# Patient Record
Sex: Female | Born: 1945 | Race: White | Hispanic: No | Marital: Married | Smoking: Former smoker
Health system: Southern US, Community
[De-identification: ages and names within clinical notes are randomized; demographics above are authoritative.]

## PROBLEM LIST (undated history)

## (undated) DIAGNOSIS — I5032 Chronic diastolic (congestive) heart failure: Secondary | ICD-10-CM

## (undated) DIAGNOSIS — K439 Ventral hernia without obstruction or gangrene: Secondary | ICD-10-CM

## (undated) DIAGNOSIS — G459 Transient cerebral ischemic attack, unspecified: Secondary | ICD-10-CM

## (undated) DIAGNOSIS — M545 Low back pain, unspecified: Secondary | ICD-10-CM

## (undated) DIAGNOSIS — E042 Nontoxic multinodular goiter: Secondary | ICD-10-CM

## (undated) DIAGNOSIS — D696 Thrombocytopenia, unspecified: Secondary | ICD-10-CM

## (undated) DIAGNOSIS — Z8719 Personal history of other diseases of the digestive system: Secondary | ICD-10-CM

## (undated) DIAGNOSIS — IMO0002 Reserved for concepts with insufficient information to code with codable children: Secondary | ICD-10-CM

## (undated) DIAGNOSIS — K219 Gastro-esophageal reflux disease without esophagitis: Secondary | ICD-10-CM

## (undated) DIAGNOSIS — I10 Essential (primary) hypertension: Secondary | ICD-10-CM

## (undated) DIAGNOSIS — E039 Hypothyroidism, unspecified: Secondary | ICD-10-CM

## (undated) DIAGNOSIS — E669 Obesity, unspecified: Secondary | ICD-10-CM

## (undated) DIAGNOSIS — J449 Chronic obstructive pulmonary disease, unspecified: Secondary | ICD-10-CM

## (undated) DIAGNOSIS — E119 Type 2 diabetes mellitus without complications: Secondary | ICD-10-CM

## (undated) DIAGNOSIS — C50911 Malignant neoplasm of unspecified site of right female breast: Secondary | ICD-10-CM

## (undated) DIAGNOSIS — S82851A Displaced trimalleolar fracture of right lower leg, initial encounter for closed fracture: Secondary | ICD-10-CM

## (undated) DIAGNOSIS — K8020 Calculus of gallbladder without cholecystitis without obstruction: Secondary | ICD-10-CM

## (undated) DIAGNOSIS — F329 Major depressive disorder, single episode, unspecified: Secondary | ICD-10-CM

## (undated) DIAGNOSIS — R251 Tremor, unspecified: Secondary | ICD-10-CM

## (undated) DIAGNOSIS — D3500 Benign neoplasm of unspecified adrenal gland: Secondary | ICD-10-CM

## (undated) DIAGNOSIS — K589 Irritable bowel syndrome without diarrhea: Secondary | ICD-10-CM

## (undated) DIAGNOSIS — G43909 Migraine, unspecified, not intractable, without status migrainosus: Secondary | ICD-10-CM

## (undated) DIAGNOSIS — E662 Morbid (severe) obesity with alveolar hypoventilation: Secondary | ICD-10-CM

## (undated) DIAGNOSIS — M797 Fibromyalgia: Secondary | ICD-10-CM

## (undated) DIAGNOSIS — M109 Gout, unspecified: Secondary | ICD-10-CM

## (undated) DIAGNOSIS — E66812 Obesity, class 2: Secondary | ICD-10-CM

## (undated) DIAGNOSIS — D649 Anemia, unspecified: Secondary | ICD-10-CM

## (undated) DIAGNOSIS — Z8701 Personal history of pneumonia (recurrent): Secondary | ICD-10-CM

## (undated) DIAGNOSIS — M1990 Unspecified osteoarthritis, unspecified site: Secondary | ICD-10-CM

## (undated) DIAGNOSIS — E785 Hyperlipidemia, unspecified: Secondary | ICD-10-CM

## (undated) DIAGNOSIS — F32A Depression, unspecified: Secondary | ICD-10-CM

## (undated) DIAGNOSIS — F419 Anxiety disorder, unspecified: Secondary | ICD-10-CM

## (undated) DIAGNOSIS — R609 Edema, unspecified: Secondary | ICD-10-CM

## (undated) DIAGNOSIS — G8929 Other chronic pain: Secondary | ICD-10-CM

## (undated) HISTORY — DX: Obesity, unspecified: E66.9

## (undated) HISTORY — DX: Major depressive disorder, single episode, unspecified: F32.9

## (undated) HISTORY — DX: Hyperlipidemia, unspecified: E78.5

## (undated) HISTORY — DX: Personal history of pneumonia (recurrent): Z87.01

## (undated) HISTORY — DX: Anxiety disorder, unspecified: F41.9

## (undated) HISTORY — PX: LUMBAR LAMINECTOMY: SHX95

## (undated) HISTORY — PX: VAGINAL HYSTERECTOMY: SUR661

## (undated) HISTORY — DX: Chronic diastolic (congestive) heart failure: I50.32

## (undated) HISTORY — DX: Morbid (severe) obesity with alveolar hypoventilation: E66.2

## (undated) HISTORY — PX: CARDIAC CATHETERIZATION: SHX172

## (undated) HISTORY — PX: FRACTURE SURGERY: SHX138

## (undated) HISTORY — DX: Hypothyroidism, unspecified: E03.9

## (undated) HISTORY — DX: Displaced trimalleolar fracture of right lower leg, initial encounter for closed fracture: S82.851A

## (undated) HISTORY — PX: BACK SURGERY: SHX140

## (undated) HISTORY — DX: Nontoxic multinodular goiter: E04.2

## (undated) HISTORY — DX: Depression, unspecified: F32.A

## (undated) HISTORY — DX: Obesity, class 2: E66.812

## (undated) HISTORY — DX: Thrombocytopenia, unspecified: D69.6

---

## 1989-03-23 HISTORY — PX: COMBINED HYSTERECTOMY VAGINAL / OOPHORECTOMY / A&P REPAIR / SACROSPINOUS LIGAMENT SUSPENSION: SUR295

## 2006-04-23 DIAGNOSIS — C50911 Malignant neoplasm of unspecified site of right female breast: Secondary | ICD-10-CM

## 2006-04-23 HISTORY — PX: BREAST LUMPECTOMY: SHX2

## 2006-04-23 HISTORY — DX: Malignant neoplasm of unspecified site of right female breast: C50.911

## 2006-04-23 HISTORY — PX: BREAST BIOPSY: SHX20

## 2011-08-22 HISTORY — PX: ORIF ANKLE FRACTURE BIMALLEOLAR: SUR920

## 2013-04-23 DIAGNOSIS — I5032 Chronic diastolic (congestive) heart failure: Secondary | ICD-10-CM

## 2013-04-23 DIAGNOSIS — D649 Anemia, unspecified: Secondary | ICD-10-CM

## 2013-04-23 HISTORY — DX: Chronic diastolic (congestive) heart failure: I50.32

## 2013-04-23 HISTORY — DX: Anemia, unspecified: D64.9

## 2013-08-21 HISTORY — PX: CATARACT EXTRACTION W/ INTRAOCULAR LENS IMPLANT: SHX1309

## 2013-09-28 ENCOUNTER — Encounter (HOSPITAL_COMMUNITY): Payer: Self-pay | Admitting: Emergency Medicine

## 2013-09-28 ENCOUNTER — Observation Stay (HOSPITAL_COMMUNITY)
Admission: EM | Admit: 2013-09-28 | Discharge: 2013-09-29 | Disposition: A | Discharge status: Home / Self Care | Payer: Medicare (Managed Care) | Attending: Internal Medicine | Admitting: Internal Medicine

## 2013-09-28 ENCOUNTER — Emergency Department (HOSPITAL_COMMUNITY): Payer: Medicare (Managed Care)

## 2013-09-28 ENCOUNTER — Inpatient Hospital Stay (HOSPITAL_COMMUNITY): Payer: Medicare (Managed Care)

## 2013-09-28 DIAGNOSIS — I10 Essential (primary) hypertension: Secondary | ICD-10-CM | POA: Insufficient documentation

## 2013-09-28 DIAGNOSIS — E119 Type 2 diabetes mellitus without complications: Secondary | ICD-10-CM

## 2013-09-28 DIAGNOSIS — Y92009 Unspecified place in unspecified non-institutional (private) residence as the place of occurrence of the external cause: Secondary | ICD-10-CM | POA: Insufficient documentation

## 2013-09-28 DIAGNOSIS — Z7982 Long term (current) use of aspirin: Secondary | ICD-10-CM | POA: Insufficient documentation

## 2013-09-28 DIAGNOSIS — M1990 Unspecified osteoarthritis, unspecified site: Secondary | ICD-10-CM | POA: Diagnosis not present

## 2013-09-28 DIAGNOSIS — W19XXXA Unspecified fall, initial encounter: Secondary | ICD-10-CM | POA: Diagnosis not present

## 2013-09-28 DIAGNOSIS — G319 Degenerative disease of nervous system, unspecified: Secondary | ICD-10-CM | POA: Insufficient documentation

## 2013-09-28 DIAGNOSIS — Z87891 Personal history of nicotine dependence: Secondary | ICD-10-CM | POA: Insufficient documentation

## 2013-09-28 DIAGNOSIS — Z7902 Long term (current) use of antithrombotics/antiplatelets: Secondary | ICD-10-CM | POA: Insufficient documentation

## 2013-09-28 DIAGNOSIS — F3289 Other specified depressive episodes: Secondary | ICD-10-CM | POA: Diagnosis not present

## 2013-09-28 DIAGNOSIS — R259 Unspecified abnormal involuntary movements: Secondary | ICD-10-CM | POA: Insufficient documentation

## 2013-09-28 DIAGNOSIS — R29898 Other symptoms and signs involving the musculoskeletal system: Principal | ICD-10-CM | POA: Insufficient documentation

## 2013-09-28 DIAGNOSIS — E785 Hyperlipidemia, unspecified: Secondary | ICD-10-CM | POA: Diagnosis not present

## 2013-09-28 DIAGNOSIS — F411 Generalized anxiety disorder: Secondary | ICD-10-CM | POA: Diagnosis not present

## 2013-09-28 DIAGNOSIS — Z9981 Dependence on supplemental oxygen: Secondary | ICD-10-CM | POA: Insufficient documentation

## 2013-09-28 DIAGNOSIS — Z853 Personal history of malignant neoplasm of breast: Secondary | ICD-10-CM | POA: Diagnosis not present

## 2013-09-28 DIAGNOSIS — K589 Irritable bowel syndrome without diarrhea: Secondary | ICD-10-CM | POA: Diagnosis not present

## 2013-09-28 DIAGNOSIS — E039 Hypothyroidism, unspecified: Secondary | ICD-10-CM | POA: Diagnosis not present

## 2013-09-28 DIAGNOSIS — IMO0002 Reserved for concepts with insufficient information to code with codable children: Secondary | ICD-10-CM | POA: Insufficient documentation

## 2013-09-28 DIAGNOSIS — G8929 Other chronic pain: Secondary | ICD-10-CM | POA: Diagnosis not present

## 2013-09-28 DIAGNOSIS — Z9181 History of falling: Secondary | ICD-10-CM | POA: Insufficient documentation

## 2013-09-28 DIAGNOSIS — R5383 Other fatigue: Secondary | ICD-10-CM | POA: Diagnosis present

## 2013-09-28 DIAGNOSIS — J449 Chronic obstructive pulmonary disease, unspecified: Secondary | ICD-10-CM | POA: Diagnosis not present

## 2013-09-28 DIAGNOSIS — IMO0001 Reserved for inherently not codable concepts without codable children: Secondary | ICD-10-CM | POA: Insufficient documentation

## 2013-09-28 DIAGNOSIS — J4489 Other specified chronic obstructive pulmonary disease: Secondary | ICD-10-CM | POA: Insufficient documentation

## 2013-09-28 DIAGNOSIS — K8020 Calculus of gallbladder without cholecystitis without obstruction: Secondary | ICD-10-CM | POA: Insufficient documentation

## 2013-09-28 DIAGNOSIS — R531 Weakness: Secondary | ICD-10-CM

## 2013-09-28 DIAGNOSIS — Z794 Long term (current) use of insulin: Secondary | ICD-10-CM | POA: Insufficient documentation

## 2013-09-28 DIAGNOSIS — M5090 Cervical disc disorder, unspecified, unspecified cervical region: Secondary | ICD-10-CM

## 2013-09-28 DIAGNOSIS — F329 Major depressive disorder, single episode, unspecified: Secondary | ICD-10-CM | POA: Diagnosis not present

## 2013-09-28 DIAGNOSIS — M797 Fibromyalgia: Secondary | ICD-10-CM

## 2013-09-28 DIAGNOSIS — R5381 Other malaise: Secondary | ICD-10-CM | POA: Insufficient documentation

## 2013-09-28 HISTORY — DX: Chronic obstructive pulmonary disease, unspecified: J44.9

## 2013-09-28 HISTORY — DX: Anemia, unspecified: D64.9

## 2013-09-28 HISTORY — DX: Other chronic pain: G89.29

## 2013-09-28 HISTORY — DX: Benign neoplasm of unspecified adrenal gland: D35.00

## 2013-09-28 HISTORY — DX: Gastro-esophageal reflux disease without esophagitis: K21.9

## 2013-09-28 HISTORY — DX: Unspecified osteoarthritis, unspecified site: M19.90

## 2013-09-28 HISTORY — DX: Low back pain: M54.5

## 2013-09-28 HISTORY — DX: Type 2 diabetes mellitus without complications: E11.9

## 2013-09-28 HISTORY — DX: Fibromyalgia: M79.7

## 2013-09-28 HISTORY — DX: Ventral hernia without obstruction or gangrene: K43.9

## 2013-09-28 HISTORY — DX: Calculus of gallbladder without cholecystitis without obstruction: K80.20

## 2013-09-28 HISTORY — DX: Low back pain, unspecified: M54.50

## 2013-09-28 HISTORY — DX: Tremor, unspecified: R25.1

## 2013-09-28 HISTORY — DX: Transient cerebral ischemic attack, unspecified: G45.9

## 2013-09-28 HISTORY — DX: Edema, unspecified: R60.9

## 2013-09-28 HISTORY — DX: Gout, unspecified: M10.9

## 2013-09-28 HISTORY — DX: Personal history of other diseases of the digestive system: Z87.19

## 2013-09-28 HISTORY — DX: Irritable bowel syndrome, unspecified: K58.9

## 2013-09-28 HISTORY — DX: Migraine, unspecified, not intractable, without status migrainosus: G43.909

## 2013-09-28 HISTORY — DX: Essential (primary) hypertension: I10

## 2013-09-28 HISTORY — DX: Reserved for concepts with insufficient information to code with codable children: IMO0002

## 2013-09-28 LAB — CBC WITH DIFFERENTIAL/PLATELET
Basophils Absolute: 0 10*3/uL (ref 0.0–0.1)
Basophils Relative: 0 % (ref 0–1)
Eosinophils Absolute: 0 10*3/uL (ref 0.0–0.7)
Eosinophils Relative: 0 % (ref 0–5)
HEMATOCRIT: 42.8 % (ref 36.0–46.0)
HEMOGLOBIN: 13.6 g/dL (ref 12.0–15.0)
LYMPHS PCT: 8 % — AB (ref 12–46)
Lymphs Abs: 1.1 10*3/uL (ref 0.7–4.0)
MCH: 26.8 pg (ref 26.0–34.0)
MCHC: 31.8 g/dL (ref 30.0–36.0)
MCV: 84.3 fL (ref 78.0–100.0)
MONO ABS: 0.7 10*3/uL (ref 0.1–1.0)
MONOS PCT: 5 % (ref 3–12)
Neutro Abs: 11.6 10*3/uL — ABNORMAL HIGH (ref 1.7–7.7)
Neutrophils Relative %: 87 % — ABNORMAL HIGH (ref 43–77)
Platelets: 159 10*3/uL (ref 150–400)
RBC: 5.08 MIL/uL (ref 3.87–5.11)
RDW: 14.4 % (ref 11.5–15.5)
WBC: 13.4 10*3/uL — ABNORMAL HIGH (ref 4.0–10.5)

## 2013-09-28 LAB — GLUCOSE, CAPILLARY
GLUCOSE-CAPILLARY: 106 mg/dL — AB (ref 70–99)
GLUCOSE-CAPILLARY: 121 mg/dL — AB (ref 70–99)

## 2013-09-28 LAB — URINALYSIS, ROUTINE W REFLEX MICROSCOPIC
Bilirubin Urine: NEGATIVE
GLUCOSE, UA: NEGATIVE mg/dL
Hgb urine dipstick: NEGATIVE
Ketones, ur: NEGATIVE mg/dL
Nitrite: NEGATIVE
PH: 5.5 (ref 5.0–8.0)
Protein, ur: NEGATIVE mg/dL
SPECIFIC GRAVITY, URINE: 1.015 (ref 1.005–1.030)
Urobilinogen, UA: 0.2 mg/dL (ref 0.0–1.0)

## 2013-09-28 LAB — COMPREHENSIVE METABOLIC PANEL
ALT: 75 U/L — ABNORMAL HIGH (ref 0–35)
AST: 46 U/L — AB (ref 0–37)
Albumin: 3.1 g/dL — ABNORMAL LOW (ref 3.5–5.2)
Alkaline Phosphatase: 205 U/L — ABNORMAL HIGH (ref 39–117)
BILIRUBIN TOTAL: 0.3 mg/dL (ref 0.3–1.2)
BUN: 44 mg/dL — AB (ref 6–23)
CHLORIDE: 97 meq/L (ref 96–112)
CO2: 26 meq/L (ref 19–32)
CREATININE: 1.17 mg/dL — AB (ref 0.50–1.10)
Calcium: 9.3 mg/dL (ref 8.4–10.5)
GFR, EST AFRICAN AMERICAN: 54 mL/min — AB (ref >90)
GFR, EST NON AFRICAN AMERICAN: 46 mL/min — AB (ref >90)
Glucose, Bld: 198 mg/dL — ABNORMAL HIGH (ref 70–99)
Potassium: 5.2 mEq/L (ref 3.7–5.3)
Sodium: 138 mEq/L (ref 137–147)
Total Protein: 6.9 g/dL (ref 6.0–8.3)

## 2013-09-28 LAB — URINE MICROSCOPIC-ADD ON

## 2013-09-28 LAB — HEMOGLOBIN A1C
Hgb A1c MFr Bld: 6.6 % — ABNORMAL HIGH (ref <5.7)
MEAN PLASMA GLUCOSE: 143 mg/dL — AB (ref <117)

## 2013-09-28 LAB — PRO B NATRIURETIC PEPTIDE: PRO B NATRI PEPTIDE: 356.8 pg/mL — AB (ref 0–125)

## 2013-09-28 LAB — TROPONIN I

## 2013-09-28 MED ORDER — HEPARIN SODIUM (PORCINE) 5000 UNIT/ML IJ SOLN
5000.0000 [IU] | Freq: Three times a day (TID) | INTRAMUSCULAR | Status: DC
  Administered 2013-09-28 – 2013-09-29 (×3): 5000 [IU] via SUBCUTANEOUS
  Filled 2013-09-28 (×5): qty 1

## 2013-09-28 MED ORDER — TRAMADOL HCL 50 MG PO TABS
100.0000 mg | ORAL_TABLET | Freq: Four times a day (QID) | ORAL | Status: DC
  Administered 2013-09-29 (×2): 100 mg via ORAL
  Filled 2013-09-28 (×3): qty 2

## 2013-09-28 MED ORDER — LISINOPRIL 10 MG PO TABS
10.0000 mg | ORAL_TABLET | Freq: Two times a day (BID) | ORAL | Status: DC
  Administered 2013-09-28 – 2013-09-29 (×2): 10 mg via ORAL
  Filled 2013-09-28 (×3): qty 1

## 2013-09-28 MED ORDER — TIOTROPIUM BROMIDE MONOHYDRATE 18 MCG IN CAPS
18.0000 ug | ORAL_CAPSULE | Freq: Every day | RESPIRATORY_TRACT | Status: DC
  Administered 2013-09-29: 18 ug via RESPIRATORY_TRACT
  Filled 2013-09-28: qty 5

## 2013-09-28 MED ORDER — ALPRAZOLAM 0.5 MG PO TABS: 0.5000 mg | ORAL_TABLET | Freq: Three times a day (TID) | ORAL | Status: DC | PRN

## 2013-09-28 MED ORDER — ATORVASTATIN CALCIUM 40 MG PO TABS
40.0000 mg | ORAL_TABLET | Freq: Every day | ORAL | Status: DC
  Administered 2013-09-28: 40 mg via ORAL
  Filled 2013-09-28 (×2): qty 1

## 2013-09-28 MED ORDER — INSULIN GLARGINE 100 UNIT/ML ~~LOC~~ SOLN
15.0000 [IU] | Freq: Every day | SUBCUTANEOUS | Status: DC
  Administered 2013-09-28: 15 [IU] via SUBCUTANEOUS
  Filled 2013-09-28 (×2): qty 0.15

## 2013-09-28 MED ORDER — MORPHINE SULFATE ER 30 MG PO TBCR
30.0000 mg | EXTENDED_RELEASE_TABLET | Freq: Two times a day (BID) | ORAL | Status: DC
  Administered 2013-09-28 – 2013-09-29 (×2): 30 mg via ORAL
  Filled 2013-09-28 (×2): qty 1

## 2013-09-28 MED ORDER — METOCLOPRAMIDE HCL 10 MG PO TABS
10.0000 mg | ORAL_TABLET | Freq: Three times a day (TID) | ORAL | Status: DC
  Administered 2013-09-28 – 2013-09-29 (×2): 10 mg via ORAL
  Filled 2013-09-28 (×4): qty 1

## 2013-09-28 MED ORDER — CLOPIDOGREL BISULFATE 75 MG PO TABS
75.0000 mg | ORAL_TABLET | Freq: Every day | ORAL | Status: DC
  Administered 2013-09-29: 75 mg via ORAL
  Filled 2013-09-28 (×2): qty 1

## 2013-09-28 MED ORDER — ASPIRIN EC 81 MG PO TBEC
81.0000 mg | DELAYED_RELEASE_TABLET | Freq: Every day | ORAL | Status: DC
  Administered 2013-09-28 – 2013-09-29 (×2): 81 mg via ORAL
  Filled 2013-09-28 (×2): qty 1

## 2013-09-28 MED ORDER — INSULIN ASPART 100 UNIT/ML ~~LOC~~ SOLN
0.0000 [IU] | Freq: Three times a day (TID) | SUBCUTANEOUS | Status: DC
Start: 2013-09-29 — End: 2013-09-29
  Administered 2013-09-29 (×2): 2 [IU] via SUBCUTANEOUS

## 2013-09-28 MED ORDER — LEVOTHYROXINE SODIUM 100 MCG PO TABS
100.0000 ug | ORAL_TABLET | Freq: Every day | ORAL | Status: DC
  Administered 2013-09-29: 100 ug via ORAL
  Filled 2013-09-28 (×2): qty 1

## 2013-09-28 MED ORDER — LETROZOLE 2.5 MG PO TABS
2.5000 mg | ORAL_TABLET | Freq: Every day | ORAL | Status: DC
  Administered 2013-09-28 – 2013-09-29 (×2): 2.5 mg via ORAL
  Filled 2013-09-28 (×2): qty 1

## 2013-09-28 MED ORDER — PREGABALIN 75 MG PO CAPS
75.0000 mg | ORAL_CAPSULE | Freq: Four times a day (QID) | ORAL | Status: DC
  Administered 2013-09-28 – 2013-09-29 (×3): 75 mg via ORAL
  Filled 2013-09-28 (×3): qty 1

## 2013-09-28 MED ORDER — FUROSEMIDE 80 MG PO TABS
160.0000 mg | ORAL_TABLET | Freq: Every morning | ORAL | Status: DC
  Administered 2013-09-29: 160 mg via ORAL
  Filled 2013-09-28: qty 2

## 2013-09-28 MED ORDER — ROPINIROLE HCL 1 MG PO TABS
2.0000 mg | ORAL_TABLET | Freq: Every day | ORAL | Status: DC
  Administered 2013-09-28: 2 mg via ORAL
  Filled 2013-09-28 (×2): qty 2

## 2013-09-28 MED ORDER — SERTRALINE HCL 50 MG PO TABS
50.0000 mg | ORAL_TABLET | Freq: Every day | ORAL | Status: DC
  Administered 2013-09-28 – 2013-09-29 (×2): 50 mg via ORAL
  Filled 2013-09-28 (×2): qty 1

## 2013-09-28 MED ORDER — SODIUM CHLORIDE 0.9 % IV SOLN
INTRAVENOUS | Status: DC
  Administered 2013-09-28: 14:00:00 via INTRAVENOUS

## 2013-09-28 NOTE — Progress Notes (Signed)
Pt admitted to the unit at 5:26pm from the ED. Pt is A&O x 4 . Pt oriented to room, staff, and call bell. Skin is intact. Full assessment charted in [facility]. Call bell within reach. Visitor guidelines reviewed w/ pt and/or family.

## 2013-09-28 NOTE — ED Provider Notes (Signed)
CSN: [id number]     Arrival date & time 09/28/13  1155 History   First MD Initiated Contact with Patient 09/28/13 1234     Chief Complaint  Patient presents with  . Weakness     (Consider location/radiation/quality/duration/timing/severity/associated sxs/prior Treatment) Patient is a 68 y.o. female presenting with weakness. The history is provided by the patient and the spouse.  Weakness   patient with acute onset of whole-body weakness that began with this morning when she awoke. Patient went to stand up and had to be helped to the ground. She denies any headache or blurred vision. No recent illnesses. She notes that she has upper extremity tremors. Denies feeling dizzy.Marland Kitchen No cough or shortness of breath. Denies any chest pain. No abdominal pain. No confusion or slurred speech noted. Patient's symptoms are persistent and nothing makes it better or worse. No treatment used prior to arrival.  Past Medical History  Diagnosis Date  . COPD (chronic obstructive pulmonary disease)   . Fibromyalgia   . Diabetes mellitus without complication   . Degenerative disc disease   . IBS (irritable bowel syndrome)   . Edema   . Osteoarthritis (arthritis due to wear and tear of joints)   . Gallstones   . Hernia of abdominal wall    Past Surgical History  Procedure Laterality Date  . Abdominal hysterectomy    . Laminectomy    . Broken ankle     History reviewed. No pertinent family history. History  Substance Use Topics  . Smoking status: Never Smoker   . Smokeless tobacco: Not on file  . Alcohol Use: No   OB History   Grav Para Term Preterm Abortions TAB SAB Ect Mult Living                 Review of Systems  Neurological: Positive for weakness.  All other systems reviewed and are negative.     Allergies  Clindamycin/lincomycin; Fentanyl; Indocin; Keflex; Penicillins; and Vancomycin  Home Medications   Prior to Admission medications   Medication Sig Start Date End Date Taking?  Authorizing Provider  ALPRAZolam Duanne Moron) 0.5 MG tablet Take 0.5 mg by mouth 3 (three) times daily as needed for anxiety or sleep.   Yes Historical Provider, MD  aspirin EC 81 MG tablet Take 81 mg by mouth daily.   Yes Historical Provider, MD  atorvastatin (LIPITOR) 40 MG tablet Take 40 mg by mouth daily.   Yes Historical Provider, MD  calcium gluconate 500 MG tablet Take 2 tablets by mouth daily.   Yes Historical Provider, MD  Cholecalciferol (VITAMIN D-3 PO) Take 1 tablet by mouth daily.   Yes Historical Provider, MD  clopidogrel (PLAVIX) 75 MG tablet Take 75 mg by mouth daily.   Yes Historical Provider, MD  ferrous sulfate 325 (65 FE) MG tablet Take 325 mg by mouth daily with breakfast.   Yes Historical Provider, MD  furosemide (LASIX) 80 MG tablet Take 160 mg by mouth every morning.   Yes Historical Provider, MD  ibuprofen (ADVIL,MOTRIN) 200 MG tablet Take 400 mg by mouth every 4 (four) hours as needed (for pain).   Yes Historical Provider, MD  insulin aspart (NOVOLOG) 100 UNIT/ML injection Inject 10 Units into the skin 3 (three) times daily before meals.   Yes Historical Provider, MD  insulin glargine (LANTUS) 100 UNIT/ML injection Inject 43 Units into the skin 2 (two) times daily.   Yes Historical Provider, MD  letrozole (FEMARA) 2.5 MG tablet Take 2.5 mg by mouth  daily.   Yes Historical Provider, MD  levothyroxine (SYNTHROID, LEVOTHROID) 100 MCG tablet Take 100 mcg by mouth daily before breakfast.   Yes Historical Provider, MD  lisinopril (PRINIVIL,ZESTRIL) 10 MG tablet Take 10 mg by mouth 2 (two) times daily.   Yes Historical Provider, MD  Magnesium Oxide 500 MG (LAX) TABS Take 500 mg by mouth 2 (two) times daily.   Yes Historical Provider, MD  metoCLOPramide (REGLAN) 10 MG tablet Take 10 mg by mouth 3 (three) times daily.   Yes Historical Provider, MD  morphine (MS CONTIN) 30 MG 12 hr tablet Take 30 mg by mouth every 12 (twelve) hours.   Yes Historical Provider, MD  Multiple Vitamin  (MULTIVITAMIN WITH MINERALS) TABS tablet Take 1 tablet by mouth daily.   Yes Historical Provider, MD  potassium chloride SA (K-DUR,KLOR-CON) 20 MEQ tablet Take 20 mEq by mouth daily.   Yes Historical Provider, MD  pregabalin (LYRICA) 75 MG capsule Take 75 mg by mouth every 6 (six) hours.   Yes Historical Provider, MD  rOPINIRole (REQUIP) 1 MG tablet Take 2 mg by mouth at bedtime.   Yes Historical Provider, MD  sertraline (ZOLOFT) 50 MG tablet Take 50 mg by mouth daily.   Yes Historical Provider, MD  tiotropium (SPIRIVA) 18 MCG inhalation capsule Place 18 mcg into inhaler and inhale daily.   Yes Historical Provider, MD  traMADol (ULTRAM) 50 MG tablet Take 100 mg by mouth every 6 (six) hours.   Yes Historical Provider, MD   BP 127/46  Pulse 95  Temp(Src) 98.2 F (36.8 C) (Oral)  Resp 13  SpO2 96% Physical Exam  Nursing note and vitals reviewed. Constitutional: She is oriented to person, place, and time. She appears well-developed and well-nourished.  Non-toxic appearance. No distress.  HENT:  Head: Normocephalic and atraumatic.  Eyes: Conjunctivae, EOM and lids are normal. Pupils are equal, round, and reactive to light.  Neck: Normal range of motion. Neck supple. No tracheal deviation present. No mass present.  Cardiovascular: Normal rate, regular rhythm and normal heart sounds.  Exam reveals no gallop.   No murmur heard. Pulmonary/Chest: Effort normal and breath sounds normal. No stridor. No respiratory distress. She has no decreased breath sounds. She has no wheezes. She has no rhonchi. She has no rales.  Abdominal: Soft. Normal appearance and bowel sounds are normal. She exhibits no distension. There is no tenderness. There is no rebound and no CVA tenderness.  Musculoskeletal: Normal range of motion. She exhibits no edema and no tenderness.  Neurological: She is alert and oriented to person, place, and time. She has normal strength. She displays tremor. No cranial nerve deficit or sensory  deficit. Coordination normal. GCS eye subscore is 4. GCS verbal subscore is 5. GCS motor subscore is 6.  Skin: Skin is warm and dry. No abrasion and no rash noted.  Psychiatric: She has a normal mood and affect. Her speech is normal and behavior is normal.    ED Course  Procedures (including critical care time) Labs Review Labs Reviewed  URINE CULTURE  URINALYSIS, ROUTINE W REFLEX MICROSCOPIC  CBC WITH DIFFERENTIAL  COMPREHENSIVE METABOLIC PANEL  TROPONIN I  PRO B NATRIURETIC PEPTIDE    Imaging Review No results found.   EKG Interpretation   Date/Time:  Monday September 28 2013 12:09:47 EDT Ventricular Rate:  91 PR Interval:  146 QRS Duration: 86 QT Interval:  356 QTC Calculation: 438 R Axis:   -6 Text Interpretation:  Sinus or ectopic atrial rhythm Left ventricular  hypertrophy Anterior Q waves, possibly due to LVH Confirmed by Zenia Resides  MD,  Clarence Cogswell ([id number]) on 09/28/2013 12:34:12 PM      MDM   Final diagnoses:  None    Patient continues to note weakness. Will be admitted by medicine for further evaluation.   Leota Jacobsen, MD 09/28/13 [phone]

## 2013-09-28 NOTE — Progress Notes (Signed)
Pt. Placed on telemetry box 6 & CCM called.  Alphonzo Lemmings, RN

## 2013-09-28 NOTE — H&P (Signed)
Internal Medicine Attending Admission Note Date: 09/28/2013  Patient name: Brooke Mccullough Medical record number: [id number] Date of birth: [birth year] Age: 68 y.o. Gender: female  I saw and evaluated the patient. I reviewed the resident's note and I agree with the resident's findings and plan as documented in the resident's note.  Chief Complaint(s): fall * 1 episode  History - key components related to admission: Patient is a 68 y/o female with PMH of fibromyalgia, COPD on home O2, DM, IBS, gout, HTN, HLD who presents with fall * 1 episode. Pt states that she fell to the ground this morning while walking to the bathroom with her walker. She denies HA. No blurry vision, no LOC, no slurred speech, no tingling/numbness. Pt does complain of weakness of bilateral hands and also LE weakness R>L.  Pt has chronic back pain that is unchanged. No fevers/chills, no n/v, no abd pain. Pt does state that she has had weakness for the past couple of months which has now worsened. Remaining ROS negative   Physical Exam - key components related to admission:  Filed Vitals:   09/28/13 1652 09/28/13 1654 09/28/13 1746 09/28/13 2210  BP: 155/51 152/59 134/71 139/72  Pulse: 91 97 90 91  Temp:   98.3 F (36.8 C) 98 F (36.7 C)  TempSrc:   Oral Oral  Resp:   18 20  Height:   5\' 4"  (1.626 m)   Weight:   221 lb 3.2 oz (100.336 kg)   SpO2:   93% 94%  Cardio- RRR, normal heart sounds Lungs- CTA b/l Abd- firm, ventral hernia +, non tender, non distended, BS + Ext- 1 + b/l LE edema Gen - AAO*3, NAD Neuro- abnormal intermittent tremors of all 4 extremities intermittently, Power 3/5 RLE, 4/5 all other extremities   Lab results:   Basic Metabolic Panel:  Recent Labs  09/28/13 1304  NA 138  K 5.2  CL 97  CO2 26  GLUCOSE 198*  BUN 44*  CREATININE 1.17*  CALCIUM 9.3   Liver Function Tests:  Recent Labs  09/28/13 1304  AST 46*  ALT 75*  ALKPHOS 205*  BILITOT 0.3  PROT 6.9  ALBUMIN 3.1*    No results found for this basename: LIPASE, AMYLASE,  in the last 72 hours No results found for this basename: AMMONIA,  in the last 72 hours CBC:  Recent Labs  09/28/13 1304  WBC 13.4*  NEUTROABS 11.6*  HGB 13.6  HCT 42.8  MCV 84.3  PLT 159   Cardiac Enzymes:  Recent Labs  09/28/13 1304  TROPONINI <0.30   BNP: No components found with this basename: POCBNP,  D-Dimer: No results found for this basename: DDIMER,  in the last 72 hours CBG:  Recent Labs  09/28/13 1830  GLUCAP 106*   Hemoglobin A1C: No results found for this basename: HGBA1C,  in the last 72 hours Fasting Lipid Panel: No results found for this basename: CHOL, HDL, LDLCALC, TRIG, CHOLHDL, LDLDIRECT,  in the last 72 hours Thyroid Function Tests: No results found for this basename: TSH, T4TOTAL, FREET4, T3FREE, THYROIDAB,  in the last 72 hours Anemia Panel: No results found for this basename: VITAMINB12, FOLATE, FERRITIN, TIBC, IRON, RETICCTPCT,  in the last 72 hours Coagulation: No results found for this basename: INR,  in the last 72 hours Alcohol Level: No results found for this basename: ETH,  in the last 72 hours  Imaging results:  Dg Chest 2 View  09/28/2013   CLINICAL DATA:  Fall with  weakness.  EXAM: CHEST  2 VIEW  COMPARISON:  None.  FINDINGS: Patient is rotated. Trachea is grossly midline. Heart is enlarged. Marked right hemidiaphragm elevation obscures the lower right chest. Left lung is grossly clear. Osseous structures appear grossly intact.  IMPRESSION: 1. No definite acute findings. 2. Markedly elevated right hemidiaphragm.   Electronically Signed   By: [name] M.D.   On: 09/28/2013 13:54   Ct Head Wo Contrast  09/28/2013   CLINICAL DATA:  Fall.  Weakness.  Tremors.  EXAM: CT HEAD WITHOUT CONTRAST  TECHNIQUE: Contiguous axial images were obtained from the base of the skull through the vertex without intravenous contrast.  COMPARISON:  None.  FINDINGS: There is no evidence of  intracranial hemorrhage, brain edema, or other signs of acute infarction. There is no evidence of intracranial mass lesion or mass effect. No abnormal extraaxial fluid collections are identified.  Mild cerebral atrophy noted. No evidence of hydrocephalus. No evidence of skull fracture or other bone lesions.  IMPRESSION: No acute intracranial abnormality.  Mild cerebral atrophy.   Electronically Signed   By: [name] M.D.   On: 09/28/2013 14:20   Ct Cervical Spine Wo Contrast  09/28/2013   CLINICAL DATA:  Fall.  Upper extremity weakness.  EXAM: CT CERVICAL SPINE WITHOUT CONTRAST  TECHNIQUE: Multidetector CT imaging of the cervical spine was performed without intravenous contrast. Multiplanar CT image reconstructions were also generated.  COMPARISON:  None.  FINDINGS: There is curvature convex to the right which could be fixed or positional. Alignment is normal in the sagittal projection. There is no evidence of fracture. There is mild spondylosis at C5-6 without gross neural compression.  There is some atherosclerotic calcification at the carotid bifurcations. The right lobe of the thyroid is enlarged compared to the left. Right-sided thyroid mass is possible. Ultrasound would be suggested as an outpatient.  IMPRESSION: No acute or traumatic finding with respect to the spine. Mild spondylosis C5-6.  Enlargement of the right lobe of the thyroid. Thyroid ultrasound recommended non emergently as an outpatient.   Electronically Signed   By: Nelson Chimes M.D.   On: 09/28/2013 20:00    Assessment & Plan by Problem:  Active Problems:   Weakness   Diabetes   Fall   Fibromyalgia   Cervical disc disease   IBS (irritable bowel syndrome)  Fall with RLE weakness - most likely secondary to deconditioning - admit to tele - Will get PT/OT eval - f/u MRI brain- r/o CVA - CT neck noted- no acute changes - continue asa, plavix, statin  DM - monitor CBG on SSI, lantus

## 2013-09-28 NOTE — Progress Notes (Signed)
Patient  Asked RN if she could receive her midnight medication now at [facility] stated that it would be ok. RN changing med now.

## 2013-09-28 NOTE — Progress Notes (Signed)
Paged Dr. Dareen Piano to let know that pt. Was on the floor and also ask about MRI order with the diet order placed at same time.  Will await for return call and continue to keep pt. NPO.  Alphonzo Lemmings, RN

## 2013-09-28 NOTE — ED Notes (Signed)
Patient woke up feeling weakness this morning. Patient reports feeling normal at 2am when she woke up, so last time reported normal.  Reports shakiness.  Patient and spouse live at [facility] an independent living center.

## 2013-09-28 NOTE — ED Notes (Signed)
On orhto vitals patient stated she could not stand only got lying andsitting

## 2013-09-28 NOTE — H&P (Signed)
Date: 09/28/2013               Patient Name:  Brooke Mccullough MRN: [id number]  DOB: 05-26-1945 Age / Sex: 68 y.o., female   PCP: No primary provider on file.         Medical Service: Internal Medicine Teaching Service         Attending Physician: Dr. Aldine Contes, MD    First Contact: Dr. Mechele Claude Pager: [phone]  Second Contact: Dr. Alice Rieger Pager: [phone]       After Hours (After 5p/  First Contact Pager: [phone]  weekends / holidays): Second Contact Pager: [phone]   Chief Complaint: generalized weakness  History of Present Illness:  This is a 68yo WF with PMH fibromyalgia, COPD on home oxygen 2.5L at night and at home prn, DM type 2, IBS, OA, DDD, depression/anxiety, gout, HTN, HLD, R breast cancer who presents with weakness since this morning at 2am when she had a GLF. Pt notes that she woke up to use the bathroom early this morning when she fell to the ground (while using her walker), denies hitting her head or LOC. States that she was very drowsy when she woke up and was tired when she fell, does not endorse any preceding s/s such as CP, SOB, palpitations. She denies tripping over anything and is unsure why she fell exactly. No slurred speech, vision changes, facial droop, numbness/tingling anywhere.  Patient notes that she has had weakness of her bilateral hands that has been worsening over the last few months, no neck pain or arm pain; no prior neck surgery. Also c/o intermittent "shaking/jerking" to all extremities that has been ongoing over the last 6 months. Pt has chronic back pain that is unchanged recently with multiple prior back surgeries. Pt also has some R>L lower extremity weakness that has been chronic and stable until this morning at 2am when she feels as though it was worse, though unclear if this was related at all to her fall. Denies abd pain, N/V/D, dysuria, fever, chills, night sweats. She has been eating and drinking normally.   Of note, pt moved here from  [place] approx 3 weeks ago and does not yet have a PCP. She walks with a walker at home at baseline.   Meds: Medications Prior to Admission  Medication Sig Dispense Refill  . ALPRAZolam (XANAX) 0.5 MG tablet Take 0.5 mg by mouth 3 (three) times daily as needed for anxiety or sleep.      Marland Kitchen aspirin EC 81 MG tablet Take 81 mg by mouth daily.      Marland Kitchen atorvastatin (LIPITOR) 40 MG tablet Take 40 mg by mouth daily.      . calcium gluconate 500 MG tablet Take 2 tablets by mouth daily.      . Cholecalciferol (VITAMIN D-3 PO) Take 1 tablet by mouth daily.      . clopidogrel (PLAVIX) 75 MG tablet Take 75 mg by mouth daily.      . ferrous sulfate 325 (65 FE) MG tablet Take 325 mg by mouth daily with breakfast.      . furosemide (LASIX) 80 MG tablet Take 160 mg by mouth every morning.      Marland Kitchen ibuprofen (ADVIL,MOTRIN) 200 MG tablet Take 400 mg by mouth every 4 (four) hours as needed (for pain).      . insulin aspart (NOVOLOG) 100 UNIT/ML injection Inject 10 Units into the skin 3 (three) times daily before meals.      . insulin  glargine (LANTUS) 100 UNIT/ML injection Inject 43 Units into the skin 2 (two) times daily.      Marland Kitchen letrozole (FEMARA) 2.5 MG tablet Take 2.5 mg by mouth daily.      Marland Kitchen levothyroxine (SYNTHROID, LEVOTHROID) 100 MCG tablet Take 100 mcg by mouth daily before breakfast.      . lisinopril (PRINIVIL,ZESTRIL) 10 MG tablet Take 10 mg by mouth 2 (two) times daily.      . Magnesium Oxide 500 MG (LAX) TABS Take 500 mg by mouth 2 (two) times daily.      . metoCLOPramide (REGLAN) 10 MG tablet Take 10 mg by mouth 3 (three) times daily.      Marland Kitchen morphine (MS CONTIN) 30 MG 12 hr tablet Take 30 mg by mouth every 12 (twelve) hours.      . Multiple Vitamin (MULTIVITAMIN WITH MINERALS) TABS tablet Take 1 tablet by mouth daily.      . potassium chloride SA (K-DUR,KLOR-CON) 20 MEQ tablet Take 20 mEq by mouth daily.      . pregabalin (LYRICA) 75 MG capsule Take 75 mg by mouth every 6 (six) hours.      Marland Kitchen  rOPINIRole (REQUIP) 1 MG tablet Take 2 mg by mouth at bedtime.      . sertraline (ZOLOFT) 50 MG tablet Take 50 mg by mouth daily.      Marland Kitchen tiotropium (SPIRIVA) 18 MCG inhalation capsule Place 18 mcg into inhaler and inhale daily.      . traMADol (ULTRAM) 50 MG tablet Take 100 mg by mouth every 6 (six) hours.        Allergies: Allergies as of 09/28/2013 - Review Complete 09/28/2013  Allergen Reaction Noted  . Clindamycin/lincomycin Rash 09/28/2013  . Fentanyl Rash 09/28/2013  . Indocin [indomethacin] Rash 09/28/2013  . Keflex [cephalexin] Rash 09/28/2013  . Penicillins Rash 09/28/2013  . Vancomycin Rash 09/28/2013   Past Medical History  Diagnosis Date  . COPD (chronic obstructive pulmonary disease)   . Fibromyalgia   . IBS (irritable bowel syndrome)   . Edema   . Gallstones   . Hernia of abdominal wall   . Hypertension   . High cholesterol   . On home oxygen therapy     "3.5L @ night and during day prn" (09/28/2013)  . Pneumonia     "once"  . Adrenal benign tumor   . Type II diabetes mellitus   . Anemia   . GERD (gastroesophageal reflux disease)   . History of duodenal ulcer   . Daily headache   . Migraine     "@ least monthly" (09/28/2013)  . TIA (transient ischemic attack) 2012; 2014  . Degenerative disc disease   . Osteoarthritis (arthritis due to wear and tear of joints)   . Arthritis     "back; knees; elbows; left ankle" (09/28/2013)  . Chronic lower back pain     "L5-S1" (09/28/2013)  . Gout   . Anxiety   . Depression   . Breast cancer     "right"   Past Surgical History  Procedure Laterality Date  . Lumbar laminectomy  1991; 1996  . Ankle fracture surgery Left 2013  . Vaginal hysterectomy  1990  . Back surgery    . Breast biopsy Right 2008  . Breast lumpectomy Right 2008  . Cataract extraction w/ intraocular lens implant Right 08/2013  . Cardiac catheterization  2000's X 2   History reviewed. No pertinent family history. History   Social History  .  Marital  Status: Married    Spouse Name: N/A    Number of Children: N/A  . Years of Education: N/A   Occupational History  . Not on file.   Social History Main Topics  . Smoking status: Former Smoker -- 1.00 packs/day for 36 years    Types: Cigarettes    Quit date: 09/09/2013  . Smokeless tobacco: Never Used  . Alcohol Use: No  . Drug Use: No  . Sexual Activity: Not Currently   Other Topics Concern  . Not on file   Social History Narrative  . No narrative on file    Review of Systems: A complete 10 point ROS was performed and other than what is noted in the HPI, all systems negative.  Physical Exam: Blood pressure 134/71, pulse 90, temperature 98.3 F (36.8 C), temperature source Oral, resp. rate 18, height 5\' 4"  (1.626 m), weight 221 lb 3.2 oz (100.336 kg), SpO2 93.00%. General: alert, cooperative, and in no apparent distress; shakes extremities intermittently  HEENT: pupils equal round and reactive to light, vision grossly intact, oropharynx clear and non-erythematous  Neck: supple Lungs: clear to ascultation bilaterally, normal work of respiration, no wheezes, rales, ronchi Heart: regular rate and rhythm, no murmurs, gallops, or rubs Abdomen: firm, large ventral hernia; non-tender, non-distended, normal bowel sounds Extremities: warm extremities b/l, 1+ pitting edema to BLE with chronic venous stasis changes to skin Neurologic: alert & oriented X3, abnormal shaking/myoclonic type movements to all extremities intermittently; cranial nerves II-XII intact, strength 3/5 RLE and 4/5 to all other extremities; grip strength weak bilaterally; sensation decreased to light touch on RLE, otherwise intact; downward going babinski bilaterally  Lab results: Basic Metabolic Panel:  Recent Labs  09/28/13 1304  NA 138  K 5.2  CL 97  CO2 26  GLUCOSE 198*  BUN 44*  CREATININE 1.17*  CALCIUM 9.3   Liver Function Tests:  Recent Labs  09/28/13 1304  AST 46*  ALT 75*  ALKPHOS  205*  BILITOT 0.3  PROT 6.9  ALBUMIN 3.1*   CBC:  Recent Labs  09/28/13 1304  WBC 13.4*  NEUTROABS 11.6*  HGB 13.6  HCT 42.8  MCV 84.3  PLT 159   Cardiac Enzymes:  Recent Labs  09/28/13 1304  TROPONINI <0.30   BNP:  Recent Labs  09/28/13 1304  PROBNP 356.8*   Urinalysis:  Recent Labs  09/28/13 1220  COLORURINE YELLOW  LABSPEC 1.015  PHURINE 5.5  GLUCOSEU NEGATIVE  HGBUR NEGATIVE  BILIRUBINUR NEGATIVE  KETONESUR NEGATIVE  PROTEINUR NEGATIVE  UROBILINOGEN 0.2  NITRITE NEGATIVE  LEUKOCYTESUR TRACE*   Imaging results:  Dg Chest 2 View  09/28/2013   CLINICAL DATA:  Fall with weakness.  EXAM: CHEST  2 VIEW  COMPARISON:  None.  FINDINGS: Patient is rotated. Trachea is grossly midline. Heart is enlarged. Marked right hemidiaphragm elevation obscures the lower right chest. Left lung is grossly clear. Osseous structures appear grossly intact.  IMPRESSION: 1. No definite acute findings. 2. Markedly elevated right hemidiaphragm.   Electronically Signed   By: [name] M.D.   On: 09/28/2013 13:54   Ct Head Wo Contrast  09/28/2013   CLINICAL DATA:  Fall.  Weakness.  Tremors.  EXAM: CT HEAD WITHOUT CONTRAST  TECHNIQUE: Contiguous axial images were obtained from the base of the skull through the vertex without intravenous contrast.  COMPARISON:  None.  FINDINGS: There is no evidence of intracranial hemorrhage, brain edema, or other signs of acute infarction. There is no evidence of intracranial  mass lesion or mass effect. No abnormal extraaxial fluid collections are identified.  Mild cerebral atrophy noted. No evidence of hydrocephalus. No evidence of skull fracture or other bone lesions.  IMPRESSION: No acute intracranial abnormality.  Mild cerebral atrophy.   Electronically Signed   By: [name] M.D.   On: 09/28/2013 14:20   Other results: EKG: NSR, LVH, Q waves in anterior leads  Assessment & Plan by Problem:  # RLE weakness in the setting of chronic BLE weakness  and acute GLF: Patient with some generalized weakness as well as some R>L LE weakness that is worse recently. No preceding s/s associated with the fall to indicate etiology. Pt with mild leukocytosis of 13.4, though she is afebrile with no clear nidus for infection, so infectious etiology less likely. Of note, head CT is negative. UA negative. CXR shows elevated R hemidiaphragm, but no acute process. Patient received approx 500cc NS in the ED. DDX CVA (pt with h/o TIA), physical deconditioning, orthostatic hypotension, drowsiness, vasovagal, arrhythmia. Less likely ACS given Trop negative x 1, EKG shows no acute ischemic change (though no prior EKG to compare with), no chest pain or SOB. I suspect patient is very deconditioned, which is contributing to many of her weakness issues.  -admit to telemetry  -orthostatic vitals -bed rest -PT/OT given likely deconditioning  -MRI brain to r/o CVA -NPO until bedside nursing swallow eval  -continue home ASA, plavix, statin -CMP, TSH, CBC in am  # Chronic bilateral hand weakness, possible cervical radiculopathy: Patient with chronic bilateral hand weakness. No neck pain or arm pain to indicate radiculopathy. Sensation to bilateral UE intact. Although s/s atypical for radiculopathy, given the chronicity of her hand weakness we will evaluate this with further imaging of her neck. -neck CT  # DM type 2: Unknown A1c. Patient is on lantus 43U BID and aspart 10U TID WC at home. Also on lyrica 75mg  TID for likely peripheral neuropathy and reglan for likely gastroparesis (though patient has not been here in the past so her PMH is sparse). -continue lyrica and reglan -SSI, sensitive -lantus 15U qHS  # HTN: Stable, normotensive. Patient is on lisinopril 10mg  BID, which we will continue.  # Fibromyalgia/Chronic pain: Patient's pain overall appears to be stable. Will continue home medication regimen.  -continue home MS contin 30mg  BID, ultram 100mg  q6h   # Mental  health: Patient with hx of anxiety/depression. On xanax, zoloft at home. Will continue his home medications.  #COPD: Patient's breathing is stable and at baseline. Apparently she uses oxygen at night (3.5LPM) and as needed at home. No concern for acute exacerbation at this time. -continue home spiriva   # H/o breast cancer on Right: Continue home letrozole.  # Hypothyroidism: Unknown last TSH. Continue home synthroid 131mcg daily. -check TSH  # BLE edema: Patient with chronic BLE edema with chronic venous stasis change. On lasix 160mg  daily. -continue home lasix  # VTE: heparin  # Diet: NPO until passes bedside swalling  Code status: full code  Dispo: Disposition is deferred at this time, awaiting improvement of current medical problems. Anticipated discharge in approximately 1-2 day(s).   The patient does not have a current PCP (No primary provider on file.) and does not need an [facility] hospital follow-up appointment after discharge.  The patient does not have transportation limitations that hinder transportation to clinic appointments.  Signed: Rebecca Eaton, MD 09/28/2013, 6:14 PM

## 2013-09-29 DIAGNOSIS — F341 Dysthymic disorder: Secondary | ICD-10-CM

## 2013-09-29 DIAGNOSIS — I10 Essential (primary) hypertension: Secondary | ICD-10-CM

## 2013-09-29 DIAGNOSIS — IMO0001 Reserved for inherently not codable concepts without codable children: Secondary | ICD-10-CM

## 2013-09-29 DIAGNOSIS — R5383 Other fatigue: Secondary | ICD-10-CM

## 2013-09-29 DIAGNOSIS — Z853 Personal history of malignant neoplasm of breast: Secondary | ICD-10-CM

## 2013-09-29 DIAGNOSIS — E119 Type 2 diabetes mellitus without complications: Secondary | ICD-10-CM

## 2013-09-29 DIAGNOSIS — E039 Hypothyroidism, unspecified: Secondary | ICD-10-CM

## 2013-09-29 DIAGNOSIS — J449 Chronic obstructive pulmonary disease, unspecified: Secondary | ICD-10-CM

## 2013-09-29 DIAGNOSIS — G8929 Other chronic pain: Secondary | ICD-10-CM

## 2013-09-29 DIAGNOSIS — R5381 Other malaise: Secondary | ICD-10-CM

## 2013-09-29 DIAGNOSIS — R609 Edema, unspecified: Secondary | ICD-10-CM

## 2013-09-29 DIAGNOSIS — M5090 Cervical disc disorder, unspecified, unspecified cervical region: Secondary | ICD-10-CM

## 2013-09-29 LAB — COMPREHENSIVE METABOLIC PANEL
ALT: 83 U/L — AB (ref 0–35)
AST: 58 U/L — ABNORMAL HIGH (ref 0–37)
Albumin: 3.2 g/dL — ABNORMAL LOW (ref 3.5–5.2)
Alkaline Phosphatase: 220 U/L — ABNORMAL HIGH (ref 39–117)
BUN: 42 mg/dL — ABNORMAL HIGH (ref 6–23)
CALCIUM: 9.9 mg/dL (ref 8.4–10.5)
CO2: 29 mEq/L (ref 19–32)
Chloride: 100 mEq/L (ref 96–112)
Creatinine, Ser: 1.02 mg/dL (ref 0.50–1.10)
GFR calc Af Amer: 64 mL/min — ABNORMAL LOW (ref >90)
GFR calc non Af Amer: 55 mL/min — ABNORMAL LOW (ref >90)
GLUCOSE: 156 mg/dL — AB (ref 70–99)
Potassium: 4.4 mEq/L (ref 3.7–5.3)
SODIUM: 141 meq/L (ref 137–147)
TOTAL PROTEIN: 6.8 g/dL (ref 6.0–8.3)
Total Bilirubin: 0.2 mg/dL — ABNORMAL LOW (ref 0.3–1.2)

## 2013-09-29 LAB — CBC
HCT: 40.3 % (ref 36.0–46.0)
Hemoglobin: 12.7 g/dL (ref 12.0–15.0)
MCH: 26.3 pg (ref 26.0–34.0)
MCHC: 31.5 g/dL (ref 30.0–36.0)
MCV: 83.4 fL (ref 78.0–100.0)
Platelets: 131 K/uL — ABNORMAL LOW (ref 150–400)
RBC: 4.83 MIL/uL (ref 3.87–5.11)
RDW: 14.5 % (ref 11.5–15.5)
WBC: 9.7 K/uL (ref 4.0–10.5)

## 2013-09-29 LAB — HEMOGLOBIN A1C
Hgb A1c MFr Bld: 6.8 % — ABNORMAL HIGH (ref <5.7)
Mean Plasma Glucose: 148 mg/dL — ABNORMAL HIGH (ref <117)

## 2013-09-29 LAB — TSH: TSH: 1.1 u[IU]/mL (ref 0.350–4.500)

## 2013-09-29 LAB — GLUCOSE, CAPILLARY
GLUCOSE-CAPILLARY: 162 mg/dL — AB (ref 70–99)
Glucose-Capillary: 195 mg/dL — ABNORMAL HIGH (ref 70–99)

## 2013-09-29 MED ORDER — TRAMADOL HCL 50 MG PO TABS
100.0000 mg | ORAL_TABLET | Freq: Once | ORAL | Status: AC
  Administered 2013-09-29: 100 mg via ORAL

## 2013-09-29 NOTE — Progress Notes (Signed)
Verbal order to give b/p medication early this morning because b/p was 175/75 after recheck. Day shift nurse will be notified

## 2013-09-29 NOTE — Evaluation (Signed)
Occupational Therapy Evaluation Patient Details Name: Brooke Mccullough MRN: [id number] DOB: [birth year] Today's Date: 09/29/2013    History of Present Illness Pt admitted with weakness and fall.  PMH:  fibromyalgia, back pain, COPD with 02 dependence, DM, gout,IBS.   Clinical Impression   Pt was performing ADL at a modified independent level PTA and now is requires supervision for standing activities and ADL transfers.  Husband is capable of supervising at home until pt has fully returned to her baseline.  No further OT.    Follow Up Recommendations  No OT follow up    Equipment Recommendations  None recommended by OT    Recommendations for Other Services       Precautions / Restrictions Precautions Precautions: Fall Precaution Comments: hx weakness in hands Restrictions Weight Bearing Restrictions: No      Mobility Bed Mobility Overal bed mobility: Modified Independent                Transfers Overall transfer level: Needs assistance Equipment used: Rolling walker (2 wheeled) Transfers: Sit to/from Stand Sit to Stand: Modified independent (Device/Increase time)              Balance                                            ADL Overall ADL's : Pt requires supervision for toilet and shower transfers, otherwise at baseline.                                       General ADL Comments: Pt is able to access her feet for bathing and dressing.  She is knowledgeable in energy conservation and purse lip breathing.     Vision                     Perception     Praxis      Pertinent Vitals/Pain No pain, VSS     Hand Dominance Right   Extremity/Trunk Assessment Upper Extremity Assessment Upper Extremity Assessment: Overall WFL for tasks assessed   Lower Extremity Assessment Lower Extremity Assessment: Defer to PT evaluation       Communication Communication Communication: No difficulties   Cognition  Arousal/Alertness: Awake/alert Behavior During Therapy: WFL for tasks assessed/performed Overall Cognitive Status: Within Functional Limits for tasks assessed                     General Comments       Exercises       Shoulder Instructions      Home Living Family/patient expects to be discharged to:: Private residence Living Arrangements: Spouse/significant other Available Help at Discharge: Family;Available 24 hours/day Type of Home: Apartment       Home Layout: One level     Bathroom Shower/Tub: Occupational psychologist: Handicapped height     Home Equipment: Walker - 2 wheels;Grab bars - toilet;Grab bars - tub/shower;Shower seat - built in;Wheelchair - manual   Additional Comments: lives at [employer] with husband (Independent Level)      Prior Functioning/Environment Level of Independence: Needs assistance  Gait / Transfers Assistance Needed: Ambulates with RW ADL's / Homemaking Assistance Needed: goes to dining room for meals, husband does laundry   Comments: pt has walk in shower  with built in seat and handicap bars around toilet and shower with elevated toilet    OT Diagnosis:     OT Problem List:     OT Treatment/Interventions:      OT Goals(Current goals can be found in the care plan section)    OT Frequency:     Barriers to D/C:            Co-evaluation              End of Session Equipment Utilized During Treatment: Rolling walker  Activity Tolerance: Patient tolerated treatment well Patient left: in bed;with call bell/phone within reach;with family/visitor present   Time: 4401-0272 OT Time Calculation (min): 21 min Charges:  OT General Charges $OT Visit: 1 Procedure OT Evaluation $Initial OT Evaluation Tier I: 1 Procedure G-Codes:    Brooke Mccullough Brooke Mccullough 10-Oct-2013, 1:36 PM [phone]

## 2013-09-29 NOTE — Progress Notes (Signed)
Patient discharge teaching given, including activity, diet, follow-up appoints, and medications. Patient verbalized understanding of all discharge instructions. IV access was d/c'd. Vitals are stable. Skin is intact except as charted in most recent assessments. Pt to be escorted out by NT, to be driven home by family. 

## 2013-09-29 NOTE — Discharge Instructions (Signed)
Weakness  Weakness is a lack of strength. It may be felt all over the body (generalized) or in one specific part of the body (focal). Some causes of weakness can be serious. You may need further medical evaluation, especially if you are elderly or you have a history of immunosuppression (such as chemotherapy or HIV), kidney disease, heart disease, or diabetes.  CAUSES   Weakness can be caused by many different things, including:  · Infection.  · Physical exhaustion.  · Internal bleeding or other blood loss that results in a lack of red blood cells (anemia).  · Dehydration. This cause is more common in elderly people.  · Side effects or electrolyte abnormalities from medicines, such as pain medicines or sedatives.  · Emotional distress, anxiety, or depression.  · Circulation problems, especially severe peripheral arterial disease.  · Heart disease, such as rapid atrial fibrillation, bradycardia, or heart failure.  · Nervous system disorders, such as Guillain-Barré syndrome, multiple sclerosis, or stroke.  DIAGNOSIS   To find the cause of your weakness, your caregiver will take your history and perform a physical exam. Lab tests or X-rays may also be ordered, if needed.  TREATMENT   Treatment of weakness depends on the cause of your symptoms and can vary greatly.  HOME CARE INSTRUCTIONS   · Rest as needed.  · Eat a well-balanced diet.  · Try to get some exercise every day.  · Only take over-the-counter or prescription medicines as directed by your caregiver.  SEEK MEDICAL CARE IF:   · Your weakness seems to be getting worse or spreads to other parts of your body.  · You develop new aches or pains.  SEEK IMMEDIATE MEDICAL CARE IF:   · You cannot perform your normal daily activities, such as getting dressed and feeding yourself.  · You cannot walk up and down stairs, or you feel exhausted when you do so.  · You have shortness of breath or chest pain.  · You have difficulty moving parts of your body.  · You have weakness  in only one area of the body or on only one side of the body.  · You have a fever.  · You have trouble speaking or swallowing.  · You cannot control your bladder or bowel movements.  · You have black or bloody vomit or stools.  MAKE SURE YOU:  · Understand these instructions.  · Will watch your condition.  · Will get help right away if you are not doing well or get worse.  Document Released: 04/09/2005 Document Revised: 10/09/2011 Document Reviewed: 06/08/2011  [employer]® Patient Information ©2014 [employer].

## 2013-09-29 NOTE — Progress Notes (Addendum)
Pt seen and examined. Pt complains of R knee pain but improved weakness. No fevers, no lightheadedness  Physical exam: Cardio- RRR, normal heart sounds Lungs- CTA b/l Abd - soft, non tender, non distended, BS+ Ext -  1 + LE edema b/l Gen- AAO*3, NAD  Assessment and Plan: I have reviewed Dr. Renaldo Fiddler note and agree with findings   Fall with RLE weakness; - f/u PT/OT eval - Will cancel MRI head- unlikely CVA - c/w asa, plavix, statin - CT neck, head with no acute abnormalities  Accelerated HTN - c/w lasix, lisinopril - may need to increase lisinopril to 20 mg  DM - CBG well controlled - A1C noted. D/c home on home regimen  Increased LFTs - pt with mildly elevated ALP and transaminases.  - Uncertain etiology. Will need repeat LFTs as an outpatient - If persistently elevated would check RUQ sono and hepatitis panel   Pt stable for d/c home today pending PT eval

## 2013-09-29 NOTE — Discharge Summary (Signed)
Name: Brooke Mccullough MRN: [id number] DOB: Feb 13, 1946 68 y.o. PCP: Unk Pinto, MD  Date of Admission: 09/28/2013 11:55 AM Date of Discharge: 09/29/2013 Attending Physician: Aldine Contes, MD  Discharge Diagnosis: Active Problems:   Weakness- likely 2/2 deconditioning    Diabetes type 2, controlled    Fall- GLF, no resulting injuries    Fibromyalgia   Cervical disc disease,chronic    IBS (irritable bowel syndrome)  Discharge Medications:   Medication List         ALPRAZolam 0.5 MG tablet  Commonly known as:  XANAX  Take 0.5 mg by mouth 3 (three) times daily as needed for anxiety or sleep.     aspirin EC 81 MG tablet  Take 81 mg by mouth daily.     atorvastatin 40 MG tablet  Commonly known as:  LIPITOR  Take 40 mg by mouth daily.     calcium gluconate 500 MG tablet  Take 2 tablets by mouth daily.     clopidogrel 75 MG tablet  Commonly known as:  PLAVIX  Take 75 mg by mouth daily.     ferrous sulfate 325 (65 FE) MG tablet  Take 325 mg by mouth daily with breakfast.     furosemide 80 MG tablet  Commonly known as:  LASIX  Take 160 mg by mouth every morning.     ibuprofen 200 MG tablet  Commonly known as:  ADVIL,MOTRIN  Take 400 mg by mouth every 4 (four) hours as needed (for pain).     insulin aspart 100 UNIT/ML injection  Commonly known as:  novoLOG  Inject 10 Units into the skin 3 (three) times daily before meals.     insulin glargine 100 UNIT/ML injection  Commonly known as:  LANTUS  Inject 43 Units into the skin 2 (two) times daily.     letrozole 2.5 MG tablet  Commonly known as:  FEMARA  Take 2.5 mg by mouth daily.     levothyroxine 100 MCG tablet  Commonly known as:  SYNTHROID, LEVOTHROID  Take 100 mcg by mouth daily before breakfast.     lisinopril 10 MG tablet  Commonly known as:  PRINIVIL,ZESTRIL  Take 10 mg by mouth 2 (two) times daily.     Magnesium Oxide 500 MG (LAX) Tabs  Take 500 mg by mouth 2 (two) times daily.     metoCLOPramide 10 MG tablet  Commonly known as:  REGLAN  Take 10 mg by mouth 3 (three) times daily.     morphine 30 MG 12 hr tablet  Commonly known as:  MS CONTIN  Take 30 mg by mouth every 12 (twelve) hours.     multivitamin with minerals Tabs tablet  Take 1 tablet by mouth daily.     potassium chloride SA 20 MEQ tablet  Commonly known as:  K-DUR,KLOR-CON  Take 20 mEq by mouth daily.     pregabalin 75 MG capsule  Commonly known as:  LYRICA  Take 75 mg by mouth every 6 (six) hours.     rOPINIRole 1 MG tablet  Commonly known as:  REQUIP  Take 2 mg by mouth at bedtime.     sertraline 50 MG tablet  Commonly known as:  ZOLOFT  Take 50 mg by mouth daily.     tiotropium 18 MCG inhalation capsule  Commonly known as:  SPIRIVA  Place 18 mcg into inhaler and inhale daily.     traMADol 50 MG tablet  Commonly known as:  ULTRAM  Take 100 mg by  mouth every 6 (six) hours.     VITAMIN D-3 PO  Take 1 tablet by mouth daily.       Disposition and follow-up:   Ms.Brooke Mccullough was discharged from [facility] in Stable condition.  At the hospital follow up visit please address:  1.  R lobe thyroid enlargement- seen on neck CT scan; pt will need outpatient [place] to follow up 2.  Did [facility] contact her?  2.  Labs / imaging needed at time of follow-up: LFTs (slightly elevated this admission)  3.  Pending labs/ test needing follow-up: none  Follow-up Appointments: Follow-up Information   Follow up with Alesia Richards, MD On 09/30/2013. (as previously scheduled )    Specialty:  Internal Medicine   Contact information:   [address] [place] [address] [phone]       Discharge Instructions:  Consultations:  none  Procedures Performed:  Dg Chest 2 View  09/28/2013   CLINICAL DATA:  Fall with weakness.  EXAM: CHEST  2 VIEW  COMPARISON:  None.  FINDINGS: Patient is rotated. Trachea is grossly midline. Heart is enlarged. Marked  right hemidiaphragm elevation obscures the lower right chest. Left lung is grossly clear. Osseous structures appear grossly intact.  IMPRESSION: 1. No definite acute findings. 2. Markedly elevated right hemidiaphragm.   Electronically Signed   By: [name] M.D.   On: 09/28/2013 13:54   Ct Head Wo Contrast  09/28/2013   CLINICAL DATA:  Fall.  Weakness.  Tremors.  EXAM: CT HEAD WITHOUT CONTRAST  TECHNIQUE: Contiguous axial images were obtained from the base of the skull through the vertex without intravenous contrast.  COMPARISON:  None.  FINDINGS: There is no evidence of intracranial hemorrhage, brain edema, or other signs of acute infarction. There is no evidence of intracranial mass lesion or mass effect. No abnormal extraaxial fluid collections are identified.  Mild cerebral atrophy noted. No evidence of hydrocephalus. No evidence of skull fracture or other bone lesions.  IMPRESSION: No acute intracranial abnormality.  Mild cerebral atrophy.   Electronically Signed   By: [name] M.D.   On: 09/28/2013 14:20   Ct Cervical Spine Wo Contrast  09/28/2013   CLINICAL DATA:  Fall.  Upper extremity weakness.  EXAM: CT CERVICAL SPINE WITHOUT CONTRAST  TECHNIQUE: Multidetector CT imaging of the cervical spine was performed without intravenous contrast. Multiplanar CT image reconstructions were also generated.  COMPARISON:  None.  FINDINGS: There is curvature convex to the right which could be fixed or positional. Alignment is normal in the sagittal projection. There is no evidence of fracture. There is mild spondylosis at C5-6 without gross neural compression.  There is some atherosclerotic calcification at the carotid bifurcations. The right lobe of the thyroid is enlarged compared to the left. Right-sided thyroid mass is possible. Ultrasound would be suggested as an outpatient.  IMPRESSION: No acute or traumatic finding with respect to the spine. Mild spondylosis C5-6.  Enlargement of the right lobe of the  thyroid. Thyroid ultrasound recommended non emergently as an outpatient.   Electronically Signed   By: Nelson Chimes M.D.   On: 09/28/2013 20:00   Admission HPI:  This is a 68yo WF with PMH fibromyalgia, COPD on home oxygen 2.5L at night and at home prn, DM type 2, IBS, OA, DDD, depression/anxiety, gout, HTN, HLD, R breast cancer who presents with weakness since this morning at 2am when she had a GLF. Pt notes that she woke up to  use the bathroom early this morning when she fell to the ground (while using her walker), denies hitting her head or LOC. States that she was very drowsy when she woke up and was tired when she fell, does not endorse any preceding s/s such as CP, SOB, palpitations. She denies tripping over anything and is unsure why she fell exactly. No slurred speech, vision changes, facial droop, numbness/tingling anywhere.  Patient notes that she has had weakness of her bilateral hands that has been worsening over the last few months, no neck pain or arm pain; no prior neck surgery. Also c/o intermittent "shaking/jerking" to all extremities that has been ongoing over the last 6 months. Pt has chronic back pain that is unchanged recently with multiple prior back surgeries. Pt also has some R>L lower extremity weakness that has been chronic and stable until this morning at 2am when she feels as though it was worse, though unclear if this was related at all to her fall. Denies abd pain, N/V/D, dysuria, fever, chills, night sweats. She has been eating and drinking normally.  Of note, pt moved here from [place] approx 3 weeks ago and does not yet have a PCP. She walks with a walker at home at baseline.   Hospital Course by problem list:   # RLE weakness in the setting of chronic BLE weakness and acute GLF: Patient with some generalized weakness as well as some R>L LE weakness that is worse recently. No preceding s/s associated with the fall to indicate etiology. Pt with mild leukocytosis of 13.4 on  admission resolved overnight. VS remained stable, afebrile. Head CT negative on admission. UA negative.Trop negative x 1, EKG shows no acute ischemic change (though no prior EKG to compare with). TSH 1.1. CXR showed elevated R hemidiaphragm, but no acute process. Orthostatics negative. On exam, patient with generalized weakness with perhaps some increased weakness of RLE compared to L, though patient notes this is baseline. MRI head was scheduled, but deemed unnecessary on day of discharge given her symptoms had completely resolved and she had no other s/s of stroke, so this was canceled. On day of discharge patient felt much improved and was feeling at her baseline. I suspect patient is very deconditioned, which is contributing to many of her weakness issues. I suspect her fall yesterday was 2/2 being drowsy since it occurred right when she woke up at 2AM and she had taken a xanax prior to going to bed. Of note, patient apparently has chronic intermittent jerking which was present on admission, but had resolved on AM of discharge. PT saw patient and recommended HHPT, which was set up for patient prior to discharge. Patient already has f/u appointment to establish with a new PCP (Dr. Anitra Lauth) tomorrow, which she will attend.  # Chronic bilateral hand weakness, possible cervical radiculopathy: Patient with chronic bilateral hand weakness x 6 months. No neck pain or arm pain to indicate radiculopathy. Sensation to bilateral UE intact. Although s/s atypical for radiculopathy, given the chronicity of her hand weakness we evaluated this with further imaging of her neck w/ neck CT, which showed mild spondylosis C5-6, no neural impingement.  # DM type 2: A1c 6.6% this admission. Patient is on lantus 43U BID and aspart 10U TID WC at home. Also on lyrica 75mg  TID for likely peripheral neuropathy and reglan for likely gastroparesis (though patient has not been here in the past so her PMH is sparse). We continued home  lyrica and reglan. Patient was managed on  lantus 15U qHS and SSI. Pt with establish with new PCP tomorrow as previously scheduled. She was discharged on her home regimen.   # HTN: Stable, normotensive on admission, though slightly hypertensive (170s/80s) prior to discharge. Patient is on lisinopril 10mg  BID as lasix 160mg  daily, which we continued. Will need to establish care with PCP as previously mentioned.   # Fibromyalgia/Chronic pain: Patient's pain overall appears to be stable. Continue home medication regimen (MS contin 30mg  BID, ultram 100mg  q6h).  # Mental health: Patient with hx of anxiety/depression. On xanax, zoloft at home. Continues her home medications.   #COPD: Patient's breathing is stable and at baseline. Apparently she uses oxygen at night (3.5LPM) and as needed at home. No concern for acute exacerbation at this time. Continue home spiriva.  # H/o breast cancer on Right: Continued home letrozole.   # Hypothyroidism: TSH this admission 1.1. Continued home synthroid 148mcg daily. Neck CT showed incidental enlargement of R lobe of her thyroid, which will need outpatient f/u with [place].   # BLE edema: Patient with chronic BLE edema with chronic venous stasis change. On lasix 160mg  daily. Continued home lasix.  Discharge Vitals:   BP 179/77  Pulse 92  Temp(Src) 98.1 F (36.7 C) (Oral)  Resp 18  Ht 5\' 4"  (1.626 m)  Wt 221 lb 3.2 oz (100.336 kg)  BMI 37.95 kg/m2  SpO2 96%  Discharge Labs:  Results for orders placed during the hospital encounter of 09/28/13 (from the past 24 hour(s))  GLUCOSE, CAPILLARY     Status: Abnormal   Collection Time    09/28/13  6:30 PM      Result Value Ref Range   Glucose-Capillary 106 (*) 70 - 99 mg/dL  GLUCOSE, CAPILLARY     Status: Abnormal   Collection Time    09/28/13 10:06 PM      Result Value Ref Range   Glucose-Capillary 121 (*) 70 - 99 mg/dL   Comment 1 Notify RN     Comment 2 Documented in Chart    CBC     Status: Abnormal    Collection Time    09/29/13  5:27 AM      Result Value Ref Range   WBC 9.7  4.0 - 10.5 K/uL   RBC 4.83  3.87 - 5.11 MIL/uL   Hemoglobin 12.7  12.0 - 15.0 g/dL   HCT 40.3  36.0 - 46.0 %   MCV 83.4  78.0 - 100.0 fL   MCH 26.3  26.0 - 34.0 pg   MCHC 31.5  30.0 - 36.0 g/dL   RDW 14.5  11.5 - 15.5 %   Platelets 131 (*) 150 - 400 K/uL  COMPREHENSIVE METABOLIC PANEL     Status: Abnormal   Collection Time    09/29/13  5:27 AM      Result Value Ref Range   Sodium 141  137 - 147 mEq/L   Potassium 4.4  3.7 - 5.3 mEq/L   Chloride 100  96 - 112 mEq/L   CO2 29  19 - 32 mEq/L   Glucose, Bld 156 (*) 70 - 99 mg/dL   BUN 42 (*) 6 - 23 mg/dL   Creatinine, Ser 1.02  0.50 - 1.10 mg/dL   Calcium 9.9  8.4 - 10.5 mg/dL   Total Protein 6.8  6.0 - 8.3 g/dL   Albumin 3.2 (*) 3.5 - 5.2 g/dL   AST 58 (*) 0 - 37 U/L   ALT 83 (*) 0 - 35 U/L  Alkaline Phosphatase 220 (*) 39 - 117 U/L   Total Bilirubin 0.2 (*) 0.3 - 1.2 mg/dL   GFR calc non Af Amer 55 (*) >90 mL/min   GFR calc Af Amer 64 (*) >90 mL/min  TSH     Status: None   Collection Time    09/29/13  5:27 AM      Result Value Ref Range   TSH 1.100  0.350 - 4.500 uIU/mL  GLUCOSE, CAPILLARY     Status: Abnormal   Collection Time    09/29/13  8:20 AM      Result Value Ref Range   Glucose-Capillary 162 (*) 70 - 99 mg/dL  GLUCOSE, CAPILLARY     Status: Abnormal   Collection Time    09/29/13 12:13 PM      Result Value Ref Range   Glucose-Capillary 195 (*) 70 - 99 mg/dL    Signed: Rebecca Eaton, MD 09/29/2013, 1:17 PM   Time Spent on Discharge: 35 minutes Services Ordered on Discharge: [place] Ordered on Discharge: none

## 2013-09-29 NOTE — Progress Notes (Signed)
Patient's b/p is high this am- MD on call notified. Awaiting any further orders

## 2013-09-29 NOTE — Evaluation (Addendum)
Physical Therapy Evaluation Patient Details Name: Brooke Mccullough MRN: [id number] DOB: 12-08-1945 Today's Date: 09/29/2013   History of Present Illness  Pt admitted with weakness and fall.  PMH:  fibromyalgia, back pain, COPD with 02 dependence, DM, gout,IBS.  Clinical Impression  Pt adm from home due to the above. Presents with deconditioning and weakness. Pt c/o intermittent pain in Rt knee. Pt to benefit from skilled acute PT to address deficits indicated below. Will recommend HHPT upon return home with husband.     Follow Up Recommendations Home health PT;Supervision/Assistance - 24 hour    Equipment Recommendations  None recommended by PT    Recommendations for Other Services       Precautions / Restrictions Precautions Precautions: Fall Precaution Comments: hx weakness in hands Restrictions Weight Bearing Restrictions: No      Mobility  Bed Mobility Overal bed mobility: Modified Independent                Transfers Overall transfer level: Modified independent Equipment used: Rolling walker (2 wheeled) Transfers: Sit to/from Stand Sit to Stand: Modified independent (Device/Increase time)         General transfer comment: use of armrests for sit to stand; no LOB noted  Ambulation/Gait Ambulation/Gait assistance: Supervision Ambulation Distance (Feet): 80 Feet Assistive device: Rolling walker (2 wheeled) Gait Pattern/deviations: Step-through pattern;Wide base of support;Trunk flexed;Decreased stride length Gait velocity: decreased Gait velocity interpretation: Below normal speed for age/gender General Gait Details: cues for upright posture with gt; pt limited due to deconditioning and fatigue; no LOB noted; pt reports shes at her baseline   Stairs            Wheelchair Mobility    Modified Rankin (Stroke Patients Only)       Balance Overall balance assessment: History of Falls;No apparent balance deficits (not formally assessed)                                           Pertinent Vitals/Pain C/o intermittent pain in Rt knee    Home Living Family/patient expects to be discharged to:: Private residence Living Arrangements: Spouse/significant other Available Help at Discharge: Family;Available 24 hours/day Type of Home: Apartment Home Access: Level entry     Home Layout: One level Home Equipment: Walker - 2 wheels;Grab bars - toilet;Grab bars - tub/shower;Shower seat - built in;Wheelchair - manual Additional Comments: lives at [employer] with husband (Independent Level)    Prior Function Level of Independence: Needs assistance   Gait / Transfers Assistance Needed: Ambulates with RW  ADL's / Homemaking Assistance Needed: goes to dining room for meals, husband does laundry  Comments: pt has walk in shower with built in seat and handicap bars around toilet and shower with elevated toilet     Hand Dominance   Dominant Hand: Right    Extremity/Trunk Assessment   Upper Extremity Assessment: Overall WFL for tasks assessed           Lower Extremity Assessment: Generalized weakness (bil LE edema noted)      Cervical / Trunk Assessment: Kyphotic  Communication   Communication: No difficulties  Cognition Arousal/Alertness: Awake/alert Behavior During Therapy: WFL for tasks assessed/performed Overall Cognitive Status: Within Functional Limits for tasks assessed                      General Comments      Exercises  General Exercises - Lower Extremity Ankle Circles/Pumps: AROM;Strengthening;10 reps;Both;Seated      Assessment/Plan    PT Assessment Patient needs continued PT services  PT Diagnosis Difficulty walking;Generalized weakness   PT Problem List Decreased strength;Decreased activity tolerance;Decreased mobility  PT Treatment Interventions DME instruction;Gait training;Functional mobility training;Therapeutic activities;Therapeutic exercise;Balance  training;Neuromuscular re-education;Patient/family education   PT Goals (Current goals can be found in the Care Plan section) Acute Rehab PT Goals Patient Stated Goal: to go home PT Goal Formulation: With patient Time For Goal Achievement: 10/03/13 Potential to Achieve Goals: Good    Frequency Min 3X/week   Barriers to discharge        Co-evaluation               End of Session Equipment Utilized During Treatment: Gait belt;Oxygen (O2 as needed at home ) Activity Tolerance: Patient tolerated treatment well Patient left: in chair;with call bell/phone within reach;with chair alarm set;with family/visitor present Nurse Communication: Mobility status    Functional Assessment Tool Used: clinical judgement  Functional Limitation: Mobility: Walking and moving around Mobility: Walking and Moving Around Current Status ([id number]): At least 1 percent but less than 20 percent impaired, limited or restricted Mobility: Walking and Moving Around Goal Status [id number]): At least 1 percent but less than 20 percent impaired, limited or restricted Mobility: Walking and Moving Around Discharge Status [phone]): At least 1 percent but less than 20 percent impaired, limited or restricted    Time: 8938-1017 PT Time Calculation (min): 16 min   Charges:   PT Evaluation $Initial PT Evaluation Tier I: 1 Procedure PT Treatments $Gait Training: 8-22 mins   PT G Codes:   Functional Assessment Tool Used: clinical judgement  Functional Limitation: Mobility: Walking and moving around    [place], [place]  [phone] 09/29/2013, 3:45 PM

## 2013-09-29 NOTE — Progress Notes (Signed)
Pt c/o SOB, went to pt's room to assess the pt. Pt is on o2 [place] at  2LPM, checked o2 sat is  94%, HOB elevated to 45 degrees and pt is asked for deep breathing. Pt verbalized her breathing is getting better after positioning. Will continue to monitor.

## 2013-09-29 NOTE — Progress Notes (Signed)
Subjective: Patient feeling as though her R leg weakness has improved. The intermittent jerking has resolved completely. Patient feels overall much better. No complaints this morning. Passed swallow study yesterday so has been tolerating carb mod diet well.   Objective: Vital signs in last 24 hours: Filed Vitals:   09/29/13 0630 09/29/13 0800 09/29/13 0925 09/29/13 1008  BP: 175/75 183/79  179/77  Pulse: 92     Temp:      TempSrc:      Resp:      Height:      Weight:      SpO2:   96%    Weight change:   Intake/Output Summary (Last 24 hours) at 09/29/13 1018 Last data filed at 09/29/13 0900  Gross per 24 hour  Intake    240 ml  Output      0 ml  Net    240 ml   Physical Exam General: alert, cooperative, and in no apparent distress; appears more calm today--no jerking HEENT: NCAT, vision grossly intact, no visible facial droop Neck: supple Lungs: CTAB Heart: RRR Abdomen: firm, large ventral hernia; non-tender, non-distended, normal bowel sounds  Extremities: warm extremities b/l, trace pitting edema to BLE with chronic venous stasis changes to skin Neurologic: alert & oriented X3, no more abnormal movements, witnessed patient going from bed to bedside commode without issues; grip strength is normal  Lab Results: Basic Metabolic Panel:  Recent Labs Lab 09/28/13 1304 09/29/13 0527  NA 138 141  K 5.2 4.4  CL 97 100  CO2 26 29  GLUCOSE 198* 156*  BUN 44* 42*  CREATININE 1.17* 1.02  CALCIUM 9.3 9.9   Liver Function Tests:  Recent Labs Lab 09/28/13 1304 09/29/13 0527  AST 46* 58*  ALT 75* 83*  ALKPHOS 205* 220*  BILITOT 0.3 0.2*  PROT 6.9 6.8  ALBUMIN 3.1* 3.2*   CBC:  Recent Labs Lab 09/28/13 1304 09/29/13 0527  WBC 13.4* 9.7  NEUTROABS 11.6*  --   HGB 13.6 12.7  HCT 42.8 40.3  MCV 84.3 83.4  PLT 159 131*   Cardiac Enzymes:  Recent Labs Lab 09/28/13 1304  TROPONINI <0.30   BNP:  Recent Labs Lab 09/28/13 1304  PROBNP 356.8*    CBG:  Recent Labs Lab 09/28/13 1830 09/28/13 2206 09/29/13 0820  GLUCAP 106* 121* 162*   Hemoglobin A1C:  Recent Labs Lab 09/28/13 1304  HGBA1C 6.6*   Thyroid Function Tests:  Recent Labs Lab 09/29/13 0527  TSH 1.100   Urinalysis:  Recent Labs Lab 09/28/13 1220  COLORURINE YELLOW  LABSPEC 1.015  PHURINE 5.5  GLUCOSEU NEGATIVE  HGBUR NEGATIVE  BILIRUBINUR NEGATIVE  KETONESUR NEGATIVE  PROTEINUR NEGATIVE  UROBILINOGEN 0.2  NITRITE NEGATIVE  LEUKOCYTESUR TRACE*   Micro Results: No results found for this or any previous visit (from the past 240 hour(s)). Studies/Results: Dg Chest 2 View  09/28/2013   CLINICAL DATA:  Fall with weakness.  EXAM: CHEST  2 VIEW  COMPARISON:  None.  FINDINGS: Patient is rotated. Trachea is grossly midline. Heart is enlarged. Marked right hemidiaphragm elevation obscures the lower right chest. Left lung is grossly clear. Osseous structures appear grossly intact.  IMPRESSION: 1. No definite acute findings. 2. Markedly elevated right hemidiaphragm.   Electronically Signed   By: [name] M.D.   On: 09/28/2013 13:54   Ct Head Wo Contrast  09/28/2013   CLINICAL DATA:  Fall.  Weakness.  Tremors.  EXAM: CT HEAD WITHOUT CONTRAST  TECHNIQUE: Contiguous  axial images were obtained from the base of the skull through the vertex without intravenous contrast.  COMPARISON:  None.  FINDINGS: There is no evidence of intracranial hemorrhage, brain edema, or other signs of acute infarction. There is no evidence of intracranial mass lesion or mass effect. No abnormal extraaxial fluid collections are identified.  Mild cerebral atrophy noted. No evidence of hydrocephalus. No evidence of skull fracture or other bone lesions.  IMPRESSION: No acute intracranial abnormality.  Mild cerebral atrophy.   Electronically Signed   By: [name] M.D.   On: 09/28/2013 14:20   Ct Cervical Spine Wo Contrast  09/28/2013   CLINICAL DATA:  Fall.  Upper extremity weakness.   EXAM: CT CERVICAL SPINE WITHOUT CONTRAST  TECHNIQUE: Multidetector CT imaging of the cervical spine was performed without intravenous contrast. Multiplanar CT image reconstructions were also generated.  COMPARISON:  None.  FINDINGS: There is curvature convex to the right which could be fixed or positional. Alignment is normal in the sagittal projection. There is no evidence of fracture. There is mild spondylosis at C5-6 without gross neural compression.  There is some atherosclerotic calcification at the carotid bifurcations. The right lobe of the thyroid is enlarged compared to the left. Right-sided thyroid mass is possible. Ultrasound would be suggested as an outpatient.  IMPRESSION: No acute or traumatic finding with respect to the spine. Mild spondylosis C5-6.  Enlargement of the right lobe of the thyroid. Thyroid ultrasound recommended non emergently as an outpatient.   Electronically Signed   By: Nelson Chimes M.D.   On: 09/28/2013 20:00   Medications: I have reviewed the patient's current medications. Scheduled Meds: . aspirin EC  81 mg Oral Daily  . atorvastatin  40 mg Oral q1800  . clopidogrel  75 mg Oral Daily  . furosemide  160 mg Oral q morning - 10a  . heparin  5,000 Units Subcutaneous 3 times per day  . insulin aspart  0-9 Units Subcutaneous TID WC  . insulin glargine  15 Units Subcutaneous QHS  . letrozole  2.5 mg Oral Daily  . levothyroxine  100 mcg Oral QAC breakfast  . lisinopril  10 mg Oral BID  . metoCLOPramide  10 mg Oral TID  . morphine  30 mg Oral Q12H  . pregabalin  75 mg Oral Q6H  . rOPINIRole  2 mg Oral QHS  . sertraline  50 mg Oral Daily  . tiotropium  18 mcg Inhalation Daily  . traMADol  100 mg Oral Q6H   Continuous Infusions:  PRN Meds:.ALPRAZolam  Assessment/Plan: # RLE weakness in the setting of chronic BLE weakness and acute GLF: Patient notes improvement of her RLE weakness. Complete resolution of her extremity jerking. I suspect her fall yesterday was 2/2  being drowsy since it occurred right when she woke up at 2AM and she had taken a xanax prior to going to bed. All symptoms appear to be at baseline and are most likely related to deconditioning. She is not orthostatic on exam, TSH wnl. MRI head had been ordered to evaluate for CVA, but given she has no other stroke like symptoms and her symptoms have completely resolved, there is no need for MRI so this will be canceled. Patient already has f/u appointment to establish with a new PCP (Dr. Anitra Lauth) tomorrow, which she will attend. Will plan to d/c pt home once PT has evaluated her. -PT/OT consult today, then discharge pending their recommendations -cancel MRI head -carb mod diet -continue home ASA, plavix, statin   #  Chronic bilateral hand weakness, possible cervical radiculopathy: Grip strength is normal today. Neck CT scan unrevealing (showed mild spondylosis C5-6, no neural impingement). No further inpatient work up needed.  # DM type 2: A1c 6.6% this admission.  Patient is on lantus 43U BID and aspart 10U TID WC at home. Also on lyrica 75mg  TID for likely peripheral neuropathy and reglan for likely gastroparesis (though patient has not been here in the past so her PMH is sparse).  -continue lyrica and reglan  -SSI, sensitive  -lantus 15U qHS  -PCP f/u tomorrow to establish care -d/c home on home regimen  # HTN: Slightly hypertensive this AM. Patient is on lisinopril 10mg  BID as well as lasix 160mg  daily, which we will continue. Will need to establish care with PCP as previously mentioned.   # Fibromyalgia/Chronic pain: Patient's pain overall appears to be stable. Will continue home medication regimen.  -continue home MS contin 30mg  BID, ultram 100mg  q6h   # Mental health: Patient with hx of anxiety/depression. On xanax, zoloft at home. Will continue her home medications.   #COPD: Patient's breathing is stable and at baseline. Apparently she uses oxygen at night (3.5LPM) and as needed at  home. No concern for acute exacerbation at this time.  -continue home spiriva  -supplemental O2 prn  # H/o breast cancer on Right: Continue home letrozole.   # Hypothyroidism: TSH this admission 1.1. Continue home synthroid 174mcg daily. Neck CT showed incidental enlargement of R lobe of her thyroid, which will need outpatient f/u with [place].   # BLE edema: Patient with chronic BLE edema with chronic venous stasis change. On lasix 160mg  daily.  -continue home lasix   # VTE: heparin  # Diet: carb mod diet Code status: full code  Dispo: Discharge today after patient sees PT.  The patient does have a current PCP Unk Pinto, MD) and does not need an [facility] hospital follow-up appointment after discharge.  The patient does not have transportation limitations that hinder transportation to clinic appointments.  .Services Needed at time of discharge: Y = Yes, Blank = No PT:   OT:   RN:   Equipment:   Other:     LOS: 1 day   Rebecca Eaton, MD 09/29/2013, 10:18 AM

## 2013-09-29 NOTE — Care Management Note (Addendum)
    Page 1 of 1   09/29/2013     2:10:32 PM CARE MANAGEMENT NOTE 09/29/2013  Patient:  Brooke, Mccullough   Account Number:  [id number]  Date Initiated:  09/29/2013  Documentation initiated by:  Tomi Bamberger  Subjective/Objective Assessment:   dx weakness  admit- lives with spouse in indep living at [facility].     Action/Plan:   pt eval-rec hhpt   Anticipated DC Date:  09/29/2013   Anticipated DC Plan:  [place]  CM consult      [facility] Choice  HOME HEALTH   Choice offered to / List presented to:  [name]        Welcome arranged  [place] [facility]      [place].   Status of service:  Completed, signed off Medicare Important Message given?  NA - LOS <3 / Initial given by admissions (If response is "NO", the following Medicare IM given date fields will be blank) Date Medicare IM given:   Date Additional Medicare IM given:    Discharge Disposition:  [place]  Per UR Regulation:  Reviewed for med. necessity/level of care/duration of stay  If discussed at [place] of Stay Meetings, dates discussed:    Comments:  09/29/13 12:33 Tomi Bamberger RN, BSN [phone] patient lives with spouse in [facility], per physical therapy rec hhpt, patient chose [facility] , referral made to [facility], Butch Penny notified.  Soc will begin 24-48 hrs post dc.  Patient states she has medicare and hap insurance.  Patient's spouse will be her transport home at dc.

## 2013-09-30 ENCOUNTER — Encounter: Payer: Self-pay | Admitting: Family Medicine

## 2013-09-30 ENCOUNTER — Ambulatory Visit (INDEPENDENT_AMBULATORY_CARE_PROVIDER_SITE_OTHER): Payer: Medicare (Managed Care) | Admitting: Family Medicine

## 2013-09-30 VITALS — BP 135/80 | HR 80 | Temp 98.0°F | Resp 16 | Ht 64.5 in | Wt 208.0 lb

## 2013-09-30 DIAGNOSIS — W19XXXA Unspecified fall, initial encounter: Secondary | ICD-10-CM

## 2013-09-30 DIAGNOSIS — E049 Nontoxic goiter, unspecified: Secondary | ICD-10-CM

## 2013-09-30 DIAGNOSIS — G894 Chronic pain syndrome: Secondary | ICD-10-CM

## 2013-09-30 DIAGNOSIS — R7401 Elevation of levels of liver transaminase levels: Secondary | ICD-10-CM

## 2013-09-30 DIAGNOSIS — IMO0002 Reserved for concepts with insufficient information to code with codable children: Secondary | ICD-10-CM

## 2013-09-30 DIAGNOSIS — E042 Nontoxic multinodular goiter: Secondary | ICD-10-CM | POA: Insufficient documentation

## 2013-09-30 DIAGNOSIS — M1710 Unilateral primary osteoarthritis, unspecified knee: Secondary | ICD-10-CM

## 2013-09-30 DIAGNOSIS — R740 Nonspecific elevation of levels of transaminase and lactic acid dehydrogenase [LDH]: Secondary | ICD-10-CM

## 2013-09-30 DIAGNOSIS — M1711 Unilateral primary osteoarthritis, right knee: Secondary | ICD-10-CM

## 2013-09-30 DIAGNOSIS — E119 Type 2 diabetes mellitus without complications: Secondary | ICD-10-CM

## 2013-09-30 DIAGNOSIS — E010 Iodine-deficiency related diffuse (endemic) goiter: Secondary | ICD-10-CM

## 2013-09-30 DIAGNOSIS — Y92009 Unspecified place in unspecified non-institutional (private) residence as the place of occurrence of the external cause: Secondary | ICD-10-CM

## 2013-09-30 LAB — URINE CULTURE: Colony Count: 25000

## 2013-09-30 MED ORDER — METHYLPREDNISOLONE ACETATE 40 MG/ML IJ SUSP
40.0000 mg | Freq: Once | INTRAMUSCULAR | Status: AC
  Administered 2013-09-30: 40 mg via INTRA_ARTICULAR

## 2013-09-30 NOTE — Assessment & Plan Note (Signed)
Good control as per recent HbA1c. Continue current insulin regimen.

## 2013-09-30 NOTE — Assessment & Plan Note (Signed)
Will arrange thyroid u/s at next f/u visit in 2 wks.

## 2013-09-30 NOTE — Assessment & Plan Note (Signed)
Primarily fibromyalgia per pt, but also some osteoarthritis. Controlled substance contract reviewed with patient today.  Patient signed this and it will be placed in the chart.  UDS today. No narcotic meds rx'd today. She'll be due for rx's at next f/u visit in 2 wks. I told her I may choose to have a pain management MD manage her pain in the future and she expressed understanding.

## 2013-09-30 NOTE — Assessment & Plan Note (Signed)
With elevated alk phos level. Biliary obstruction suspected, but pt's lack of GI symptoms makes this unlikely/puzzling. She does have hx of gallstones and has not had gallbladder removed b/c of being asymptomatic. Plan since she has no symptoms/is not ill: watchful waiting, recheck hepatic panel at f/u in 2 wks.  If still elevated, will proceed with HIDA scan. Obtain old records. 

## 2013-09-30 NOTE — Progress Notes (Signed)
Office Note 09/30/2013  CC:  Chief Complaint  Patient presents with  . Establish Care    moved from [place] 3 weeks ago    HPI:  Brooke Mccullough is a 68 y.o. White female who is here with her husband to establish care. Patient's most recent primary MD: Dr. Tobe Sos in [place], [place]. Old records in [place] were reviewed prior to or during today's visit--recent hospitalization 6/8-09/29/13.  Recent problem: right knee pain, some swelling, worse with wt bearing and with flexion/extension.  No warmth of knee, no redness.  Has been hurting for a few months now, worse the last 1 wk or so, occ buckling/locking.   Says similar pain began around 04/2012 and she eventually got an ortho eval and MRI showed torn medial meniscus and "bone on bone" arthritis.  Conservative approach was chosen and she got a steroid injection and says it helped wonderfully.  The knee did not become a problem again until a few months ago.   Then, 2 d/a she fell due to the knee pain, was taken to the hospital ([facility]), got lots of testing b/c they were worried she may have had a stroke, but she did not.  She is perplexed b/c she kept telling them she fell b/c of her knee pain but this was not evaluated/attended to. Has some similar, mild pain in left knee with mild swelling in it as well, feels like this has come on only in the last few weeks due to compensatory changes in standing and ambulation as a result of her right knee pains.  Also picked up in hospital incidentally was right lobe thyroid enlargement when she got a CT of her neck to eval for cervical radiculopathy.  Pt says she has long hx of hypothyroidism but she does not recall ever getting a thyroid u/s or ever being told she had thyroid enlargement before.  Her TSH in hosp was 1.1 and she was continued on her 100 mcg qd synthroid. She did have mildly elevated AST and ALT, also with elevated Alk phos.  Bili normal.  Again, this is a problem that the  patient denies having in the past to her knowledge.  She doesn't drink alcohol or take tylenol.  She does take several doses of ibuprofen daily.  Has hx of gallstones but denies any post prandial abd pain, bloating, nausea, or other GI complaints.  She has chronic pain syndrome---fibromyalgia--apparently has been on narcotics longterm+ lyrica, says she got the narcotics filled 09/21/13 so does not need any rx's today (MS contin and tramadol as listed below--says tramadol may have been given yesterday when she was still in hosp but she didn't take it today.  Last MS contin was today.  Last xanax was last night.  Lyrica was today.)  Past Medical History  Diagnosis Date  . COPD (chronic obstructive pulmonary disease)   . Fibromyalgia     Dx'd 1993.  This has impaired her significantly.  . IBS (irritable bowel syndrome)   . Edema   . Gallstones     asymptomatic per pt (as of 09/30/2013)  . Hernia of abdominal wall   . Hypertension   . High cholesterol   . On home oxygen therapy     "3.5L @ night and during day prn" (09/28/2013)  . History of pneumonia     "once"  . Adrenal benign tumor   . Type II diabetes mellitus     hx of good control per pt report + HbA1c <7%  June 2015.  Marland Kitchen Anemia   . GERD (gastroesophageal reflux disease)   . History of duodenal ulcer   . Daily headache   . Migraine     "@ least monthly" (09/28/2013)  . TIA (transient ischemic attack) 2012; 2014  . Degenerative disc disease     Cervical and lumbar  . Osteoarthritis (arthritis due to wear and tear of joints)     "back; knees; elbows; left ankle" (09/28/2013)  . Chronic lower back pain     "L5-S1" (09/28/2013)  . Gout   . Anxiety   . Depression   . Breast cancer 2008    "right".  Lumpectomy + radiation+aromatase inhibitor (femara).  Regular f/u and mammos normal.  . Tremor     "upper extremities; last 6-7 months" (09/28/2013)    Past Surgical History  Procedure Laterality Date  . Lumbar laminectomy  1991; 1996  .  Ankle fracture surgery Left 2013  . Total abdominal hysterectomy w/ bilateral salpingoophorectomy  1990  . Breast biopsy Right 2008  . Breast lumpectomy Right 2008  . Cataract extraction w/ intraocular lens implant Right 08/2013  . Cardiac catheterization  2000's X 2  . Bladder tack      Family History  Problem Relation Age of Onset  . Arthritis Mother   . Breast cancer Mother   . Hyperlipidemia Mother   . Stroke Mother   . Hypertension Mother   . Mental illness Mother   . Arthritis Father   . Cancer Father   . Hyperlipidemia Father   . Heart disease Father   . Heart disease Maternal Grandmother   . Hyperlipidemia Maternal Grandmother   . Heart disease Paternal Grandmother   . Hypertension Paternal Grandmother   . Diabetes Paternal Grandmother   . Arthritis Paternal Grandfather     History   Social History  . Marital Status: Married    Spouse Name: N/A    Number of Children: N/A  . Years of Education: N/A   Occupational History  . Not on file.   Social History Main Topics  . Smoking status: Former Smoker -- 1.00 packs/day for 36 years    Types: Cigarettes    Quit date: 09/09/2013  . Smokeless tobacco: Never Used  . Alcohol Use: No  . Drug Use: No  . Sexual Activity: Not Currently   Other Topics Concern  . Not on file   Social History Narrative   Married, 2 children (one son and one daughter).   Relocated from [place] 08/2013 to be closer to children.   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   Educ: HS and 2 yrs business college.   Tobacco: 36 pack-yr hx, quit 2015.   Alcohol: none.  No drugs.   Ambulates with walker.         MEDS: not taking simvastatin listed below Outpatient Encounter Prescriptions as of 09/30/2013  Medication Sig  . ALPRAZolam (XANAX) 0.5 MG tablet Take 0.5 mg by mouth 3 (three) times daily as needed for anxiety or sleep.  Marland Kitchen aspirin EC 81 MG tablet Take 81 mg by mouth daily.  Marland Kitchen atorvastatin (LIPITOR) 40 MG  tablet Take 40 mg by mouth daily.  . calcium gluconate 500 MG tablet Take 2 tablets by mouth daily.  . Cholecalciferol (VITAMIN D-3 PO) Take 1 tablet by mouth daily.  . clopidogrel (PLAVIX) 75 MG tablet Take 75 mg by mouth daily.  . ferrous sulfate 325 (65 FE) MG tablet Take 325 mg by mouth daily  with breakfast.  . furosemide (LASIX) 40 MG tablet Take 80 mg by mouth daily.  Marland Kitchen ibuprofen (ADVIL,MOTRIN) 200 MG tablet Take 400 mg by mouth every 4 (four) hours as needed (for pain).  . insulin aspart (NOVOLOG) 100 UNIT/ML injection Inject 10 Units into the skin 3 (three) times daily before meals.  . insulin glargine (LANTUS) 100 UNIT/ML injection Inject 43 Units into the skin 2 (two) times daily.  Marland Kitchen letrozole (FEMARA) 2.5 MG tablet Take 2.5 mg by mouth daily.  Marland Kitchen levothyroxine (SYNTHROID, LEVOTHROID) 100 MCG tablet Take 100 mcg by mouth daily before breakfast.  . lisinopril (PRINIVIL,ZESTRIL) 10 MG tablet Take 10 mg by mouth 2 (two) times daily.  . meclizine (ANTIVERT) 25 MG tablet Take 25 mg by mouth 3 (three) times daily as needed for dizziness.  . metoCLOPramide (REGLAN) 10 MG tablet Take 10 mg by mouth 3 (three) times daily.  Marland Kitchen morphine (MS CONTIN) 30 MG 12 hr tablet Take 30 mg by mouth every 12 (twelve) hours.  . Multiple Vitamin (MULTIVITAMIN WITH MINERALS) TABS tablet Take 1 tablet by mouth daily.  Marland Kitchen omeprazole (PRILOSEC) 40 MG capsule Take 40 mg by mouth daily.  . potassium chloride SA (K-DUR,KLOR-CON) 20 MEQ tablet Take 20 mEq by mouth daily.  . pregabalin (LYRICA) 75 MG capsule Take 150 mg by mouth every 6 (six) hours.   Marland Kitchen rOPINIRole (REQUIP) 1 MG tablet Take 2 mg by mouth at bedtime.  . sertraline (ZOLOFT) 50 MG tablet Take 50 mg by mouth daily.  Marland Kitchen tiotropium (SPIRIVA) 18 MCG inhalation capsule Place 18 mcg into inhaler and inhale daily.  . traMADol (ULTRAM) 50 MG tablet Take 100 mg by mouth every 6 (six) hours.  . [DISCONTINUED] furosemide (LASIX) 80 MG tablet Take 160 mg by mouth every  morning.  . [DISCONTINUED] simvastatin (ZOCOR) 40 MG tablet Take 40 mg by mouth daily.  . Magnesium Oxide 500 MG (LAX) TABS Take 500 mg by mouth 2 (two) times daily.  . [EXPIRED] methylPREDNISolone acetate (DEPO-MEDROL) injection 40 mg     Allergies  Allergen Reactions  . Clindamycin/Lincomycin Rash  . Fentanyl Rash    Duragesic Patch  . Indocin [Indomethacin] Rash  . Keflex [Cephalexin] Rash  . Penicillins Rash  . Vancomycin Rash    ROS Review of Systems  Constitutional: Negative for fever and fatigue.  HENT: Negative for congestion and sore throat.   Eyes: Negative for visual disturbance.  Respiratory: Negative for cough.   Cardiovascular: Negative for chest pain.  Gastrointestinal: Negative for nausea and abdominal pain.  Genitourinary: Negative for dysuria.  Musculoskeletal: Negative for back pain. Myalgias: chronic.  Skin: Negative for rash.  Neurological: Negative for weakness and headaches.  Hematological: Negative for adenopathy.  Psychiatric/Behavioral: Negative for dysphoric mood. The patient is not nervous/anxious.     PE; Blood pressure 135/80, pulse 80, temperature 98 F (36.7 C), temperature source Temporal, resp. rate 16, height 5' 4.5" (1.638 m), weight 208 lb (94.348 kg), SpO2 95.00%. Gen: Alert, well appearing, sitting up in wheelchair.  Patient is oriented to person, place, time, and situation. QMG:QQPY: no injection, icteris, swelling, or exudate.  EOMI, PERRLA. Mouth: lips without lesion/swelling.  Oral mucosa pink and moist. Oropharynx without erythema, exudate, or swelling.  CV: RRR, no m/r/g.   LUNGS: CTA bilat, nonlabored resps, good aeration in all lung fields. EXT: 1+ pitting edema in pretibial regions bilat, also with more of a doughy-type lymphedema with skin hyperkeratosis diffusely in LE's.   No erythema or ulcerations. Knees:  mild increase in adiposity of knees, bony contour mildly indistinct.  Left knee with actually a bit more of this  appearance than right.  Right knee with TTP along medial>lateral joint line, with mild peripatellar tenderness as well.  Flexion and extension are intact but painful.  No crepitus is palpable or audible. No erythema or warmth of knees.  Pertinent labs:  None today  Recent labs in hosp: Lab Results  Component Value Date   WBC 9.7 09/29/2013   HGB 12.7 09/29/2013   HCT 40.3 09/29/2013   MCV 83.4 09/29/2013   PLT 131* 09/29/2013     Chemistry      Component Value Date/Time   NA 141 09/29/2013 0527   K 4.4 09/29/2013 0527   CL 100 09/29/2013 0527   CO2 29 09/29/2013 0527   BUN 42* 09/29/2013 0527   CREATININE 1.02 09/29/2013 0527      Component Value Date/Time   CALCIUM 9.9 09/29/2013 0527   ALKPHOS 220* 09/29/2013 0527   AST 58* 09/29/2013 0527   ALT 83* 09/29/2013 0527   BILITOT 0.2* 09/29/2013 0527     Lab Results  Component Value Date   TSH 1.100 09/29/2013   Lab Results  Component Value Date   HGBA1C 6.8* 09/29/2013   IMAGING:  CT cervical spine 09/28/13: mild spondylosis C5-6 w/out gross neural compression.  Enlargement of right lobe of thyroid.  CT head 09/28/13: no acute intracranial abnormality.  Mild cerebral atrophy.  CXR 09/28/13: cardiomegaly with markedly elevated right hemidiaphragm.  ASSESSMENT AND PLAN:   New pt: obtain old records.  Fall at home Recent hosp admission for this, eval reassuring: likely generalized weakness due to chronic deconditioning, also benzo use hs, also right knee pain. She is set up for [facility] PT per hospital d/c summary. I did steroid knee injection today.  Osteoarthritis of right knee  Procedure: Therapeutic knee injection.  The patient's clinical condition is marked by substantial pain and/or significant functional disability.  Other conservative therapy has not provided relief, is contraindicated, or not appropriate.  There is a reasonable likelihood that injection will significantly improve the patient's pain and/or functional disability. Cleaned skin with  alcohol swab, used anterior approach with pt sitting up in her wheelchair, Injected 1 ml of 38m/ml depo medrol + 2 ml of 2% lidocaine into joint space without resistance.  No immediate complications.  Patient tolerated procedure well.  Post-injection care discussed, including 20 min of icing 1-2 times in the next 4-8 hours, frequent non weight-bearing ROM exercises over the next few days, and general pain medication management.    Elevated transaminase level With elevated alk phos level. Biliary obstruction suspected, but pt's lack of GI symptoms makes this unlikely/puzzling. She does have hx of gallstones and has not had gallbladder removed b/c of being asymptomatic. Plan since she has no symptoms/is not ill: watchful waiting, recheck hepatic panel at f/u in 2 wks.  If still elevated, will proceed with HIDA scan. Obtain old records.  Chronic pain syndrome Primarily fibromyalgia per pt, but also some osteoarthritis. Controlled substance contract reviewed with patient today.  Patient signed this and it will be placed in the chart.  UDS today. No narcotic meds rx'd today. She'll be due for rx's at next f/u visit in 2 wks. I told her I may choose to have a pain management MD manage her pain in the future and she expressed understanding.  Diabetes Good control as per recent HbA1c. Continue current insulin regimen.  Thyromegaly Will arrange  thyroid u/s at next f/u visit in 2 wks.   An After Visit Summary was printed and given to the patient.  Return in about 2 weeks (around 10/14/2013). Pt to get [place] info on past oncology MD/office so we can obtain old records and make preparations to get her set up with local oncologist to follow her for hx of breast cancer.

## 2013-09-30 NOTE — Assessment & Plan Note (Signed)
Recent hosp admission for this, eval reassuring: likely generalized weakness due to chronic deconditioning, also benzo use hs, also right knee pain. She is set up for [facility] PT per hospital d/c summary. I did steroid knee injection today.

## 2013-09-30 NOTE — Progress Notes (Signed)
Pre visit review using our clinic review tool, if applicable. No additional management support is needed unless otherwise documented below in the visit note. 

## 2013-09-30 NOTE — Patient Instructions (Signed)
Bring name of office or MD name for cancer doctor.

## 2013-09-30 NOTE — Discharge Summary (Signed)
INTERNAL MEDICINE ATTENDING DISCHARGE COSIGN   I evaluated the patient on the day of discharge and discussed the discharge plan with my resident team. I agree with the discharge documentation and disposition.   Coltan Spinello 09/30/2013, 10:52 PM

## 2013-09-30 NOTE — Assessment & Plan Note (Signed)
  Procedure: Therapeutic knee injection.  The patient's clinical condition is marked by substantial pain and/or significant functional disability.  Other conservative therapy has not provided relief, is contraindicated, or not appropriate.  There is a reasonable likelihood that injection will significantly improve the patient's pain and/or functional disability. Cleaned skin with alcohol swab, used anterior approach with pt sitting up in her wheelchair, Injected 1 ml of 40mg /ml depo medrol + 2 ml of 2% lidocaine into joint space without resistance.  No immediate complications.  Patient tolerated procedure well.  Post-injection care discussed, including 20 min of icing 1-2 times in the next 4-8 hours, frequent non weight-bearing ROM exercises over the next few days, and general pain medication management.

## 2013-10-06 NOTE — Progress Notes (Signed)
Late entry due to missed g-code. 10-26-13 Nestor Lewandowsky, OTR/L Pager: [phone]  10/26/2013 0800  OT G-codes **NOT FOR INPATIENT CLASS**  Functional Assessment Tool Used (clincal judgement)  Functional Limitation Self care  Self Care Current Status [phone]) CI  Self Care Goal Status ([id number]) CI  Self Care Discharge Status [phone]) CI  2013-10-26 Nestor Lewandowsky, OTR/L Pager: [phone]

## 2013-10-07 ENCOUNTER — Emergency Department (HOSPITAL_COMMUNITY): Payer: Medicare (Managed Care)

## 2013-10-07 ENCOUNTER — Encounter (HOSPITAL_COMMUNITY): Payer: Self-pay | Admitting: Emergency Medicine

## 2013-10-07 ENCOUNTER — Inpatient Hospital Stay (HOSPITAL_COMMUNITY)
Admission: EM | Admit: 2013-10-07 | Discharge: 2013-10-10 | DRG: 291 | Disposition: A | Discharge status: Home Health | Payer: Medicare (Managed Care) | Attending: Internal Medicine | Admitting: Internal Medicine

## 2013-10-07 DIAGNOSIS — E089 Diabetes mellitus due to underlying condition without complications: Secondary | ICD-10-CM

## 2013-10-07 DIAGNOSIS — J4489 Other specified chronic obstructive pulmonary disease: Secondary | ICD-10-CM | POA: Diagnosis present

## 2013-10-07 DIAGNOSIS — Z7982 Long term (current) use of aspirin: Secondary | ICD-10-CM | POA: Diagnosis not present

## 2013-10-07 DIAGNOSIS — G8929 Other chronic pain: Secondary | ICD-10-CM | POA: Diagnosis present

## 2013-10-07 DIAGNOSIS — G894 Chronic pain syndrome: Secondary | ICD-10-CM

## 2013-10-07 DIAGNOSIS — Z8673 Personal history of transient ischemic attack (TIA), and cerebral infarction without residual deficits: Secondary | ICD-10-CM

## 2013-10-07 DIAGNOSIS — Z7902 Long term (current) use of antithrombotics/antiplatelets: Secondary | ICD-10-CM | POA: Diagnosis not present

## 2013-10-07 DIAGNOSIS — F329 Major depressive disorder, single episode, unspecified: Secondary | ICD-10-CM | POA: Diagnosis present

## 2013-10-07 DIAGNOSIS — IMO0001 Reserved for inherently not codable concepts without codable children: Secondary | ICD-10-CM | POA: Diagnosis present

## 2013-10-07 DIAGNOSIS — J449 Chronic obstructive pulmonary disease, unspecified: Secondary | ICD-10-CM | POA: Diagnosis present

## 2013-10-07 DIAGNOSIS — M549 Dorsalgia, unspecified: Secondary | ICD-10-CM | POA: Diagnosis present

## 2013-10-07 DIAGNOSIS — E872 Acidosis, unspecified: Secondary | ICD-10-CM | POA: Diagnosis present

## 2013-10-07 DIAGNOSIS — Z794 Long term (current) use of insulin: Secondary | ICD-10-CM | POA: Diagnosis not present

## 2013-10-07 DIAGNOSIS — Z961 Presence of intraocular lens: Secondary | ICD-10-CM | POA: Diagnosis not present

## 2013-10-07 DIAGNOSIS — R410 Disorientation, unspecified: Secondary | ICD-10-CM | POA: Diagnosis present

## 2013-10-07 DIAGNOSIS — N179 Acute kidney failure, unspecified: Secondary | ICD-10-CM | POA: Diagnosis present

## 2013-10-07 DIAGNOSIS — I5031 Acute diastolic (congestive) heart failure: Principal | ICD-10-CM | POA: Diagnosis present

## 2013-10-07 DIAGNOSIS — Z9981 Dependence on supplemental oxygen: Secondary | ICD-10-CM

## 2013-10-07 DIAGNOSIS — M109 Gout, unspecified: Secondary | ICD-10-CM | POA: Diagnosis present

## 2013-10-07 DIAGNOSIS — F29 Unspecified psychosis not due to a substance or known physiological condition: Secondary | ICD-10-CM

## 2013-10-07 DIAGNOSIS — K219 Gastro-esophageal reflux disease without esophagitis: Secondary | ICD-10-CM | POA: Diagnosis present

## 2013-10-07 DIAGNOSIS — Z853 Personal history of malignant neoplasm of breast: Secondary | ICD-10-CM | POA: Diagnosis not present

## 2013-10-07 DIAGNOSIS — E119 Type 2 diabetes mellitus without complications: Secondary | ICD-10-CM | POA: Diagnosis present

## 2013-10-07 DIAGNOSIS — F411 Generalized anxiety disorder: Secondary | ICD-10-CM | POA: Diagnosis present

## 2013-10-07 DIAGNOSIS — K589 Irritable bowel syndrome without diarrhea: Secondary | ICD-10-CM | POA: Diagnosis present

## 2013-10-07 DIAGNOSIS — E010 Iodine-deficiency related diffuse (endemic) goiter: Secondary | ICD-10-CM

## 2013-10-07 DIAGNOSIS — M797 Fibromyalgia: Secondary | ICD-10-CM

## 2013-10-07 DIAGNOSIS — R740 Nonspecific elevation of levels of transaminase and lactic acid dehydrogenase [LDH]: Secondary | ICD-10-CM

## 2013-10-07 DIAGNOSIS — N189 Chronic kidney disease, unspecified: Secondary | ICD-10-CM | POA: Diagnosis present

## 2013-10-07 DIAGNOSIS — I129 Hypertensive chronic kidney disease with stage 1 through stage 4 chronic kidney disease, or unspecified chronic kidney disease: Secondary | ICD-10-CM | POA: Diagnosis present

## 2013-10-07 DIAGNOSIS — I509 Heart failure, unspecified: Secondary | ICD-10-CM | POA: Diagnosis present

## 2013-10-07 DIAGNOSIS — Z79899 Other long term (current) drug therapy: Secondary | ICD-10-CM

## 2013-10-07 DIAGNOSIS — G9341 Metabolic encephalopathy: Secondary | ICD-10-CM | POA: Diagnosis present

## 2013-10-07 DIAGNOSIS — F3289 Other specified depressive episodes: Secondary | ICD-10-CM | POA: Diagnosis present

## 2013-10-07 DIAGNOSIS — E8729 Other acidosis: Secondary | ICD-10-CM

## 2013-10-07 DIAGNOSIS — R7401 Elevation of levels of liver transaminase levels: Secondary | ICD-10-CM

## 2013-10-07 DIAGNOSIS — G4733 Obstructive sleep apnea (adult) (pediatric): Secondary | ICD-10-CM | POA: Diagnosis present

## 2013-10-07 DIAGNOSIS — M1990 Unspecified osteoarthritis, unspecified site: Secondary | ICD-10-CM | POA: Diagnosis present

## 2013-10-07 DIAGNOSIS — Z87891 Personal history of nicotine dependence: Secondary | ICD-10-CM | POA: Diagnosis not present

## 2013-10-07 DIAGNOSIS — R404 Transient alteration of awareness: Secondary | ICD-10-CM | POA: Diagnosis present

## 2013-10-07 DIAGNOSIS — Z9849 Cataract extraction status, unspecified eye: Secondary | ICD-10-CM | POA: Diagnosis not present

## 2013-10-07 DIAGNOSIS — E785 Hyperlipidemia, unspecified: Secondary | ICD-10-CM | POA: Diagnosis present

## 2013-10-07 LAB — I-STAT VENOUS BLOOD GAS, ED
ACID-BASE DEFICIT: 2 mmol/L (ref 0.0–2.0)
Bicarbonate: 26.9 mEq/L — ABNORMAL HIGH (ref 20.0–24.0)
O2 Saturation: 69 %
TCO2: 29 mmol/L (ref 0–100)
pCO2, Ven: 65.2 mmHg — ABNORMAL HIGH (ref 45.0–50.0)
pH, Ven: 7.223 — ABNORMAL LOW (ref 7.250–7.300)
pO2, Ven: 44 mmHg (ref 30.0–45.0)

## 2013-10-07 LAB — RAPID URINE DRUG SCREEN, HOSP PERFORMED
Amphetamines: NOT DETECTED
Barbiturates: NOT DETECTED
Benzodiazepines: POSITIVE — AB
COCAINE: NOT DETECTED
OPIATES: POSITIVE — AB
Tetrahydrocannabinol: NOT DETECTED

## 2013-10-07 LAB — URINALYSIS, ROUTINE W REFLEX MICROSCOPIC
Bilirubin Urine: NEGATIVE
Glucose, UA: NEGATIVE mg/dL
Hgb urine dipstick: NEGATIVE
KETONES UR: NEGATIVE mg/dL
Leukocytes, UA: NEGATIVE
NITRITE: NEGATIVE
Protein, ur: NEGATIVE mg/dL
Specific Gravity, Urine: 1.017 (ref 1.005–1.030)
Urobilinogen, UA: 0.2 mg/dL (ref 0.0–1.0)
pH: 5 (ref 5.0–8.0)

## 2013-10-07 LAB — CBC WITH DIFFERENTIAL/PLATELET
Basophils Absolute: 0 10*3/uL (ref 0.0–0.1)
Basophils Relative: 0 % (ref 0–1)
Eosinophils Absolute: 0 10*3/uL (ref 0.0–0.7)
Eosinophils Relative: 0 % (ref 0–5)
HCT: 37.1 % (ref 36.0–46.0)
Hemoglobin: 11.5 g/dL — ABNORMAL LOW (ref 12.0–15.0)
LYMPHS ABS: 0.9 10*3/uL (ref 0.7–4.0)
LYMPHS PCT: 7 % — AB (ref 12–46)
MCH: 26.4 pg (ref 26.0–34.0)
MCHC: 31 g/dL (ref 30.0–36.0)
MCV: 85.3 fL (ref 78.0–100.0)
Monocytes Absolute: 0.6 10*3/uL (ref 0.1–1.0)
Monocytes Relative: 5 % (ref 3–12)
NEUTROS ABS: 11.4 10*3/uL — AB (ref 1.7–7.7)
NEUTROS PCT: 88 % — AB (ref 43–77)
PLATELETS: 128 10*3/uL — AB (ref 150–400)
RBC: 4.35 MIL/uL (ref 3.87–5.11)
RDW: 14.7 % (ref 11.5–15.5)
WBC: 13 10*3/uL — ABNORMAL HIGH (ref 4.0–10.5)

## 2013-10-07 LAB — BLOOD GAS, ARTERIAL
Acid-base deficit: 2 mmol/L (ref 0.0–2.0)
Bicarbonate: 23.7 mEq/L (ref 20.0–24.0)
DRAWN BY: 34762
Delivery systems: POSITIVE
Expiratory PAP: 6
FIO2: 0.4 %
INSPIRATORY PAP: 18
MODE: POSITIVE
O2 Saturation: 96.8 %
PCO2 ART: 51.1 mmHg — AB (ref 35.0–45.0)
PH ART: 7.288 — AB (ref 7.350–7.450)
PO2 ART: 182 mmHg — AB (ref 80.0–100.0)
Patient temperature: 98.6
TCO2: 25.3 mmol/L (ref 0–100)

## 2013-10-07 LAB — HEPATIC FUNCTION PANEL
ALBUMIN: 2.8 g/dL — AB (ref 3.5–5.2)
ALT: 16 U/L (ref 0–35)
AST: 14 U/L (ref 0–37)
Alkaline Phosphatase: 120 U/L — ABNORMAL HIGH (ref 39–117)
Bilirubin, Direct: <0.2 mg/dL (ref 0.0–0.3)
TOTAL PROTEIN: 6.4 g/dL (ref 6.0–8.3)
Total Bilirubin: 0.2 mg/dL — ABNORMAL LOW (ref 0.3–1.2)

## 2013-10-07 LAB — I-STAT ARTERIAL BLOOD GAS, ED
Acid-base deficit: 4 mmol/L — ABNORMAL HIGH (ref 0.0–2.0)
BICARBONATE: 25.3 meq/L — AB (ref 20.0–24.0)
O2 Saturation: 97 %
PCO2 ART: 63.6 mmHg — AB (ref 35.0–45.0)
Patient temperature: 98.5
TCO2: 27 mmol/L (ref 0–100)
pH, Arterial: 7.207 — ABNORMAL LOW (ref 7.350–7.450)
pO2, Arterial: 109 mmHg — ABNORMAL HIGH (ref 80.0–100.0)

## 2013-10-07 LAB — BASIC METABOLIC PANEL
BUN: 82 mg/dL — ABNORMAL HIGH (ref 6–23)
CHLORIDE: 102 meq/L (ref 96–112)
CO2: 21 meq/L (ref 19–32)
Calcium: 8.6 mg/dL (ref 8.4–10.5)
Creatinine, Ser: 1.65 mg/dL — ABNORMAL HIGH (ref 0.50–1.10)
GFR calc Af Amer: 36 mL/min — ABNORMAL LOW (ref >90)
GFR calc non Af Amer: 31 mL/min — ABNORMAL LOW (ref >90)
Glucose, Bld: 166 mg/dL — ABNORMAL HIGH (ref 70–99)
POTASSIUM: 4.5 meq/L (ref 3.7–5.3)
SODIUM: 141 meq/L (ref 137–147)

## 2013-10-07 LAB — PROTIME-INR
INR: 0.99 (ref 0.00–1.49)
PROTHROMBIN TIME: 12.9 s (ref 11.6–15.2)

## 2013-10-07 LAB — MRSA PCR SCREENING: MRSA by PCR: NEGATIVE

## 2013-10-07 LAB — AMMONIA: Ammonia: 57 umol/L (ref 11–60)

## 2013-10-07 LAB — LACTIC ACID, PLASMA: LACTIC ACID, VENOUS: 1.2 mmol/L (ref 0.5–2.2)

## 2013-10-07 LAB — TROPONIN I

## 2013-10-07 LAB — CBG MONITORING, ED: GLUCOSE-CAPILLARY: 161 mg/dL — AB (ref 70–99)

## 2013-10-07 LAB — GLUCOSE, CAPILLARY: Glucose-Capillary: 143 mg/dL — ABNORMAL HIGH (ref 70–99)

## 2013-10-07 LAB — TSH: TSH: 0.731 u[IU]/mL (ref 0.350–4.500)

## 2013-10-07 LAB — PRO B NATRIURETIC PEPTIDE: PRO B NATRI PEPTIDE: 919.7 pg/mL — AB (ref 0–125)

## 2013-10-07 MED ORDER — PREGABALIN 75 MG PO CAPS: 150.0000 mg | ORAL_CAPSULE | Freq: Every day | ORAL | Status: DC

## 2013-10-07 MED ORDER — LETROZOLE 2.5 MG PO TABS
2.5000 mg | ORAL_TABLET | Freq: Every day | ORAL | Status: DC
  Administered 2013-10-08 – 2013-10-10 (×3): 2.5 mg via ORAL
  Filled 2013-10-07 (×4): qty 1

## 2013-10-07 MED ORDER — ALBUTEROL SULFATE (2.5 MG/3ML) 0.083% IN NEBU: 2.5000 mg | INHALATION_SOLUTION | RESPIRATORY_TRACT | Status: DC | PRN

## 2013-10-07 MED ORDER — SODIUM CHLORIDE 0.9 % IV SOLN
1000.0000 mL | Freq: Once | INTRAVENOUS | Status: AC
  Administered 2013-10-07: 1000 mL via INTRAVENOUS

## 2013-10-07 MED ORDER — SERTRALINE HCL 50 MG PO TABS
50.0000 mg | ORAL_TABLET | Freq: Every day | ORAL | Status: DC
  Administered 2013-10-08 – 2013-10-10 (×3): 50 mg via ORAL
  Filled 2013-10-07 (×4): qty 1

## 2013-10-07 MED ORDER — SODIUM CHLORIDE 0.9 % IV SOLN
1000.0000 mL | INTRAVENOUS | Status: DC
  Administered 2013-10-07: 1000 mL via INTRAVENOUS

## 2013-10-07 MED ORDER — ASPIRIN EC 81 MG PO TBEC
81.0000 mg | DELAYED_RELEASE_TABLET | Freq: Every day | ORAL | Status: DC
  Administered 2013-10-08 – 2013-10-10 (×3): 81 mg via ORAL
  Filled 2013-10-07 (×4): qty 1

## 2013-10-07 MED ORDER — POTASSIUM CHLORIDE CRYS ER 20 MEQ PO TBCR
20.0000 meq | EXTENDED_RELEASE_TABLET | Freq: Every day | ORAL | Status: DC
  Administered 2013-10-08 – 2013-10-10 (×3): 20 meq via ORAL
  Filled 2013-10-07 (×3): qty 1

## 2013-10-07 MED ORDER — LEVOTHYROXINE SODIUM 100 MCG PO TABS
100.0000 ug | ORAL_TABLET | Freq: Every day | ORAL | Status: DC
  Administered 2013-10-08 – 2013-10-10 (×3): 100 ug via ORAL
  Filled 2013-10-07 (×4): qty 1

## 2013-10-07 MED ORDER — INSULIN GLARGINE 100 UNIT/ML ~~LOC~~ SOLN
30.0000 [IU] | Freq: Two times a day (BID) | SUBCUTANEOUS | Status: DC
  Filled 2013-10-07 (×3): qty 0.3

## 2013-10-07 MED ORDER — ATORVASTATIN CALCIUM 40 MG PO TABS
40.0000 mg | ORAL_TABLET | Freq: Every day | ORAL | Status: DC
  Administered 2013-10-08 – 2013-10-09 (×2): 40 mg via ORAL
  Filled 2013-10-07 (×4): qty 1

## 2013-10-07 MED ORDER — FUROSEMIDE 10 MG/ML IJ SOLN
40.0000 mg | Freq: Two times a day (BID) | INTRAMUSCULAR | Status: DC
  Administered 2013-10-08: 40 mg via INTRAVENOUS
  Filled 2013-10-07 (×3): qty 4

## 2013-10-07 MED ORDER — PANTOPRAZOLE SODIUM 40 MG PO TBEC
40.0000 mg | DELAYED_RELEASE_TABLET | Freq: Every day | ORAL | Status: DC
  Administered 2013-10-08 – 2013-10-10 (×3): 40 mg via ORAL
  Filled 2013-10-07 (×3): qty 1

## 2013-10-07 MED ORDER — TRAMADOL HCL 50 MG PO TABS
50.0000 mg | ORAL_TABLET | Freq: Four times a day (QID) | ORAL | Status: DC | PRN
  Administered 2013-10-08 – 2013-10-10 (×6): 50 mg via ORAL
  Filled 2013-10-07 (×6): qty 1

## 2013-10-07 MED ORDER — INSULIN ASPART 100 UNIT/ML ~~LOC~~ SOLN
0.0000 [IU] | Freq: Three times a day (TID) | SUBCUTANEOUS | Status: DC
  Administered 2013-10-08: 8 [IU] via SUBCUTANEOUS
  Administered 2013-10-08: 3 [IU] via SUBCUTANEOUS
  Administered 2013-10-09: 2 [IU] via SUBCUTANEOUS
  Administered 2013-10-09 (×2): 3 [IU] via SUBCUTANEOUS
  Administered 2013-10-10: 2 [IU] via SUBCUTANEOUS

## 2013-10-07 MED ORDER — CLOPIDOGREL BISULFATE 75 MG PO TABS
75.0000 mg | ORAL_TABLET | Freq: Every day | ORAL | Status: DC
  Administered 2013-10-08 – 2013-10-10 (×3): 75 mg via ORAL
  Filled 2013-10-07 (×4): qty 1

## 2013-10-07 MED ORDER — IPRATROPIUM-ALBUTEROL 0.5-2.5 (3) MG/3ML IN SOLN
3.0000 mL | RESPIRATORY_TRACT | Status: DC
  Administered 2013-10-07 – 2013-10-09 (×8): 3 mL via RESPIRATORY_TRACT
  Filled 2013-10-07 (×8): qty 3

## 2013-10-07 MED ORDER — ENOXAPARIN SODIUM 40 MG/0.4ML ~~LOC~~ SOLN
40.0000 mg | SUBCUTANEOUS | Status: DC
  Administered 2013-10-07 – 2013-10-09 (×3): 40 mg via SUBCUTANEOUS
  Filled 2013-10-07 (×5): qty 0.4

## 2013-10-07 NOTE — H&P (Signed)
[facility] History and Physical  Krislynn Gronau HAL:[id number] DOB: 16-Apr-1946 DOA: 10/07/2013  Referring physician: EDP PCP: Tammi Sou, MD   Chief Complaint: drowsiness  HPI: Brooke Mccullough is a 68 y.o. female [place] PMH COPD on home oxygen 3.5L at night and at daytime prn, DM type 2, IBS, OA, DDD, depression/anxiety, gout, HTN, HLD, R breast cancer, chronic back pain on long term narcotics who presents to the ER with lethargy, increased confusion since yesterday evening. History is obtained from the son who i called and discussed this with. She just moved to [place] from [place] in the end of May, she is slow, sedentary and weak at baseline, walks very little using a walker, she was admitted last week by the [facility] teaching service for weakness felt to be due to deconditioning, per son she usually gets admitted every 2 months or so in [place] with weakness, similar complaints. Yesterday afternoon she woke up from a nap and was very disoriented/couldnt stand or get OOB, this improved after an hour and was back to her baseline per her son who went to check on her. -then apparently went to bed, and this am woke up very confused, drowsy, unable to recognize her spouse or son, was very weak and they subsequently brought her to the ER. -IN ER, CT head and lab work unrevealing, VBG with resp acidosis, started on BIPAP, ABG pending, slight bump in creatinine and CXR with mild CHF.   Review of Systems:  Constitutional:  No weight loss, night sweats, Fevers, chills, fatigue.  HEENT:  No headaches, Difficulty swallowing,Tooth/dental problems,Sore throat,  No sneezing, itching, ear ache, nasal congestion, post nasal drip,  Cardio-vascular:  No chest pain, Orthopnea, PND, swelling in lower extremities, anasarca, dizziness, palpitations  GI:  No heartburn, indigestion, abdominal pain, nausea, vomiting, diarrhea, change in bowel habits, loss of appetite  Resp:  No shortness of breath with exertion  or at rest. No excess mucus, no productive cough, No non-productive cough, No coughing up of blood.No change in color of mucus.No wheezing.No chest wall deformity  Skin: no rash or lesions.  GU: no dysuria, change in color of urine, no urgency or frequency. No flank pain.  Musculoskeletal:  No joint pain or swelling. No decreased range of motion. No back pain.  Psych: Lethargy, confusion  Past Medical History  Diagnosis Date  . COPD (chronic obstructive pulmonary disease)   . Fibromyalgia     Dx'd 1993.  This has impaired her significantly.  . IBS (irritable bowel syndrome)   . Edema   . Gallstones     asymptomatic per pt (as of 09/30/2013)  . Hernia of abdominal wall   . Hypertension   . High cholesterol   . On home oxygen therapy     "3.5L @ night and during day prn" (09/28/2013)  . History of pneumonia     "once"  . Adrenal benign tumor   . Type II diabetes mellitus     hx of good control per pt report + HbA1c <7% June 2015.  Marland Kitchen Anemia   . GERD (gastroesophageal reflux disease)   . History of duodenal ulcer   . Daily headache   . Migraine     "@ least monthly" (09/28/2013)  . TIA (transient ischemic attack) 2012; 2014  . Degenerative disc disease     Cervical and lumbar  . Osteoarthritis (arthritis due to wear and tear of joints)     "back; knees; elbows; left ankle" (09/28/2013)  . Chronic lower back  pain     "L5-S1" (09/28/2013)  . Gout   . Anxiety   . Depression   . Breast cancer 2008    "right".  Lumpectomy + radiation+aromatase inhibitor (femara).  Regular f/u and mammos normal.  . Tremor     "upper extremities; last 6-7 months" (09/28/2013)   Past Surgical History  Procedure Laterality Date  . Lumbar laminectomy  1991; 1996  . Ankle fracture surgery Left 2013  . Total abdominal hysterectomy w/ bilateral salpingoophorectomy  1990  . Breast biopsy Right 2008  . Breast lumpectomy Right 2008  . Cataract extraction w/ intraocular lens implant Right 08/2013  . Cardiac  catheterization  2000's X 2  . Bladder tack     Social History:  reports that she quit smoking about 4 weeks ago. Her smoking use included Cigarettes. She has a 36 pack-year smoking history. She has never used smokeless tobacco. She reports that she does not drink alcohol or use illicit drugs.  Allergies  Allergen Reactions  . Clindamycin/Lincomycin Rash  . Fentanyl Rash    Duragesic Patch  . Indocin [Indomethacin] Rash  . Keflex [Cephalexin] Rash  . Penicillins Rash  . Vancomycin Rash    Family History  Problem Relation Age of Onset  . Arthritis Mother   . Breast cancer Mother   . Hyperlipidemia Mother   . Stroke Mother   . Hypertension Mother   . Mental illness Mother   . Arthritis Father   . Cancer Father   . Hyperlipidemia Father   . Heart disease Father   . Heart disease Maternal Grandmother   . Hyperlipidemia Maternal Grandmother   . Heart disease Paternal Grandmother   . Hypertension Paternal Grandmother   . Diabetes Paternal Grandmother   . Arthritis Paternal Grandfather      Prior to Admission medications   Medication Sig Start Date End Date Taking? Authorizing Reilly Molchan  ALPRAZolam Duanne Moron) 0.5 MG tablet Take 0.5 mg by mouth 3 (three) times daily as needed for anxiety or sleep.   Yes Historical Tasha Jindra, MD  aspirin EC 81 MG tablet Take 81 mg by mouth daily.   Yes Historical Loryn Haacke, MD  atorvastatin (LIPITOR) 40 MG tablet Take 40 mg by mouth daily.   Yes Historical Xadrian Craighead, MD  calcium gluconate 500 MG tablet Take 2 tablets by mouth daily.   Yes Historical Elese Rane, MD  Cholecalciferol (VITAMIN D-3 PO) Take 1 tablet by mouth daily.   Yes Historical Karri Kallenbach, MD  clopidogrel (PLAVIX) 75 MG tablet Take 75 mg by mouth daily.   Yes Historical Donta Mcinroy, MD  ferrous sulfate 325 (65 FE) MG tablet Take 325 mg by mouth daily with breakfast.   Yes Historical Tracie Lindbloom, MD  furosemide (LASIX) 40 MG tablet Take 80 mg by mouth daily.   Yes Historical Niesha Bame, MD  ibuprofen  (ADVIL,MOTRIN) 200 MG tablet Take 400 mg by mouth every 4 (four) hours as needed (for pain).   Yes Historical Mellony Danziger, MD  insulin aspart (NOVOLOG) 100 UNIT/ML injection Inject 10 Units into the skin 3 (three) times daily before meals.   Yes Historical Serigne Kubicek, MD  insulin glargine (LANTUS) 100 UNIT/ML injection Inject 43 Units into the skin 2 (two) times daily.   Yes Historical Tanvi Gatling, MD  letrozole (FEMARA) 2.5 MG tablet Take 2.5 mg by mouth daily.   Yes Historical Umi Mainor, MD  levothyroxine (SYNTHROID, LEVOTHROID) 100 MCG tablet Take 100 mcg by mouth daily before breakfast.   Yes Historical Duran Ohern, MD  lisinopril (PRINIVIL,ZESTRIL) 10 MG tablet Take  10-20 mg by mouth 2 (two) times daily. 10 mg in am and 20 mg in pm   Yes Historical Navi Ewton, MD  Magnesium Oxide 500 MG (LAX) TABS Take 500 mg by mouth 2 (two) times daily.   Yes Historical Tanishia Lemaster, MD  meclizine (ANTIVERT) 25 MG tablet Take 25 mg by mouth 3 (three) times daily as needed for dizziness.   Yes Historical Maurilio Puryear, MD  metoCLOPramide (REGLAN) 10 MG tablet Take 10 mg by mouth 3 (three) times daily.   Yes Historical Doreather Hoxworth, MD  morphine (MS CONTIN) 30 MG 12 hr tablet Take 30 mg by mouth every 12 (twelve) hours.   Yes Historical Nicolemarie Wooley, MD  Multiple Vitamin (MULTIVITAMIN WITH MINERALS) TABS tablet Take 1 tablet by mouth daily.   Yes Historical Arjun Hard, MD  omeprazole (PRILOSEC) 40 MG capsule Take 40 mg by mouth daily.   Yes Historical Mckinnon Glick, MD  potassium chloride SA (K-DUR,KLOR-CON) 20 MEQ tablet Take 20 mEq by mouth daily.   Yes Historical Lura Falor, MD  pregabalin (LYRICA) 75 MG capsule Take 150 mg by mouth every 6 (six) hours.    Yes Historical Autumn Gunn, MD  rOPINIRole (REQUIP) 1 MG tablet Take 2 mg by mouth at bedtime.   Yes Historical Morgen Linebaugh, MD  sertraline (ZOLOFT) 50 MG tablet Take 50 mg by mouth daily.   Yes Historical Keyshon Stein, MD  tiotropium (SPIRIVA) 18 MCG inhalation capsule Place 18 mcg into inhaler and inhale  daily.   Yes Historical Natia Fahmy, MD  traMADol (ULTRAM) 50 MG tablet Take 100 mg by mouth every 6 (six) hours as needed for moderate pain.    Yes Historical Albertha Beattie, MD   Physical Exam: Filed Vitals:   10/07/13 1556  BP: 115/41  Pulse: 75  Temp:   Resp: 19    BP 115/41  Pulse 75  Temp(Src) 98.5 F (36.9 C) (Rectal)  Resp 19  SpO2 98%  General:  Drowsy, arousible to painful stimuli, on BIPAP, mumbles few words and falls back to sleep Eyes: PERRL, normal lids, irises & conjunctiva ENT: grossly normal hearing, lips & tongue Neck: no LAD, masses or thyromegaly Cardiovascular: RRR, no m/r/g. 1-2plus edema. Respiratory: poor air movement B/L no w/r/r. Normal respiratory effort. Abdomen: soft, ntnd Skin: no rash or induration seen on limited exam Musculoskeletal: grossly normal tone BUE/BLE Psychiatric: unable to assess Neurologic: moves all extremities, but both lower legs weaker and poor effort too DTR diminished, plantars mute          Labs on Admission:  Basic Metabolic Panel:  Recent Labs Lab 10/07/13 1316  NA 141  K 4.5  CL 102  CO2 21  GLUCOSE 166*  BUN 82*  CREATININE 1.65*  CALCIUM 8.6   Liver Function Tests:  Recent Labs Lab 10/07/13 1316  AST 14  ALT 16  ALKPHOS 120*  BILITOT 0.2*  PROT 6.4  ALBUMIN 2.8*   No results found for this basename: LIPASE, AMYLASE,  in the last 168 hours  Recent Labs Lab 10/07/13 1316  AMMONIA 57   CBC:  Recent Labs Lab 10/07/13 1316  WBC 13.0*  NEUTROABS 11.4*  HGB 11.5*  HCT 37.1  MCV 85.3  PLT 128*   Cardiac Enzymes: No results found for this basename: CKTOTAL, CKMB, CKMBINDEX, TROPONINI,  in the last 168 hours  BNP (last 3 results)  Recent Labs  09/28/13 1304  PROBNP 356.8*   CBG:  Recent Labs Lab 10/07/13 1313  GLUCAP 161*    Radiological Exams on Admission: Dg Chest 1 View  10/07/2013   CLINICAL DATA:  Weakness and hypoglycemia  EXAM: CHEST - 1 VIEW  COMPARISON:  PA and lateral  chest of September 28, 2013  FINDINGS: There is chronic volume loss on the right. The interstitial markings are increased bilaterally. The pulmonary vascularity is engorged. The cardiac silhouette is enlarged. There is no pleural effusion. The bony thorax is unremarkable.  IMPRESSION: Low grade CHF with mild interstitial edema.   Electronically Signed   By: David  Martinique   On: 10/07/2013 14:07   Ct Head Wo Contrast  10/07/2013   CLINICAL DATA:  Tremors, weakness, uncontrolled jerking movements for the last 2 weeks.  EXAM: CT HEAD WITHOUT CONTRAST  CT CERVICAL SPINE WITHOUT CONTRAST  TECHNIQUE: Multidetector CT imaging of the head and cervical spine was performed following the standard protocol without intravenous contrast. Multiplanar CT image reconstructions of the cervical spine were also generated.  COMPARISON:  September 28, 2013  FINDINGS: CT HEAD FINDINGS  There is no midline shift, hydrocephalus, or mass. No acute hemorrhage or acute transcortical infarct is identified. There is chronic diffuse atrophy. There is no evidence of skull fracture. Small right maxillary sinus retention cyst is noted  CT CERVICAL SPINE FINDINGS  There is no acute fracture or dislocation. The prevertebral soft tissues are normal. There are degenerative joint changes with narrowed joint space and osteophyte formation from C5 through C7. Minimal anterior osteophytosis is identified C2 unchanged. Right thyroid gland enlargement compared to the left is identified unchanged. Ultrasound of thyroid on outpatient basis is as suggested on September 28, 2013. Patchy airspace opacity is identified in the visualized right upper lobe.  IMPRESSION: No focal acute intracranial abnormality identified.  Chronic diffuse atrophy.  No acute fracture or dislocation of cervical spine. Degenerative joint changes of cervical spine.  Patchy airspace opacity in the right upper lobe, further evaluation with chest x-ray is recommended.   Electronically Signed   By: Abelardo Diesel M.D.   On: 10/07/2013 14:15   Ct Cervical Spine Wo Contrast  10/07/2013   CLINICAL DATA:  Tremors, weakness, uncontrolled jerking movements for the last 2 weeks.  EXAM: CT HEAD WITHOUT CONTRAST  CT CERVICAL SPINE WITHOUT CONTRAST  TECHNIQUE: Multidetector CT imaging of the head and cervical spine was performed following the standard protocol without intravenous contrast. Multiplanar CT image reconstructions of the cervical spine were also generated.  COMPARISON:  September 28, 2013  FINDINGS: CT HEAD FINDINGS  There is no midline shift, hydrocephalus, or mass. No acute hemorrhage or acute transcortical infarct is identified. There is chronic diffuse atrophy. There is no evidence of skull fracture. Small right maxillary sinus retention cyst is noted  CT CERVICAL SPINE FINDINGS  There is no acute fracture or dislocation. The prevertebral soft tissues are normal. There are degenerative joint changes with narrowed joint space and osteophyte formation from C5 through C7. Minimal anterior osteophytosis is identified C2 unchanged. Right thyroid gland enlargement compared to the left is identified unchanged. Ultrasound of thyroid on outpatient basis is as suggested on September 28, 2013. Patchy airspace opacity is identified in the visualized right upper lobe.  IMPRESSION: No focal acute intracranial abnormality identified.  Chronic diffuse atrophy.  No acute fracture or dislocation of cervical spine. Degenerative joint changes of cervical spine.  Patchy airspace opacity in the right upper lobe, further evaluation with chest x-ray is recommended.   Electronically Signed   By: Abelardo Diesel M.D.   On: 10/07/2013 14:15    EKG: Independently reviewed. Anteroseptal Q waves  unchanged from prior  Assessment/Plan  1. Metabolic encephalopathy -suspect based on underlying COPD, could have OSA too -worsened by narcotics/benzos/lyrica etc -check STAT ABG, started on BIPAP per EDP based on VBG -will hold above meds, resume soon  as tolerated at lower dose to prevent withdrawals -mild CHF too, based on CXR, check BNP: diurese as tolerated -Needs Sleep study as outpatient  2. COPD -on 3.5L home O2 QHS -poor air movement -duonebs scheduled for now and PRN -per Son quit smoking 4 weeks ago  3. Possible CHF/likely diastolic -Diurese as tolerated, watch creatinine  4. AKI on CKD -? Cardiorenal/ACE -hold ACE, diurese today and monitor  5. DM -resume lantus at lower dose whicl NPO -SSI  6. Chronic back pain -with some R leg numbness/weakness at baseline  DVT proph: lovenox  Code Status: FUll COde per son Family Communication: none at bedside, called and d/w son Julienne Kass Disposition Plan: admit to SDU  Time spent: 100min  JOSEPH,PREETHA [facility] Pager [phone]  **Disclaimer: This note may have been dictated with voice recognition software. Similar sounding words can inadvertently be transcribed and this note may contain transcription errors which may not have been corrected upon publication of note.**

## 2013-10-07 NOTE — Progress Notes (Signed)
**Note De-Identified  Obfuscation** RT note: ABG critical reported to [place]

## 2013-10-07 NOTE — ED Notes (Signed)
Brooke Mccullough, [place] notified of patients BP, verbal order to hang 500cc fluids. Fluids hanging at this time.

## 2013-10-07 NOTE — ED Provider Notes (Signed)
CSN: [id number]     Arrival date & time 10/07/13  1129 History   First MD Initiated Contact with Patient 10/07/13 1236     Chief Complaint  Patient presents with  . Tremors  . Weakness     (Consider location/radiation/quality/duration/timing/severity/associated sxs/prior Treatment) HPI  Brooke Mccullough is a 68 y.o. female with past medical history significant for COPD (on home O2), hypertension, high cholesterol, insulin-dependent diabetes, TIA who resides in assisted living facility with her husband complaining of worsening weakness, confusion (not recognize her husband this morning and does not recognize her son now)and jerking movements worsening over the course of 2 weeks. Jerking used to be in the hands, now it is over the whole body. Patient remained conscious throughout episodes, no loss of bowel or bladder control. Patient has fallen twice in the last 24 hours due to jerking. Denies head trauma. Denies fever, chills, headache, change in vision, nausea vomiting, change in bowel or bladder habits. States that she has difficulty walking but that secondary to weakness. Patient takes 30 mg of morphine twice a day, she self administers the medication. Patient's daughter-in-law is a Marine scientist and has counted the pills and states that she is more than what she needs so overdoses unlikely. Denies any alcohol or illicit drug use. As per her son: patient is sedentary and takes multiple narcotics for pain control in addition to Klonopin. Recently moved to the area to be closer to her son. Just establish primary care..   Past Medical History  Diagnosis Date  . COPD (chronic obstructive pulmonary disease)   . Fibromyalgia     Dx'd 1993.  This has impaired her significantly.  . IBS (irritable bowel syndrome)   . Edema   . Gallstones     asymptomatic per pt (as of 09/30/2013)  . Hernia of abdominal wall   . Hypertension   . High cholesterol   . On home oxygen therapy     "3.5L @ night and during  day prn" (09/28/2013)  . History of pneumonia     "once"  . Adrenal benign tumor   . Type II diabetes mellitus     hx of good control per pt report + HbA1c <7% June 2015.  Marland Kitchen Anemia   . GERD (gastroesophageal reflux disease)   . History of duodenal ulcer   . Daily headache   . Migraine     "@ least monthly" (09/28/2013)  . TIA (transient ischemic attack) 2012; 2014  . Degenerative disc disease     Cervical and lumbar  . Osteoarthritis (arthritis due to wear and tear of joints)     "back; knees; elbows; left ankle" (09/28/2013)  . Chronic lower back pain     "L5-S1" (09/28/2013)  . Gout   . Anxiety   . Depression   . Breast cancer 2008    "right".  Lumpectomy + radiation+aromatase inhibitor (femara).  Regular f/u and mammos normal.  . Tremor     "upper extremities; last 6-7 months" (09/28/2013)   Past Surgical History  Procedure Laterality Date  . Lumbar laminectomy  1991; 1996  . Ankle fracture surgery Left 2013  . Total abdominal hysterectomy w/ bilateral salpingoophorectomy  1990  . Breast biopsy Right 2008  . Breast lumpectomy Right 2008  . Cataract extraction w/ intraocular lens implant Right 08/2013  . Cardiac catheterization  2000's X 2  . Bladder tack     Family History  Problem Relation Age of Onset  . Arthritis Mother   .  Breast cancer Mother   . Hyperlipidemia Mother   . Stroke Mother   . Hypertension Mother   . Mental illness Mother   . Arthritis Father   . Cancer Father   . Hyperlipidemia Father   . Heart disease Father   . Heart disease Maternal Grandmother   . Hyperlipidemia Maternal Grandmother   . Heart disease Paternal Grandmother   . Hypertension Paternal Grandmother   . Diabetes Paternal Grandmother   . Arthritis Paternal Grandfather    History  Substance Use Topics  . Smoking status: Former Smoker -- 1.00 packs/day for 36 years    Types: Cigarettes    Quit date: 09/09/2013  . Smokeless tobacco: Never Used  . Alcohol Use: No   OB History   Grav  Para Term Preterm Abortions TAB SAB Ect Mult Living                 Review of Systems  10 systems reviewed and found to be negative, except as noted in the HPI.   Allergies  Clindamycin/lincomycin; Fentanyl; Indocin; Keflex; Penicillins; and Vancomycin  Home Medications   Prior to Admission medications   Medication Sig Start Date End Date Taking? Authorizing Provider  ALPRAZolam Duanne Moron) 0.5 MG tablet Take 0.5 mg by mouth 3 (three) times daily as needed for anxiety or sleep.   Yes Historical Provider, MD  aspirin EC 81 MG tablet Take 81 mg by mouth daily.   Yes Historical Provider, MD  atorvastatin (LIPITOR) 40 MG tablet Take 40 mg by mouth daily.   Yes Historical Provider, MD  calcium gluconate 500 MG tablet Take 2 tablets by mouth daily.   Yes Historical Provider, MD  Cholecalciferol (VITAMIN D-3 PO) Take 1 tablet by mouth daily.   Yes Historical Provider, MD  clopidogrel (PLAVIX) 75 MG tablet Take 75 mg by mouth daily.   Yes Historical Provider, MD  ferrous sulfate 325 (65 FE) MG tablet Take 325 mg by mouth daily with breakfast.   Yes Historical Provider, MD  furosemide (LASIX) 40 MG tablet Take 80 mg by mouth daily.   Yes Historical Provider, MD  ibuprofen (ADVIL,MOTRIN) 200 MG tablet Take 400 mg by mouth every 4 (four) hours as needed (for pain).   Yes Historical Provider, MD  insulin aspart (NOVOLOG) 100 UNIT/ML injection Inject 10 Units into the skin 3 (three) times daily before meals.   Yes Historical Provider, MD  insulin glargine (LANTUS) 100 UNIT/ML injection Inject 43 Units into the skin 2 (two) times daily.   Yes Historical Provider, MD  letrozole (FEMARA) 2.5 MG tablet Take 2.5 mg by mouth daily.   Yes Historical Provider, MD  levothyroxine (SYNTHROID, LEVOTHROID) 100 MCG tablet Take 100 mcg by mouth daily before breakfast.   Yes Historical Provider, MD  lisinopril (PRINIVIL,ZESTRIL) 10 MG tablet Take 10-20 mg by mouth 2 (two) times daily. 10 mg in am and 20 mg in pm   Yes  Historical Provider, MD  Magnesium Oxide 500 MG (LAX) TABS Take 500 mg by mouth 2 (two) times daily.   Yes Historical Provider, MD  meclizine (ANTIVERT) 25 MG tablet Take 25 mg by mouth 3 (three) times daily as needed for dizziness.   Yes Historical Provider, MD  metoCLOPramide (REGLAN) 10 MG tablet Take 10 mg by mouth 3 (three) times daily.   Yes Historical Provider, MD  morphine (MS CONTIN) 30 MG 12 hr tablet Take 30 mg by mouth every 12 (twelve) hours.   Yes Historical Provider, MD  Multiple  Vitamin (MULTIVITAMIN WITH MINERALS) TABS tablet Take 1 tablet by mouth daily.   Yes Historical Provider, MD  omeprazole (PRILOSEC) 40 MG capsule Take 40 mg by mouth daily.   Yes Historical Provider, MD  potassium chloride SA (K-DUR,KLOR-CON) 20 MEQ tablet Take 20 mEq by mouth daily.   Yes Historical Provider, MD  pregabalin (LYRICA) 75 MG capsule Take 150 mg by mouth every 6 (six) hours.    Yes Historical Provider, MD  rOPINIRole (REQUIP) 1 MG tablet Take 2 mg by mouth at bedtime.   Yes Historical Provider, MD  sertraline (ZOLOFT) 50 MG tablet Take 50 mg by mouth daily.   Yes Historical Provider, MD  tiotropium (SPIRIVA) 18 MCG inhalation capsule Place 18 mcg into inhaler and inhale daily.   Yes Historical Provider, MD  traMADol (ULTRAM) 50 MG tablet Take 100 mg by mouth every 6 (six) hours as needed for moderate pain.    Yes Historical Provider, MD   BP 149/45  Pulse 79  Temp(Src) 98.5 F (36.9 C) (Rectal)  Resp 16  SpO2 99% Physical Exam  Nursing note and vitals reviewed. Constitutional: She is oriented to person, place, and time. She appears well-developed and well-nourished. No distress.  disheveled   HENT:  Head: Normocephalic and atraumatic.  Dry MM  Eyes: Conjunctivae and EOM are normal. Pupils are equal, round, and reactive to light.  Neck: Normal range of motion. Neck supple.  Cardiovascular: Normal rate.   Pulmonary/Chest: Effort normal. No stridor.  Poor air movement in all  fieldsespecially at the bases, no wheezing  Abdominal: Soft. Bowel sounds are normal. She exhibits no distension and no mass. There is no tenderness. There is no rebound and no guarding.  Musculoskeletal: Normal range of motion.  Neurological: She is alert and oriented to person, place, and time.  Correctly answers each year and president, when asked what town she is in she answers with the year.  Psychiatric: She has a normal mood and affect.    ED Course  Procedures (including critical care time) Labs Review Labs Reviewed  CBC WITH DIFFERENTIAL - Abnormal; Notable for the following:    WBC 13.0 (*)    Hemoglobin 11.5 (*)    Platelets 128 (*)    Neutrophils Relative % 88 (*)    Neutro Abs 11.4 (*)    Lymphocytes Relative 7 (*)    All other components within normal limits  BASIC METABOLIC PANEL - Abnormal; Notable for the following:    Glucose, Bld 166 (*)    BUN 82 (*)    Creatinine, Ser 1.65 (*)    GFR calc non Af Amer 31 (*)    GFR calc Af Amer 36 (*)    All other components within normal limits  HEPATIC FUNCTION PANEL - Abnormal; Notable for the following:    Albumin 2.8 (*)    Alkaline Phosphatase 120 (*)    Total Bilirubin 0.2 (*)    All other components within normal limits  URINE RAPID DRUG SCREEN (HOSP PERFORMED) - Abnormal; Notable for the following:    Opiates POSITIVE (*)    Benzodiazepines POSITIVE (*)    All other components within normal limits  CBG MONITORING, ED - Abnormal; Notable for the following:    Glucose-Capillary 161 (*)    All other components within normal limits  I-STAT VENOUS BLOOD GAS, ED - Abnormal; Notable for the following:    pH, Ven 7.223 (*)    pCO2, Ven 65.2 (*)    Bicarbonate 26.9 (*)  All other components within normal limits  I-STAT ARTERIAL BLOOD GAS, ED - Abnormal; Notable for the following:    pH, Arterial 7.207 (*)    pCO2 arterial 63.6 (*)    pO2, Arterial 109.0 (*)    Bicarbonate 25.3 (*)    Acid-base deficit 4.0 (*)     All other components within normal limits  PROTIME-INR  AMMONIA  LACTIC ACID, PLASMA  URINALYSIS, ROUTINE W REFLEX MICROSCOPIC  TSH  BLOOD GAS, VENOUS  PRO B NATRIURETIC PEPTIDE  BLOOD GAS, ARTERIAL  CBG MONITORING, ED    Imaging Review Dg Chest 1 View  10/07/2013   CLINICAL DATA:  Weakness and hypoglycemia  EXAM: CHEST - 1 VIEW  COMPARISON:  PA and lateral chest of September 28, 2013  FINDINGS: There is chronic volume loss on the right. The interstitial markings are increased bilaterally. The pulmonary vascularity is engorged. The cardiac silhouette is enlarged. There is no pleural effusion. The bony thorax is unremarkable.  IMPRESSION: Low grade CHF with mild interstitial edema.   Electronically Signed   By: David  Martinique   On: 10/07/2013 14:07   Ct Head Wo Contrast  10/07/2013   CLINICAL DATA:  Tremors, weakness, uncontrolled jerking movements for the last 2 weeks.  EXAM: CT HEAD WITHOUT CONTRAST  CT CERVICAL SPINE WITHOUT CONTRAST  TECHNIQUE: Multidetector CT imaging of the head and cervical spine was performed following the standard protocol without intravenous contrast. Multiplanar CT image reconstructions of the cervical spine were also generated.  COMPARISON:  September 28, 2013  FINDINGS: CT HEAD FINDINGS  There is no midline shift, hydrocephalus, or mass. No acute hemorrhage or acute transcortical infarct is identified. There is chronic diffuse atrophy. There is no evidence of skull fracture. Small right maxillary sinus retention cyst is noted  CT CERVICAL SPINE FINDINGS  There is no acute fracture or dislocation. The prevertebral soft tissues are normal. There are degenerative joint changes with narrowed joint space and osteophyte formation from C5 through C7. Minimal anterior osteophytosis is identified C2 unchanged. Right thyroid gland enlargement compared to the left is identified unchanged. Ultrasound of thyroid on outpatient basis is as suggested on September 28, 2013. Patchy airspace opacity is  identified in the visualized right upper lobe.  IMPRESSION: No focal acute intracranial abnormality identified.  Chronic diffuse atrophy.  No acute fracture or dislocation of cervical spine. Degenerative joint changes of cervical spine.  Patchy airspace opacity in the right upper lobe, further evaluation with chest x-ray is recommended.   Electronically Signed   By: Abelardo Diesel M.D.   On: 10/07/2013 14:15   Ct Cervical Spine Wo Contrast  10/07/2013   CLINICAL DATA:  Tremors, weakness, uncontrolled jerking movements for the last 2 weeks.  EXAM: CT HEAD WITHOUT CONTRAST  CT CERVICAL SPINE WITHOUT CONTRAST  TECHNIQUE: Multidetector CT imaging of the head and cervical spine was performed following the standard protocol without intravenous contrast. Multiplanar CT image reconstructions of the cervical spine were also generated.  COMPARISON:  September 28, 2013  FINDINGS: CT HEAD FINDINGS  There is no midline shift, hydrocephalus, or mass. No acute hemorrhage or acute transcortical infarct is identified. There is chronic diffuse atrophy. There is no evidence of skull fracture. Small right maxillary sinus retention cyst is noted  CT CERVICAL SPINE FINDINGS  There is no acute fracture or dislocation. The prevertebral soft tissues are normal. There are degenerative joint changes with narrowed joint space and osteophyte formation from C5 through C7. Minimal anterior osteophytosis is identified C2  unchanged. Right thyroid gland enlargement compared to the left is identified unchanged. Ultrasound of thyroid on outpatient basis is as suggested on September 28, 2013. Patchy airspace opacity is identified in the visualized right upper lobe.  IMPRESSION: No focal acute intracranial abnormality identified.  Chronic diffuse atrophy.  No acute fracture or dislocation of cervical spine. Degenerative joint changes of cervical spine.  Patchy airspace opacity in the right upper lobe, further evaluation with chest x-ray is recommended.    Electronically Signed   By: Abelardo Diesel M.D.   On: 10/07/2013 14:15     EKG Interpretation   Date/Time:  Wednesday October 07 2013 11:44:03 EDT Ventricular Rate:  84 PR Interval:  155 QRS Duration: 88 QT Interval:  341 QTC Calculation: 403 R Axis:   11 Text Interpretation:  Sinus rhythm Probable anteroseptal infarct, recent  No significant change since last tracing Confirmed by GOLDSTON  MD, SCOTT  ([id number]) on 10/07/2013 12:33:25 PM      MDM   Final diagnoses:  Confusion  Respiratory acidosis    Filed Vitals:   10/07/13 1527 10/07/13 1556 10/07/13 1658 10/07/13 1700  BP: 108/40 115/41 152/54 149/45  Pulse: 75 75 77 79  Temp:      TempSrc:      Resp: 15 19 15 16   SpO2: 97% 98% 99% 99%    Medications  0.9 %  sodium chloride infusion (0 mLs Intravenous Stopped 10/07/13 1547)    Followed by  0.9 %  sodium chloride infusion (0 mLs Intravenous Stopped 10/07/13 1622)    Brooke Mccullough is a 68 y.o. female presenting with confusion, jerking sensation and weakness. Neuro exam is nonfocal. Patient is oriented x2. She appears clinically very dehydrated however her husband states that she is drinking over a gallon a day.    Pt is not hypoglycemic, head CT with no acute abnormalities. Doubt acute opiate overdose based on the slow decline in function.  VBG c/w respiratory acidosis. Pt is likely retaining CO2 in combination of worsening COPD secondary to respiratory depression from narcotics. Pt placed on BiPAP.   This is a shared visit with the attending physician who personally evaluated the patient and agrees with the care plan.  Pt will be admitted to [facility] hospital[facility] who requests ABG, which confirms  Respiratory acidosis.      Monico Blitz, PA-C 10/13/13 1531

## 2013-10-07 NOTE — ED Notes (Signed)
Per ems, pt from [place]. For the last two weeks patient has had uncontrolled episodes of jerky movements. Pt nods off and has twitchy movements. Stays conscious and alert throughout episodes. Pt was seen 9 days ago for same issue, full workup, CT was negative. Pt has extensive medical history. Pt also had back surgery where they hit sciatic nerve on right side so pt has decreased sensation to right leg. Pt is AAOX4, in NAD. Denies pain.

## 2013-10-07 NOTE — ED Notes (Addendum)
IV Fluids stopped per verbal order of Admitting MD. Respiratory contacted for Stat ABG at request of Admitting MD.

## 2013-10-07 NOTE — ED Notes (Signed)
Pt is asleep

## 2013-10-08 DIAGNOSIS — E139 Other specified diabetes mellitus without complications: Secondary | ICD-10-CM

## 2013-10-08 LAB — COMPREHENSIVE METABOLIC PANEL
ALK PHOS: 114 U/L (ref 39–117)
ALT: 16 U/L (ref 0–35)
AST: 18 U/L (ref 0–37)
Albumin: 2.8 g/dL — ABNORMAL LOW (ref 3.5–5.2)
BUN: 82 mg/dL — AB (ref 6–23)
CO2: 23 meq/L (ref 19–32)
Calcium: 9.1 mg/dL (ref 8.4–10.5)
Chloride: 103 mEq/L (ref 96–112)
Creatinine, Ser: 1.48 mg/dL — ABNORMAL HIGH (ref 0.50–1.10)
GFR, EST AFRICAN AMERICAN: 41 mL/min — AB (ref >90)
GFR, EST NON AFRICAN AMERICAN: 35 mL/min — AB (ref >90)
Glucose, Bld: 132 mg/dL — ABNORMAL HIGH (ref 70–99)
Potassium: 4 mEq/L (ref 3.7–5.3)
SODIUM: 144 meq/L (ref 137–147)
Total Bilirubin: 0.2 mg/dL — ABNORMAL LOW (ref 0.3–1.2)
Total Protein: 6.3 g/dL (ref 6.0–8.3)

## 2013-10-08 LAB — CBC
HCT: 33.8 % — ABNORMAL LOW (ref 36.0–46.0)
HEMOGLOBIN: 10.8 g/dL — AB (ref 12.0–15.0)
MCH: 26.6 pg (ref 26.0–34.0)
MCHC: 32 g/dL (ref 30.0–36.0)
MCV: 83.3 fL (ref 78.0–100.0)
Platelets: 132 10*3/uL — ABNORMAL LOW (ref 150–400)
RBC: 4.06 MIL/uL (ref 3.87–5.11)
RDW: 14.8 % (ref 11.5–15.5)
WBC: 10.7 10*3/uL — ABNORMAL HIGH (ref 4.0–10.5)

## 2013-10-08 LAB — GLUCOSE, CAPILLARY
GLUCOSE-CAPILLARY: 146 mg/dL — AB (ref 70–99)
Glucose-Capillary: 259 mg/dL — ABNORMAL HIGH (ref 70–99)
Glucose-Capillary: 162 mg/dL — ABNORMAL HIGH (ref 70–99)
Glucose-Capillary: 165 mg/dL — ABNORMAL HIGH (ref 70–99)

## 2013-10-08 LAB — TROPONIN I: Troponin I: <0.3 ng/mL (ref <0.30)

## 2013-10-08 MED ORDER — INSULIN GLARGINE 100 UNIT/ML ~~LOC~~ SOLN
35.0000 [IU] | Freq: Two times a day (BID) | SUBCUTANEOUS | Status: DC
  Administered 2013-10-08 – 2013-10-10 (×5): 35 [IU] via SUBCUTANEOUS
  Filled 2013-10-08 (×7): qty 0.35

## 2013-10-08 MED ORDER — ONDANSETRON HCL 4 MG/2ML IJ SOLN
4.0000 mg | Freq: Four times a day (QID) | INTRAMUSCULAR | Status: DC | PRN
  Administered 2013-10-08 – 2013-10-10 (×4): 4 mg via INTRAVENOUS
  Filled 2013-10-08 (×4): qty 2

## 2013-10-08 MED ORDER — MORPHINE SULFATE ER 15 MG PO TBCR
15.0000 mg | EXTENDED_RELEASE_TABLET | Freq: Two times a day (BID) | ORAL | Status: DC
  Administered 2013-10-08 – 2013-10-10 (×5): 15 mg via ORAL
  Filled 2013-10-08 (×5): qty 1

## 2013-10-08 MED ORDER — PREGABALIN 75 MG PO CAPS
75.0000 mg | ORAL_CAPSULE | Freq: Two times a day (BID) | ORAL | Status: DC
  Administered 2013-10-08 – 2013-10-10 (×5): 75 mg via ORAL
  Filled 2013-10-08 (×5): qty 1

## 2013-10-08 MED ORDER — FUROSEMIDE 10 MG/ML IJ SOLN
20.0000 mg | Freq: Two times a day (BID) | INTRAMUSCULAR | Status: DC
  Administered 2013-10-08 – 2013-10-09 (×2): 20 mg via INTRAVENOUS
  Filled 2013-10-08 (×2): qty 2

## 2013-10-08 NOTE — Progress Notes (Addendum)
[facility] PROGRESS NOTE  Brooke Mccullough ZOX:[id number] DOB: [birth year] DOA: 10/07/2013 PCP: Tammi Sou, MD  Assessment/Plan: 1. Metabolic encephalopathy due to hypercarbia -suspect based on underlying COPD, could have OSA too  -worsened by narcotics/benzos/lyrica etc  -Continue  BIPAP QHS - resume MSContin at lower dose and lyrica to prevent withdrawals  -mild CHF too, based on CXR,  -cut down lasix  -Needs Sleep study as outpatient   2. COPD  -on 3.5L home O2 QHS  -poor air movement  -duonebs scheduled for now and PRN  -per Son quit smoking 4 weeks ago   3. Possible CHF/likely diastolic  -Diurese as tolerated, watch creatinine  -ECHO pending  4. AKI on CKD  -? Cardiorenal/ACE  -hold ACE, diurese today and monitor   5. DM  -increase lantus dose -SSI   6. Chronic back pain  -with some R leg numbness/weakness at baseline   DVT proph: lovenox   Ambulate, Pt/OT  Code Status: Full COde Family Communication: none at bedside Disposition Plan: Keep in SDU today   HPI/Subjective: Feels weak, no other complaints  Objective: Filed Vitals:   10/08/13 1206  BP: 146/43  Pulse: 102  Temp: 98.1 F (36.7 C)  Resp: 18    Intake/Output Summary (Last 24 hours) at 10/08/13 1246 Last data filed at 10/08/13 1206  Gross per 24 hour  Intake 376.25 ml  Output   3150 ml  Net -2773.75 ml   Filed Weights   10/07/13 1715 10/07/13 1800  Weight: 94.3 kg (207 lb 14.3 oz) 95.9 kg (211 lb 6.7 oz)    Exam:   General:  Alert, awake, oriented to self, place  Cardiovascular: S1S2/RRR  Respiratory: CTAB  Abdomen: soft, Nt, BS present  Musculoskeletal: trace edema no c/c   Data Reviewed: Basic Metabolic Panel:  Recent Labs Lab 10/07/13 1316 10/08/13  NA 141 144  K 4.5 4.0  CL 102 103  CO2 21 23  GLUCOSE 166* 132*  BUN 82* 82*  CREATININE 1.65* 1.48*  CALCIUM 8.6 9.1   Liver Function Tests:  Recent Labs Lab 10/07/13 1316 10/08/13  AST 14  18  ALT 16 16  ALKPHOS 120* 114  BILITOT 0.2* 0.2*  PROT 6.4 6.3  ALBUMIN 2.8* 2.8*   No results found for this basename: LIPASE, AMYLASE,  in the last 168 hours  Recent Labs Lab 10/07/13 1316  AMMONIA 57   CBC:  Recent Labs Lab 10/07/13 1316 10/08/13  WBC 13.0* 10.7*  NEUTROABS 11.4*  --   HGB 11.5* 10.8*  HCT 37.1 33.8*  MCV 85.3 83.3  PLT 128* 132*   Cardiac Enzymes:  Recent Labs Lab 10/07/13 1908 10/08/13 0001  TROPONINI <0.30 <0.30   BNP (last 3 results)  Recent Labs  09/28/13 1304 10/07/13 1908  PROBNP 356.8* 919.7*   CBG:  Recent Labs Lab 10/07/13 1313 10/07/13 2008 10/08/13 0749 10/08/13 1209  GLUCAP 161* 143* 146* 259*    Recent Results (from the past 240 hour(s))  MRSA PCR SCREENING     Status: None   Collection Time    10/07/13  6:28 PM      Result Value Ref Range Status   MRSA by PCR NEGATIVE  NEGATIVE Final   Comment:            The GeneXpert MRSA Assay (FDA     approved for NASAL specimens     only), is one component of a     comprehensive MRSA colonization     surveillance program.  It is not     intended to diagnose MRSA     infection nor to guide or     monitor treatment for     MRSA infections.     Studies: Dg Chest 1 View  10/07/2013   CLINICAL DATA:  Weakness and hypoglycemia  EXAM: CHEST - 1 VIEW  COMPARISON:  PA and lateral chest of September 28, 2013  FINDINGS: There is chronic volume loss on the right. The interstitial markings are increased bilaterally. The pulmonary vascularity is engorged. The cardiac silhouette is enlarged. There is no pleural effusion. The bony thorax is unremarkable.  IMPRESSION: Low grade CHF with mild interstitial edema.   Electronically Signed   By: David  Martinique   On: 10/07/2013 14:07   Ct Head Wo Contrast  10/07/2013   CLINICAL DATA:  Tremors, weakness, uncontrolled jerking movements for the last 2 weeks.  EXAM: CT HEAD WITHOUT CONTRAST  CT CERVICAL SPINE WITHOUT CONTRAST  TECHNIQUE: Multidetector  CT imaging of the head and cervical spine was performed following the standard protocol without intravenous contrast. Multiplanar CT image reconstructions of the cervical spine were also generated.  COMPARISON:  September 28, 2013  FINDINGS: CT HEAD FINDINGS  There is no midline shift, hydrocephalus, or mass. No acute hemorrhage or acute transcortical infarct is identified. There is chronic diffuse atrophy. There is no evidence of skull fracture. Small right maxillary sinus retention cyst is noted  CT CERVICAL SPINE FINDINGS  There is no acute fracture or dislocation. The prevertebral soft tissues are normal. There are degenerative joint changes with narrowed joint space and osteophyte formation from C5 through C7. Minimal anterior osteophytosis is identified C2 unchanged. Right thyroid gland enlargement compared to the left is identified unchanged. Ultrasound of thyroid on outpatient basis is as suggested on September 28, 2013. Patchy airspace opacity is identified in the visualized right upper lobe.  IMPRESSION: No focal acute intracranial abnormality identified.  Chronic diffuse atrophy.  No acute fracture or dislocation of cervical spine. Degenerative joint changes of cervical spine.  Patchy airspace opacity in the right upper lobe, further evaluation with chest x-ray is recommended.   Electronically Signed   By: Abelardo Diesel M.D.   On: 10/07/2013 14:15   Ct Cervical Spine Wo Contrast  10/07/2013   CLINICAL DATA:  Tremors, weakness, uncontrolled jerking movements for the last 2 weeks.  EXAM: CT HEAD WITHOUT CONTRAST  CT CERVICAL SPINE WITHOUT CONTRAST  TECHNIQUE: Multidetector CT imaging of the head and cervical spine was performed following the standard protocol without intravenous contrast. Multiplanar CT image reconstructions of the cervical spine were also generated.  COMPARISON:  September 28, 2013  FINDINGS: CT HEAD FINDINGS  There is no midline shift, hydrocephalus, or mass. No acute hemorrhage or acute transcortical  infarct is identified. There is chronic diffuse atrophy. There is no evidence of skull fracture. Small right maxillary sinus retention cyst is noted  CT CERVICAL SPINE FINDINGS  There is no acute fracture or dislocation. The prevertebral soft tissues are normal. There are degenerative joint changes with narrowed joint space and osteophyte formation from C5 through C7. Minimal anterior osteophytosis is identified C2 unchanged. Right thyroid gland enlargement compared to the left is identified unchanged. Ultrasound of thyroid on outpatient basis is as suggested on September 28, 2013. Patchy airspace opacity is identified in the visualized right upper lobe.  IMPRESSION: No focal acute intracranial abnormality identified.  Chronic diffuse atrophy.  No acute fracture or dislocation of cervical spine. Degenerative joint changes of  cervical spine.  Patchy airspace opacity in the right upper lobe, further evaluation with chest x-ray is recommended.   Electronically Signed   By: Abelardo Diesel M.D.   On: 10/07/2013 14:15    Scheduled Meds: . aspirin EC  81 mg Oral Daily  . atorvastatin  40 mg Oral q1800  . clopidogrel  75 mg Oral Daily  . enoxaparin (LOVENOX) injection  40 mg Subcutaneous Q24H  . furosemide  20 mg Intravenous BID  . insulin aspart  0-15 Units Subcutaneous TID WC  . insulin glargine  35 Units Subcutaneous BID  . ipratropium-albuterol  3 mL Nebulization Q4H  . letrozole  2.5 mg Oral Daily  . levothyroxine  100 mcg Oral QAC breakfast  . morphine  15 mg Oral Q12H  . pantoprazole  40 mg Oral Daily  . potassium chloride SA  20 mEq Oral Daily  . pregabalin  75 mg Oral BID  . sertraline  50 mg Oral Daily   Continuous Infusions:  Antibiotics Given (last 72 hours)   None      Active Problems:   Confusion   Encephalopathy, metabolic    Time spent: 81WEX    Cate Oravec  [facility] Pager [phone]. If 7PM-7AM, please contact night-coverage at [email or web address], password  [facility] 10/08/2013, 12:46 PM  LOS: 1 day

## 2013-10-08 NOTE — Progress Notes (Signed)
Utilization review completed.  

## 2013-10-08 NOTE — Progress Notes (Signed)
  Echocardiogram 2D Echocardiogram has been performed.  Brooke Mccullough, Brooke Mccullough 10/08/2013, 3:33 PM

## 2013-10-09 LAB — CBC
HCT: 34 % — ABNORMAL LOW (ref 36.0–46.0)
HEMOGLOBIN: 11 g/dL — AB (ref 12.0–15.0)
MCH: 26.3 pg (ref 26.0–34.0)
MCHC: 32.4 g/dL (ref 30.0–36.0)
MCV: 81.3 fL (ref 78.0–100.0)
PLATELETS: 129 10*3/uL — AB (ref 150–400)
RBC: 4.18 MIL/uL (ref 3.87–5.11)
RDW: 14.1 % (ref 11.5–15.5)
WBC: 8.1 10*3/uL (ref 4.0–10.5)

## 2013-10-09 LAB — GLUCOSE, CAPILLARY
GLUCOSE-CAPILLARY: 128 mg/dL — AB (ref 70–99)
Glucose-Capillary: 156 mg/dL — ABNORMAL HIGH (ref 70–99)
Glucose-Capillary: 176 mg/dL — ABNORMAL HIGH (ref 70–99)
Glucose-Capillary: 183 mg/dL — ABNORMAL HIGH (ref 70–99)
Glucose-Capillary: 139 mg/dL — ABNORMAL HIGH (ref 70–99)

## 2013-10-09 LAB — BASIC METABOLIC PANEL
BUN: 49 mg/dL — ABNORMAL HIGH (ref 6–23)
CALCIUM: 9.6 mg/dL (ref 8.4–10.5)
CO2: 28 mEq/L (ref 19–32)
CREATININE: 0.83 mg/dL (ref 0.50–1.10)
Chloride: 107 mEq/L (ref 96–112)
GFR calc Af Amer: 82 mL/min — ABNORMAL LOW (ref >90)
GFR calc non Af Amer: 70 mL/min — ABNORMAL LOW (ref >90)
Glucose, Bld: 135 mg/dL — ABNORMAL HIGH (ref 70–99)
Potassium: 4 mEq/L (ref 3.7–5.3)
Sodium: 148 mEq/L — ABNORMAL HIGH (ref 137–147)

## 2013-10-09 MED ORDER — ROPINIROLE HCL 1 MG PO TABS
1.0000 mg | ORAL_TABLET | Freq: Every day | ORAL | Status: DC
  Administered 2013-10-09: 1 mg via ORAL
  Filled 2013-10-09 (×2): qty 1

## 2013-10-09 MED ORDER — FLEET ENEMA 7-19 GM/118ML RE ENEM
1.0000 | ENEMA | Freq: Once | RECTAL | Status: AC
  Administered 2013-10-09: 1 via RECTAL
  Filled 2013-10-09: qty 1

## 2013-10-09 MED ORDER — FUROSEMIDE 40 MG PO TABS
40.0000 mg | ORAL_TABLET | Freq: Every day | ORAL | Status: DC
Start: 2013-10-09 — End: 2013-10-10
  Administered 2013-10-09 – 2013-10-10 (×2): 40 mg via ORAL
  Filled 2013-10-09 (×2): qty 1

## 2013-10-09 MED ORDER — IPRATROPIUM-ALBUTEROL 0.5-2.5 (3) MG/3ML IN SOLN
3.0000 mL | Freq: Two times a day (BID) | RESPIRATORY_TRACT | Status: DC
  Administered 2013-10-09: 3 mL via RESPIRATORY_TRACT
  Filled 2013-10-09 (×2): qty 3

## 2013-10-09 MED ORDER — SENNOSIDES-DOCUSATE SODIUM 8.6-50 MG PO TABS
1.0000 | ORAL_TABLET | Freq: Two times a day (BID) | ORAL | Status: DC
  Administered 2013-10-09 – 2013-10-10 (×3): 1 via ORAL
  Filled 2013-10-09 (×4): qty 1

## 2013-10-09 MED ORDER — HYDROMORPHONE HCL PF 1 MG/ML IJ SOLN
0.5000 mg | Freq: Once | INTRAMUSCULAR | Status: AC
  Administered 2013-10-09: 0.5 mg via INTRAVENOUS
  Filled 2013-10-09: qty 1

## 2013-10-09 MED ORDER — LISINOPRIL 20 MG PO TABS
20.0000 mg | ORAL_TABLET | Freq: Every day | ORAL | Status: DC
  Administered 2013-10-09 – 2013-10-10 (×2): 20 mg via ORAL
  Filled 2013-10-09 (×2): qty 1

## 2013-10-09 NOTE — Clinical Documentation Improvement (Signed)
Presents with altered mental. CHF, likely diastolic is documented.   BNP = 919  Being treated with IV Lasix 40 mg twice daily; changed to IV Lasix 20 mg twice daily  Please clarify the acuity of Diastolic CHF and document findings in next progress note and discharge summary.  Thank You, Zoila Shutter ,RN Clinical Documentation Specialist:  [place] Information Management

## 2013-10-09 NOTE — Progress Notes (Signed)
Pt c/o constipation, MD/N, stool hard, nursing will cont to monitor

## 2013-10-09 NOTE — Clinical Documentation Improvement (Signed)
CKD is documented.   White female  GFR has ranged from 31 to 69 for this admission  Please clarify the stage of CKD of the patient from the list below.  _______CKD Stage I - GFR > OR = 90 _______CKD Stage II - GFR 60-80 _______CKD Stage III - GFR 30-59 _______CKD Stage IV - GFR 15-29 _______CKD Stage V - GFR < 15 _______ESRD (End Stage Renal Disease) _______Other condition_____________  Angela Adam ,RN Clinical Documentation Specialist:  [phone]  [place] Information Management

## 2013-10-09 NOTE — Progress Notes (Signed)
Patient refused BIPAP for the night,after numerous attempts. Patient on 2lpm with Sp02 level at 97%

## 2013-10-09 NOTE — Evaluation (Signed)
Physical Therapy Evaluation Patient Details Name: Brooke Mccullough MRN: [id number] DOB: [birth year] Today's Date: 10/09/2013   History of Present Illness  Pt admitted 10/07/13 with weakness and repeated fall (adm ~1week prior for same). 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  Clinical Impression  Pt admitted with recurrent fall. Appears related to oversedation from pain meds, and pt admits this is likely the cause (for both recent falls). Pt able to identify 2 family members that she can ask to help her with administering her pain medicine. Despite recent falls, feel pt is currently moving well and fall risk can be reduced with proper use of meds. Pt currently with functional limitations due to the deficits listed below (see PT Problem List).  Pt will benefit from skilled PT to increase their independence and safety with mobility to allow discharge to the venue listed below. If patient/family cannot manage her meds and she falls again, would recommend SNF at that time.      Follow Up Recommendations Home health PT;Supervision/Assistance - 24 hour    Equipment Recommendations  None recommended by PT    Recommendations for Other Services       Precautions / Restrictions Precautions Precautions: Fall Precaution Comments: h/o falls      Mobility  Bed Mobility Overal bed mobility: Modified Independent                Transfers Overall transfer level: Needs assistance Equipment used: Rolling walker (2 wheeled) Transfers: Sit to/from Stand Sit to Stand: Min assist;+2 physical assistance;+2 safety/equipment         General transfer comment: progessed to minguard +1 throughout session  Ambulation/Gait Ambulation/Gait assistance: Min assist;Min guard;+2 safety/equipment Ambulation Distance (Feet): 40 Feet (20 x2 with seated rest) Assistive device: Rolling walker (2  wheeled) Gait Pattern/deviations: Step-through pattern;Decreased stride length;Trunk flexed Gait velocity: decreased Gait velocity interpretation: Below normal speed for age/gender General Gait Details: cues for upright posture with gt; pt limited due to deconditioning and fatigue; no LOB noted; pt reports shes at her baseline   Science writer    Modified Rankin (Stroke Patients Only)       Balance Overall balance assessment: Needs assistance Sitting-balance support: No upper extremity supported;Feet unsupported Sitting balance-Leahy Scale: Good     Standing balance support: Bilateral upper extremity supported;During functional activity Standing balance-Leahy Scale: Poor Standing balance comment: due to need for bil UE support for safety with recent h/o falls                             Pertinent Vitals/Pain Back pain, Rt posterior thigh pain (chronic); patient repositioned for comfort     Home Living Family/patient expects to be discharged to:: Private residence Living Arrangements: Spouse/significant other Available Help at Discharge: Family;Available 24 hours/day Type of Home: Apartment Home Access: Level entry     Home Layout: One level Home Equipment: Walker - 2 wheels;Grab bars - toilet;Grab bars - tub/shower;Shower seat - built in;Wheelchair - manual Additional Comments: lives at [employer] [facility] with husband (Independent Level)    Prior Function Level of Independence: Needs assistance   Gait / Transfers Assistance Needed: Ambulates with RW. has been sitting in w/c during day.  ADL's / Homemaking Assistance Needed: husband has been assisting more for past week since last admission  Hand Dominance   Dominant Hand: Right    Extremity/Trunk Assessment   Upper Extremity Assessment: Defer to OT evaluation           Lower Extremity Assessment: RLE deficits/detail;LLE deficits/detail RLE Deficits /  Details: reports h/o severe knee pain with advanced arthritis and needing surgery; knee extension 3+, hamstring 3+, DF 5/5 LLE Deficits / Details: reports h/o severe knee pain with advanced arthritis and needing surgery; knee extension 3+, hamstring 3+, DF 5/5  Cervical / Trunk Assessment: Other exceptions  Communication   Communication: No difficulties  Cognition Arousal/Alertness: Awake/alert Behavior During Therapy: WFL for tasks assessed/performed Overall Cognitive Status: Within Functional Limits for tasks assessed                      General Comments General comments (skin integrity, edema, etc.): Pt endorses that both of her recent falls are likely due to oversedation from pain medicine. She reports both falls happened as she sat up to EOB and fell forward.     Exercises        Assessment/Plan    PT Assessment Patient needs continued PT services  PT Diagnosis Difficulty walking;Generalized weakness   PT Problem List Decreased strength;Decreased activity tolerance;Decreased balance;Decreased mobility;Decreased knowledge of use of DME;Decreased safety awareness;Pain  PT Treatment Interventions DME instruction;Gait training;Functional mobility training;Therapeutic activities;Therapeutic exercise;Balance training;Neuromuscular re-education;Patient/family education   PT Goals (Current goals can be found in the Care Plan section) Acute Rehab PT Goals Patient Stated Goal: to go home PT Goal Formulation: With patient/family Time For Goal Achievement: 10/16/13 Potential to Achieve Goals: Good    Frequency Min 3X/week   Barriers to discharge Decreased caregiver support husband can only provide supervision (he uses a cane)    Co-evaluation PT/OT/SLP Co-Evaluation/Treatment: Yes Reason for Co-Treatment: For patient/therapist safety (pt with repeated falls) PT goals addressed during session: Mobility/safety with mobility;Balance;Proper use of DME         End of  Session Equipment Utilized During Treatment: Gait belt;Oxygen Activity Tolerance: Patient tolerated treatment well Patient left: in bed;with call bell/phone within reach;with family/visitor present Nurse Communication: Mobility status;Other (comment) (returned to bed for nursing procedure)         Time: 0300-9233 PT Time Calculation (min): 33 min   Charges:   PT Evaluation $Initial PT Evaluation Tier I: 1 Procedure PT Treatments $Gait Training: 8-22 mins   PT G Codes:          SASSER,LYNN 10/17/13, 4:50 PM Pager [phone]

## 2013-10-09 NOTE — Progress Notes (Signed)
[facility] PROGRESS NOTE  Brooke Mccullough IWP:[id number] DOB: [birth year] DOA: 10/07/2013 PCP: Tammi Sou, MD  Assessment/Plan: 1. Metabolic encephalopathy due to hypercarbia -multifactorial -improved -suspect based on underlying COPD, could have OSA too  -worsened by narcotics/benzos/lyrica etc  -Continue  CPAP QHS -resumed MSContin at lower dose and lyrica to prevent withdrawals  -mild CHF too, based on CXR,  -cut down lasix  -Needs Sleep study as outpatient   2. COPD  -on 3.5L home O2 QHS  -poor air movement  -duonebs scheduled for now and PRN  -per Son quit smoking 4 weeks ago   3. ACute diastolic CHF  -Diurese as tolerated, watch creatinine  -ECHO with preserved EF and grade 1 DD -now on PO lasix  4. AKI on CKD  -? Cardiorenal/ACE  -improved, resume ACE  5. DM  -increase lantus dose -SSI   6. Chronic back pain  -with some R leg numbness/weakness at baseline   DVT proph: lovenox   Ambulate, Pt/OT  Code Status: Full COde Family Communication: d/w spouse at bedside Disposition Plan: Transfer to tele, home tomorrow  HPI/Subjective: Feels weak, complains of chronic back pain  Objective: Filed Vitals:   10/09/13 1139  BP: 182/65  Pulse: 85  Temp: 97.9 F (36.6 C)  Resp: 21    Intake/Output Summary (Last 24 hours) at 10/09/13 1153 Last data filed at 10/09/13 1000  Gross per 24 hour  Intake   1080 ml  Output   3050 ml  Net  -1970 ml   Filed Weights   10/07/13 1715 10/07/13 1800  Weight: 94.3 kg (207 lb 14.3 oz) 95.9 kg (211 lb 6.7 oz)    Exam:   General:  Alert, awake, oriented to self, place  Cardiovascular: S1S2/RRR  Respiratory: CTAB  Abdomen: soft, Nt, BS present  Musculoskeletal: no edema no c/c   Data Reviewed: Basic Metabolic Panel:  Recent Labs Lab 10/07/13 1316 10/08/13 10/09/13 0500  NA 141 144 148*  K 4.5 4.0 4.0  CL 102 103 107  CO2 21 23 28   GLUCOSE 166* 132* 135*  BUN 82* 82* 49*  CREATININE  1.65* 1.48* 0.83  CALCIUM 8.6 9.1 9.6   Liver Function Tests:  Recent Labs Lab 10/07/13 1316 10/08/13  AST 14 18  ALT 16 16  ALKPHOS 120* 114  BILITOT 0.2* 0.2*  PROT 6.4 6.3  ALBUMIN 2.8* 2.8*   No results found for this basename: LIPASE, AMYLASE,  in the last 168 hours  Recent Labs Lab 10/07/13 1316  AMMONIA 57   CBC:  Recent Labs Lab 10/07/13 1316 10/08/13 10/09/13 0500  WBC 13.0* 10.7* 8.1  NEUTROABS 11.4*  --   --   HGB 11.5* 10.8* 11.0*  HCT 37.1 33.8* 34.0*  MCV 85.3 83.3 81.3  PLT 128* 132* 129*   Cardiac Enzymes:  Recent Labs Lab 10/07/13 1908 10/08/13 0001  TROPONINI <0.30 <0.30   BNP (last 3 results)  Recent Labs  09/28/13 1304 10/07/13 1908  PROBNP 356.8* 919.7*   CBG:  Recent Labs Lab 10/08/13 0749 10/08/13 1209 10/08/13 1655 10/08/13 2115 10/09/13 0802  GLUCAP 146* 259* 162* 165* 156*    Recent Results (from the past 240 hour(s))  MRSA PCR SCREENING     Status: None   Collection Time    10/07/13  6:28 PM      Result Value Ref Range Status   MRSA by PCR NEGATIVE  NEGATIVE Final   Comment:  The GeneXpert MRSA Assay (FDA     approved for NASAL specimens     only), is one component of a     comprehensive MRSA colonization     surveillance program. It is not     intended to diagnose MRSA     infection nor to guide or     monitor treatment for     MRSA infections.     Studies: Dg Chest 1 View  10/07/2013   CLINICAL DATA:  Weakness and hypoglycemia  EXAM: CHEST - 1 VIEW  COMPARISON:  PA and lateral chest of September 28, 2013  FINDINGS: There is chronic volume loss on the right. The interstitial markings are increased bilaterally. The pulmonary vascularity is engorged. The cardiac silhouette is enlarged. There is no pleural effusion. The bony thorax is unremarkable.  IMPRESSION: Low grade CHF with mild interstitial edema.   Electronically Signed   By: David  Martinique   On: 10/07/2013 14:07   Ct Head Wo Contrast  10/07/2013    CLINICAL DATA:  Tremors, weakness, uncontrolled jerking movements for the last 2 weeks.  EXAM: CT HEAD WITHOUT CONTRAST  CT CERVICAL SPINE WITHOUT CONTRAST  TECHNIQUE: Multidetector CT imaging of the head and cervical spine was performed following the standard protocol without intravenous contrast. Multiplanar CT image reconstructions of the cervical spine were also generated.  COMPARISON:  September 28, 2013  FINDINGS: CT HEAD FINDINGS  There is no midline shift, hydrocephalus, or mass. No acute hemorrhage or acute transcortical infarct is identified. There is chronic diffuse atrophy. There is no evidence of skull fracture. Small right maxillary sinus retention cyst is noted  CT CERVICAL SPINE FINDINGS  There is no acute fracture or dislocation. The prevertebral soft tissues are normal. There are degenerative joint changes with narrowed joint space and osteophyte formation from C5 through C7. Minimal anterior osteophytosis is identified C2 unchanged. Right thyroid gland enlargement compared to the left is identified unchanged. Ultrasound of thyroid on outpatient basis is as suggested on September 28, 2013. Patchy airspace opacity is identified in the visualized right upper lobe.  IMPRESSION: No focal acute intracranial abnormality identified.  Chronic diffuse atrophy.  No acute fracture or dislocation of cervical spine. Degenerative joint changes of cervical spine.  Patchy airspace opacity in the right upper lobe, further evaluation with chest x-ray is recommended.   Electronically Signed   By: Abelardo Diesel M.D.   On: 10/07/2013 14:15   Ct Cervical Spine Wo Contrast  10/07/2013   CLINICAL DATA:  Tremors, weakness, uncontrolled jerking movements for the last 2 weeks.  EXAM: CT HEAD WITHOUT CONTRAST  CT CERVICAL SPINE WITHOUT CONTRAST  TECHNIQUE: Multidetector CT imaging of the head and cervical spine was performed following the standard protocol without intravenous contrast. Multiplanar CT image reconstructions of the  cervical spine were also generated.  COMPARISON:  September 28, 2013  FINDINGS: CT HEAD FINDINGS  There is no midline shift, hydrocephalus, or mass. No acute hemorrhage or acute transcortical infarct is identified. There is chronic diffuse atrophy. There is no evidence of skull fracture. Small right maxillary sinus retention cyst is noted  CT CERVICAL SPINE FINDINGS  There is no acute fracture or dislocation. The prevertebral soft tissues are normal. There are degenerative joint changes with narrowed joint space and osteophyte formation from C5 through C7. Minimal anterior osteophytosis is identified C2 unchanged. Right thyroid gland enlargement compared to the left is identified unchanged. Ultrasound of thyroid on outpatient basis is as suggested on September 28, 2013.  Patchy airspace opacity is identified in the visualized right upper lobe.  IMPRESSION: No focal acute intracranial abnormality identified.  Chronic diffuse atrophy.  No acute fracture or dislocation of cervical spine. Degenerative joint changes of cervical spine.  Patchy airspace opacity in the right upper lobe, further evaluation with chest x-ray is recommended.   Electronically Signed   By: Abelardo Diesel M.D.   On: 10/07/2013 14:15    Scheduled Meds: . aspirin EC  81 mg Oral Daily  . atorvastatin  40 mg Oral q1800  . clopidogrel  75 mg Oral Daily  . enoxaparin (LOVENOX) injection  40 mg Subcutaneous Q24H  . furosemide  40 mg Oral Daily  . insulin aspart  0-15 Units Subcutaneous TID WC  . insulin glargine  35 Units Subcutaneous BID  . ipratropium-albuterol  3 mL Nebulization BID  . letrozole  2.5 mg Oral Daily  . levothyroxine  100 mcg Oral QAC breakfast  . lisinopril  20 mg Oral Daily  . morphine  15 mg Oral Q12H  . pantoprazole  40 mg Oral Daily  . potassium chloride SA  20 mEq Oral Daily  . pregabalin  75 mg Oral BID  . sertraline  50 mg Oral Daily   Continuous Infusions:  Antibiotics Given (last 72 hours)   None      Active  Problems:   Confusion   Encephalopathy, metabolic    Time spent: 03PRX    Damauri Minion  [facility] Pager [phone]. If 7PM-7AM, please contact night-coverage at [email or web address], password [facility] 10/09/2013, 11:53 AM  LOS: 2 days

## 2013-10-09 NOTE — Evaluation (Signed)
Occupational Therapy Evaluation Patient Details Name: Brooke Mccullough MRN: [id number] DOB: 1945/06/03 Today's Date: 10/09/2013    History of Present Illness Brooke Mccullough is a 68 y.o. female withh PMH COPD on home oxygen 3.5L at night and at daytime prn, DM type 2, IBS, OA, DDD, depression/anxiety, gout, HTN, HLD, R breast cancer, chronic back pain on long term narcotics who presents to the ER with lethargy, increased confusion. IN ER, CT head and lab work unrevealing, VBG with resp acidosis, started on BIPAP, ABG pending, slight bump in creatinine and CXR with mild CHF.      Clinical Impression   Pt admitted with above.  H/o falls and recent admission within past 2 weeks.  Pt is deconditioned at baseline and admits to increasing dosage of pain pill past recommended dose (which could be contributing to falls).  Pt will benefit from continued acute OT services to address problem list. Recommending HHOT to further maximize pt safety and independence with ALDs/IADLs.    Follow Up Recommendations  Home health OT;Supervision/Assistance - 24 hour    Equipment Recommendations  None recommended by OT    Recommendations for Other Services       Precautions / Restrictions Precautions Precautions: Fall Precaution Comments: h/o falls      Mobility Bed Mobility Overal bed mobility: Modified Independent                Transfers Overall transfer level: Needs assistance Equipment used: Rolling walker (2 wheeled) Transfers: Sit to/from Stand Sit to Stand: Min guard         General transfer comment: guarding for safety    Balance                                            ADL Overall ADL's : Needs assistance/impaired                     Lower Body Dressing: Maximal assistance;Sitting/lateral leans Lower Body Dressing Details (indicate cue type and reason): Pt refusing to attempt donning socks, stating "I cant do it" Toilet Transfer: Min  guard;Ambulation           Functional mobility during ADLs: Min guard;Rolling walker General ADL Comments: Pt reported to therapist that she manages her own medicine and often takes more pain pills that is recommended (due to incr pain in back and knee). Educated pt and spouse on having a second person assist pt with medication management.  OT provided instruction on energy conservations techniques.     Vision                     Perception     Praxis      Pertinent Vitals/Pain See vitals     Hand Dominance Right   Extremity/Trunk Assessment Upper Extremity Assessment Upper Extremity Assessment: Overall WFL for tasks assessed           Communication Communication Communication: No difficulties   Cognition Arousal/Alertness: Awake/alert Behavior During Therapy: WFL for tasks assessed/performed Overall Cognitive Status: Within Functional Limits for tasks assessed                     General Comments       Exercises       Shoulder Instructions      Home Living Family/patient expects to be discharged to:: Private residence Living  Arrangements: Spouse/significant other Available Help at Discharge: Family;Available 24 hours/day Type of Home: Apartment Home Access: Level entry     Home Layout: One level     Bathroom Shower/Tub: Occupational psychologist: Handicapped height     Home Equipment: Walker - 2 wheels;Grab bars - toilet;Grab bars - tub/shower;Shower seat - built in;Wheelchair - manual   Additional Comments: lives at [employer] with husband (Independent Level)      Prior Functioning/Environment Level of Independence: Needs assistance  Gait / Transfers Assistance Needed: Ambulates with RW. has been sitting in w/c during day. ADL's / Homemaking Assistance Needed: husband has been assisting more for past week since last admission        OT Diagnosis: Generalized weakness;Acute pain   OT Problem List: Decreased  strength;Decreased activity tolerance;Impaired balance (sitting and/or standing);Decreased knowledge of use of DME or AE   OT Treatment/Interventions: Self-care/ADL training;DME and/or AE instruction;Therapeutic activities;Balance training;Patient/family education    OT Goals(Current goals can be found in the care plan section) Acute Rehab OT Goals Patient Stated Goal: to go home OT Goal Formulation: With patient/family Time For Goal Achievement: 10/16/13 Potential to Achieve Goals: Good  OT Frequency: Min 2X/week   Barriers to D/C:            Co-evaluation              End of Session Equipment Utilized During Treatment: Rolling walker;Oxygen Nurse Communication: Mobility status  Activity Tolerance: Patient tolerated treatment well Patient left: in bed;with call bell/phone within reach;with family/visitor present;with nursing/sitter in room   Time: 1430-1503 OT Time Calculation (min): 33 min Charges:  OT General Charges $OT Visit: 1 Procedure OT Evaluation $Initial OT Evaluation Tier I: 1 Procedure OT Treatments $Self Care/Home Management : 8-22 mins G-Codes:    Darrol Jump 10/09/2013, 4:36 PM  10/09/2013 Darrol Jump OTR/L Pager [phone] Office [phone]

## 2013-10-09 NOTE — Progress Notes (Signed)
Pt txing to 3W25, per MD order, pt VSS, pt sys BP increased MD/N new orders received, family at [facility], pt and family verbalized understanding of tx, report called to receiving RN, all questions answered

## 2013-10-09 NOTE — Clinical Documentation Improvement (Signed)
Presents with confusion, altered mental status, metabolic encephalopathy.   Metabolic Encephalopathy due to Hypercarbia is documented.   Suspect underlying COPD  Please clarify if the Hypercarbia is secondary to: COPD Exacerbation          Acute Diastolic CHF Exacerbation          Acute on Chronic Respiratory Failure          All of the above          Other Condition  Thank You, Zoila Shutter ,RN Clinical Documentation Specialist:  [place] Information Management

## 2013-10-10 LAB — GLUCOSE, CAPILLARY: GLUCOSE-CAPILLARY: 135 mg/dL — AB (ref 70–99)

## 2013-10-10 MED ORDER — LISINOPRIL 10 MG PO TABS: 20.0000 mg | ORAL_TABLET | Freq: Every day | ORAL | Status: DC

## 2013-10-10 MED ORDER — PREGABALIN 75 MG PO CAPS: 75.0000 mg | ORAL_CAPSULE | Freq: Two times a day (BID) | ORAL | Status: DC

## 2013-10-10 MED ORDER — FUROSEMIDE 40 MG PO TABS: 40.0000 mg | ORAL_TABLET | Freq: Two times a day (BID) | ORAL | Status: DC

## 2013-10-10 MED ORDER — MORPHINE SULFATE ER 15 MG PO TBCR: 15.0000 mg | EXTENDED_RELEASE_TABLET | Freq: Two times a day (BID) | ORAL | Status: DC

## 2013-10-10 MED ORDER — ALBUTEROL SULFATE HFA 108 (90 BASE) MCG/ACT IN AERS: 2.0000 | INHALATION_SPRAY | Freq: Four times a day (QID) | RESPIRATORY_TRACT | Status: DC | PRN

## 2013-10-10 MED ORDER — ALPRAZOLAM 0.5 MG PO TABS: 0.5000 mg | ORAL_TABLET | Freq: Every day | ORAL | Status: DC | PRN

## 2013-10-10 MED ORDER — INSULIN ASPART 100 UNIT/ML ~~LOC~~ SOLN: 6.0000 [IU] | Freq: Three times a day (TID) | SUBCUTANEOUS | Status: DC

## 2013-10-10 MED ORDER — ROPINIROLE HCL 1 MG PO TABS: 1.0000 mg | ORAL_TABLET | Freq: Every day | ORAL | Status: DC

## 2013-10-10 MED ORDER — INSULIN GLARGINE 100 UNIT/ML ~~LOC~~ SOLN: 40.0000 [IU] | Freq: Two times a day (BID) | SUBCUTANEOUS | Status: DC

## 2013-10-11 NOTE — Care Management Note (Signed)
    Page 1 of 1   10/11/2013     8:45:07 AM CARE MANAGEMENT NOTE 10/11/2013  Patient:  Brooke Mccullough, Brooke Mccullough   Account Number:  [id number]  Date Initiated:  10/10/2013  Documentation initiated by:  [facility]  Subjective/Objective Assessment:   adm: drowsiness     Action/Plan:   discharge plan   Anticipated DC Date:  10/10/2013   Anticipated DC Plan:  [place]  CM consult      [facility] Choice  HOME HEALTH   Choice offered to / List presented to:  [name]        [place] arranged  [place].   Status of service:  Completed, signed off Medicare Important Message given?   (If response is "NO", the following Medicare IM given date fields will be blank) Date Medicare IM given:   Date Additional Medicare IM given:    Discharge Disposition:  [place]  Per UR Regulation:    If discussed at Long Length of Stay Meetings, dates discussed:    Comments:  10/11/13 08:00 CM spoke with pt on phone to offer choice for home health agency to render HHPT/OT services.  Pt chooses [facility].  Pt states she has a walker, wheelchair, tub seat and does not need any other DME.  Address and contact information verified with pt.  Referral texted to [facility] rep, [place].  No other CM needs were communicated.  Mariane Masters, BSN, CM [phone].

## 2013-10-12 ENCOUNTER — Telehealth: Payer: Self-pay | Admitting: Family Medicine

## 2013-10-12 NOTE — Telephone Encounter (Signed)
Left detailed message ( okay per DPR ) stating that there will be no rx's given at this time and patient can hold questions until her next OV 10/16/13.

## 2013-10-12 NOTE — Telephone Encounter (Signed)
Pt called requesting rf of tramadol 50MG  two tabs Q6hrs for pain.  She states she was just let out of hospital.  Also, patient wants to know if she can go back on requip two tabs QHS because one isn't helping her restless legs. Please advise.

## 2013-10-12 NOTE — Telephone Encounter (Signed)
No rx's right now.  We'll address these questions at her office f/u set for 10/16/13.-thx

## 2013-10-14 ENCOUNTER — Ambulatory Visit (INDEPENDENT_AMBULATORY_CARE_PROVIDER_SITE_OTHER): Payer: Medicare (Managed Care) | Admitting: Family Medicine

## 2013-10-14 ENCOUNTER — Encounter: Payer: Self-pay | Admitting: Family Medicine

## 2013-10-14 VITALS — BP 123/69 | HR 65 | Temp 97.7°F | Resp 18 | Ht 64.5 in | Wt 208.0 lb

## 2013-10-14 DIAGNOSIS — E010 Iodine-deficiency related diffuse (endemic) goiter: Secondary | ICD-10-CM

## 2013-10-14 DIAGNOSIS — R0989 Other specified symptoms and signs involving the circulatory and respiratory systems: Secondary | ICD-10-CM

## 2013-10-14 DIAGNOSIS — N183 Chronic kidney disease, stage 3 unspecified: Secondary | ICD-10-CM

## 2013-10-14 DIAGNOSIS — R0609 Other forms of dyspnea: Secondary | ICD-10-CM

## 2013-10-14 DIAGNOSIS — E049 Nontoxic goiter, unspecified: Secondary | ICD-10-CM

## 2013-10-14 DIAGNOSIS — G9341 Metabolic encephalopathy: Secondary | ICD-10-CM

## 2013-10-14 DIAGNOSIS — R0689 Other abnormalities of breathing: Secondary | ICD-10-CM

## 2013-10-14 DIAGNOSIS — G894 Chronic pain syndrome: Secondary | ICD-10-CM

## 2013-10-14 DIAGNOSIS — I5032 Chronic diastolic (congestive) heart failure: Secondary | ICD-10-CM

## 2013-10-14 DIAGNOSIS — I509 Heart failure, unspecified: Secondary | ICD-10-CM

## 2013-10-14 DIAGNOSIS — J441 Chronic obstructive pulmonary disease with (acute) exacerbation: Secondary | ICD-10-CM

## 2013-10-14 MED ORDER — PREGABALIN 75 MG PO CAPS: ORAL_CAPSULE | ORAL | Status: DC

## 2013-10-14 MED ORDER — PREGABALIN 75 MG PO CAPS: 75.0000 mg | ORAL_CAPSULE | Freq: Two times a day (BID) | ORAL | Status: DC

## 2013-10-14 MED ORDER — ROPINIROLE HCL 1 MG PO TABS: ORAL_TABLET | ORAL | Status: DC

## 2013-10-14 MED ORDER — TRAMADOL HCL 50 MG PO TABS: 100.0000 mg | ORAL_TABLET | Freq: Four times a day (QID) | ORAL | Status: DC | PRN

## 2013-10-14 NOTE — Progress Notes (Signed)
Pre visit review using our clinic review tool, if applicable. No additional management support is needed unless otherwise documented below in the visit note. 

## 2013-10-14 NOTE — ED Provider Notes (Signed)
Medical screening examination/treatment/procedure(s) were conducted as a shared visit with non-physician practitioner(s) and myself.  I personally evaluated the patient during the encounter.   EKG Interpretation   Date/Time:  Wednesday October 07 2013 11:44:03 EDT Ventricular Rate:  84 PR Interval:  155 QRS Duration: 88 QT Interval:  341 QTC Calculation: 403 R Axis:   11 Text Interpretation:  Sinus rhythm Probable anteroseptal infarct, recent  No significant change since last tracing Confirmed by GOLDSTON  MD, SCOTT  ([id number]) on 10/07/2013 12:33:25 PM       Patient with progressive AMS. Possibly medication related, combined with COPD. Has respiratory acidosis, but is able to be aroused and communicate. No hypoxia. No resp distress. Will place on BiPAP and admit.  Ephraim Hamburger, MD 10/14/13 [phone]

## 2013-10-15 ENCOUNTER — Encounter: Payer: Self-pay | Admitting: Family Medicine

## 2013-10-15 LAB — BASIC METABOLIC PANEL
BUN: 33 mg/dL — AB (ref 6–23)
CO2: 27 meq/L (ref 19–32)
CREATININE: 1 mg/dL (ref 0.4–1.2)
Calcium: 9 mg/dL (ref 8.4–10.5)
Chloride: 101 mEq/L (ref 96–112)
GFR: 55.8 mL/min — ABNORMAL LOW (ref >60.00)
Glucose, Bld: 145 mg/dL — ABNORMAL HIGH (ref 70–99)
Potassium: 3.8 mEq/L (ref 3.5–5.1)
Sodium: 138 mEq/L (ref 135–145)

## 2013-10-16 ENCOUNTER — Ambulatory Visit: Payer: Medicare Other | Admitting: Family Medicine

## 2013-10-19 NOTE — Progress Notes (Addendum)
OFFICE VISIT  10/19/2013   CC:  Chief Complaint  Patient presents with  . Hospitalization Follow-up  . Sleep Apnea  . Medication Refill    tramadol     HPI:    Patient is a 68 y.o. Caucasian female who presents for 2 week f/u.  Since I last saw her she was admitted to hospital 6/17 to 3/90 for metabolic encephalopathy due to hypercarbia (COPD exac + possibly undiagnosed and untreated OSA).  Some component of acute-on-chronic diastolic CHF was also contributing.  She reports feeling back to her baseline resp function--using 3.78m oxygen via [address] overnight and prn activity in daytime.  No albuterol required since d/c home.  Minimal snoring, no apneic spells witnessed by husband.  Pt says she had sleep study in remote past and it showed no OSA. Her MS contin dose was decreased to 122mbid and lyrica decreased to 7547mid b/c it was postulated that these meds partially contributed to pt's resp failure.  Pt notes a huge resurgence of LE periph neuropathy sx's since lyrica dose decrease, says she's doing ok so far on the MS contin 78m67md dosing but asks for RF of her tramadol (typical use for her is 2 tabs bid).  She has a hx of elevated transaminase levels and elevated alk phos but in the hospital these labs returned to normal. She had not had any sx's referable to the GI tract.  Past Medical History  Diagnosis Date  . COPD (chronic obstructive pulmonary disease)   . Fibromyalgia     Dx'd 1993.  This has impaired her significantly.  . IBS (irritable bowel syndrome)   . Edema   . Gallstones     asymptomatic per pt (as of 09/30/2013)  . Hernia of abdominal wall   . Hypertension   . High cholesterol   . On home oxygen therapy     "3.5L @ night and during day prn" (09/28/2013)  . History of pneumonia     "once"  . Adrenal benign tumor   . Type II diabetes mellitus     hx of good control per pt report + HbA1c <7% June 2015.  . AnMarland Kitchenmia   . GERD (gastroesophageal reflux disease)   .  History of duodenal ulcer   . Daily headache   . Migraine     "@ least monthly" (09/28/2013)  . TIA (transient ischemic attack) 2012; 2014  . Degenerative disc disease     Cervical and lumbar  . Osteoarthritis (arthritis due to wear and tear of joints)     "back; knees; elbows; left ankle" (09/28/2013)  . Chronic lower back pain     "L5-S1" (09/28/2013)  . Gout   . Anxiety   . Depression   . Breast cancer 2008    "right".  Lumpectomy + radiation+aromatase inhibitor (femara).  Regular f/u and mammos normal.  . Tremor     "upper extremities; last 6-7 months" (09/28/2013)  . Chronic renal insufficiency, stage III (moderate)     CrCl 50s    Past Surgical History  Procedure Laterality Date  . Lumbar laminectomy  1991; 1996  . Ankle fracture surgery Left 08/2011    Bimalleolar fx  . Total abdominal hysterectomy w/ bilateral salpingoophorectomy  1990  . Breast biopsy Right 2008  . Breast lumpectomy Right 2008  . Cataract extraction w/ intraocular lens implant Right 08/2013  . Cardiac catheterization  2000's X 2  . Bladder tack      Outpatient Prescriptions Prior to  Visit  Medication Sig Dispense Refill  . albuterol (PROVENTIL HFA;VENTOLIN HFA) 108 (90 BASE) MCG/ACT inhaler Inhale 2 puffs into the lungs every 6 (six) hours as needed for wheezing or shortness of breath.  1 Inhaler  2  . ALPRAZolam (XANAX) 0.5 MG tablet Take 1 tablet (0.5 mg total) by mouth daily as needed for anxiety or sleep.    0  . aspirin EC 81 MG tablet Take 81 mg by mouth daily.      Marland Kitchen atorvastatin (LIPITOR) 40 MG tablet Take 40 mg by mouth daily.      . calcium gluconate 500 MG tablet Take 2 tablets by mouth daily.      . Cholecalciferol (VITAMIN D-3 PO) Take 1 tablet by mouth daily.      . clopidogrel (PLAVIX) 75 MG tablet Take 75 mg by mouth daily.      . ferrous sulfate 325 (65 FE) MG tablet Take 325 mg by mouth daily with breakfast.      . furosemide (LASIX) 40 MG tablet Take 1 tablet (40 mg total) by mouth 2  (two) times daily.  30 tablet  0  . insulin aspart (NOVOLOG) 100 UNIT/ML injection Inject 6 Units into the skin 3 (three) times daily before meals.      . insulin glargine (LANTUS) 100 UNIT/ML injection Inject 0.4 mLs (40 Units total) into the skin 2 (two) times daily.      Marland Kitchen letrozole (FEMARA) 2.5 MG tablet Take 2.5 mg by mouth daily.      Marland Kitchen levothyroxine (SYNTHROID, LEVOTHROID) 100 MCG tablet Take 100 mcg by mouth daily before breakfast.      . lisinopril (PRINIVIL,ZESTRIL) 10 MG tablet Take 2 tablets (20 mg total) by mouth daily.      . Magnesium Oxide 500 MG (LAX) TABS Take 500 mg by mouth 2 (two) times daily.      Marland Kitchen morphine (MS CONTIN) 15 MG 12 hr tablet Take 1 tablet (15 mg total) by mouth every 12 (twelve) hours.  30 tablet  0  . Multiple Vitamin (MULTIVITAMIN WITH MINERALS) TABS tablet Take 1 tablet by mouth daily.      Marland Kitchen omeprazole (PRILOSEC) 40 MG capsule Take 40 mg by mouth daily.      . potassium chloride SA (K-DUR,KLOR-CON) 20 MEQ tablet Take 20 mEq by mouth daily.      . sertraline (ZOLOFT) 50 MG tablet Take 50 mg by mouth daily.      Marland Kitchen tiotropium (SPIRIVA) 18 MCG inhalation capsule Place 18 mcg into inhaler and inhale daily.      . pregabalin (LYRICA) 75 MG capsule Take 1 capsule (75 mg total) by mouth 2 (two) times daily.  60 capsule  0  . rOPINIRole (REQUIP) 1 MG tablet Take 1 tablet (1 mg total) by mouth at bedtime.      . traMADol (ULTRAM) 50 MG tablet Take 100 mg by mouth every 6 (six) hours as needed for moderate pain.       . meclizine (ANTIVERT) 25 MG tablet Take 25 mg by mouth 3 (three) times daily as needed for dizziness.       No facility-administered medications prior to visit.    Allergies  Allergen Reactions  . Clindamycin/Lincomycin Rash  . Fentanyl Rash    Duragesic Patch  . Indocin [Indomethacin] Rash  . Keflex [Cephalexin] Rash  . Penicillins Rash  . Vancomycin Rash    ROS As per HPI  PE: Blood pressure 123/69, pulse 65, temperature 97.7 F (  36.5  C), temperature source Temporal, resp. rate 18, height 5' 4.5" (1.638 m), weight 208 lb (94.348 kg), SpO2 96.00%. Gen: alert, NAD, sitting in wheelchair, conversant/lucid thought and speech. GGE:ZMOQ: no injection, icteris, swelling, or exudate.  EOMI, PERRLA. Mouth: lips without lesion/swelling.  Oral mucosa pink and moist. Oropharynx without erythema, exudate, or swelling.  CV: RRR, no m/r/g.   LUNGS: CTA bilat, nonlabored resps, good aeration in all lung fields. Lower legs with 1-2+ pitting edema in pretibial regions.  LABS: none today Recent labs:   Chemistry      Component Value Date/Time   NA 138 10/14/2013 1629   K 3.8 10/14/2013 1629   CL 101 10/14/2013 1629   CO2 27 10/14/2013 1629   BUN 33* 10/14/2013 1629   CREATININE 1.0 10/14/2013 1629      Component Value Date/Time   CALCIUM 9.0 10/14/2013 1629   ALKPHOS 114 10/08/2013 0000   AST 18 10/08/2013 0000   ALT 16 10/08/2013 0000   BILITOT 0.2* 10/08/2013 0000      IMPRESSION AND PLAN:  1) COPD: She has stopped smoking in the last month or so. Continue spiriva, prn albuterol, and home oxygen use.  2) Recent resp failure: question of contribution of untreated OSA to this event. Given pt's husband's lack of observed sx's to support this possible dx, will hold off on pulm referral for this at this time.  3) Chronic pain syndrome: fibromyalgia, DDD cervical and lumbar spine, osteoarthritis of knees/elbows/ankles + DPN.  Increase lyrica back up some: to 174m bid. Continue MS Contin 110mbid.  Continue tramadol 2 tabs bid prn, #120, no Rf--new rx for tramadol handed to pt today. Will refer to Dr. GaSelinda Orionor chronic pain mgmt--ordered today.  4) Thyromegaly: noted incidentally on neck CT in recent past. Will arrange thyroid u/s--ordered today.  She has been euthyroid.  5) Hx of elevated transaminase levels and elevated alkaline phosphatase: resolved in hospital recently.   Has been asymptomatic, but has known hx of  gallstones.  6) Acute on chronic renal insuff: recheck BMET today.  An After Visit Summary was printed and given to the patient.  FOLLOW UP: Return in about 1 month (around 11/13/2013) for routine chronic illness f/u (30 min).

## 2013-10-20 ENCOUNTER — Encounter: Payer: Self-pay | Admitting: Family Medicine

## 2013-10-20 ENCOUNTER — Ambulatory Visit (HOSPITAL_BASED_OUTPATIENT_CLINIC_OR_DEPARTMENT_OTHER): Payer: Medicare Other

## 2013-10-22 ENCOUNTER — Ambulatory Visit (HOSPITAL_BASED_OUTPATIENT_CLINIC_OR_DEPARTMENT_OTHER): Payer: Medicare Other

## 2013-10-27 ENCOUNTER — Ambulatory Visit (HOSPITAL_BASED_OUTPATIENT_CLINIC_OR_DEPARTMENT_OTHER): Payer: Medicare Other

## 2013-10-27 ENCOUNTER — Other Ambulatory Visit: Payer: Self-pay | Admitting: *Deleted

## 2013-10-27 MED ORDER — SERTRALINE HCL 50 MG PO TABS: 50.0000 mg | ORAL_TABLET | Freq: Every day | ORAL | Status: DC

## 2013-10-27 MED ORDER — ALPRAZOLAM 0.5 MG PO TABS: 0.5000 mg | ORAL_TABLET | Freq: Every day | ORAL | Status: DC | PRN

## 2013-10-27 MED ORDER — ATORVASTATIN CALCIUM 40 MG PO TABS: 40.0000 mg | ORAL_TABLET | Freq: Every day | ORAL | Status: DC

## 2013-10-27 NOTE — Telephone Encounter (Signed)
Patient had to change pharmacy to [facility].

## 2013-10-27 NOTE — Telephone Encounter (Signed)
Patient requesting xanax rf.  Please advise.

## 2013-10-27 NOTE — Telephone Encounter (Signed)
Ok for xanax #30, RF x 5.

## 2013-10-29 ENCOUNTER — Encounter: Payer: Self-pay | Admitting: Family Medicine

## 2013-10-29 ENCOUNTER — Ambulatory Visit (HOSPITAL_BASED_OUTPATIENT_CLINIC_OR_DEPARTMENT_OTHER): Payer: Medicare Other

## 2013-10-29 NOTE — Discharge Summary (Signed)
Physician Discharge Summary  Brooke Mccullough EYC:[id number] DOB: 01-Apr-1946 DOA: 10/07/2013  PCP: Tammi Sou, MD  Admit date: 10/07/2013 Discharge date: 10/10/2013  Time spent: 51minutes  Recommendations for Outpatient Follow-up:  1. PCP in 1 week 2. Please minimize sedating medications 3. Outpatient sleep study  Discharge Diagnoses:    Hypercarbia   Confusion   Encephalopathy, metabolic   Acute diastolic CHF   Suspected OSA   Anxiety   Chronic back pain   AKI on CKD   COPD    DM  Discharge Condition: stable  Diet recommendation: diabetic heart healthy  Filed Weights   10/07/13 1715 10/07/13 1800 10/10/13 0623  Weight: 94.3 kg (207 lb 14.3 oz) 95.9 kg (211 lb 6.7 oz) 92.3 kg (203 lb 7.8 oz)    History of present illness:  Chief Complaint: drowsiness  HPI: Brooke Mccullough is a 68 y.o. female withh PMH COPD on home oxygen 3.5L at night and at daytime prn, DM type 2, IBS, OA, DDD, depression/anxiety, gout, HTN, HLD, R breast cancer, chronic back pain on long term narcotics who presents to the ER with lethargy, increased confusion since yesterday evening.  History is obtained from the son who i called and discussed this with.  She just moved to [place] from [place] in the end of May, she is slow, sedentary and weak at baseline, walks very little using a walker, she was admitted last week by the IM teaching service for weakness felt to be due to deconditioning, per son she usually gets admitted every 2 months or so in [place] with weakness, similar complaints.  Yesterday afternoon she woke up from a nap and was very disoriented/couldnt stand or get OOB, this improved after an hour and was back to her baseline per her son who went to check on her.  -then apparently went to bed, and this am woke up very confused, drowsy, unable to recognize her spouse or son, was very weak and they subsequently brought her to the ER.  -IN ER, CT head and lab work unrevealing, VBG with resp acidosis,  started on BIPAP, ABG pending, slight bump in creatinine and CXR with mild CHF.      Hospital Course:  1. Metabolic encephalopathy due to hypercarbia  -multifactorial  -improved  -suspect based on underlying COPD, could have OSA too  -worsened by narcotics/benzos/lyrica etc  -Continue CPAP QHS  -resumed MSContin at lower dose and lyrica to prevent withdrawals  -mild CHF too, based on CXR,  -diuresed with lasix and now changed to PO  -Needs Sleep study as outpatient   2. COPD  -on 3.5L home O2 QHS  -without overt exacerbation, continued on nebs -per Son quit smoking 4 weeks ago   3. ACute diastolic CHF  -Diuresed as tolerated  -ECHO with preserved EF and grade 1 DD  -now on PO lasix   4. AKI on CKD  -? Cardiorenal/ACE  -improved, resumed ACE   5. DM  -continued on lantus   6. Chronic back pain  -with some R leg numbness/weakness at baseline  -resumed MSContin and Lyrica at lower dose     Discharge Exam: Filed Vitals:   10/10/13 0623  BP: 156/54  Pulse: 56  Temp: 98.8 F (37.1 C)  Resp: 18    General: AAOx3 Cardiovascular: S1S2/RRR Respiratory: CTAB  Discharge Instructions You were cared for by a hospitalist during your hospital stay. If you have any questions about your discharge medications or the care you received while you were in the hospital  after you are discharged, you can call the unit and asked to speak with the hospitalist on call if the hospitalist that took care of you is not available. Once you are discharged, your primary care physician will handle any further medical issues. Please note that NO REFILLS for any discharge medications will be authorized once you are discharged, as it is imperative that you return to your primary care physician (or establish a relationship with a primary care physician if you do not have one) for your aftercare needs so that they can reassess your need for medications and monitor your lab values.  Discharge  Instructions   Diet - low sodium heart healthy    Complete by:  As directed      Diet Carb Modified    Complete by:  As directed      Increase activity slowly    Complete by:  As directed             Medication List    STOP taking these medications       ALPRAZolam 0.5 MG tablet  Commonly known as:  XANAX     ibuprofen 200 MG tablet  Commonly known as:  ADVIL,MOTRIN     metoCLOPramide 10 MG tablet  Commonly known as:  REGLAN     pregabalin 75 MG capsule  Commonly known as:  LYRICA     rOPINIRole 1 MG tablet  Commonly known as:  REQUIP      TAKE these medications       albuterol 108 (90 BASE) MCG/ACT inhaler  Commonly known as:  PROVENTIL HFA;VENTOLIN HFA  Inhale 2 puffs into the lungs every 6 (six) hours as needed for wheezing or shortness of breath.     aspirin EC 81 MG tablet  Take 81 mg by mouth daily.     calcium gluconate 500 MG tablet  Take 2 tablets by mouth daily.     clopidogrel 75 MG tablet  Commonly known as:  PLAVIX  Take 75 mg by mouth daily.     ferrous sulfate 325 (65 FE) MG tablet  Take 325 mg by mouth daily with breakfast.     furosemide 40 MG tablet  Commonly known as:  LASIX  Take 1 tablet (40 mg total) by mouth 2 (two) times daily.     insulin aspart 100 UNIT/ML injection  Commonly known as:  novoLOG  Inject 6 Units into the skin 3 (three) times daily before meals.     insulin glargine 100 UNIT/ML injection  Commonly known as:  LANTUS  Inject 0.4 mLs (40 Units total) into the skin 2 (two) times daily.     letrozole 2.5 MG tablet  Commonly known as:  FEMARA  Take 2.5 mg by mouth daily.     levothyroxine 100 MCG tablet  Commonly known as:  SYNTHROID, LEVOTHROID  Take 100 mcg by mouth daily before breakfast.     lisinopril 10 MG tablet  Commonly known as:  PRINIVIL,ZESTRIL  Take 2 tablets (20 mg total) by mouth daily.     Magnesium Oxide 500 MG (LAX) Tabs  Take 500 mg by mouth 2 (two) times daily.     meclizine 25 MG tablet   Commonly known as:  ANTIVERT  Take 25 mg by mouth 3 (three) times daily as needed for dizziness.     morphine 15 MG 12 hr tablet  Commonly known as:  MS CONTIN  Take 1 tablet (15 mg total) by mouth every 12 (twelve)  hours.     multivitamin with minerals Tabs tablet  Take 1 tablet by mouth daily.     omeprazole 40 MG capsule  Commonly known as:  PRILOSEC  Take 40 mg by mouth daily.     potassium chloride SA 20 MEQ tablet  Commonly known as:  K-DUR,KLOR-CON  Take 20 mEq by mouth daily.     tiotropium 18 MCG inhalation capsule  Commonly known as:  SPIRIVA  Place 18 mcg into inhaler and inhale daily.     VITAMIN D-3 PO  Take 1 tablet by mouth daily.       Allergies  Allergen Reactions  . Clindamycin/Lincomycin Rash  . Fentanyl Rash    Duragesic Patch  . Indocin [Indomethacin] Rash  . Keflex [Cephalexin] Rash  . Penicillins Rash  . Vancomycin Rash       Follow-up Information   Follow up with MCGOWEN,PHILIP H, MD In 1 week.   Specialty:  Family Medicine   Contact information:   [address] [place] [address] [phone]       Follow up with Sleep study. (to be scheduled by PCP)        The results of significant diagnostics from this hospitalization (including imaging, microbiology, ancillary and laboratory) are listed below for reference.    Significant Diagnostic Studies: Dg Chest 1 View  10/07/2013   CLINICAL DATA:  Weakness and hypoglycemia  EXAM: CHEST - 1 VIEW  COMPARISON:  PA and lateral chest of September 28, 2013  FINDINGS: There is chronic volume loss on the right. The interstitial markings are increased bilaterally. The pulmonary vascularity is engorged. The cardiac silhouette is enlarged. There is no pleural effusion. The bony thorax is unremarkable.  IMPRESSION: Low grade CHF with mild interstitial edema.   Electronically Signed   By: David  Martinique   On: 10/07/2013 14:07   Ct Head Wo Contrast  10/07/2013   CLINICAL DATA:  Tremors, weakness,  uncontrolled jerking movements for the last 2 weeks.  EXAM: CT HEAD WITHOUT CONTRAST  CT CERVICAL SPINE WITHOUT CONTRAST  TECHNIQUE: Multidetector CT imaging of the head and cervical spine was performed following the standard protocol without intravenous contrast. Multiplanar CT image reconstructions of the cervical spine were also generated.  COMPARISON:  September 28, 2013  FINDINGS: CT HEAD FINDINGS  There is no midline shift, hydrocephalus, or mass. No acute hemorrhage or acute transcortical infarct is identified. There is chronic diffuse atrophy. There is no evidence of skull fracture. Small right maxillary sinus retention cyst is noted  CT CERVICAL SPINE FINDINGS  There is no acute fracture or dislocation. The prevertebral soft tissues are normal. There are degenerative joint changes with narrowed joint space and osteophyte formation from C5 through C7. Minimal anterior osteophytosis is identified C2 unchanged. Right thyroid gland enlargement compared to the left is identified unchanged. Ultrasound of thyroid on outpatient basis is as suggested on September 28, 2013. Patchy airspace opacity is identified in the visualized right upper lobe.  IMPRESSION: No focal acute intracranial abnormality identified.  Chronic diffuse atrophy.  No acute fracture or dislocation of cervical spine. Degenerative joint changes of cervical spine.  Patchy airspace opacity in the right upper lobe, further evaluation with chest x-ray is recommended.   Electronically Signed   By: Abelardo Diesel M.D.   On: 10/07/2013 14:15   Ct Cervical Spine Wo Contrast  10/07/2013   CLINICAL DATA:  Tremors, weakness, uncontrolled jerking movements for the last 2 weeks.  EXAM: CT HEAD  WITHOUT CONTRAST  CT CERVICAL SPINE WITHOUT CONTRAST  TECHNIQUE: Multidetector CT imaging of the head and cervical spine was performed following the standard protocol without intravenous contrast. Multiplanar CT image reconstructions of the cervical spine were also generated.   COMPARISON:  September 28, 2013  FINDINGS: CT HEAD FINDINGS  There is no midline shift, hydrocephalus, or mass. No acute hemorrhage or acute transcortical infarct is identified. There is chronic diffuse atrophy. There is no evidence of skull fracture. Small right maxillary sinus retention cyst is noted  CT CERVICAL SPINE FINDINGS  There is no acute fracture or dislocation. The prevertebral soft tissues are normal. There are degenerative joint changes with narrowed joint space and osteophyte formation from C5 through C7. Minimal anterior osteophytosis is identified C2 unchanged. Right thyroid gland enlargement compared to the left is identified unchanged. Ultrasound of thyroid on outpatient basis is as suggested on September 28, 2013. Patchy airspace opacity is identified in the visualized right upper lobe.  IMPRESSION: No focal acute intracranial abnormality identified.  Chronic diffuse atrophy.  No acute fracture or dislocation of cervical spine. Degenerative joint changes of cervical spine.  Patchy airspace opacity in the right upper lobe, further evaluation with chest x-ray is recommended.   Electronically Signed   By: Abelardo Diesel M.D.   On: 10/07/2013 14:15    Microbiology: No results found for this or any previous visit (from the past 240 hour(s)).   Labs: Basic Metabolic Panel: No results found for this basename: NA, K, CL, CO2, GLUCOSE, BUN, CREATININE, CALCIUM, MG, PHOS,  in the last 168 hours Liver Function Tests: No results found for this basename: AST, ALT, ALKPHOS, BILITOT, PROT, ALBUMIN,  in the last 168 hours No results found for this basename: LIPASE, AMYLASE,  in the last 168 hours No results found for this basename: AMMONIA,  in the last 168 hours CBC: No results found for this basename: WBC, NEUTROABS, HGB, HCT, MCV, PLT,  in the last 168 hours Cardiac Enzymes: No results found for this basename: CKTOTAL, CKMB, CKMBINDEX, TROPONINI,  in the last 168 hours BNP: BNP (last 3 results)  Recent  Labs  09/28/13 1304 10/07/13 1908  PROBNP 356.8* 919.7*   CBG: No results found for this basename: GLUCAP,  in the last 168 hours     Signed:  Alyssa Mancera  [facility] 10/29/2013, 9:47 PM

## 2013-10-30 ENCOUNTER — Telehealth: Payer: Self-pay | Admitting: Family Medicine

## 2013-10-30 MED ORDER — INSULIN PEN NEEDLE 32G X 4 MM MISC: Status: DC

## 2013-10-30 NOTE — Telephone Encounter (Signed)
Pt sent MyChart message to me today stating that she now takes 15 mg morphine in morning and 30 mg in evening (instead of 30 bid as she had been doing before).  Noted.

## 2013-10-30 NOTE — Telephone Encounter (Signed)
Pt requesting RF of MS contin.  Patient Brooke Mccullough stating that she takes 15 mg BID.  Pt states that her husband will pick it up when he has his labs done on 11/03/13.  Please advise rx rf.

## 2013-11-01 MED ORDER — MORPHINE SULFATE ER 15 MG PO TBCR: EXTENDED_RELEASE_TABLET | ORAL | Status: DC

## 2013-11-01 NOTE — Telephone Encounter (Signed)
Rx printed.  Pls ask Diane to check on the status of her pain mgmt clinic referral. Thx

## 2013-11-02 ENCOUNTER — Telehealth: Payer: Self-pay | Admitting: Family Medicine

## 2013-11-02 NOTE — Telephone Encounter (Signed)
OK.  Keep me posted.-thx

## 2013-11-02 NOTE — Telephone Encounter (Signed)
Pt's PT called stating that pt's BP was at 90/40 when she therapist arrived at pt's home.  She had patient put feet up for about 10 minutes and BP was still at 111/51.   Patient was opting out of her evening BP medication and she will recheck her BP in the am to see where it is before taking the BP medication and call us if she thinks it is too low.

## 2013-11-02 NOTE — Telephone Encounter (Signed)
Rx at front desk for patient.

## 2013-11-02 NOTE — Telephone Encounter (Signed)
Noted  

## 2013-11-02 NOTE — Telephone Encounter (Signed)
I've left a couple of messages with [facility] to check the status of the referral, no response yet

## 2013-11-03 ENCOUNTER — Encounter: Payer: Self-pay | Admitting: Family Medicine

## 2013-11-03 ENCOUNTER — Ambulatory Visit (HOSPITAL_BASED_OUTPATIENT_CLINIC_OR_DEPARTMENT_OTHER): Payer: Medicare Other

## 2013-11-03 NOTE — Telephone Encounter (Signed)
Appt [facility] 11/18/13

## 2013-11-04 ENCOUNTER — Telehealth: Payer: Self-pay | Admitting: Family Medicine

## 2013-11-04 NOTE — Telephone Encounter (Signed)
Decrease bp med (lisinopril) to ONE 10mg  tab once daily and decrease lasix to ONE 40mg  tab once daily. No eval in office needed at this time.-thx

## 2013-11-04 NOTE — Telephone Encounter (Signed)
Brooke Mccullough from [facility] called stating that pt's BP is around 150/60 in the am before taking her BP medication and in the afternoon about two hours after eating her BP is around 110/50 before taking the BP medication.  Pt's nurse was concerned with pt possibly having post brandial hypotension.  Should pt come into office for evaluation?  Please advise.

## 2013-11-05 ENCOUNTER — Other Ambulatory Visit: Payer: Self-pay | Admitting: Family Medicine

## 2013-11-05 ENCOUNTER — Ambulatory Visit (HOSPITAL_BASED_OUTPATIENT_CLINIC_OR_DEPARTMENT_OTHER): Admission: RE | Admit: 2013-11-05 | Payer: Medicare Other | Source: Ambulatory Visit

## 2013-11-05 MED ORDER — FUROSEMIDE 40 MG PO TABS: 40.0000 mg | ORAL_TABLET | Freq: Every day | ORAL | Status: DC

## 2013-11-05 NOTE — Telephone Encounter (Signed)
Pt aware of new medication instructions.  Pt will also keep record of BP for review at next OV.

## 2013-11-09 ENCOUNTER — Ambulatory Visit (HOSPITAL_BASED_OUTPATIENT_CLINIC_OR_DEPARTMENT_OTHER)
Admission: RE | Admit: 2013-11-09 | Discharge: 2013-11-09 | Disposition: A | Discharge status: Home / Self Care | Payer: Medicare Other | Source: Ambulatory Visit | Attending: Family Medicine | Admitting: Family Medicine

## 2013-11-09 DIAGNOSIS — E049 Nontoxic goiter, unspecified: Secondary | ICD-10-CM | POA: Insufficient documentation

## 2013-11-09 DIAGNOSIS — E010 Iodine-deficiency related diffuse (endemic) goiter: Secondary | ICD-10-CM

## 2013-11-10 NOTE — Progress Notes (Signed)
Quick Note:  I contacted the patient and discussed the results of her thyroid ultrasound with her. I need you to enter an order for referral to endocrinology. This should be with the [facility], Dr. Cruzita Lederer, and the diagnosis is multinodular goiter. Thx! ______

## 2013-11-11 ENCOUNTER — Other Ambulatory Visit: Payer: Self-pay | Admitting: Family Medicine

## 2013-11-11 DIAGNOSIS — E042 Nontoxic multinodular goiter: Secondary | ICD-10-CM

## 2013-11-11 MED ORDER — ROPINIROLE HCL 1 MG PO TABS: ORAL_TABLET | ORAL | Status: DC

## 2013-11-11 NOTE — Telephone Encounter (Signed)
Pt requesting ropinirole 1 mg. Please advise.

## 2013-11-12 ENCOUNTER — Encounter: Payer: Self-pay | Admitting: Family Medicine

## 2013-11-12 ENCOUNTER — Ambulatory Visit (INDEPENDENT_AMBULATORY_CARE_PROVIDER_SITE_OTHER): Payer: Medicare Other | Admitting: Family Medicine

## 2013-11-12 VITALS — BP 145/70 | HR 80 | Temp 97.6°F | Resp 18 | Ht 64.5 in | Wt 202.0 lb

## 2013-11-12 DIAGNOSIS — Z1389 Encounter for screening for other disorder: Secondary | ICD-10-CM

## 2013-11-12 DIAGNOSIS — N183 Chronic kidney disease, stage 3 unspecified: Secondary | ICD-10-CM

## 2013-11-12 DIAGNOSIS — R609 Edema, unspecified: Secondary | ICD-10-CM

## 2013-11-12 DIAGNOSIS — G894 Chronic pain syndrome: Secondary | ICD-10-CM

## 2013-11-12 DIAGNOSIS — H6980 Other specified disorders of Eustachian tube, unspecified ear: Secondary | ICD-10-CM

## 2013-11-12 DIAGNOSIS — L988 Other specified disorders of the skin and subcutaneous tissue: Secondary | ICD-10-CM

## 2013-11-12 DIAGNOSIS — R234 Changes in skin texture: Secondary | ICD-10-CM

## 2013-11-12 DIAGNOSIS — H6990 Unspecified Eustachian tube disorder, unspecified ear: Secondary | ICD-10-CM

## 2013-11-12 DIAGNOSIS — N2889 Other specified disorders of kidney and ureter: Secondary | ICD-10-CM | POA: Insufficient documentation

## 2013-11-12 MED ORDER — LISINOPRIL 10 MG PO TABS: 10.0000 mg | ORAL_TABLET | Freq: Every day | ORAL | Status: DC

## 2013-11-12 MED ORDER — CITALOPRAM HYDROBROMIDE 20 MG PO TABS: 20.0000 mg | ORAL_TABLET | Freq: Every day | ORAL | Status: DC

## 2013-11-12 NOTE — Progress Notes (Signed)
OFFICE VISIT  11/15/2013  CC:  Chief Complaint  Patient presents with  . Follow-up   HPI:    Patient is a 68 y.o. Caucasian female who presents for 1 mo f/u chronic med probs. Since last visit she has been set up to see pain mgmt MD on 11/18/13. She had a thyroid u/s that confirmed multinodular goiter, multiple >1 cm and needs bx--has been referred to Dr. Cruzita Lederer in endo for this. She has recently restarted her lasix at 40mg  bid due to LE swelling (about 1 week now).    On two occasions in the last 1-2 wks she has bled from the incision/scar where she had previous right breast lumpectomy in 2008.  The skin there is flaky and dry.  No pain.  She noted a dab of blood but no opening of the incision.  LE swelling has returned again.  She tries to adhere to low Na diet.  Has not been wearing her compression hose.  Sites hx of chronic LBP, says MRI in [place] in 2015 showed DDD L5-S1.  She currently gets PT. She says her PT told her to ask me about citalopram as help for this problem: pt says her morphine, tramadol, and lyrica don't help the pain.  Left ear feels plugged up for about 5-6 days.  Yesterday the ear hurt but today none.  She got some wax out yesterday.  She has multiple skin lesions and requests a dermatology referral.   Past Medical History  Diagnosis Date  . COPD (chronic obstructive pulmonary disease)     nocturnal oxygen, plus daytime use with activity  . Fibromyalgia     Dx'd 1993.  This has impaired her significantly.  . IBS (irritable bowel syndrome)   . Edema   . Gallstones     asymptomatic per pt (as of 09/30/2013)  . Hernia of abdominal wall   . Hypertension   . High cholesterol   . On home oxygen therapy     "3.5L @ night and during day prn" (09/28/2013)  . History of pneumonia     "once"  . Adrenal benign tumor   . Type II diabetes mellitus     hx of good control per pt report + HbA1c <7% June 2015.  Marland Kitchen Anemia   . GERD (gastroesophageal reflux disease)    . History of duodenal ulcer   . Daily headache   . Migraine     "@ least monthly" (09/28/2013)  . TIA (transient ischemic attack) 2012; 2014  . Degenerative disc disease     Cervical and lumbar  . Osteoarthritis (arthritis due to wear and tear of joints)     "back; knees; elbows; left ankle" (09/28/2013)  . Chronic lower back pain     "L5-S1" (09/28/2013)  . Gout   . Anxiety   . Depression   . Breast cancer 2008    "right".  Lumpectomy + radiation+aromatase inhibitor (femara).  Regular f/u and mammos normal.  . Tremor     "upper extremities; last 6-7 months" (09/28/2013)  . Chronic renal insufficiency, stage III (moderate)     CrCl 50s    Past Surgical History  Procedure Laterality Date  . Lumbar laminectomy  1991; 1996  . Ankle fracture surgery Left 08/2011    Bimalleolar fx  . Total abdominal hysterectomy w/ bilateral salpingoophorectomy  1990  . Breast biopsy Right 2008  . Breast lumpectomy Right 2008  . Cataract extraction w/ intraocular lens implant Right 08/2013  . Cardiac catheterization  2000's X 2  . Bladder tack      Outpatient Prescriptions Prior to Visit  Medication Sig Dispense Refill  . albuterol (PROVENTIL HFA;VENTOLIN HFA) 108 (90 BASE) MCG/ACT inhaler Inhale 2 puffs into the lungs every 6 (six) hours as needed for wheezing or shortness of breath.  1 Inhaler  2  . ALPRAZolam (XANAX) 0.5 MG tablet Take 1 tablet (0.5 mg total) by mouth daily as needed for anxiety or sleep.  30 tablet  5  . aspirin EC 81 MG tablet Take 81 mg by mouth daily.      Marland Kitchen atorvastatin (LIPITOR) 40 MG tablet Take 1 tablet (40 mg total) by mouth daily.  30 tablet  3  . calcium gluconate 500 MG tablet Take 2 tablets by mouth daily.      . Cholecalciferol (VITAMIN D-3 PO) Take 1 tablet by mouth daily.      . clopidogrel (PLAVIX) 75 MG tablet Take 75 mg by mouth daily.      . ferrous sulfate 325 (65 FE) MG tablet Take 325 mg by mouth daily with breakfast.      . furosemide (LASIX) 40 MG tablet  Take 1 tablet (40 mg total) by mouth daily.  30 tablet  3  . insulin aspart (NOVOLOG) 100 UNIT/ML injection Inject 6 Units into the skin 3 (three) times daily before meals.      . insulin glargine (LANTUS) 100 UNIT/ML injection Inject 0.4 mLs (40 Units total) into the skin 2 (two) times daily.      . Insulin Pen Needle (BD PEN NEEDLE NANO U/F) 32G X 4 MM MISC Use pen needles as directed.  100 each  3  . letrozole (FEMARA) 2.5 MG tablet Take 2.5 mg by mouth daily.      Marland Kitchen levothyroxine (SYNTHROID, LEVOTHROID) 100 MCG tablet Take 100 mcg by mouth daily before breakfast.      . Magnesium Oxide 500 MG (LAX) TABS Take 500 mg by mouth 2 (two) times daily.      . meclizine (ANTIVERT) 25 MG tablet Take 25 mg by mouth 3 (three) times daily as needed for dizziness.      Marland Kitchen morphine (MS CONTIN) 15 MG 12 hr tablet 1 tab po qAM and 2 tabs po qhs  90 tablet  0  . Multiple Vitamin (MULTIVITAMIN WITH MINERALS) TABS tablet Take 1 tablet by mouth daily.      Marland Kitchen omeprazole (PRILOSEC) 40 MG capsule Take 40 mg by mouth daily.      . potassium chloride SA (K-DUR,KLOR-CON) 20 MEQ tablet Take 20 mEq by mouth daily.      . pregabalin (LYRICA) 75 MG capsule 2 tabs po bid  120 capsule  0  . rOPINIRole (REQUIP) 1 MG tablet 2 tabs po qhs  60 tablet  11  . tiotropium (SPIRIVA) 18 MCG inhalation capsule Place 18 mcg into inhaler and inhale daily.      . traMADol (ULTRAM) 50 MG tablet Take 2 tablets (100 mg total) by mouth every 6 (six) hours as needed for moderate pain.  120 tablet  0  . lisinopril (PRINIVIL,ZESTRIL) 10 MG tablet Take 2 tablets (20 mg total) by mouth daily.      . sertraline (ZOLOFT) 50 MG tablet Take 1 tablet (50 mg total) by mouth daily.  30 tablet  3   No facility-administered medications prior to visit.    Allergies  Allergen Reactions  . Clindamycin/Lincomycin Rash  . Fentanyl Rash    Duragesic Patch  .  Indocin [Indomethacin] Rash  . Keflex [Cephalexin] Rash  . Penicillins Rash  . Vancomycin Rash     ROS As per HPI  PE: Blood pressure 145/70, pulse 80, temperature 97.6 F (36.4 C), temperature source Temporal, resp. rate 18, height 5' 4.5" (1.638 m), weight 202 lb (91.627 kg), SpO2 91.00%. Pt examined with Kathyrn Lass, CMA, as chaperone. Gen: Alert, well appearing.  Patient is oriented to person, place, time, and situation. ENT: Ears: EAC on right was clear, EAC on left had a small amount of dried blood where she had scraped it with her fingernail.  TMs with good light reflex and landmarks bilaterally, left TM retracted.  Eyes: no injection, icteris, swelling, or exudate.  EOMI, PERRLA. Nose: no drainage or turbinate edema/swelling.  No injection or focal lesion.  Mouth: lips without lesion/swelling.  Oral mucosa pink and moist.  Dentition intact and without obvious caries or gingival swelling.  Oropharynx without erythema, exudate, or swelling.  Chest: right breast with a scar above nipple, some flaky skin on/around scar.  No lump/mass palpable.  No nipple d/c. Legs: 3+ pitting edema in both lower legs, no skin breakdown or tenderness or erythema.  LABS:  None today  IMPRESSION AND PLAN:  Cracked skin At the site of right breast lumpectomy scar.  No sign of other pathology at this time. Reassured pt.  Recommended use of aquafor ointment on top of her skin cream.  Peripheral edema Continue lasix.  Check BMET. Encouraged pt to [place]. Continue low Na diet, elevate legs as she is able.  Chronic pain syndrome Fibromyalgia + chronic LBP. Her PT suggested to her a trial of citalopram as help for her LBP.  At this time we'll try it, as it seems potential benefit outweighs potential harm. D/C zoloft.  Skin condition screening Derm referral as per pt request today.  Eustachian tube dysfunction Left.  Reassured pt.   An After Visit Summary was printed and given to the patient.  FOLLOW UP: Return in about 4 weeks (around 12/10/2013) for routine chronic  illness f/u--discuss dizzy/shaking/neck.

## 2013-11-12 NOTE — Progress Notes (Signed)
Pre visit review using our clinic review tool, if applicable. No additional management support is needed unless otherwise documented below in the visit note. 

## 2013-11-13 LAB — BASIC METABOLIC PANEL
BUN: 40 mg/dL — AB (ref 6–23)
CALCIUM: 9.2 mg/dL (ref 8.4–10.5)
CO2: 29 mEq/L (ref 19–32)
CREATININE: 1 mg/dL (ref 0.4–1.2)
Chloride: 101 mEq/L (ref 96–112)
GFR: 60.45 mL/min (ref >60.00)
Glucose, Bld: 129 mg/dL — ABNORMAL HIGH (ref 70–99)
POTASSIUM: 4.5 meq/L (ref 3.5–5.1)
Sodium: 138 mEq/L (ref 135–145)

## 2013-11-15 DIAGNOSIS — H6980 Other specified disorders of Eustachian tube, unspecified ear: Secondary | ICD-10-CM | POA: Insufficient documentation

## 2013-11-15 DIAGNOSIS — R609 Edema, unspecified: Secondary | ICD-10-CM | POA: Insufficient documentation

## 2013-11-15 DIAGNOSIS — R234 Changes in skin texture: Secondary | ICD-10-CM | POA: Insufficient documentation

## 2013-11-15 DIAGNOSIS — Z1389 Encounter for screening for other disorder: Secondary | ICD-10-CM | POA: Insufficient documentation

## 2013-11-15 NOTE — Assessment & Plan Note (Signed)
Left.  Reassured pt.

## 2013-11-15 NOTE — Assessment & Plan Note (Signed)
Continue lasix.  Check BMET. Encouraged pt to [place]. Continue low Na diet, elevate legs as she is able.

## 2013-11-15 NOTE — Assessment & Plan Note (Addendum)
Fibromyalgia + chronic LBP. Her PT suggested to her a trial of citalopram as help for her LBP.  At this time we'll try it, as it seems potential benefit outweighs potential harm. D/C zoloft.

## 2013-11-15 NOTE — Assessment & Plan Note (Signed)
At the site of right breast lumpectomy scar.  No sign of other pathology at this time. Reassured pt.  Recommended use of aquafor ointment on top of her skin cream.

## 2013-11-15 NOTE — Assessment & Plan Note (Signed)
Derm referral as per pt request today.

## 2013-11-23 ENCOUNTER — Telehealth: Payer: Self-pay | Admitting: Family Medicine

## 2013-11-23 MED ORDER — PREGABALIN 75 MG PO CAPS: ORAL_CAPSULE | ORAL | Status: DC

## 2013-11-23 MED ORDER — OMEPRAZOLE 40 MG PO CPDR: 40.0000 mg | DELAYED_RELEASE_CAPSULE | Freq: Every day | ORAL | Status: DC

## 2013-11-23 NOTE — Telephone Encounter (Signed)
Refill request for lyrica.  Last Rx was 10/14/13 # 120 no refills.  Please advise.

## 2013-11-23 NOTE — Telephone Encounter (Signed)
Lyrica rx printed.

## 2013-11-23 NOTE — Telephone Encounter (Signed)
Rx faxed

## 2013-11-25 ENCOUNTER — Other Ambulatory Visit: Payer: Self-pay | Admitting: Family Medicine

## 2013-11-25 ENCOUNTER — Telehealth: Payer: Self-pay | Admitting: Family Medicine

## 2013-11-25 MED ORDER — INSULIN GLARGINE 100 UNIT/ML SOLOSTAR PEN: PEN_INJECTOR | SUBCUTANEOUS | Status: DC

## 2013-11-25 MED ORDER — INSULIN GLARGINE 100 UNIT/ML ~~LOC~~ SOLN: 40.0000 [IU] | Freq: Two times a day (BID) | SUBCUTANEOUS | Status: DC

## 2013-11-25 NOTE — Telephone Encounter (Signed)
Pt pharmacist called stating pt uses lantus solastar pens not just plain lantus.  RX reordered and sent to pharmacy correctly, Okay per Dr. Anitra Lauth.

## 2013-11-30 ENCOUNTER — Other Ambulatory Visit: Payer: Self-pay | Admitting: Family Medicine

## 2013-11-30 ENCOUNTER — Ambulatory Visit: Payer: Medicare Other | Admitting: Internal Medicine

## 2013-11-30 ENCOUNTER — Encounter: Payer: Self-pay | Admitting: Family Medicine

## 2013-11-30 MED ORDER — LETROZOLE 2.5 MG PO TABS: 2.5000 mg | ORAL_TABLET | Freq: Every day | ORAL | Status: DC

## 2013-12-01 ENCOUNTER — Other Ambulatory Visit: Payer: Self-pay | Admitting: Family Medicine

## 2013-12-01 MED ORDER — PREGABALIN 150 MG PO CAPS: 150.0000 mg | ORAL_CAPSULE | Freq: Two times a day (BID) | ORAL | Status: DC

## 2013-12-01 NOTE — Progress Notes (Signed)
Due to insurance rules, I changed her lyrica to 150mg  caps, 1 bid (instead of 75mg  caps 2 bid). Pls notify pt of change.

## 2013-12-11 ENCOUNTER — Ambulatory Visit: Payer: Medicare Other | Admitting: Family Medicine

## 2013-12-16 ENCOUNTER — Encounter: Payer: Self-pay | Admitting: Family Medicine

## 2013-12-16 DIAGNOSIS — R809 Proteinuria, unspecified: Secondary | ICD-10-CM

## 2013-12-16 DIAGNOSIS — E1129 Type 2 diabetes mellitus with other diabetic kidney complication: Secondary | ICD-10-CM | POA: Insufficient documentation

## 2013-12-18 ENCOUNTER — Ambulatory Visit: Payer: Medicare Other | Admitting: Family Medicine

## 2013-12-21 ENCOUNTER — Encounter: Payer: Self-pay | Admitting: Internal Medicine

## 2013-12-21 ENCOUNTER — Ambulatory Visit (INDEPENDENT_AMBULATORY_CARE_PROVIDER_SITE_OTHER): Payer: Medicare Other | Admitting: Internal Medicine

## 2013-12-21 ENCOUNTER — Telehealth: Payer: Self-pay | Admitting: Family Medicine

## 2013-12-21 VITALS — BP 124/62 | HR 79 | Temp 98.4°F | Resp 12 | Ht 63.0 in | Wt 206.8 lb

## 2013-12-21 DIAGNOSIS — E039 Hypothyroidism, unspecified: Secondary | ICD-10-CM

## 2013-12-21 DIAGNOSIS — E042 Nontoxic multinodular goiter: Secondary | ICD-10-CM

## 2013-12-21 HISTORY — DX: Hypothyroidism, unspecified: E03.9

## 2013-12-21 NOTE — Telephone Encounter (Signed)
Pls ask pharmacy to allow early RF, but tell patient I only want her taking one 40mg  tab once a day of lasix.-thx

## 2013-12-21 NOTE — Telephone Encounter (Signed)
Patient aware that Rx is ready but her insurance is refusing to pay per pharmacy tech.  Tech states that medication is only $11.99.  Patient okay w/ paying that much this time.

## 2013-12-21 NOTE — Patient Instructions (Signed)
Please return in 1 year. You will be called to schedule the thyroid biopsy.Thyroid Biopsy The thyroid gland is a butterfly-shaped gland situated in the front of the neck. It produces hormones which affect metabolism, growth and development, and body temperature. A thyroid biopsy is a procedure in which small samples of tissue or fluid are removed from the thyroid gland or mass and examined under a microscope. This test is done to determine the cause of thyroid problems, such as infection, cancer, or other thyroid problems. There are 2 ways to obtain samples: 1. Fine needle biopsy. Samples are removed using a thin needle inserted through the skin and into the thyroid gland or mass. 2. Open biopsy. Samples are removed after a cut (incision) is made through the skin. LET YOUR CAREGIVER KNOW ABOUT:   Allergies.  Medications taken including herbs, eye drops, over-the-counter medications, and creams.  Use of steroids (by mouth or creams).  Previous problems with anesthetics or numbing medicine.  Possibility of pregnancy, if this applies.  History of blood clots (thrombophlebitis).  History of bleeding or blood problems.  Previous surgery.  Other health problems. RISKS AND COMPLICATIONS  Bleeding from the site. The risk of bleeding is higher if you have a bleeding disorder or are taking any blood thinning medications (anticoagulants).  Infection.  Injury to structures near the thyroid gland. BEFORE THE PROCEDURE  This is a procedure that can be done as an outpatient. Confirm the time that you need to arrive for your procedure. Confirm whether there is a need to fast or withhold any medications. A blood sample may be done to determine your blood clotting time. Medicine may be given to help you relax (sedative). PROCEDURE Fine needle biopsy. You will be awake during the procedure. You may be asked to lie on your back with your head tipped backward to extend your neck. Let your caregiver  know if you cannot tolerate the positioning. An area on your neck will be cleansed. A needle is inserted through the skin of your neck. You may feel a mild discomfort during this procedure. You may be asked to avoid coughing, talking, swallowing, or making sounds during some portions of the procedure. The needle is withdrawn once tissue or fluid samples have been removed. Pressure may be applied to the neck to reduce swelling and ensure that bleeding has stopped. The samples will be sent for examination.  Open biopsy. You will be given general anesthesia. You will be asleep during the procedure. An incision is made in your neck. A sample of thyroid tissue or the mass is removed. The tissue sample or mass will be sent for examination. The sample or mass may be examined during the biopsy. If the sample or mass contains cancer cells, some or all of the thyroid gland may be removed. The incision is closed with stitches. AFTER THE PROCEDURE  Your recovery will be assessed and monitored. If there are no problems, as an outpatient, you should be able to go home shortly after the procedure. If you had a fine needle biopsy:  You may have soreness at the biopsy site for 1 to 2 days. If you had an open biopsy:   You may have soreness at the biopsy site for 3 to 4 days.  You may have a hoarse voice or sore throat for 1 to 2 days. Obtaining the Test Results It is your responsibility to obtain your test results. Do not assume everything is normal if you have not heard from your  caregiver or the medical facility. It is important for you to follow up on all of your test results. HOME CARE INSTRUCTIONS   Keeping your head raised on a pillow when you are lying down may ease biopsy site discomfort.  Supporting the back of your head and neck with both hands as you sit up from a lying position may ease biopsy site discomfort.  Only take over-the-counter or prescription medicines for pain, discomfort, or fever as  directed by your caregiver.  Throat lozenges or gargling with warm salt water may help to soothe a sore throat. SEEK IMMEDIATE MEDICAL CARE IF:   You have severe bleeding from the biopsy site.  You have difficulty swallowing.  You have a fever.  You have increased pain, swelling, redness, or warmth at the biopsy site.  You notice pus coming from the biopsy site.  You have swollen glands (lymph nodes) in your neck. Document Released: 02/04/2007 Document Revised: 08/04/2012 Document Reviewed: 07/02/2013 [facility] Patient Information 2015 [place], [place]. This information is not intended to replace advice given to you by your health care provider. Make sure you discuss any questions you have with your health care provider.

## 2013-12-21 NOTE — Progress Notes (Addendum)
Patient ID: Brooke Mccullough, female   DOB: [birth year], 68 y.o.   MRN: [id number]   HPI  Brooke Mccullough is a 68 y.o.-year-old female, referred by her PCP, Dr. Anitra Lauth, for management of MNG. She moved to [place] 3 mo ago from [place].  She saw PCP for the first time in 09/2013 >> he palpated a thyroid nodule.  Thyroid U/S (11/09/2013): few nodules, largest ones in the R lobe: Dominant isoechoic 2.5 x 1.8 x 1.8 cm solid nodule in the interpolar region. Also, a 1.3 x 1.3 x 0.9 cm lower pole hypoechoic solid nodule.   Pt denies feeling nodules in neck, no hoarseness, + dysphagia (had Es strictures >> had the Es stretched in the past, ~15 years)/no odynophagia, + SOB with lying down (emphysema and COPD - former smoker).  I reviewed pt's thyroid tests: Lab Results  Component Value Date   TSH 0.731 10/07/2013   TSH 1.100 09/29/2013    She was dx'ed with hypothyroidism in her 20's >> on Levothyroxine 100: - in am - eats >30 min later and takes the rest of the pills - with water  Pt c/o: - + fatigue - no heat intolerance/cold intolerance - no tremors - no palpitations - no anxiety/+ depression - no hyperdefecation/+ constipation - + weight gain/loss (total 50 lbs in last 16 years) - + dry skin - + hair loss  She has a URI now >> more hoarse in last 2 days.  Pt does not have a FH of thyroid ds. No FH of thyroid cancer. No h/o radiation tx to head or neck.  I reviewed her chart and she also has a history of BrCa, s/p RxTx seeds to R breast 2008. Also, CHF,COPD/emphysema, diverticulitis, IBS neuropathy, OA, vertigo, fbmylagia, gout, etc.  ROS: Constitutional: see HPIEyes: + blurry vision, no xerophthalmia ENT: + sore throat, no nodules palpated in throat, + dysphagia (see HPI)/no odynophagia, + hoarseness - in last 2 days, + tinnitus Cardiovascular: no CP/+ SOB/no palpitations/+ leg swelling Respiratory: no cough/+ SOB Gastrointestinal: +N/+ heartburn/ no V/D/+ C Musculoskeletal: +  both: muscle/joint aches Skin: no rashes Neurological: no tremors/numbness/tingling/dizziness, + HA Psychiatric: + depression/no anxiety  Past Medical History  Diagnosis Date  . COPD (chronic obstructive pulmonary disease)     nocturnal oxygen, plus daytime use with activity  . Fibromyalgia     Dx'd 1993.  This has impaired her significantly.  . IBS (irritable bowel syndrome)   . Edema   . Gallstones     asymptomatic per pt (as of 09/30/2013); mild chronic elevation of Alk phos (remainder of hepatic panel wnl).  . Hernia of abdominal wall   . Hypertension   . Hyperlipidemia   . On home oxygen therapy     "3.5L @ night and during day prn" (09/28/2013)  . History of pneumonia     "once"  . Adrenal benign tumor   . Type II diabetes mellitus     hx of good control per pt report + HbA1c <7% June 2015.  Marland Kitchen Anemia   . GERD (gastroesophageal reflux disease)   . History of duodenal ulcer   . Daily headache   . Migraine     "@ least monthly" (09/28/2013)  . TIA (transient ischemic attack) 2012; 2014    left facial and left arm numbness  . Degenerative disc disease     Cervical and lumbar  . Osteoarthritis (arthritis due to wear and tear of joints)     "back; knees; elbows; left ankle" (09/28/2013)  .  Chronic lower back pain     "L5-S1" (09/28/2013)  . Gout   . Anxiety   . Depression   . Breast cancer 2008    "right".  Lumpectomy + radiation+aromatase inhibitor (femara).  Regular f/u and mammos normal.  . Tremor     "upper extremities; last 6-7 months" (09/28/2013)   Past Surgical History  Procedure Laterality Date  . Lumbar laminectomy  1991; 1996  . Ankle fracture surgery Left 08/2011    Bimalleolar fx  . Total abdominal hysterectomy w/ bilateral salpingoophorectomy  1990  . Breast biopsy Right 2008  . Breast lumpectomy Right 2008  . Cataract extraction w/ intraocular lens implant Right 08/2013  . Cardiac catheterization  2000's X 2  . Bladder tack     History   Social History  Main Topics  . Smoking status: Former Smoker -- 1.00 packs/day for 36 years    Types: Cigarettes    Quit date: 09/09/2013  . Smokeless tobacco: Never Used  . Alcohol Use: No  . Drug Use: No   Social History Narrative   Married, 2 children (one son and one daughter).   Relocated from [place] 08/2013 to be closer to children.   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   Educ: HS and 2 yrs business college.   Tobacco: 36 pack-yr hx, quit 2015.   Alcohol: none.  No drugs.   Ambulates with walker.   Current Outpatient Prescriptions on File Prior to Visit  Medication Sig Dispense Refill  . albuterol (PROVENTIL HFA;VENTOLIN HFA) 108 (90 BASE) MCG/ACT inhaler Inhale 2 puffs into the lungs every 6 (six) hours as needed for wheezing or shortness of breath.  1 Inhaler  2  . ALPRAZolam (XANAX) 0.5 MG tablet Take 1 tablet (0.5 mg total) by mouth daily as needed for anxiety or sleep.  30 tablet  5  . aspirin EC 81 MG tablet Take 81 mg by mouth daily.      Marland Kitchen atorvastatin (LIPITOR) 40 MG tablet Take 1 tablet (40 mg total) by mouth daily.  30 tablet  3  . calcium gluconate 500 MG tablet Take 2 tablets by mouth daily.      . Cholecalciferol (VITAMIN D-3 PO) Take 1 tablet by mouth daily.      . citalopram (CELEXA) 20 MG tablet Take 1 tablet (20 mg total) by mouth daily.  30 tablet  1  . clopidogrel (PLAVIX) 75 MG tablet Take 75 mg by mouth daily.      . ferrous sulfate 325 (65 FE) MG tablet Take 325 mg by mouth daily with breakfast.      . furosemide (LASIX) 40 MG tablet Take 1 tablet (40 mg total) by mouth daily.  30 tablet  3  . insulin aspart (NOVOLOG) 100 UNIT/ML injection Inject 6 Units into the skin 3 (three) times daily before meals.      . Insulin Glargine (LANTUS SOLOSTAR) 100 UNIT/ML Solostar Pen 40 units BID  75 mL  1  . Insulin Pen Needle (BD PEN NEEDLE NANO U/F) 32G X 4 MM MISC Use pen needles as directed.  100 each  3  . letrozole (FEMARA) 2.5 MG tablet Take 1 tablet  (2.5 mg total) by mouth daily.  90 tablet  1  . levothyroxine (SYNTHROID, LEVOTHROID) 100 MCG tablet Take 100 mcg by mouth daily before breakfast.      . lisinopril (PRINIVIL,ZESTRIL) 10 MG tablet Take 1 tablet (10 mg total) by mouth daily.  30 tablet  6  . Magnesium Oxide 500 MG (LAX) TABS Take 500 mg by mouth 2 (two) times daily.      . meclizine (ANTIVERT) 25 MG tablet Take 25 mg by mouth 3 (three) times daily as needed for dizziness.      Marland Kitchen morphine (MS CONTIN) 15 MG 12 hr tablet 1 tab po qAM and 2 tabs po qhs  90 tablet  0  . Multiple Vitamin (MULTIVITAMIN WITH MINERALS) TABS tablet Take 1 tablet by mouth daily.      Marland Kitchen omeprazole (PRILOSEC) 40 MG capsule Take 1 capsule (40 mg total) by mouth daily.  90 capsule  1  . potassium chloride SA (K-DUR,KLOR-CON) 20 MEQ tablet Take 20 mEq by mouth daily.      . pregabalin (LYRICA) 150 MG capsule Take 1 capsule (150 mg total) by mouth 2 (two) times daily.  60 capsule  5  . rOPINIRole (REQUIP) 1 MG tablet 2 tabs po qhs  60 tablet  11  . tiotropium (SPIRIVA) 18 MCG inhalation capsule Place 18 mcg into inhaler and inhale daily.      . traMADol (ULTRAM) 50 MG tablet Take 2 tablets (100 mg total) by mouth every 6 (six) hours as needed for moderate pain.  120 tablet  0   No current facility-administered medications on file prior to visit.   Allergies  Allergen Reactions  . Ciprofloxacin Other (See Comments)    Unspecified--noted in old PCP's records.  . Latex Other (See Comments)    Unspecified: noted in old PCP's records.  . Clindamycin/Lincomycin Rash  . Fentanyl Rash    Duragesic Patch  . Indocin [Indomethacin] Rash  . Keflex [Cephalexin] Rash  . Penicillins Rash  . Vancomycin Rash   Family History  Problem Relation Age of Onset  . Arthritis Mother   . Breast cancer Mother   . Hyperlipidemia Mother   . Stroke Mother   . Hypertension Mother   . Mental illness Mother   . Arthritis Father   . Cancer Father   . Hyperlipidemia Father   .  Heart disease Father   . Heart disease Maternal Grandmother   . Hyperlipidemia Maternal Grandmother   . Heart disease Paternal Grandmother   . Hypertension Paternal Grandmother   . Diabetes Paternal Grandmother   . Arthritis Paternal Grandfather    PE: BP 124/62  Pulse 79  Temp(Src) 98.4 F (36.9 C) (Oral)  Resp 12  Ht 5' 3"  (1.6 m)  Wt 206 lb 12.8 oz (93.804 kg)  BMI 36.64 kg/m2  SpO2 94% Wt Readings from Last 3 Encounters:  12/21/13 206 lb 12.8 oz (93.804 kg)  11/12/13 202 lb (91.627 kg)  10/14/13 208 lb (94.348 kg)   Constitutional: overweight, in NAD, [place] with walker Eyes: PERRLA, EOMI, no exophthalmos ENT: moist mucous membranes, + full thyroid, but no thyromegaly, no thyroid nodules palpated, no cervical lymphadenopathy Cardiovascular: RRR, No MRG, +3 pitting edema bilaterally Respiratory: CTA B Gastrointestinal: abdomen soft, NT, ND, BS+ Musculoskeletal: no deformities, strength intact in all 4;  Skin: moist, warm, no rashes Neurological: + tremor with outstretched hands, DTR normal in all 4  ASSESSMENT: 1. MNG - thyroid U/S (11/09/2013):   Right thyroid lobe Measurements: 5.1 x 3.0 x 2.2 cm. Dominant isoechoic 2.5 x 1.8 x 1.8 cm solid nodule in the interpolar region. 1.3 x 1.3 x 0.9 cm lower pole hypoechoic solid nodule.   Left thyroid lobe Measurements: 4.4 x 1.7 x 1.4 cm. 6 mm upper pole hypoechoic nodule. 5 mm  interpolar region hypoechoic nodule.   Isthmus Thickness: 4 mm. No nodules visualized.   Lymphadenopathy: None visualized.  2. Hypothyroidism  PLAN: 1. MNG  - I reviewed the images of her thyroid ultrasound along with the patient. I pointed out that the dominant nodule is large, this being a risk factor for cancer. Otherwise, this nodule is hyper/isoechoic, without calcifications, these being good prognosis factors. The color doppler was not checked . The rest of the nodules are smaller and they do not meet the criteria for bx. The R sided 1.3 cm  nodule can be followed for now. Pt does not have a thyroid cancer family history or a personal history of RxTx to head/neck. All these would favor benignity. - the only way that we can tell exactly if it is cancer or not is by doing a thyroid biopsy (FNA). I explained what the test entails. - We discussed about other options, to wait for another 6 months to a year and see if the nodule grows, and only intervene at that time. Patient decided to have the FNA done now >> I ordered this.  - I explained that this is not cancer, we can continue to follow her on a yearly basis, and check another ultrasound in another year or 2. - she should let me know if she develops increased neck compression symptoms, in that case, we might need a Ba swallow, then either lobectomy or thyroidectomy - I'll see her back in a year, assuming her FNA is normal. If FNA abnormal, we will meet sooner.  I will addend the results when they become available.  2. Hypothyroidism - well controlled - On LT4 100 mcg daily - reviewed last TSH with the pt >> normal. No need to repeat.   Adequacy Reason Satisfactory For Evaluation. Diagnosis THYROID, FINE NEEDLE ASPIRATION RIGHT (SPECIMEN 1 OF 1 COLLECTED 65/09/8125) FOLLICULAR LESION OF UNDETERMINED SIGNIFICANCE (BETHESDA CATEGORY III). SEE COMMENT. COMMENT: THE SPECIMEN IS HYPERCELLULAR. IT CONSISTS PREDOMINANTLY OF BANAL APPEARING FOLLICULAR EPITHELIAL CELLS. HOWEVER, THERE ARE A FEW GROUPS THAT CONTAIN CELLS WITH MILD CYTOLOGIC ATYPIA, INCLUDING NUCLEAR IRREGULARITIES AND SOME INTRANUCLEAR GROOVES. SOME OF THE GROUPS ARE ARRANGED AS MICROFOLLICLES. BASED ON THESE FEATURES, A FOLLICULAR LESION/NEOPLASM CAN NOT BE RULED OUT. Enid Cutter MD Pathologist, Electronic Signature (Case signed 01/28/2014) Specimen Clinical Information Thyroid enlargement, Dominant solid nodule 2.5 x 1.8 x 1.8cm lt interpolar region Source Thyroid, Fine Needle Aspiration, Right, (Specimen 1 of 1,  collected on 01/27/2014)  FLUS >> will recommend repeat Bx in 6-12 mo.

## 2013-12-21 NOTE — Telephone Encounter (Signed)
Patient states that her pharmacy won't refill her lasix because "it's too early".   Patient said she takes 1 tab every other day alternating w/ 2 tabs.  She said she has actually been taking 2 tabs daily due to swelling. Please advise how many tabs she should be taking.

## 2013-12-22 ENCOUNTER — Telehealth: Payer: Self-pay | Admitting: Family Medicine

## 2013-12-22 MED ORDER — TRAMADOL HCL 50 MG PO TABS: 100.0000 mg | ORAL_TABLET | Freq: Four times a day (QID) | ORAL | Status: DC | PRN

## 2013-12-22 MED ORDER — MORPHINE SULFATE ER 15 MG PO TBCR: EXTENDED_RELEASE_TABLET | ORAL | Status: DC

## 2013-12-22 NOTE — Telephone Encounter (Signed)
Morphine and tramadol rx's printed as per pt request.

## 2013-12-22 NOTE — Telephone Encounter (Signed)
Patient requesting rf of morphine 15 mg and tramadol 50mg .  Last morphine Rx printed 11/01/13.  Last tramadol Rx printed 10/14/13 no refills.  Please advise.   Patient's husband will p/u morphine Rx tomorrow and she would like tramadol Rx faxed to [facility].

## 2013-12-23 NOTE — Telephone Encounter (Signed)
Rx's both at front desk for p/u.

## 2014-01-04 ENCOUNTER — Ambulatory Visit: Payer: Medicare Other | Admitting: Family Medicine

## 2014-01-05 ENCOUNTER — Other Ambulatory Visit: Payer: Self-pay | Admitting: Family Medicine

## 2014-01-06 ENCOUNTER — Inpatient Hospital Stay: Admission: RE | Admit: 2014-01-06 | Payer: Medicare Other | Source: Ambulatory Visit

## 2014-01-18 ENCOUNTER — Encounter: Payer: Self-pay | Admitting: Family Medicine

## 2014-01-18 ENCOUNTER — Ambulatory Visit (INDEPENDENT_AMBULATORY_CARE_PROVIDER_SITE_OTHER): Payer: Medicare Other | Admitting: Family Medicine

## 2014-01-18 VITALS — BP 138/63 | HR 83 | Temp 98.4°F | Ht 64.5 in | Wt 187.0 lb

## 2014-01-18 DIAGNOSIS — E042 Nontoxic multinodular goiter: Secondary | ICD-10-CM

## 2014-01-18 DIAGNOSIS — I10 Essential (primary) hypertension: Secondary | ICD-10-CM

## 2014-01-18 DIAGNOSIS — R609 Edema, unspecified: Secondary | ICD-10-CM

## 2014-01-18 DIAGNOSIS — E1129 Type 2 diabetes mellitus with other diabetic kidney complication: Secondary | ICD-10-CM

## 2014-01-18 DIAGNOSIS — N058 Unspecified nephritic syndrome with other morphologic changes: Secondary | ICD-10-CM

## 2014-01-18 DIAGNOSIS — E1121 Type 2 diabetes mellitus with diabetic nephropathy: Secondary | ICD-10-CM

## 2014-01-18 DIAGNOSIS — J441 Chronic obstructive pulmonary disease with (acute) exacerbation: Secondary | ICD-10-CM

## 2014-01-18 DIAGNOSIS — Z0000 Encounter for general adult medical examination without abnormal findings: Secondary | ICD-10-CM

## 2014-01-18 DIAGNOSIS — G894 Chronic pain syndrome: Secondary | ICD-10-CM

## 2014-01-18 DIAGNOSIS — Z23 Encounter for immunization: Secondary | ICD-10-CM

## 2014-01-18 DIAGNOSIS — R0602 Shortness of breath: Secondary | ICD-10-CM

## 2014-01-18 MED ORDER — IPRATROPIUM BROMIDE 0.02 % IN SOLN
0.5000 mg | Freq: Once | RESPIRATORY_TRACT | Status: AC
  Administered 2014-01-18: 0.5 mg via RESPIRATORY_TRACT

## 2014-01-18 MED ORDER — ALBUTEROL SULFATE (2.5 MG/3ML) 0.083% IN NEBU: 2.5000 mg | INHALATION_SOLUTION | RESPIRATORY_TRACT | Status: DC | PRN

## 2014-01-18 MED ORDER — ALBUTEROL SULFATE (2.5 MG/3ML) 0.083% IN NEBU
2.5000 mg | INHALATION_SOLUTION | Freq: Once | RESPIRATORY_TRACT | Status: AC
  Administered 2014-01-18: 2.5 mg via RESPIRATORY_TRACT

## 2014-01-18 MED ORDER — PREDNISONE 20 MG PO TABS: ORAL_TABLET | ORAL | Status: DC

## 2014-01-18 NOTE — Progress Notes (Signed)
Pre visit review using our clinic review tool, if applicable. No additional management support is needed unless otherwise documented below in the visit note. 

## 2014-01-18 NOTE — Progress Notes (Signed)
OFFICE VISIT  01/19/2014   CC:  Chief Complaint  Patient presents with  . Follow-up   HPI:    Patient is a 68 y.o. Caucasian female who presents for 2 mo f/u chronic medical issues/polypharmacy. Since last visit with me she was supposed to have started seeing a pain mgmt MD but had to put this off until 2 days from now (Dr. [name]).   Has appt for thyroid FNA 01/27/14.  For the last week has been more SOB than her normal, onset was gradual, has to use oxygen to make her feel better (24/7).  Says that at one point in time her COPD required oxygen 24/7 at baseline (about a year ago).  No recent cough, no fevers. Has not been using any albuterol but admits this is what she usually feels like.  Past Medical History  Diagnosis Date  . COPD (chronic obstructive pulmonary disease)     nocturnal oxygen, plus daytime use with activity  . Fibromyalgia     Dx'd 1993.  This has impaired her significantly.  . IBS (irritable bowel syndrome)   . Edema   . Gallstones     asymptomatic per pt (as of 09/30/2013); mild chronic elevation of Alk phos (remainder of hepatic panel wnl).  . Hernia of abdominal wall   . Hypertension   . Hyperlipidemia   . On home oxygen therapy     "3.5L @ night and during day prn" (09/28/2013)  . History of pneumonia     "once"  . Adrenal benign tumor   . Type II diabetes mellitus     hx of good control per pt report + HbA1c <7% June 2015.  Marland Kitchen Anemia   . GERD (gastroesophageal reflux disease)   . History of duodenal ulcer   . Daily headache   . Migraine     "@ least monthly" (09/28/2013)  . TIA (transient ischemic attack) 2012; 2014    left facial and left arm numbness  . Degenerative disc disease     Cervical and lumbar  . Osteoarthritis (arthritis due to wear and tear of joints)     "back; knees; elbows; left ankle" (09/28/2013)  . Chronic lower back pain     "L5-S1" (09/28/2013)  . Gout   . Anxiety   . Depression   . Breast cancer 2008    "right".  Lumpectomy +  radiation+aromatase inhibitor (femara).  Regular f/u and mammos normal.  . Tremor     "upper extremities; last 6-7 months" (09/28/2013)  . Unspecified hypothyroidism 12/21/2013  . Multinodular goiter summer 2015    Plan per endo is bx (as of 12/21/13)    Past Surgical History  Procedure Laterality Date  . Lumbar laminectomy  1991; 1996  . Ankle fracture surgery Left 08/2011    Bimalleolar fx  . Total abdominal hysterectomy w/ bilateral salpingoophorectomy  1990  . Breast biopsy Right 2008  . Breast lumpectomy Right 2008  . Cataract extraction w/ intraocular lens implant Right 08/2013  . Cardiac catheterization  2000's X 2  . Bladder tack      Outpatient Prescriptions Prior to Visit  Medication Sig Dispense Refill  . albuterol (PROVENTIL HFA;VENTOLIN HFA) 108 (90 BASE) MCG/ACT inhaler Inhale 2 puffs into the lungs every 6 (six) hours as needed for wheezing or shortness of breath.  1 Inhaler  2  . ALPRAZolam (XANAX) 0.5 MG tablet Take 1 tablet (0.5 mg total) by mouth daily as needed for anxiety or sleep.  30 tablet  5  . aspirin EC 81 MG tablet Take 81 mg by mouth daily.      Marland Kitchen atorvastatin (LIPITOR) 40 MG tablet Take 1 tablet (40 mg total) by mouth daily.  30 tablet  3  . calcium gluconate 500 MG tablet Take 2 tablets by mouth daily.      . Cholecalciferol (VITAMIN D-3 PO) Take 1 tablet by mouth daily.      . citalopram (CELEXA) 20 MG tablet TAKE 1 TABLET BY MOUTH EVERY DAY  30 tablet  0  . ferrous sulfate 325 (65 FE) MG tablet Take 325 mg by mouth daily with breakfast.      . furosemide (LASIX) 40 MG tablet Take 1 tablet (40 mg total) by mouth daily.  30 tablet  3  . insulin aspart (NOVOLOG) 100 UNIT/ML injection Inject 6 Units into the skin 3 (three) times daily before meals.      . Insulin Glargine (LANTUS SOLOSTAR) 100 UNIT/ML Solostar Pen 40 units BID  75 mL  1  . letrozole (FEMARA) 2.5 MG tablet Take 1 tablet (2.5 mg total) by mouth daily.  90 tablet  1  . levothyroxine (SYNTHROID,  LEVOTHROID) 100 MCG tablet Take 100 mcg by mouth daily before breakfast.      . lisinopril (PRINIVIL,ZESTRIL) 10 MG tablet Take 1 tablet (10 mg total) by mouth daily.  30 tablet  6  . Magnesium Oxide 500 MG (LAX) TABS Take 500 mg by mouth 2 (two) times daily.      . meclizine (ANTIVERT) 25 MG tablet Take 25 mg by mouth 3 (three) times daily as needed for dizziness.      Marland Kitchen morphine (MS CONTIN) 15 MG 12 hr tablet 1 tab po qAM and 2 tabs po qhs  90 tablet  0  . Multiple Vitamin (MULTIVITAMIN WITH MINERALS) TABS tablet Take 1 tablet by mouth daily.      Marland Kitchen omeprazole (PRILOSEC) 40 MG capsule Take 1 capsule (40 mg total) by mouth daily.  90 capsule  1  . potassium chloride SA (K-DUR,KLOR-CON) 20 MEQ tablet Take 20 mEq by mouth daily.      . pregabalin (LYRICA) 150 MG capsule Take 1 capsule (150 mg total) by mouth 2 (two) times daily.  60 capsule  5  . rOPINIRole (REQUIP) 1 MG tablet 2 tabs po qhs  60 tablet  11  . sertraline (ZOLOFT) 50 MG tablet Take 50 mg by mouth daily.       Marland Kitchen tiotropium (SPIRIVA) 18 MCG inhalation capsule Place 18 mcg into inhaler and inhale daily.      . traMADol (ULTRAM) 50 MG tablet Take 2 tablets (100 mg total) by mouth every 6 (six) hours as needed for moderate pain.  120 tablet  0  . clopidogrel (PLAVIX) 75 MG tablet Take 75 mg by mouth daily.      . Potassium Chloride ER 20 MEQ TBCR TAKE 1 TABLET BY MOUTH EVERY DAY  30 tablet  0  . Insulin Pen Needle (BD PEN NEEDLE NANO U/F) 32G X 4 MM MISC Use pen needles as directed.  100 each  3   No facility-administered medications prior to visit.    Allergies  Allergen Reactions  . Ciprofloxacin Other (See Comments)    Unspecified--noted in old PCP's records.  . Latex Other (See Comments)    Unspecified: noted in old PCP's records.  . Clindamycin/Lincomycin Rash  . Fentanyl Rash    Duragesic Patch  . Indocin [Indomethacin] Rash  . Keflex [  Cephalexin] Rash  . Penicillins Rash  . Vancomycin Rash    ROS As per  HPI  PE: Blood pressure 138/63, pulse 83, temperature 98.4 F (36.9 C), temperature source Oral, height 5' 4.5" (1.638 m), weight 187 lb (84.823 kg), SpO2 95.00%. on 3.5 L oxygen.  On 2L oxygen her sat was 93-94% but pt says she felt less comfortable breathing on 2L. Gen: Alert, nontoxic appearing.  Patient is oriented to person, place, time, and situation. [id number]: no injection, icteris, swelling, or exudate.  EOMI, PERRLA. Mouth: lips without lesion/swelling.  Oral mucosa pink and moist. Oropharynx without erythema, exudate, or swelling.  CV: RRR, distant S1 and S2 as per her usual, no audible m/r/g LUNGS: distant BS, esp on expiratory phase, mildly prolonged expiratory phase, nonlabored resps.  Chest expansion symmetric, aeration symmetric.  No crackles or wheezes or rhonchi.  She has mild purse-lipped breathing, RR about 30-32.   EXT: no clubbing or cyanosis.  3+ pitting edema in both LE's bilat, no skin breakdown.  LABS:  None today  IMPRESSION AND PLAN:  COPD exacerbation 20% subjective improvement with albut/atrov neb in office today, with improved aeration on exam as well. Prednisone 45m qd x 5d, then 249mqd x 5d. Albuterol neb solution rx'd for home use.  She has albut HFA already.  Continue oxygen support at current setting of 3.5 L/m but I told her to turn this down if she began feeling better. Check BMET and CBC today.  Multinodular goiter (nontoxic) Scheduled for FNA with endocrinologist very soon. Last TSH 10/07/13 wnl.  Chronic pain syndrome Fibromyalgia, osteoarthritis of back, neck, knees. Continue current pain meds. She has initial consult appt with Dr. GaSelinda OrionPain Mgmt MD, very soon.  Diabetes Lab Results  Component Value Date   HGBA1C 6.8* 09/29/2013   Continue current insulin regimen. Discussed possible increase in glucoses from systemic steroids for COPD exacerbation, may need to up-titrate insulins accordingly.  Peripheral edema Pretty stable. Has  CRI, must avoid being overly aggressive with lasix. Low Na emphasized, elevate legs.  HTN (hypertension), benign The current medical regimen is effective;  continue present plan and medications. Lytes/cr today.  Preventative health care Flu vaccine IM and prevnar 13 IM today.   An After Visit Summary was printed and given to the patient.  FOLLOW UP: Return in about 4 days (around 01/22/2014) for f/u copd flare.

## 2014-01-19 ENCOUNTER — Other Ambulatory Visit: Payer: Self-pay | Admitting: Family Medicine

## 2014-01-19 DIAGNOSIS — J441 Chronic obstructive pulmonary disease with (acute) exacerbation: Secondary | ICD-10-CM | POA: Insufficient documentation

## 2014-01-19 DIAGNOSIS — Z0000 Encounter for general adult medical examination without abnormal findings: Secondary | ICD-10-CM | POA: Insufficient documentation

## 2014-01-19 DIAGNOSIS — I10 Essential (primary) hypertension: Secondary | ICD-10-CM | POA: Insufficient documentation

## 2014-01-19 LAB — BASIC METABOLIC PANEL
BUN: 50 mg/dL — AB (ref 6–23)
CHLORIDE: 98 meq/L (ref 96–112)
CO2: 34 meq/L — AB (ref 19–32)
Calcium: 9.6 mg/dL (ref 8.4–10.5)
Creatinine, Ser: 1 mg/dL (ref 0.4–1.2)
GFR: 58.33 mL/min — ABNORMAL LOW (ref >60.00)
GLUCOSE: 89 mg/dL (ref 70–99)
Potassium: 4.7 mEq/L (ref 3.5–5.1)
SODIUM: 137 meq/L (ref 135–145)

## 2014-01-19 LAB — CBC WITH DIFFERENTIAL/PLATELET
BASOS PCT: 0.4 % (ref 0.0–3.0)
Basophils Absolute: 0 10*3/uL (ref 0.0–0.1)
EOS PCT: 2 % (ref 0.0–5.0)
Eosinophils Absolute: 0.1 10*3/uL (ref 0.0–0.7)
HCT: 34.4 % — ABNORMAL LOW (ref 36.0–46.0)
Hemoglobin: 11.2 g/dL — ABNORMAL LOW (ref 12.0–15.0)
LYMPHS PCT: 39.5 % (ref 12.0–46.0)
Lymphs Abs: 2 10*3/uL (ref 0.7–4.0)
MCHC: 32.6 g/dL (ref 30.0–36.0)
MCV: 82.7 fl (ref 78.0–100.0)
MONO ABS: 0.3 10*3/uL (ref 0.1–1.0)
MONOS PCT: 5.7 % (ref 3.0–12.0)
NEUTROS PCT: 52.4 % (ref 43.0–77.0)
Neutro Abs: 2.7 10*3/uL (ref 1.4–7.7)
Platelets: 114 10*3/uL — ABNORMAL LOW (ref 150.0–400.0)
RBC: 4.16 Mil/uL (ref 3.87–5.11)
RDW: 13.4 % (ref 11.5–15.5)
WBC: 5.2 10*3/uL (ref 4.0–10.5)

## 2014-01-19 NOTE — Assessment & Plan Note (Signed)
Lab Results  Component Value Date   HGBA1C 6.8* 09/29/2013   Continue current insulin regimen. Discussed possible increase in glucoses from systemic steroids for COPD exacerbation, may need to up-titrate insulins accordingly.

## 2014-01-19 NOTE — Assessment & Plan Note (Signed)
Flu vaccine IM and prevnar 13 IM today.

## 2014-01-19 NOTE — Assessment & Plan Note (Signed)
20% subjective improvement with albut/atrov neb in office today, with improved aeration on exam as well. Prednisone 40mg  qd x 5d, then 20mg  qd x 5d. Albuterol neb solution rx'd for home use.  She has albut HFA already.  Continue oxygen support at current setting of 3.5 L/m but I told her to turn this down if she began feeling better. Check BMET and CBC today.

## 2014-01-19 NOTE — Assessment & Plan Note (Signed)
Pretty stable. Has CRI, must avoid being overly aggressive with lasix. Low Na emphasized, elevate legs.

## 2014-01-19 NOTE — Assessment & Plan Note (Signed)
The current medical regimen is effective;  continue present plan and medications. Lytes/cr today. 

## 2014-01-19 NOTE — Assessment & Plan Note (Signed)
Fibromyalgia, osteoarthritis of back, neck, knees. Continue current pain meds. She has initial consult appt with Dr. [name], Pain Mgmt MD, very soon.

## 2014-01-19 NOTE — Assessment & Plan Note (Signed)
Scheduled for FNA with endocrinologist very soon. Last TSH 10/07/13 wnl.

## 2014-01-22 ENCOUNTER — Ambulatory Visit: Payer: Medicare Other | Admitting: Family Medicine

## 2014-01-27 ENCOUNTER — Ambulatory Visit
Admission: RE | Admit: 2014-01-27 | Discharge: 2014-01-27 | Disposition: A | Discharge status: Home / Self Care | Payer: Medicare Other | Source: Ambulatory Visit | Attending: Internal Medicine | Admitting: Internal Medicine

## 2014-01-27 ENCOUNTER — Other Ambulatory Visit (HOSPITAL_COMMUNITY)
Admission: RE | Admit: 2014-01-27 | Discharge: 2014-01-27 | Disposition: A | Discharge status: Home / Self Care | Payer: Medicare Other | Source: Ambulatory Visit | Attending: Interventional Radiology | Admitting: Interventional Radiology

## 2014-01-27 DIAGNOSIS — E041 Nontoxic single thyroid nodule: Secondary | ICD-10-CM | POA: Diagnosis present

## 2014-01-27 DIAGNOSIS — E042 Nontoxic multinodular goiter: Secondary | ICD-10-CM

## 2014-01-28 ENCOUNTER — Encounter: Payer: Self-pay | Admitting: Family Medicine

## 2014-02-04 ENCOUNTER — Other Ambulatory Visit: Payer: Self-pay | Admitting: Family Medicine

## 2014-02-05 ENCOUNTER — Telehealth: Payer: Self-pay | Admitting: *Deleted

## 2014-02-05 NOTE — Telephone Encounter (Signed)
Form signed/completed.

## 2014-02-05 NOTE — Telephone Encounter (Signed)
Form has been filled out and is waiting on your desk.

## 2014-02-05 NOTE — Telephone Encounter (Signed)
Left detailed message vm.

## 2014-02-05 NOTE — Telephone Encounter (Signed)
Talked with pt. No additional RF desired at this time. She was just passing on to me the endo/bx results and the plan.

## 2014-02-05 NOTE — Telephone Encounter (Signed)
Patient left vm requesting another referral for follow-up US Thyroid Biopsy. Patient stated that results from previous US were inconclusive. Also, pt is requesting handicap form for [place] plate. Patient stated that she [place] one from out state. Please advise?

## 2014-02-05 NOTE — Telephone Encounter (Signed)
Will you get a Handicap info card from [place] and I'll sign it.-thx

## 2014-02-09 ENCOUNTER — Other Ambulatory Visit: Payer: Self-pay | Admitting: Family Medicine

## 2014-02-09 MED ORDER — LEVOTHYROXINE SODIUM 100 MCG PO TABS: 100.0000 ug | ORAL_TABLET | Freq: Every day | ORAL | Status: DC

## 2014-02-11 ENCOUNTER — Encounter (HOSPITAL_COMMUNITY): Payer: Self-pay | Admitting: Emergency Medicine

## 2014-02-11 ENCOUNTER — Emergency Department (HOSPITAL_COMMUNITY): Payer: Medicare Other

## 2014-02-11 ENCOUNTER — Inpatient Hospital Stay (HOSPITAL_COMMUNITY)
Admission: EM | Admit: 2014-02-11 | Discharge: 2014-02-16 | DRG: 493 | Disposition: A | Discharge status: Skilled Nursing Facility | Payer: Medicare Other | Attending: Orthopedic Surgery | Admitting: Orthopedic Surgery

## 2014-02-11 ENCOUNTER — Encounter (HOSPITAL_COMMUNITY)
Admission: EM | Disposition: A | Discharge status: Skilled Nursing Facility | Payer: Self-pay | Source: Home / Self Care | Attending: Orthopedic Surgery

## 2014-02-11 ENCOUNTER — Inpatient Hospital Stay (HOSPITAL_COMMUNITY): Payer: Medicare Other | Admitting: Anesthesiology

## 2014-02-11 ENCOUNTER — Encounter (HOSPITAL_COMMUNITY): Payer: Medicare Other | Admitting: Anesthesiology

## 2014-02-11 ENCOUNTER — Inpatient Hospital Stay (HOSPITAL_COMMUNITY): Payer: Medicare Other

## 2014-02-11 DIAGNOSIS — E039 Hypothyroidism, unspecified: Secondary | ICD-10-CM | POA: Diagnosis present

## 2014-02-11 DIAGNOSIS — S82899A Other fracture of unspecified lower leg, initial encounter for closed fracture: Secondary | ICD-10-CM

## 2014-02-11 DIAGNOSIS — Z7982 Long term (current) use of aspirin: Secondary | ICD-10-CM | POA: Diagnosis not present

## 2014-02-11 DIAGNOSIS — D62 Acute posthemorrhagic anemia: Secondary | ICD-10-CM | POA: Diagnosis not present

## 2014-02-11 DIAGNOSIS — W19XXXA Unspecified fall, initial encounter: Secondary | ICD-10-CM

## 2014-02-11 DIAGNOSIS — C50911 Malignant neoplasm of unspecified site of right female breast: Secondary | ICD-10-CM | POA: Diagnosis present

## 2014-02-11 DIAGNOSIS — Z794 Long term (current) use of insulin: Secondary | ICD-10-CM

## 2014-02-11 DIAGNOSIS — E785 Hyperlipidemia, unspecified: Secondary | ICD-10-CM | POA: Diagnosis present

## 2014-02-11 DIAGNOSIS — D696 Thrombocytopenia, unspecified: Secondary | ICD-10-CM | POA: Diagnosis present

## 2014-02-11 DIAGNOSIS — W1830XA Fall on same level, unspecified, initial encounter: Secondary | ICD-10-CM | POA: Diagnosis present

## 2014-02-11 DIAGNOSIS — G894 Chronic pain syndrome: Secondary | ICD-10-CM | POA: Diagnosis present

## 2014-02-11 DIAGNOSIS — E119 Type 2 diabetes mellitus without complications: Secondary | ICD-10-CM | POA: Diagnosis present

## 2014-02-11 DIAGNOSIS — I10 Essential (primary) hypertension: Secondary | ICD-10-CM | POA: Diagnosis present

## 2014-02-11 DIAGNOSIS — N183 Chronic kidney disease, stage 3 unspecified: Secondary | ICD-10-CM | POA: Diagnosis present

## 2014-02-11 DIAGNOSIS — M797 Fibromyalgia: Secondary | ICD-10-CM | POA: Diagnosis present

## 2014-02-11 DIAGNOSIS — Z8673 Personal history of transient ischemic attack (TIA), and cerebral infarction without residual deficits: Secondary | ICD-10-CM

## 2014-02-11 DIAGNOSIS — J438 Other emphysema: Secondary | ICD-10-CM

## 2014-02-11 DIAGNOSIS — Z6838 Body mass index (BMI) 38.0-38.9, adult: Secondary | ICD-10-CM | POA: Diagnosis not present

## 2014-02-11 DIAGNOSIS — Y92099 Unspecified place in other non-institutional residence as the place of occurrence of the external cause: Secondary | ICD-10-CM

## 2014-02-11 DIAGNOSIS — I129 Hypertensive chronic kidney disease with stage 1 through stage 4 chronic kidney disease, or unspecified chronic kidney disease: Secondary | ICD-10-CM | POA: Diagnosis present

## 2014-02-11 DIAGNOSIS — Z01811 Encounter for preprocedural respiratory examination: Secondary | ICD-10-CM

## 2014-02-11 DIAGNOSIS — Z923 Personal history of irradiation: Secondary | ICD-10-CM | POA: Diagnosis not present

## 2014-02-11 DIAGNOSIS — I5032 Chronic diastolic (congestive) heart failure: Secondary | ICD-10-CM | POA: Diagnosis present

## 2014-02-11 DIAGNOSIS — N2889 Other specified disorders of kidney and ureter: Secondary | ICD-10-CM | POA: Diagnosis present

## 2014-02-11 DIAGNOSIS — Z7902 Long term (current) use of antithrombotics/antiplatelets: Secondary | ICD-10-CM

## 2014-02-11 DIAGNOSIS — F419 Anxiety disorder, unspecified: Secondary | ICD-10-CM | POA: Diagnosis present

## 2014-02-11 DIAGNOSIS — Z79811 Long term (current) use of aromatase inhibitors: Secondary | ICD-10-CM | POA: Diagnosis not present

## 2014-02-11 DIAGNOSIS — K219 Gastro-esophageal reflux disease without esophagitis: Secondary | ICD-10-CM | POA: Diagnosis present

## 2014-02-11 DIAGNOSIS — Z87891 Personal history of nicotine dependence: Secondary | ICD-10-CM | POA: Diagnosis not present

## 2014-02-11 DIAGNOSIS — J449 Chronic obstructive pulmonary disease, unspecified: Secondary | ICD-10-CM | POA: Diagnosis present

## 2014-02-11 DIAGNOSIS — Y92009 Unspecified place in unspecified non-institutional (private) residence as the place of occurrence of the external cause: Secondary | ICD-10-CM

## 2014-02-11 DIAGNOSIS — S82851S Displaced trimalleolar fracture of right lower leg, sequela: Secondary | ICD-10-CM

## 2014-02-11 DIAGNOSIS — S82851A Displaced trimalleolar fracture of right lower leg, initial encounter for closed fracture: Secondary | ICD-10-CM | POA: Diagnosis present

## 2014-02-11 DIAGNOSIS — S82851D Displaced trimalleolar fracture of right lower leg, subsequent encounter for closed fracture with routine healing: Secondary | ICD-10-CM

## 2014-02-11 DIAGNOSIS — M25571 Pain in right ankle and joints of right foot: Secondary | ICD-10-CM | POA: Diagnosis present

## 2014-02-11 DIAGNOSIS — E042 Nontoxic multinodular goiter: Secondary | ICD-10-CM | POA: Diagnosis present

## 2014-02-11 DIAGNOSIS — F329 Major depressive disorder, single episode, unspecified: Secondary | ICD-10-CM | POA: Diagnosis present

## 2014-02-11 HISTORY — PX: ORIF ANKLE FRACTURE: SHX5408

## 2014-02-11 LAB — CBC WITH DIFFERENTIAL/PLATELET
BASOS ABS: 0 10*3/uL (ref 0.0–0.1)
BASOS PCT: 0 % (ref 0–1)
Eosinophils Absolute: 0.1 10*3/uL (ref 0.0–0.7)
Eosinophils Relative: 1 % (ref 0–5)
HCT: 31.5 % — ABNORMAL LOW (ref 36.0–46.0)
HEMOGLOBIN: 10 g/dL — AB (ref 12.0–15.0)
LYMPHS PCT: 9 % — AB (ref 12–46)
Lymphs Abs: 0.8 10*3/uL (ref 0.7–4.0)
MCH: 26.8 pg (ref 26.0–34.0)
MCHC: 31.7 g/dL (ref 30.0–36.0)
MCV: 84.5 fL (ref 78.0–100.0)
Monocytes Absolute: 0.5 10*3/uL (ref 0.1–1.0)
Monocytes Relative: 5 % (ref 3–12)
NEUTROS PCT: 84 % — AB (ref 43–77)
Neutro Abs: 7.1 10*3/uL (ref 1.7–7.7)
Platelets: 94 10*3/uL — ABNORMAL LOW (ref 150–400)
RBC: 3.73 MIL/uL — ABNORMAL LOW (ref 3.87–5.11)
RDW: 13.3 % (ref 11.5–15.5)
WBC: 8.5 10*3/uL (ref 4.0–10.5)

## 2014-02-11 LAB — BASIC METABOLIC PANEL
Anion gap: 9 (ref 5–15)
BUN: 43 mg/dL — ABNORMAL HIGH (ref 6–23)
CALCIUM: 9.5 mg/dL (ref 8.4–10.5)
CO2: 34 mEq/L — ABNORMAL HIGH (ref 19–32)
Chloride: 99 mEq/L (ref 96–112)
Creatinine, Ser: 0.68 mg/dL (ref 0.50–1.10)
GFR calc Af Amer: >90 mL/min (ref >90)
GFR, EST NON AFRICAN AMERICAN: 87 mL/min — AB (ref >90)
Glucose, Bld: 176 mg/dL — ABNORMAL HIGH (ref 70–99)
Potassium: 5 mEq/L (ref 3.7–5.3)
SODIUM: 142 meq/L (ref 137–147)

## 2014-02-11 LAB — GLUCOSE, CAPILLARY: GLUCOSE-CAPILLARY: 160 mg/dL — AB (ref 70–99)

## 2014-02-11 LAB — PROTIME-INR
INR: 1.02 (ref 0.00–1.49)
Prothrombin Time: 13.5 seconds (ref 11.6–15.2)

## 2014-02-11 LAB — CBG MONITORING, ED: GLUCOSE-CAPILLARY: 169 mg/dL — AB (ref 70–99)

## 2014-02-11 SURGERY — OPEN REDUCTION INTERNAL FIXATION (ORIF) ANKLE FRACTURE
Anesthesia: Monitor Anesthesia Care | Laterality: Right

## 2014-02-11 MED ORDER — HYDROMORPHONE HCL 1 MG/ML IJ SOLN: 0.2500 mg | INTRAMUSCULAR | Status: DC | PRN

## 2014-02-11 MED ORDER — CLOPIDOGREL BISULFATE 75 MG PO TABS
75.0000 mg | ORAL_TABLET | Freq: Every day | ORAL | Status: DC
  Administered 2014-02-11 – 2014-02-16 (×6): 75 mg via ORAL
  Filled 2014-02-11 (×6): qty 1

## 2014-02-11 MED ORDER — ONDANSETRON HCL 4 MG/2ML IJ SOLN: 4.0000 mg | Freq: Three times a day (TID) | INTRAMUSCULAR | Status: AC | PRN

## 2014-02-11 MED ORDER — CALCIUM GLUCONATE 500 MG PO TABS
2.0000 | ORAL_TABLET | Freq: Every day | ORAL | Status: DC
  Administered 2014-02-11 – 2014-02-16 (×6): 1000 mg via ORAL
  Filled 2014-02-11 (×6): qty 2

## 2014-02-11 MED ORDER — ONDANSETRON HCL 4 MG/2ML IJ SOLN: 4.0000 mg | Freq: Four times a day (QID) | INTRAMUSCULAR | Status: DC | PRN

## 2014-02-11 MED ORDER — PROPOFOL 10 MG/ML IV BOLUS
INTRAVENOUS | Status: AC
  Filled 2014-02-11: qty 20

## 2014-02-11 MED ORDER — MIDAZOLAM HCL 5 MG/5ML IJ SOLN
INTRAMUSCULAR | Status: DC | PRN
  Administered 2014-02-11: 2 mg via INTRAVENOUS

## 2014-02-11 MED ORDER — INSULIN ASPART 100 UNIT/ML ~~LOC~~ SOLN
0.0000 [IU] | Freq: Three times a day (TID) | SUBCUTANEOUS | Status: DC
  Administered 2014-02-12: 8 [IU] via SUBCUTANEOUS
  Administered 2014-02-12: 3 [IU] via SUBCUTANEOUS
  Administered 2014-02-12: 5 [IU] via SUBCUTANEOUS
  Administered 2014-02-13 (×3): 2 [IU] via SUBCUTANEOUS
  Administered 2014-02-14: 5 [IU] via SUBCUTANEOUS
  Administered 2014-02-14 – 2014-02-15 (×3): 2 [IU] via SUBCUTANEOUS
  Administered 2014-02-15: 5 [IU] via SUBCUTANEOUS
  Administered 2014-02-16: 2 [IU] via SUBCUTANEOUS

## 2014-02-11 MED ORDER — MECLIZINE HCL 25 MG PO TABS
25.0000 mg | ORAL_TABLET | Freq: Every day | ORAL | Status: DC
  Filled 2014-02-11 (×2): qty 1

## 2014-02-11 MED ORDER — SODIUM CHLORIDE 0.9 % IJ SOLN
3.0000 mL | Freq: Two times a day (BID) | INTRAMUSCULAR | Status: DC
  Administered 2014-02-11 – 2014-02-16 (×10): 3 mL via INTRAVENOUS

## 2014-02-11 MED ORDER — ACETAMINOPHEN 325 MG PO TABS
650.0000 mg | ORAL_TABLET | Freq: Four times a day (QID) | ORAL | Status: DC | PRN
Start: 2014-02-11 — End: 2014-02-16
  Administered 2014-02-14 (×2): 650 mg via ORAL
  Filled 2014-02-11 (×2): qty 2

## 2014-02-11 MED ORDER — MORPHINE SULFATE ER 15 MG PO TBCR: 15.0000 mg | EXTENDED_RELEASE_TABLET | Freq: Two times a day (BID) | ORAL | Status: DC

## 2014-02-11 MED ORDER — ROPINIROLE HCL 1 MG PO TABS
2.0000 mg | ORAL_TABLET | Freq: Every day | ORAL | Status: DC
  Administered 2014-02-11 – 2014-02-15 (×5): 2 mg via ORAL
  Filled 2014-02-11 (×6): qty 2

## 2014-02-11 MED ORDER — ADULT MULTIVITAMIN W/MINERALS CH
1.0000 | ORAL_TABLET | Freq: Every day | ORAL | Status: DC
  Administered 2014-02-11 – 2014-02-16 (×6): 1 via ORAL
  Filled 2014-02-11 (×6): qty 1

## 2014-02-11 MED ORDER — ACETAMINOPHEN 650 MG RE SUPP
650.0000 mg | Freq: Four times a day (QID) | RECTAL | Status: DC | PRN
Start: 2014-02-11 — End: 2014-02-16

## 2014-02-11 MED ORDER — LEVOTHYROXINE SODIUM 100 MCG PO TABS
100.0000 ug | ORAL_TABLET | Freq: Every day | ORAL | Status: DC
  Administered 2014-02-12 – 2014-02-16 (×5): 100 ug via ORAL
  Filled 2014-02-11 (×6): qty 1

## 2014-02-11 MED ORDER — ALPRAZOLAM 0.5 MG PO TABS
0.5000 mg | ORAL_TABLET | Freq: Every day | ORAL | Status: DC | PRN
  Administered 2014-02-13 – 2014-02-15 (×4): 0.5 mg via ORAL
  Filled 2014-02-11 (×4): qty 1

## 2014-02-11 MED ORDER — PREGABALIN 75 MG PO CAPS
150.0000 mg | ORAL_CAPSULE | Freq: Two times a day (BID) | ORAL | Status: DC
  Administered 2014-02-11 – 2014-02-16 (×10): 150 mg via ORAL
  Filled 2014-02-11 (×10): qty 2

## 2014-02-11 MED ORDER — HYDROMORPHONE HCL 1 MG/ML IJ SOLN
1.0000 mg | Freq: Once | INTRAMUSCULAR | Status: AC
  Administered 2014-02-11: 1 mg via INTRAVENOUS
  Filled 2014-02-11: qty 1

## 2014-02-11 MED ORDER — CEFAZOLIN SODIUM-DEXTROSE 2-3 GM-% IV SOLR
INTRAVENOUS | Status: AC
  Filled 2014-02-11: qty 50

## 2014-02-11 MED ORDER — LACTATED RINGERS IV SOLN
INTRAVENOUS | Status: DC
  Administered 2014-02-11: 16:00:00 via INTRAVENOUS

## 2014-02-11 MED ORDER — FENTANYL CITRATE 0.05 MG/ML IJ SOLN
INTRAMUSCULAR | Status: DC | PRN
Start: 2014-02-11 — End: 2014-02-11
  Administered 2014-02-11 (×2): 25 ug via INTRAVENOUS
  Administered 2014-02-11 (×2): 50 ug via INTRAVENOUS
  Administered 2014-02-11: 25 ug via INTRAVENOUS
  Administered 2014-02-11: 50 ug via INTRAVENOUS
  Administered 2014-02-11: 25 ug via INTRAVENOUS

## 2014-02-11 MED ORDER — DIPHENHYDRAMINE HCL 50 MG/ML IJ SOLN
INTRAMUSCULAR | Status: DC | PRN
  Administered 2014-02-11: 12.5 mg via INTRAVENOUS

## 2014-02-11 MED ORDER — MAGNESIUM OXIDE (LAXATIVE) 500 MG PO TABS: 500.0000 mg | ORAL_TABLET | Freq: Two times a day (BID) | ORAL | Status: DC

## 2014-02-11 MED ORDER — ASPIRIN EC 81 MG PO TBEC
81.0000 mg | DELAYED_RELEASE_TABLET | Freq: Every day | ORAL | Status: DC
Start: 2014-02-11 — End: 2014-02-16
  Administered 2014-02-11 – 2014-02-16 (×6): 81 mg via ORAL
  Filled 2014-02-11 (×6): qty 1

## 2014-02-11 MED ORDER — PROPOFOL INFUSION 10 MG/ML OPTIME
INTRAVENOUS | Status: DC | PRN
  Administered 2014-02-11: 75 ug/kg/min via INTRAVENOUS

## 2014-02-11 MED ORDER — 0.9 % SODIUM CHLORIDE (POUR BTL) OPTIME
TOPICAL | Status: DC | PRN
  Administered 2014-02-11: 1000 mL

## 2014-02-11 MED ORDER — MIDAZOLAM HCL 2 MG/2ML IJ SOLN
INTRAMUSCULAR | Status: AC
  Filled 2014-02-11: qty 2

## 2014-02-11 MED ORDER — HYDROMORPHONE HCL 1 MG/ML IJ SOLN
1.0000 mg | INTRAMUSCULAR | Status: AC | PRN
  Administered 2014-02-11 – 2014-02-13 (×12): 1 mg via INTRAVENOUS
  Filled 2014-02-11 (×12): qty 1

## 2014-02-11 MED ORDER — TIOTROPIUM BROMIDE MONOHYDRATE 18 MCG IN CAPS
18.0000 ug | ORAL_CAPSULE | Freq: Every day | RESPIRATORY_TRACT | Status: DC
  Administered 2014-02-12 – 2014-02-16 (×5): 18 ug via RESPIRATORY_TRACT
  Filled 2014-02-11 (×2): qty 5

## 2014-02-11 MED ORDER — MAGNESIUM OXIDE 400 (241.3 MG) MG PO TABS
400.0000 mg | ORAL_TABLET | Freq: Two times a day (BID) | ORAL | Status: DC
  Administered 2014-02-11 – 2014-02-16 (×10): 400 mg via ORAL
  Filled 2014-02-11 (×11): qty 1

## 2014-02-11 MED ORDER — ONDANSETRON HCL 4 MG PO TABS
4.0000 mg | ORAL_TABLET | Freq: Four times a day (QID) | ORAL | Status: DC | PRN
Start: 2014-02-11 — End: 2014-02-16

## 2014-02-11 MED ORDER — ROPIVACAINE HCL 5 MG/ML IJ SOLN
INTRAMUSCULAR | Status: DC | PRN
  Administered 2014-02-11: 12 mL via PERINEURAL
  Administered 2014-02-11: 30 mL via PERINEURAL

## 2014-02-11 MED ORDER — INSULIN GLARGINE 100 UNIT/ML SOLOSTAR PEN: 40.0000 [IU] | PEN_INJECTOR | Freq: Two times a day (BID) | SUBCUTANEOUS | Status: DC

## 2014-02-11 MED ORDER — ATORVASTATIN CALCIUM 40 MG PO TABS
40.0000 mg | ORAL_TABLET | Freq: Every day | ORAL | Status: DC
  Administered 2014-02-11 – 2014-02-16 (×6): 40 mg via ORAL
  Filled 2014-02-11 (×6): qty 1

## 2014-02-11 MED ORDER — MORPHINE SULFATE 4 MG/ML IJ SOLN
6.0000 mg | Freq: Once | INTRAMUSCULAR | Status: AC
  Administered 2014-02-11: 6 mg via INTRAVENOUS
  Filled 2014-02-11: qty 2

## 2014-02-11 MED ORDER — INSULIN ASPART 100 UNIT/ML ~~LOC~~ SOLN
6.0000 [IU] | Freq: Three times a day (TID) | SUBCUTANEOUS | Status: DC
  Administered 2014-02-12 – 2014-02-16 (×13): 6 [IU] via SUBCUTANEOUS

## 2014-02-11 MED ORDER — POTASSIUM CHLORIDE CRYS ER 20 MEQ PO TBCR
20.0000 meq | EXTENDED_RELEASE_TABLET | Freq: Every day | ORAL | Status: DC
  Administered 2014-02-12 – 2014-02-16 (×5): 20 meq via ORAL
  Filled 2014-02-11 (×6): qty 1

## 2014-02-11 MED ORDER — MORPHINE SULFATE ER 15 MG PO TBCR
30.0000 mg | EXTENDED_RELEASE_TABLET | Freq: Every day | ORAL | Status: DC
  Administered 2014-02-11 – 2014-02-15 (×5): 30 mg via ORAL
  Filled 2014-02-11 (×5): qty 2

## 2014-02-11 MED ORDER — OXYCODONE HCL 5 MG PO TABS: 5.0000 mg | ORAL_TABLET | Freq: Once | ORAL | Status: DC | PRN

## 2014-02-11 MED ORDER — LISINOPRIL 10 MG PO TABS
10.0000 mg | ORAL_TABLET | Freq: Every day | ORAL | Status: DC
  Administered 2014-02-11 – 2014-02-16 (×6): 10 mg via ORAL
  Filled 2014-02-11 (×6): qty 1

## 2014-02-11 MED ORDER — CLINDAMYCIN PHOSPHATE 600 MG/50ML IV SOLN
600.0000 mg | Freq: Three times a day (TID) | INTRAVENOUS | Status: AC
  Administered 2014-02-11 – 2014-02-12 (×3): 600 mg via INTRAVENOUS
  Filled 2014-02-11 (×3): qty 50

## 2014-02-11 MED ORDER — ALBUTEROL SULFATE HFA 108 (90 BASE) MCG/ACT IN AERS: 2.0000 | INHALATION_SPRAY | Freq: Four times a day (QID) | RESPIRATORY_TRACT | Status: DC | PRN

## 2014-02-11 MED ORDER — MORPHINE SULFATE ER 15 MG PO TBCR
15.0000 mg | EXTENDED_RELEASE_TABLET | Freq: Two times a day (BID) | ORAL | Status: DC
  Administered 2014-02-11: 15 mg via ORAL
  Filled 2014-02-11: qty 1

## 2014-02-11 MED ORDER — FENTANYL CITRATE 0.05 MG/ML IJ SOLN
INTRAMUSCULAR | Status: AC
  Filled 2014-02-11: qty 5

## 2014-02-11 MED ORDER — OXYCODONE-ACETAMINOPHEN 5-325 MG PO TABS: 1.0000 | ORAL_TABLET | Freq: Once | ORAL | Status: DC

## 2014-02-11 MED ORDER — ENOXAPARIN SODIUM 40 MG/0.4ML ~~LOC~~ SOLN
40.0000 mg | SUBCUTANEOUS | Status: DC
  Administered 2014-02-11 – 2014-02-15 (×5): 40 mg via SUBCUTANEOUS
  Filled 2014-02-11 (×6): qty 0.4

## 2014-02-11 MED ORDER — LACTATED RINGERS IV SOLN
INTRAVENOUS | Status: DC
  Administered 2014-02-11: 22:00:00 via INTRAVENOUS

## 2014-02-11 MED ORDER — ALBUTEROL SULFATE (2.5 MG/3ML) 0.083% IN NEBU: 2.5000 mg | INHALATION_SOLUTION | RESPIRATORY_TRACT | Status: DC | PRN

## 2014-02-11 MED ORDER — INSULIN GLARGINE 100 UNIT/ML ~~LOC~~ SOLN
40.0000 [IU] | Freq: Two times a day (BID) | SUBCUTANEOUS | Status: DC
  Administered 2014-02-11 – 2014-02-16 (×10): 40 [IU] via SUBCUTANEOUS
  Filled 2014-02-11 (×13): qty 0.4

## 2014-02-11 MED ORDER — PANTOPRAZOLE SODIUM 40 MG PO TBEC
80.0000 mg | DELAYED_RELEASE_TABLET | Freq: Every day | ORAL | Status: DC
  Administered 2014-02-11 – 2014-02-16 (×6): 80 mg via ORAL
  Filled 2014-02-11 (×6): qty 2

## 2014-02-11 MED ORDER — MORPHINE SULFATE ER 15 MG PO TBCR
15.0000 mg | EXTENDED_RELEASE_TABLET | Freq: Every morning | ORAL | Status: DC
  Administered 2014-02-12 – 2014-02-16 (×5): 15 mg via ORAL
  Filled 2014-02-11 (×6): qty 1

## 2014-02-11 MED ORDER — LACTATED RINGERS IV SOLN
INTRAVENOUS | Status: DC | PRN
  Administered 2014-02-11 (×2): via INTRAVENOUS

## 2014-02-11 MED ORDER — LIDOCAINE HCL (CARDIAC) 20 MG/ML IV SOLN
INTRAVENOUS | Status: DC | PRN
  Administered 2014-02-11: 50 mg via INTRAVENOUS

## 2014-02-11 MED ORDER — INSULIN ASPART 100 UNIT/ML ~~LOC~~ SOLN: 0.0000 [IU] | Freq: Every day | SUBCUTANEOUS | Status: DC

## 2014-02-11 MED ORDER — HYDROMORPHONE HCL 1 MG/ML IJ SOLN: 1.0000 mg | Freq: Once | INTRAMUSCULAR | Status: DC

## 2014-02-11 MED ORDER — FUROSEMIDE 40 MG PO TABS
40.0000 mg | ORAL_TABLET | Freq: Every day | ORAL | Status: DC
  Administered 2014-02-12 – 2014-02-16 (×5): 40 mg via ORAL
  Filled 2014-02-11 (×6): qty 1

## 2014-02-11 MED ORDER — ALBUTEROL SULFATE (2.5 MG/3ML) 0.083% IN NEBU
2.5000 mg | INHALATION_SOLUTION | Freq: Four times a day (QID) | RESPIRATORY_TRACT | Status: DC
  Administered 2014-02-12 – 2014-02-13 (×5): 2.5 mg via RESPIRATORY_TRACT
  Filled 2014-02-11 (×5): qty 3

## 2014-02-11 MED ORDER — CEFAZOLIN SODIUM-DEXTROSE 2-3 GM-% IV SOLR
INTRAVENOUS | Status: DC | PRN
  Administered 2014-02-11: 2 g via INTRAVENOUS

## 2014-02-11 MED ORDER — CITALOPRAM HYDROBROMIDE 40 MG PO TABS
40.0000 mg | ORAL_TABLET | Freq: Every day | ORAL | Status: DC
  Administered 2014-02-11 – 2014-02-16 (×6): 40 mg via ORAL
  Filled 2014-02-11 (×6): qty 1

## 2014-02-11 MED ORDER — OXYCODONE HCL 5 MG/5ML PO SOLN: 5.0000 mg | Freq: Once | ORAL | Status: DC | PRN

## 2014-02-11 SURGICAL SUPPLY — 63 items
BANDAGE ELASTIC 4 VELCRO ST LF (GAUZE/BANDAGES/DRESSINGS) ×3 IMPLANT
BANDAGE ELASTIC 6 VELCRO ST LF (GAUZE/BANDAGES/DRESSINGS) ×3 IMPLANT
BANDAGE ESMARK 6X9 LF (GAUZE/BANDAGES/DRESSINGS) ×1 IMPLANT
BIT DRILL 2.5X2.75 QC CALB (BIT) ×3 IMPLANT
BIT DRILL 3.5X5.5 QC CALB (BIT) ×3 IMPLANT
BLADE SURG 10 STRL SS (BLADE) ×3 IMPLANT
BLADE SURG ROTATE 9660 (MISCELLANEOUS) IMPLANT
BNDG ESMARK 6X9 LF (GAUZE/BANDAGES/DRESSINGS) ×3
COVER MAYO STAND STRL (DRAPES) ×3 IMPLANT
COVER SURGICAL LIGHT HANDLE (MISCELLANEOUS) ×3 IMPLANT
CUFF TOURNIQUET SINGLE 34IN LL (TOURNIQUET CUFF) IMPLANT
CUFF TOURNIQUET SINGLE 44IN (TOURNIQUET CUFF) IMPLANT
DRAPE OEC MINIVIEW 54X84 (DRAPES) IMPLANT
DRAPE U-SHAPE 47X51 STRL (DRAPES) ×3 IMPLANT
DRSG PAD ABDOMINAL 8X10 ST (GAUZE/BANDAGES/DRESSINGS) ×3 IMPLANT
DURAPREP 26ML APPLICATOR (WOUND CARE) ×3 IMPLANT
ELECT REM PT RETURN 9FT ADLT (ELECTROSURGICAL) ×3
ELECTRODE REM PT RTRN 9FT ADLT (ELECTROSURGICAL) ×1 IMPLANT
GAUZE SPONGE 4X4 12PLY STRL (GAUZE/BANDAGES/DRESSINGS) IMPLANT
GAUZE XEROFORM 1X8 LF (GAUZE/BANDAGES/DRESSINGS) IMPLANT
GAUZE XEROFORM 5X9 LF (GAUZE/BANDAGES/DRESSINGS) ×3 IMPLANT
GLOVE BIOGEL PI IND STRL 8 (GLOVE) ×2 IMPLANT
GLOVE BIOGEL PI INDICATOR 8 (GLOVE) ×4
GLOVE ECLIPSE 7.5 STRL STRAW (GLOVE) ×6 IMPLANT
GOWN STRL REUS W/ TWL LRG LVL3 (GOWN DISPOSABLE) ×1 IMPLANT
GOWN STRL REUS W/ TWL XL LVL3 (GOWN DISPOSABLE) ×2 IMPLANT
GOWN STRL REUS W/TWL LRG LVL3 (GOWN DISPOSABLE) ×2
GOWN STRL REUS W/TWL XL LVL3 (GOWN DISPOSABLE) ×4
K-WIRE ACE 1.6X6 (WIRE) ×6
KIT BASIN OR (CUSTOM PROCEDURE TRAY) ×3 IMPLANT
KIT ROOM TURNOVER OR (KITS) ×3 IMPLANT
KWIRE ACE 1.6X6 (WIRE) ×2 IMPLANT
MANIFOLD NEPTUNE II (INSTRUMENTS) ×3 IMPLANT
NS IRRIG 1000ML POUR BTL (IV SOLUTION) ×3 IMPLANT
PACK ORTHO EXTREMITY (CUSTOM PROCEDURE TRAY) ×3 IMPLANT
PAD ARMBOARD 7.5X6 YLW CONV (MISCELLANEOUS) ×6 IMPLANT
PAD CAST 4YDX4 CTTN HI CHSV (CAST SUPPLIES) ×1 IMPLANT
PADDING CAST COTTON 4X4 STRL (CAST SUPPLIES) ×2
PADDING CAST COTTON 6X4 STRL (CAST SUPPLIES) ×3 IMPLANT
PLATE ACE 100DEG 7HOLE (Plate) ×3 IMPLANT
SCREW ACE CAN 4.0 44M (Screw) ×3 IMPLANT
SCREW ACE CAN 4.0 50M (Screw) ×3 IMPLANT
SCREW CORTICAL 3.5MM  12MM (Screw) ×2 IMPLANT
SCREW CORTICAL 3.5MM  20MM (Screw) ×2 IMPLANT
SCREW CORTICAL 3.5MM 12MM (Screw) ×1 IMPLANT
SCREW CORTICAL 3.5MM 14MM (Screw) ×9 IMPLANT
SCREW CORTICAL 3.5MM 20MM (Screw) ×1 IMPLANT
SCREW NLOCK CANC HEX 4X14 (Screw) ×3 IMPLANT
SCREW NLOCK CANC HEX 4X16 (Screw) ×6 IMPLANT
SPLINT FIBERGLASS 4X30 (CAST SUPPLIES) ×6 IMPLANT
SPONGE GAUZE 4X4 12PLY STER LF (GAUZE/BANDAGES/DRESSINGS) ×3 IMPLANT
SPONGE LAP 4X18 X RAY DECT (DISPOSABLE) ×6 IMPLANT
STAPLER VISISTAT 35W (STAPLE) IMPLANT
SUCTION FRAZIER TIP 10 FR DISP (SUCTIONS) ×3 IMPLANT
SUT ETHILON 4 0 PS 2 18 (SUTURE) IMPLANT
SUT VIC AB 0 CTB1 27 (SUTURE) IMPLANT
SUT VIC AB 2-0 FS1 27 (SUTURE) IMPLANT
SYR CONTROL 10ML LL (SYRINGE) IMPLANT
TOWEL OR 17X24 6PK STRL BLUE (TOWEL DISPOSABLE) ×3 IMPLANT
TOWEL OR 17X26 10 PK STRL BLUE (TOWEL DISPOSABLE) ×3 IMPLANT
TUBE CONNECTING 12'X1/4 (SUCTIONS) ×1
TUBE CONNECTING 12X1/4 (SUCTIONS) ×2 IMPLANT
WATER STERILE IRR 1000ML POUR (IV SOLUTION) ×3 IMPLANT

## 2014-02-11 NOTE — Anesthesia Procedure Notes (Addendum)
Anesthesia Regional Block:  Popliteal block  Pre-Anesthetic Checklist: ,, timeout performed, Correct Patient, Correct Site, Correct Laterality, Correct Procedure, Correct Position, site marked, Risks and benefits discussed,  Surgical consent,  Pre-op evaluation,  At surgeon's request and post-op pain management  Laterality: Lower and Right  Prep: chloraprep       Needles:  Injection technique: Single-shot  Needle Type: Echogenic Stimulator Needle          Additional Needles:  Procedures: ultrasound guided (picture in chart) and nerve stimulator Popliteal block  Nerve Stimulator or Paresthesia:  Response: plantarflexion, 0.5 mA,   Additional Responses:   Narrative:  Start time: 02/11/2014 5:21 PM End time: 02/11/2014 5:27 PM Injection made incrementally with aspirations every 5 mL.  Performed by: Personally  Anesthesiologist: Sargun Rummell  Additional Notes: H+P and labs reviewed, risks and benefits discussed with patient, procedure tolerated well without complications   Anesthesia Regional Block:  Femoral nerve block  Pre-Anesthetic Checklist: ,, timeout performed, Correct Patient, Correct Site, Correct Laterality, Correct Procedure, Correct Position, site marked, Risks and benefits discussed,  Surgical consent,  Pre-op evaluation,  At surgeon's request and post-op pain management  Laterality: Lower and Right  Prep: chloraprep       Needles:  Injection technique: Single-shot  Needle Type: Echogenic Stimulator Needle          Additional Needles:  Procedures: ultrasound guided (picture in chart) and nerve stimulator Femoral nerve block  Nerve Stimulator or Paresthesia:  Response: quad, 0.6 mA,   Additional Responses:   Narrative:  Start time: 02/11/2014 5:29 PM End time: 02/11/2014 5:35 PM Injection made incrementally with aspirations every 5 mL.  Performed by: Personally  Anesthesiologist: Epifania Littrell  Additional Notes: H+P and labs reviewed, risks and  benefits discussed with patient, procedure tolerated well without complications

## 2014-02-11 NOTE — Progress Notes (Signed)
CHG bath done on arrival to [facility]. All jewelry removed and given to husband.

## 2014-02-11 NOTE — ED Provider Notes (Signed)
CSN: [id number]     Arrival date & time 02/11/14  1100 History   First MD Initiated Contact with Patient 02/11/14 1104     Chief Complaint  Patient presents with  . Fall     HPI Patient states she was getting up from the commode and transferred back to her walker today when her leg gave out on her and she presents with severe pain and deformity in her right ankle.  She also reports some pain in her right knee.  She denies hip pain.  No head injury.  No loss consciousness.  Denies neck pain.  She states was in her normal state of health prior to this.  Patient has oxygen dependent COPD and is on 3 and half liters at home.  She has a history of chronic pain for which he takes oxycodone and MS Contin.  She has not taken any of her morning pain meds.  No chest pain or shortness of breath.  No back pain.  Symptoms are moderate to severe in severity at this time   Past Medical History  Diagnosis Date  . COPD (chronic obstructive pulmonary disease)     nocturnal oxygen, plus daytime use with activity  . Fibromyalgia     Dx'd 1993.  This has impaired her significantly.  . IBS (irritable bowel syndrome)   . Edema   . Gallstones     asymptomatic per pt (as of 09/30/2013); mild chronic elevation of Alk phos (remainder of hepatic panel wnl).  . Hernia of abdominal wall   . Hypertension   . Hyperlipidemia   . On home oxygen therapy     "3.5L @ night and during day prn" (09/28/2013)  . History of pneumonia     "once"  . Adrenal benign tumor   . Type II diabetes mellitus     hx of good control per pt report + HbA1c <7% June 2015.  Marland Kitchen Anemia   . GERD (gastroesophageal reflux disease)   . History of duodenal ulcer   . Daily headache   . Migraine     "@ least monthly" (09/28/2013)  . TIA (transient ischemic attack) 2012; 2014    left facial and left arm numbness  . Degenerative disc disease     Cervical and lumbar  . Osteoarthritis (arthritis due to wear and tear of joints)     "back; knees;  elbows; left ankle" (09/28/2013)  . Chronic lower back pain     "L5-S1" (09/28/2013)  . Gout   . Anxiety   . Depression   . Breast cancer 2008    "right".  Lumpectomy + radiation+aromatase inhibitor (femara).  Regular f/u and mammos normal.  . Tremor     "upper extremities; last 6-7 months" (09/28/2013)  . Unspecified hypothyroidism 12/21/2013  . Multinodular goiter summer 2015    FNA 76/7/20 "Follicular lesion of undetermined significance": a follicular lesion/neoplasm can not be ruled out--Dr. Cruzita Lederer recommended repeat bx in 6-12 mo   Past Surgical History  Procedure Laterality Date  . Lumbar laminectomy  1991; 1996  . Ankle fracture surgery Left 08/2011    Bimalleolar fx  . Total abdominal hysterectomy w/ bilateral salpingoophorectomy  1990  . Breast biopsy Right 2008  . Breast lumpectomy Right 2008  . Cataract extraction w/ intraocular lens implant Right 08/2013  . Cardiac catheterization  2000's X 2  . Bladder tack     Family History  Problem Relation Age of Onset  . Arthritis Mother   . Breast  cancer Mother   . Hyperlipidemia Mother   . Stroke Mother   . Hypertension Mother   . Mental illness Mother   . Arthritis Father   . Cancer Father   . Hyperlipidemia Father   . Heart disease Father   . Heart disease Maternal Grandmother   . Hyperlipidemia Maternal Grandmother   . Heart disease Paternal Grandmother   . Hypertension Paternal Grandmother   . Diabetes Paternal Grandmother   . Arthritis Paternal Grandfather    History  Substance Use Topics  . Smoking status: Former Smoker -- 1.00 packs/day for 36 years    Types: Cigarettes    Quit date: 09/09/2013  . Smokeless tobacco: Never Used  . Alcohol Use: No   OB History   Grav Para Term Preterm Abortions TAB SAB Ect Mult Living                 Review of Systems  All other systems reviewed and are negative.     Allergies  Ciprofloxacin; Latex; Clindamycin/lincomycin; Fentanyl; Indocin; Keflex; Penicillins; and  Vancomycin  Home Medications   Prior to Admission medications   Medication Sig Start Date End Date Taking? Authorizing Provider  albuterol (PROVENTIL HFA;VENTOLIN HFA) 108 (90 BASE) MCG/ACT inhaler Inhale 2 puffs into the lungs every 6 (six) hours as needed for wheezing or shortness of breath. 10/10/13  Yes Domenic Polite, MD  albuterol (PROVENTIL) (2.5 MG/3ML) 0.083% nebulizer solution Take 3 mLs (2.5 mg total) by nebulization every 4 (four) hours as needed for wheezing or shortness of breath. 01/18/14  Yes Tammi Sou, MD  ALPRAZolam Duanne Moron) 0.5 MG tablet Take 1 tablet (0.5 mg total) by mouth daily as needed for anxiety or sleep. 10/27/13  Yes Tammi Sou, MD  aspirin EC 81 MG tablet Take 81 mg by mouth daily.   Yes Historical Provider, MD  atorvastatin (LIPITOR) 40 MG tablet Take 1 tablet (40 mg total) by mouth daily. 10/27/13  Yes Tammi Sou, MD  calcium gluconate 500 MG tablet Take 2 tablets by mouth daily.   Yes Historical Provider, MD  Cholecalciferol (VITAMIN D-3 PO) Take 1 tablet by mouth daily.   Yes Historical Provider, MD  citalopram (CELEXA) 40 MG tablet Take 40 mg by mouth daily.   Yes Historical Provider, MD  clopidogrel (PLAVIX) 75 MG tablet Take 75 mg by mouth daily.   Yes Historical Provider, MD  ferrous sulfate 325 (65 FE) MG tablet Take 325 mg by mouth daily with breakfast.   Yes Historical Provider, MD  furosemide (LASIX) 40 MG tablet Take 1 tablet (40 mg total) by mouth daily. 11/05/13  Yes Tammi Sou, MD  insulin aspart (NOVOLOG) 100 UNIT/ML injection Inject 6 Units into the skin 3 (three) times daily before meals. 10/10/13  Yes Domenic Polite, MD  Insulin Glargine (LANTUS) 100 UNIT/ML Solostar Pen Inject 40 Units into the skin 2 (two) times daily. 40 units BID 11/25/13  Yes Tammi Sou, MD  Insulin Pen Needle (BD PEN NEEDLE NANO U/F) 32G X 4 MM MISC Use pen needles as directed. 10/30/13  Yes Tammi Sou, MD  letrozole [place]) 2.5 MG tablet Take 1  tablet (2.5 mg total) by mouth daily. 11/30/13  Yes Tammi Sou, MD  levothyroxine (SYNTHROID, LEVOTHROID) 100 MCG tablet Take 1 tablet (100 mcg total) by mouth daily before breakfast. 02/09/14  Yes Tammi Sou, MD  lisinopril (PRINIVIL,ZESTRIL) 10 MG tablet Take 1 tablet (10 mg total) by mouth daily. 11/12/13  Yes  Tammi Sou, MD  Magnesium Oxide 500 MG (LAX) TABS Take 500 mg by mouth 2 (two) times daily.   Yes Historical Provider, MD  meclizine (ANTIVERT) 25 MG tablet Take 25 mg by mouth daily.    Yes Historical Provider, MD  morphine (MS CONTIN) 15 MG 12 hr tablet Take 15-30 mg by mouth 2 (two) times daily. 1 tab po qAM and 2 tabs po qhs 12/22/13  Yes Tammi Sou, MD  Multiple Vitamin (MULTIVITAMIN WITH MINERALS) TABS tablet Take 1 tablet by mouth daily.   Yes Historical Provider, MD  omeprazole (PRILOSEC) 40 MG capsule Take 1 capsule (40 mg total) by mouth daily. 11/23/13  Yes Tammi Sou, MD  potassium chloride SA (K-DUR,KLOR-CON) 20 MEQ tablet Take 20 mEq by mouth daily.   Yes Historical Provider, MD  pregabalin (LYRICA) 150 MG capsule Take 1 capsule (150 mg total) by mouth 2 (two) times daily. 12/01/13  Yes Tammi Sou, MD  rOPINIRole (REQUIP) 1 MG tablet Take 2 mg by mouth at bedtime. 2 tabs po qhs 11/11/13  Yes Tammi Sou, MD  tiotropium (SPIRIVA) 18 MCG inhalation capsule Place 18 mcg into inhaler and inhale daily.   Yes Historical Provider, MD   BP 143/74  Pulse 97  Temp(Src) 98.2 F (36.8 C) (Oral)  Resp 22  Ht 5' 4"  (1.626 m)  Wt 215 lb (97.523 kg)  BMI 36.89 kg/m2  SpO2 97% Physical Exam  Nursing note and vitals reviewed. Constitutional: She is oriented to person, place, and time. She appears well-developed and well-nourished. No distress.  HENT:  Head: Normocephalic and atraumatic.  Eyes: EOM are normal.  Neck: Normal range of motion.  Cardiovascular: Normal rate, regular rhythm and normal heart sounds.   Pulmonary/Chest: Effort normal and  breath sounds normal.  Abdominal: Soft. She exhibits no distension. There is no tenderness.  Musculoskeletal:  Full range of motion bilateral hips as well as left knee and left ankle.  Patient with some tenderness to the medial joint line of the right knee without obvious effusion.  Obvious deformity of the right ankle with intact DP and PT pulses.  Chronic bilateral lower extremity edema right greater than left.  Bruising noted on the medial malleolus  of the right ankle.  Neurological: She is alert and oriented to person, place, and time.  Skin: Skin is warm and dry.  Psychiatric: She has a normal mood and affect. Judgment normal.    ED Course  Procedures (including critical care time) Labs Review Labs Reviewed  CBC WITH DIFFERENTIAL - Abnormal; Notable for the following:    RBC 3.73 (*)    Hemoglobin 10.0 (*)    HCT 31.5 (*)    Neutrophils Relative % 84 (*)    Lymphocytes Relative 9 (*)    All other components within normal limits  PROTIME-INR  BASIC METABOLIC PANEL    Imaging Review Dg Ankle Complete Right  02/11/2014   CLINICAL DATA:  [place] today, swelling and pain.  EXAM: RIGHT ANKLE - COMPLETE 3+ VIEW  COMPARISON:  None.  FINDINGS: Skeletal osteopenia with marked soft tissue swelling. There is a trimalleolar fracture with disruption of the ankle mortise.  IMPRESSION: Trimalleolar fracture. Marked soft tissue swelling. Disruption of the ankle mortise.   Electronically Signed   By: Rolla Flatten M.D.   On: 02/11/2014 13:19   Dg Knee Complete 4 Views Right  02/11/2014   CLINICAL DATA:  [place], pain with soft tissue swelling  EXAM: RIGHT KNEE -  COMPLETE 4+ VIEW  COMPARISON:  None.  FINDINGS: There is no fracture or effusion. No dislocation is evident. There is mild soft tissue swelling. Osteopenia is noted.  IMPRESSION: Negative for fracture or effusion.   Electronically Signed   By: Rolla Flatten M.D.   On: 02/11/2014 13:23  I personally reviewed the imaging tests through PACS  system I reviewed available ER/hospitalization records through the EMR    EKG Interpretation   Date/Time:  Thursday February 11 2014 11:24:04 EDT Ventricular Rate:  98 PR Interval:    QRS Duration: 88 QT Interval:  343 QTC Calculation: 438 R Axis:   17 Text Interpretation:  Sinus rhythm Low voltage, precordial leads No  significant change was found Confirmed by Linzie Criss  MD, Lennette Bihari ([id number]) on  02/11/2014 11:56:40 AM      MDM   Final diagnoses:  Fall  Closed right trimalleolar fracture, initial encounter     Orthopedic: Dr. Claybon Jabs plan to take the operating room this evening.  Request medical admission given her medical comorbidities  Mechanical fall.  Right trimalar ankle fracture.  Patient be splinted in the emergency department.  Attempted to control pain at this time.  Medical admission.  Chest and abdomen benign.  C-spine cleared by Nexus criteria.  No head injury.   Hoy Morn, MD 02/11/14 [phone]

## 2014-02-11 NOTE — ED Notes (Signed)
Paged ortho tech for splint

## 2014-02-11 NOTE — Anesthesia Preprocedure Evaluation (Addendum)
Anesthesia Evaluation  Patient identified by MRN, date of birth, ID band Patient awake    Reviewed: Allergy & Precautions, H&P , NPO status , Patient's Chart, lab work & pertinent test results, reviewed documented beta blocker date and time   History of Anesthesia Complications Negative for: history of anesthetic complications  Airway Mallampati: III TM Distance: >3 FB Neck ROM: Full    Dental  (+) Poor Dentition   Pulmonary neg sleep apnea, COPD COPD inhaler and oxygen dependent, Recent URI , former smoker,  Copd exacerbation completed steroids ~2weeks ago, on continuous oxygen by nasal cannula   + wheezing      Cardiovascular hypertension, Pt. on medications +CHF Rhythm:Regular     Neuro/Psych  Headaches, Anxiety Depression TIA Neuromuscular disease    GI/Hepatic Neg liver ROS, GERD-  Medicated and Controlled,  Endo/Other  diabetes, Type 2, Insulin DependentHypothyroidism Morbid obesity  Renal/GU negative Renal ROS     Musculoskeletal  (+) Arthritis -, Osteoarthritis,  Fibromyalgia -, narcotic dependent  Abdominal   Peds  Hematology  (+) anemia ,   Anesthesia Other Findings   Reproductive/Obstetrics                          Anesthesia Physical Anesthesia Plan  ASA: IV  Anesthesia Plan: MAC and Regional   Post-op Pain Management:    Induction:   Airway Management Planned: Natural Airway and Simple Face Mask  Additional Equipment: None  Intra-op Plan:   Post-operative Plan:   Informed Consent: I have reviewed the patients History and Physical, chart, labs and discussed the procedure including the risks, benefits and alternatives for the proposed anesthesia with the patient or authorized representative who has indicated his/her understanding and acceptance.   Dental advisory given  Plan Discussed with: CRNA and Surgeon  Anesthesia Plan Comments:         Anesthesia Quick  Evaluation

## 2014-02-11 NOTE — Progress Notes (Signed)
Orthopedic Tech Progress Note Patient Details:  Brooke Mccullough 02/14/1946 [id number] Applied fiberglass posterior short leg splint to RLE.  Pulses, motion, sensation intact before and after application.  Capillary refill less than 2 seconds before and after application. Ortho Devices Type of Ortho Device: Post (short leg) splint Ortho Device/Splint Location: RLE Ortho Device/Splint Interventions: Application   Darrol Poke 02/11/2014, 3:13 PM

## 2014-02-11 NOTE — Transfer of Care (Signed)
Immediate Anesthesia Transfer of Care Note  Patient: Brooke Mccullough  Procedure(s) Performed: Procedure(s): OPEN REDUCTION INTERNAL FIXATION (ORIF) ANKLE FRACTURE (Right)  Patient Location: PACU  Anesthesia Type:MAC  Level of Consciousness: awake, alert  and oriented  Airway & Oxygen Therapy: Patient Spontanous Breathing and Patient connected to nasal cannula oxygen  Post-op Assessment: Report given to PACU RN, Post -op Vital signs reviewed and stable and Patient moving all extremities  Post vital signs: Reviewed and stable  Complications: No apparent anesthesia complications

## 2014-02-11 NOTE — ED Notes (Signed)
Back from xray

## 2014-02-11 NOTE — Consult Note (Signed)
Reason for Consult:r ankle fracture Referring Physician: hospitalists  Brooke Mccullough is an 68 y.o. female.  HPI: 68 yo female who fell earlier today and c/o ankle pain. Pt had l ankle fracture treated 2 years ago in [place]. Pt did well with that and now has moved to [facility]  Past Medical History  Diagnosis Date  . COPD (chronic obstructive pulmonary disease)     nocturnal oxygen, plus daytime use with activity  . Fibromyalgia     Dx'd 1993.  This has impaired her significantly.  . IBS (irritable bowel syndrome)   . Edema   . Gallstones     asymptomatic per pt (as of 09/30/2013); mild chronic elevation of Alk phos (remainder of hepatic panel wnl).  . Hernia of abdominal wall   . Hypertension   . Hyperlipidemia   . On home oxygen therapy     "3.5L @ night and during day prn" (09/28/2013)  . History of pneumonia     "once"  . Adrenal benign tumor   . Type II diabetes mellitus     hx of good control per pt report + HbA1c <7% June 2015.  Marland Kitchen Anemia   . GERD (gastroesophageal reflux disease)   . History of duodenal ulcer   . Daily headache   . Migraine     "@ least monthly" (09/28/2013)  . TIA (transient ischemic attack) 2012; 2014    left facial and left arm numbness  . Degenerative disc disease     Cervical and lumbar  . Osteoarthritis (arthritis due to wear and tear of joints)     "back; knees; elbows; left ankle" (09/28/2013)  . Chronic lower back pain     "L5-S1" (09/28/2013)  . Gout   . Anxiety   . Depression   . Breast cancer 2008    "right".  Lumpectomy + radiation+aromatase inhibitor (femara).  Regular f/u and mammos normal.  . Tremor     "upper extremities; last 6-7 months" (09/28/2013)  . Unspecified hypothyroidism 12/21/2013  . Multinodular goiter summer 2015    FNA 22/0/25 "Follicular lesion of undetermined significance": a follicular lesion/neoplasm can not be ruled out--Dr. Cruzita Lederer recommended repeat bx in 6-12 mo    Past Surgical History  Procedure  Laterality Date  . Lumbar laminectomy  1991; 1996  . Ankle fracture surgery Left 08/2011    Bimalleolar fx  . Total abdominal hysterectomy w/ bilateral salpingoophorectomy  1990  . Breast biopsy Right 2008  . Breast lumpectomy Right 2008  . Cataract extraction w/ intraocular lens implant Right 08/2013  . Cardiac catheterization  2000's X 2  . Bladder tack      Family History  Problem Relation Age of Onset  . Arthritis Mother   . Breast cancer Mother   . Hyperlipidemia Mother   . Stroke Mother   . Hypertension Mother   . Mental illness Mother   . Arthritis Father   . Cancer Father   . Hyperlipidemia Father   . Heart disease Father   . Heart disease Maternal Grandmother   . Hyperlipidemia Maternal Grandmother   . Heart disease Paternal Grandmother   . Hypertension Paternal Grandmother   . Diabetes Paternal Grandmother   . Arthritis Paternal Grandfather     Social History:  reports that she quit smoking about 5 months ago. Her smoking use included Cigarettes. She has a 36 pack-year smoking history. She has never used smokeless tobacco. She reports that she does not drink alcohol or use illicit drugs.  Allergies:  Allergies  Allergen Reactions  . Ciprofloxacin Other (See Comments)    Unspecified--noted in old PCP's records.  . Latex Other (See Comments)    Unspecified: noted in old PCP's records.  . Clindamycin/Lincomycin Rash  . Fentanyl Rash    Duragesic Patch  . Indocin [Indomethacin] Rash  . Keflex [Cephalexin] Rash  . Penicillins Rash  . Vancomycin Rash    Medications: I have reviewed the patient's current medications.  Results for orders placed during the hospital encounter of 02/11/14 (from the past 48 hour(s))  CBC WITH DIFFERENTIAL     Status: Abnormal   Collection Time    02/11/14 11:50 AM      Result Value Ref Range   WBC 8.5  4.0 - 10.5 K/uL   RBC 3.73 (*) 3.87 - 5.11 MIL/uL   Hemoglobin 10.0 (*) 12.0 - 15.0 g/dL   HCT 31.5 (*) 36.0 - 46.0 %   MCV  84.5  78.0 - 100.0 fL   MCH 26.8  26.0 - 34.0 pg   MCHC 31.7  30.0 - 36.0 g/dL   RDW 13.3  11.5 - 15.5 %   Platelets 94 (*) 150 - 400 K/uL   Comment: REPEATED TO VERIFY     PLATELET COUNT CONFIRMED BY SMEAR   Neutrophils Relative % 84 (*) 43 - 77 %   Neutro Abs 7.1  1.7 - 7.7 K/uL   Lymphocytes Relative 9 (*) 12 - 46 %   Lymphs Abs 0.8  0.7 - 4.0 K/uL   Monocytes Relative 5  3 - 12 %   Monocytes Absolute 0.5  0.1 - 1.0 K/uL   Eosinophils Relative 1  0 - 5 %   Eosinophils Absolute 0.1  0.0 - 0.7 K/uL   Basophils Relative 0  0 - 1 %   Basophils Absolute 0.0  0.0 - 0.1 K/uL  PROTIME-INR     Status: None   Collection Time    02/11/14 11:50 AM      Result Value Ref Range   Prothrombin Time 13.5  11.6 - 15.2 seconds   INR 1.02  0.00 - 5.80  BASIC METABOLIC PANEL     Status: Abnormal   Collection Time    02/11/14 11:50 AM      Result Value Ref Range   Sodium 142  137 - 147 mEq/L   Potassium 5.0  3.7 - 5.3 mEq/L   Chloride 99  96 - 112 mEq/L   CO2 34 (*) 19 - 32 mEq/L   Glucose, Bld 176 (*) 70 - 99 mg/dL   BUN 43 (*) 6 - 23 mg/dL   Creatinine, Ser 0.68  0.50 - 1.10 mg/dL   Calcium 9.5  8.4 - 10.5 mg/dL   GFR calc non Af Amer 87 (*) >90 mL/min   GFR calc Af Amer >90  >90 mL/min   Comment: (NOTE)     The eGFR has been calculated using the CKD EPI equation.     This calculation has not been validated in all clinical situations.     eGFR's persistently <90 mL/min signify possible Chronic Kidney     Disease.   Anion gap 9  5 - 15  CBG MONITORING, ED     Status: Abnormal   Collection Time    02/11/14  4:05 PM      Result Value Ref Range   Glucose-Capillary 169 (*) 70 - 99 mg/dL    Dg Ankle Complete Right  02/11/2014   CLINICAL DATA:  [place] today,  swelling and pain.  EXAM: RIGHT ANKLE - COMPLETE 3+ VIEW  COMPARISON:  None.  FINDINGS: Skeletal osteopenia with marked soft tissue swelling. There is a trimalleolar fracture with disruption of the ankle mortise.  IMPRESSION:  Trimalleolar fracture. Marked soft tissue swelling. Disruption of the ankle mortise.   Electronically Signed   By: Rolla Flatten M.D.   On: 02/11/2014 13:19   Dg Chest Portable 1 View  02/11/2014   CLINICAL DATA:  Preop for right ankle surgery.  EXAM: PORTABLE CHEST - 1 VIEW  COMPARISON:  October 07, 2013.  FINDINGS: Stable cardiomegaly. No pneumothorax or significant pleural effusion is noted. Left lung is clear. Mild right lower lobe opacity is noted most consistent with subsegmental atelectasis or possibly pneumonia. Elevated right hemidiaphragm is again noted. Bony thorax is intact.  IMPRESSION: Mild right lower lobe subsegmental atelectasis or pneumonia is noted. Followup radiographs are recommended.   Electronically Signed   By: Sabino Dick M.D.   On: 02/11/2014 15:45   Dg Knee Complete 4 Views Right  02/11/2014   CLINICAL DATA:  [place], pain with soft tissue swelling  EXAM: RIGHT KNEE - COMPLETE 4+ VIEW  COMPARISON:  None.  FINDINGS: There is no fracture or effusion. No dislocation is evident. There is mild soft tissue swelling. Osteopenia is noted.  IMPRESSION: Negative for fracture or effusion.   Electronically Signed   By: Rolla Flatten M.D.   On: 02/11/2014 13:23    ROS ROS: I have reviewed the patient's review of systems thoroughly and there are no positive responses as relates to the HPI. EXAM;  Blood pressure 153/54, pulse 87, temperature 98.2 F (36.8 C), temperature source Oral, resp. rate 26, height _0  (1.626 m), weight 215 lb (97.523 kg), SpO2 97.00%. Physical Exam Well-developed well-nourished patient in no acute distress. Alert and oriented x3 HEENT:within normal limits Cardiac: Regular rate and rhythm Pulmonary: Lungs clear to auscultation Abdomen: Soft and nontender.  Normal active bowel sounds  Musculoskeletal: R ankle: Marked STS and painful rom Assessment/Plan: 68 yo female with tri-malleolar ankle fracture.  Pt has significant medical comorbidity but the risk of no  surgery is significant as well.  Will plan ORIF with the understanding that her risks are significant.Pt has multiple allergies to ABx but has only rash to Ancef so we will give her 2 or 3 grams as appropriate to control infection risk. Will try to control blood sugars in peri-op and post-op settings as well.  Daniesha Driver L 02/11/2014, 4:56 PM

## 2014-02-11 NOTE — ED Notes (Signed)
Per ems, pt reports falling while on commode - pt states "my knees just gave out".  Pt arrives awake, alert, oriented, c/o right ankle pain.

## 2014-02-11 NOTE — Discharge Instructions (Signed)
Ambulate NON weight bearing on the right leg Elevate your right foot as much as possible.

## 2014-02-11 NOTE — H&P (Signed)
[facility] History and Physical  Brooke Mccullough FVC:[id number] DOB: [birth year] DOA: 02/11/2014  Referring physician: er PCP: Tammi Sou, MD   Chief Complaint: fall  HPI: Brooke Mccullough is a 68 y.o. female  With multiple medical issues.  She fell today and had pain in her right ankle.  She usually walks with a walker.  Not very active at home.  Patient did not lose consciousness.  No bowel or bladder incontinence.  She wears 3.5L O2 at home.  She follows with a pain clinic for her chronic pain.  Her PCP took her off lasix BID to lasix daily about 1 month ago and patient had had gradual weight increase- 9lbs in 10 days  In the ER, she was found to have a right trimalar ankle fracture which was placed in a splint.  Surgery is planned for later tonight.    Review of Systems:  All systems reviewed, negative unless stated above  Past Medical History  Diagnosis Date  . COPD (chronic obstructive pulmonary disease)     nocturnal oxygen, plus daytime use with activity  . Fibromyalgia     Dx'd 1993.  This has impaired her significantly.  . IBS (irritable bowel syndrome)   . Edema   . Gallstones     asymptomatic per pt (as of 09/30/2013); mild chronic elevation of Alk phos (remainder of hepatic panel wnl).  . Hernia of abdominal wall   . Hypertension   . Hyperlipidemia   . On home oxygen therapy     "3.5L @ night and during day prn" (09/28/2013)  . History of pneumonia     "once"  . Adrenal benign tumor   . Type II diabetes mellitus     hx of good control per pt report + HbA1c <7% June 2015.  Marland Kitchen Anemia   . GERD (gastroesophageal reflux disease)   . History of duodenal ulcer   . Daily headache   . Migraine     "@ least monthly" (09/28/2013)  . TIA (transient ischemic attack) 2012; 2014    left facial and left arm numbness  . Degenerative disc disease     Cervical and lumbar  . Osteoarthritis (arthritis due to wear and tear of joints)     "back; knees; elbows; left  ankle" (09/28/2013)  . Chronic lower back pain     "L5-S1" (09/28/2013)  . Gout   . Anxiety   . Depression   . Breast cancer 2008    "right".  Lumpectomy + radiation+aromatase inhibitor (femara).  Regular f/u and mammos normal.  . Tremor     "upper extremities; last 6-7 months" (09/28/2013)  . Unspecified hypothyroidism 12/21/2013  . Multinodular goiter summer 2015    FNA 63/8/46 "Follicular lesion of undetermined significance": a follicular lesion/neoplasm can not be ruled out--Dr. Cruzita Lederer recommended repeat bx in 6-12 mo   Past Surgical History  Procedure Laterality Date  . Lumbar laminectomy  1991; 1996  . Ankle fracture surgery Left 08/2011    Bimalleolar fx  . Total abdominal hysterectomy w/ bilateral salpingoophorectomy  1990  . Breast biopsy Right 2008  . Breast lumpectomy Right 2008  . Cataract extraction w/ intraocular lens implant Right 08/2013  . Cardiac catheterization  2000's X 2  . Bladder tack     Social History:  reports that she quit smoking about 5 months ago. Her smoking use included Cigarettes. She has a 36 pack-year smoking history. She has never used smokeless tobacco. She reports that she does not drink alcohol  or use illicit drugs.  Allergies  Allergen Reactions  . Ciprofloxacin Other (See Comments)    Unspecified--noted in old PCP's records.  . Latex Other (See Comments)    Unspecified: noted in old PCP's records.  . Clindamycin/Lincomycin Rash  . Fentanyl Rash    Duragesic Patch  . Indocin [Indomethacin] Rash  . Keflex [Cephalexin] Rash  . Penicillins Rash  . Vancomycin Rash    Family History  Problem Relation Age of Onset  . Arthritis Mother   . Breast cancer Mother   . Hyperlipidemia Mother   . Stroke Mother   . Hypertension Mother   . Mental illness Mother   . Arthritis Father   . Cancer Father   . Hyperlipidemia Father   . Heart disease Father   . Heart disease Maternal Grandmother   . Hyperlipidemia Maternal Grandmother   . Heart disease  Paternal Grandmother   . Hypertension Paternal Grandmother   . Diabetes Paternal Grandmother   . Arthritis Paternal Grandfather      Prior to Admission medications   Medication Sig Start Date End Date Taking? Authorizing Provider  albuterol (PROVENTIL HFA;VENTOLIN HFA) 108 (90 BASE) MCG/ACT inhaler Inhale 2 puffs into the lungs every 6 (six) hours as needed for wheezing or shortness of breath. 10/10/13  Yes Domenic Polite, MD  albuterol (PROVENTIL) (2.5 MG/3ML) 0.083% nebulizer solution Take 3 mLs (2.5 mg total) by nebulization every 4 (four) hours as needed for wheezing or shortness of breath. 01/18/14  Yes Tammi Sou, MD  ALPRAZolam Duanne Moron) 0.5 MG tablet Take 1 tablet (0.5 mg total) by mouth daily as needed for anxiety or sleep. 10/27/13  Yes Tammi Sou, MD  aspirin EC 81 MG tablet Take 81 mg by mouth daily.   Yes Historical Provider, MD  atorvastatin (LIPITOR) 40 MG tablet Take 1 tablet (40 mg total) by mouth daily. 10/27/13  Yes Tammi Sou, MD  calcium gluconate 500 MG tablet Take 2 tablets by mouth daily.   Yes Historical Provider, MD  Cholecalciferol (VITAMIN D-3 PO) Take 1 tablet by mouth daily.   Yes Historical Provider, MD  citalopram (CELEXA) 40 MG tablet Take 40 mg by mouth daily.   Yes Historical Provider, MD  clopidogrel (PLAVIX) 75 MG tablet Take 75 mg by mouth daily.   Yes Historical Provider, MD  ferrous sulfate 325 (65 FE) MG tablet Take 325 mg by mouth daily with breakfast.   Yes Historical Provider, MD  furosemide (LASIX) 40 MG tablet Take 1 tablet (40 mg total) by mouth daily. 11/05/13  Yes Tammi Sou, MD  insulin aspart (NOVOLOG) 100 UNIT/ML injection Inject 6 Units into the skin 3 (three) times daily before meals. 10/10/13  Yes Domenic Polite, MD  Insulin Glargine (LANTUS) 100 UNIT/ML Solostar Pen Inject 40 Units into the skin 2 (two) times daily. 40 units BID 11/25/13  Yes Tammi Sou, MD  Insulin Pen Needle (BD PEN NEEDLE NANO U/F) 32G X 4 MM MISC Use  pen needles as directed. 10/30/13  Yes Tammi Sou, MD  letrozole [facility]) 2.5 MG tablet Take 1 tablet (2.5 mg total) by mouth daily. 11/30/13  Yes Tammi Sou, MD  levothyroxine (SYNTHROID, LEVOTHROID) 100 MCG tablet Take 1 tablet (100 mcg total) by mouth daily before breakfast. 02/09/14  Yes Tammi Sou, MD  lisinopril (PRINIVIL,ZESTRIL) 10 MG tablet Take 1 tablet (10 mg total) by mouth daily. 11/12/13  Yes Tammi Sou, MD  Magnesium Oxide 500 MG (LAX) TABS Take  500 mg by mouth 2 (two) times daily.   Yes Historical Provider, MD  meclizine (ANTIVERT) 25 MG tablet Take 25 mg by mouth daily.    Yes Historical Provider, MD  morphine (MS CONTIN) 15 MG 12 hr tablet Take 15-30 mg by mouth 2 (two) times daily. 1 tab po qAM and 2 tabs po qhs 12/22/13  Yes Tammi Sou, MD  Multiple Vitamin (MULTIVITAMIN WITH MINERALS) TABS tablet Take 1 tablet by mouth daily.   Yes Historical Provider, MD  omeprazole (PRILOSEC) 40 MG capsule Take 1 capsule (40 mg total) by mouth daily. 11/23/13  Yes Tammi Sou, MD  potassium chloride SA (K-DUR,KLOR-CON) 20 MEQ tablet Take 20 mEq by mouth daily.   Yes Historical Provider, MD  pregabalin (LYRICA) 150 MG capsule Take 1 capsule (150 mg total) by mouth 2 (two) times daily. 12/01/13  Yes Tammi Sou, MD  rOPINIRole (REQUIP) 1 MG tablet Take 2 mg by mouth at bedtime. 2 tabs po qhs 11/11/13  Yes Tammi Sou, MD  tiotropium (SPIRIVA) 18 MCG inhalation capsule Place 18 mcg into inhaler and inhale daily.   Yes Historical Provider, MD   Physical Exam: Filed Vitals:   02/11/14 1130 02/11/14 1215 02/11/14 1315 02/11/14 1445  BP: 155/49 143/74 158/48 153/54  Pulse:    87  Temp:      TempSrc:      Resp: 20 22 18 26   Height:      Weight:      SpO2:    97%    Wt Readings from Last 3 Encounters:  02/11/14 97.523 kg (215 lb)  02/11/14 97.523 kg (215 lb)  01/18/14 84.823 kg (187 lb)    General:  Appears calm and comfortable- on home 3.5 L Eyes:  PERRL, normal lids, irises & conjunctiva ENT: grossly normal hearing, lips & tongue Neck: no LAD, masses or thyromegaly Cardiovascular: RRR, no m/r/g. No LE edema. Telemetry: SR, no arrhythmias  Respiratory: no wheezing Abdomen: soft, ntnd Skin: no rash or induration seen on limited exam Musculoskeletal: right ankle in splint Psychiatric: grossly normal mood and affect, speech fluent and appropriate Neurologic: grossly non-focal.          Labs on Admission:  Basic Metabolic Panel:  Recent Labs Lab 02/11/14 1150  NA 142  K 5.0  CL 99  CO2 34*  GLUCOSE 176*  BUN 43*  CREATININE 0.68  CALCIUM 9.5   Liver Function Tests: No results found for this basename: AST, ALT, ALKPHOS, BILITOT, PROT, ALBUMIN,  in the last 168 hours No results found for this basename: LIPASE, AMYLASE,  in the last 168 hours No results found for this basename: AMMONIA,  in the last 168 hours CBC:  Recent Labs Lab 02/11/14 1150  WBC 8.5  NEUTROABS 7.1  HGB 10.0*  HCT 31.5*  MCV 84.5  PLT PENDING   Cardiac Enzymes: No results found for this basename: CKTOTAL, CKMB, CKMBINDEX, TROPONINI,  in the last 168 hours  BNP (last 3 results)  Recent Labs  09/28/13 1304 10/07/13 1908  PROBNP 356.8* 919.7*   CBG: No results found for this basename: GLUCAP,  in the last 168 hours  Radiological Exams on Admission: Dg Ankle Complete Right  02/11/2014   CLINICAL DATA:  [place] today, swelling and pain.  EXAM: RIGHT ANKLE - COMPLETE 3+ VIEW  COMPARISON:  None.  FINDINGS: Skeletal osteopenia with marked soft tissue swelling. There is a trimalleolar fracture with disruption of the ankle mortise.  IMPRESSION: Trimalleolar fracture.  Marked soft tissue swelling. Disruption of the ankle mortise.   Electronically Signed   By: Rolla Flatten M.D.   On: 02/11/2014 13:19   Dg Knee Complete 4 Views Right  02/11/2014   CLINICAL DATA:  [place], pain with soft tissue swelling  EXAM: RIGHT KNEE - COMPLETE 4+ VIEW  COMPARISON:   None.  FINDINGS: There is no fracture or effusion. No dislocation is evident. There is mild soft tissue swelling. Osteopenia is noted.  IMPRESSION: Negative for fracture or effusion.   Electronically Signed   By: Rolla Flatten M.D.   On: 02/11/2014 13:23    EKG: Independently reviewed. NSR  Assessment/Plan Active Problems:   Closed right trimalleolar fracture  Closed right trimalleolar fracture- for surgery today by Dr. Berenice Primas -recent echo 2015 with no wall abnormalities  -2 years ago, had surgery on left ankle with no issues -no recent heart catherization, patient denies chest pain, not very active at home  COPD- stable- on 3.5L at home continuously, nebs PRN-  -history of hypercarbia so will need to closely monitor respiratory status  Morbid obesity  Diastolic heart failure- may need increase in lasix dose- recently decreased with recent weight gain of 9 lb since Oct 10th  DM- SSI, continue lantus and novolog  Anemia- monitor   Ortho- [place]  Code Status: full DVT Prophylaxis: Family Communication: patient and husband  Time spent: 93 min Eulogio Bear [facility] Pager [phone]

## 2014-02-11 NOTE — Anesthesia Postprocedure Evaluation (Signed)
  Anesthesia Post-op Note  Patient: Brooke Mccullough  Procedure(s) Performed: Procedure(s): OPEN REDUCTION INTERNAL FIXATION (ORIF) ANKLE FRACTURE (Right)  Patient Location: PACU  Anesthesia Type:Regional  Level of Consciousness: awake, alert , oriented and patient cooperative  Airway and Oxygen Therapy: Patient Spontanous Breathing and Patient connected to nasal cannula oxygen  Post-op Pain: none  Post-op Assessment: Post-op Vital signs reviewed, Patient's Cardiovascular Status Stable, Respiratory Function Stable, Patent Airway, No signs of Nausea or vomiting and Pain level controlled  Post-op Vital Signs: Reviewed and stable  Last Vitals:  Filed Vitals:   02/11/14 1955  BP: 158/51  Pulse: 84  Temp: 36.7 C  Resp: 18    Complications: No apparent anesthesia complications

## 2014-02-11 NOTE — Brief Op Note (Signed)
02/11/2014  6:44 PM  PATIENT:  [name]  68 y.o. female  PRE-OPERATIVE DIAGNOSIS:  ankle fx  POST-OPERATIVE DIAGNOSIS:  ankle fx  PROCEDURE:  Procedure(s): OPEN REDUCTION INTERNAL FIXATION (ORIF) ANKLE FRACTURE (Right)  SURGEON:  Surgeon(s) and Role:    * Alta Corning, MD - Primary  PHYSICIAN ASSISTANT:   ASSISTANTS: bethune   ANESTHESIA:   general  EBL:  Total I/O In: 1000 [I.V.:1000] Out: -   BLOOD ADMINISTERED:none  DRAINS: none   LOCAL MEDICATIONS USED:   NONE  SPECIMEN:  No Specimen  DISPOSITION OF SPECIMEN:  N/A  COUNTS:  YES  TOURNIQUET:  * Missing tourniquet times found for documented tourniquets in log:  [id number] *  DICTATION: .Other Dictation: Dictation Number [id number]  PLAN OF CARE: Admit to inpatient   PATIENT DISPOSITION:  PACU - hemodynamically stable.   Delay start of Pharmacological VTE agent (>24hrs) due to surgical blood loss or risk of bleeding: no

## 2014-02-12 ENCOUNTER — Encounter (HOSPITAL_COMMUNITY): Payer: Self-pay | Admitting: Orthopedic Surgery

## 2014-02-12 DIAGNOSIS — S82851D Displaced trimalleolar fracture of right lower leg, subsequent encounter for closed fracture with routine healing: Secondary | ICD-10-CM

## 2014-02-12 DIAGNOSIS — J449 Chronic obstructive pulmonary disease, unspecified: Secondary | ICD-10-CM

## 2014-02-12 LAB — BASIC METABOLIC PANEL
Anion gap: 11 (ref 5–15)
BUN: 27 mg/dL — AB (ref 6–23)
CALCIUM: 9.2 mg/dL (ref 8.4–10.5)
CO2: 33 mEq/L — ABNORMAL HIGH (ref 19–32)
CREATININE: 0.65 mg/dL (ref 0.50–1.10)
Chloride: 102 mEq/L (ref 96–112)
GFR, EST NON AFRICAN AMERICAN: 89 mL/min — AB (ref >90)
GLUCOSE: 197 mg/dL — AB (ref 70–99)
Potassium: 4.6 mEq/L (ref 3.7–5.3)
Sodium: 146 mEq/L (ref 137–147)

## 2014-02-12 LAB — CBC
HCT: 28.2 % — ABNORMAL LOW (ref 36.0–46.0)
Hemoglobin: 8.7 g/dL — ABNORMAL LOW (ref 12.0–15.0)
MCH: 26.1 pg (ref 26.0–34.0)
MCHC: 30.9 g/dL (ref 30.0–36.0)
MCV: 84.7 fL (ref 78.0–100.0)
PLATELETS: 87 10*3/uL — AB (ref 150–400)
RBC: 3.33 MIL/uL — ABNORMAL LOW (ref 3.87–5.11)
RDW: 13.2 % (ref 11.5–15.5)
WBC: 5.9 10*3/uL (ref 4.0–10.5)

## 2014-02-12 LAB — GLUCOSE, CAPILLARY
GLUCOSE-CAPILLARY: 233 mg/dL — AB (ref 70–99)
GLUCOSE-CAPILLARY: 137 mg/dL — AB (ref 70–99)
Glucose-Capillary: 181 mg/dL — ABNORMAL HIGH (ref 70–99)
Glucose-Capillary: 274 mg/dL — ABNORMAL HIGH (ref 70–99)

## 2014-02-12 MED ORDER — OXYCODONE-ACETAMINOPHEN 5-325 MG PO TABS: 1.0000 | ORAL_TABLET | Freq: Four times a day (QID) | ORAL | Status: DC | PRN

## 2014-02-12 NOTE — Progress Notes (Signed)
Subjective: 1 Day Post-Op Procedure(s) (LRB): OPEN REDUCTION INTERNAL FIXATION (ORIF) ANKLE FRACTURE (Right) Patient reports pain as moderate. She was up to chair with physical therapy. Needed significant assistance  Objective: Vital signs in last 24 hours: Temp:  [97.5 F (36.4 C)-99.9 F (37.7 C)] 98.6 F (37 C) (10/23 1453) Pulse Rate:  [84-95] 90 (10/23 1453) Resp:  [17-22] 18 (10/23 1453) BP: (131-165)/(45-56) 148/52 mmHg (10/23 1453) SpO2:  [93 %-99 %] 98 % (10/23 1453)  Intake/Output from previous day: 10/22 0701 - 10/23 0700 In: 9518 [P.O.:120; I.V.:1375; IV Piggyback:100] Out: 1025 [Urine:1025] Intake/Output this shift: Total I/O In: 290 [P.O.:240; IV Piggyback:50] Out: 400 [Urine:400]   Recent Labs  02/11/14 1150 02/12/14 0540  HGB 10.0* 8.7*    Recent Labs  02/11/14 1150 02/12/14 0540  WBC 8.5 5.9  RBC 3.73* 3.33*  HCT 31.5* 28.2*  PLT 94* 87*    Recent Labs  02/11/14 1150 02/12/14 0540  NA 142 146  K 5.0 4.6  CL 99 102  CO2 34* 33*  BUN 43* 27*  CREATININE 0.68 0.65  GLUCOSE 176* 197*  CALCIUM 9.5 9.2    Recent Labs  02/11/14 1150  INR 1.02   Right ankle exam: Posterior splint intact. Moves toes actively. Good capillary refill in all toes. Dressing/splint clean and dry   Assessment/Plan: 1 Day Post-Op Procedure(s) (LRB): OPEN REDUCTION INTERNAL FIXATION (ORIF) ANKLE FRACTURE (Right)  Plan: Up with physical therapy with walker nonweightbearing on right. Continue to elevate right leg as much as possible. Okay from orthopedic viewpoint to discharge when patient passes physical therapy. If she does not pass physical therapy she may need skilled nursing facility at her current facility in [place]. Dr. Romero Liner PA-C are on call over weekend. She'll need an Rx for Percocet (already printed) for pain. Will need followup with Dr. Berenice Primas in 2 weeks   [place] 02/12/2014, 4:24 PM

## 2014-02-12 NOTE — Progress Notes (Signed)
PROGRESS NOTE  Brooke Mccullough CBS:[id number] DOB: 08-28-1945 DOA: 02/11/2014 PCP: Tammi Sou, MD  HPI: Brooke Mccullough is a 68 y.o. female with multiple medical issues. She fell today and had pain in her right ankle. She usually walks with a walker. Not very active at home. Patient did not lose consciousness. No bowel or bladder incontinence. She wears 3.5L O2 at home. She follows with a pain clinic for her chronic pain. Her PCP took her off lasix BID to lasix daily about 1 month ago and patient had had gradual weight increase- 9lbs in 10 days  Subjective/ 24 H Interval events - complains of back pain this morning  Assessment/Plan: Closed right trimalleolar fracture - s/p ORIF 10/22  - PT to see today - DVT prophylaxis per ortho - recent echo 2015 with no wall abnormalities  - 2 years ago, had surgery on left ankle with no issues  COPD - stable- on 3.5L at home continuously, nebs PRN - history of hypercarbia so will need to closely monitor respiratory status  Morbid obesity  Diastolic heart failure - may need increase in lasix dose- recently decreased with recent weight gain of 9 lb since Oct 10th  DM - SSI, continue lantus and novolog  - Last A1C shows good control CKD stage III - Cr within normal limits today  Thrombocytopenia - for the past 6 months, unclear etiology, stable, no active bleeding, closely monitor.  ABLA post op - monitor, Hb 8.7, no indication for transfusion today    Diet: carb modified Fluids: none  DVT Prophylaxis: Lovenox  Code Status: Full Family Communication: none  Disposition Plan: inpatient, PT evaluation pending  Consultants:  Orthopedic surgery   Procedures:  ORIF R ankle 10/22   Antibiotics  Anti-infectives   Start     Dose/Rate Route Frequency Ordered Stop   02/11/14 2200  clindamycin (CLEOCIN) IVPB 600 mg     600 mg 100 mL/hr over 30 Minutes Intravenous 3 times per day 02/11/14 2015 02/12/14 2159     Studies Filed  Vitals:   02/11/14 2037 02/12/14 0030 02/12/14 0519 02/12/14 0835  BP: 145/48 131/45 158/47   Pulse: 88 85 93   Temp: 99.1 F (37.3 C) 99.9 F (37.7 C) 97.8 F (36.6 C)   TempSrc: Oral Oral Oral   Resp: 17 18 18    Height:      Weight:      SpO2: 96% 93% 94% 99%    Intake/Output Summary (Last 24 hours) at 02/12/14 1439 Last data filed at 02/12/14 0900  Gross per 24 hour  Intake   1715 ml  Output   1425 ml  Net    290 ml   Filed Weights   02/11/14 1111  Weight: 97.523 kg (215 lb)   Exam:  General:  NAD  Cardiovascular: RRR  Respiratory: CTA biL  Abdomen: soft, non tender  MSK: no edema  Neuro: non focal  Data Reviewed: Basic Metabolic Panel:  Recent Labs Lab 02/11/14 1150 02/12/14 0540  NA 142 146  K 5.0 4.6  CL 99 102  CO2 34* 33*  GLUCOSE 176* 197*  BUN 43* 27*  CREATININE 0.68 0.65  CALCIUM 9.5 9.2   CBC:  Recent Labs Lab 02/11/14 1150 02/12/14 0540  WBC 8.5 5.9  NEUTROABS 7.1  --   HGB 10.0* 8.7*  HCT 31.5* 28.2*  MCV 84.5 84.7  PLT 94* 87*   BNP (last 3 results)  Recent Labs  09/28/13 1304 10/07/13 1908  PROBNP 356.8*  919.7*   CBG:  Recent Labs Lab February 21, 2014 1605 2014-02-21 2153 02/12/14 0634 02/12/14 1217  GLUCAP 169* 160* 181* 274*   Studies: Dg Ankle Complete Right 2014-02-21 Trimalleolar fracture with hardware fixation of the medial and lateral malleoli, as described above.  Dg Ankle Complete Right 2014-02-21 Trimalleolar fracture. Marked soft tissue swelling. Disruption of the ankle mortise.    Dg Chest Portable 1 View 2014/02/21 Mild right lower lobe subsegmental atelectasis or pneumonia is noted. Followup radiographs are recommended.  Dg Knee Complete 4 Views Right 02-21-14 Negative for fracture or effusion.   Electronically Signed   By: Rolla Flatten M.D.   On: 2014/02/21 13:23   Dg C-arm 1-60 Min 02/21/2014 Trimalleolar fracture with hardware fixation of the medial and lateral malleoli, as described above.     Scheduled Meds: . albuterol  2.5 mg Nebulization Q6H  . aspirin EC  81 mg Oral Daily  . atorvastatin  40 mg Oral Daily  . calcium gluconate  2 tablet Oral Daily  . citalopram  40 mg Oral Daily  . clindamycin (CLEOCIN) IV  600 mg Intravenous 3 times per day  . clopidogrel  75 mg Oral Daily  . enoxaparin (LOVENOX) injection  40 mg Subcutaneous Q24H  . furosemide  40 mg Oral Daily  . insulin aspart  0-15 Units Subcutaneous TID WC  . insulin aspart  0-5 Units Subcutaneous QHS  . insulin aspart  6 Units Subcutaneous TID AC  . insulin glargine  40 Units Subcutaneous BID  . levothyroxine  100 mcg Oral QAC breakfast  . lisinopril  10 mg Oral Daily  . magnesium oxide  400 mg Oral BID  . meclizine  25 mg Oral Daily  . morphine  15 mg Oral q morning - 10a   And  . morphine  30 mg Oral QHS  . multivitamin with minerals  1 tablet Oral Daily  . pantoprazole  80 mg Oral Daily  . potassium chloride SA  20 mEq Oral Daily  . pregabalin  150 mg Oral BID  . rOPINIRole  2 mg Oral QHS  . sodium chloride  3 mL Intravenous Q12H  . tiotropium  18 mcg Inhalation Daily   Continuous Infusions: . lactated ringers 75 mL/hr at 2014-02-21 2135    Active Problems:   Diabetes   Chronic pain syndrome   Multinodular goiter (nontoxic)   Chronic renal insufficiency, stage III (moderate)   Hypothyroidism   HTN (hypertension), benign   Closed right trimalleolar fracture   COPD (chronic obstructive pulmonary disease)   Time spent: [place], MD [facility] Pager [phone]. If 7 PM - 7 AM, please contact night-coverage at [email or web address], password [facility] 02/12/2014, 2:39 PM  LOS: 1 day

## 2014-02-12 NOTE — Progress Notes (Signed)
Nutrition Brief Note  Patient identified on the Malnutrition Screening Tool (MST) Report  Wt Readings from Last 15 Encounters:  02/11/14 215 lb (97.523 kg)  02/11/14 215 lb (97.523 kg)  01/18/14 187 lb (84.823 kg)  12/21/13 206 lb 12.8 oz (93.804 kg)  11/12/13 202 lb (91.627 kg)  10/14/13 208 lb (94.348 kg)  10/10/13 203 lb 7.8 oz (92.3 kg)  09/30/13 208 lb (94.348 kg)  09/28/13 221 lb 3.2 oz (100.336 kg)    Body mass index is 36.89 kg/(m^2). Patient meets criteria for class II obesity based on current BMI. Pt's weight has been stable. Pt reports eating 3 full meals a day along with protein bars in between meals PTA.  Current diet order is carbohydrate modified, patient is consuming approximately 75% of meals at this time and also consuming snacks in between meals. Labs and medications reviewed.   No nutrition interventions warranted at this time. If nutrition issues arise, please consult RD.   Kallie Locks, MS, RD, LDN Pager # [phone] After hours/ weekend pager # [phone]

## 2014-02-12 NOTE — Op Note (Signed)
NAME:  Brooke Mccullough, Brooke Mccullough NO.:  [id number]  MEDICAL RECORD NO.:  [id number]  LOCATION:  5N02C                        FACILITY:  [facility]  PHYSICIAN:  Alta Corning, M.D.   DATE OF BIRTH:  Feb 06, 1946  DATE OF PROCEDURE:  02/11/2014 DATE OF DISCHARGE:                              OPERATIVE REPORT   PREOPERATIVE DIAGNOSES:  Trimalleolar ankle fracture, right, in the setting of marked obesity, lymphedema, and diabetes.  POSTOPERATIVE DIAGNOSES:  Trimalleolar ankle fracture, right, in the setting of marked obesity, lymphedema, and diabetes.  PROCEDURE:  Open reduction and internal fixation of trimalleolar ankle fracture with open reduction laterally with a seven-hole plate and interfragmentary screw and percutaneous medial side fixation with cannulated screws.  SURGEON:  Alta Corning, M.D.  ASSISTANT:  Gary Fleet, P.A.  ANESTHESIA:  General.  BRIEF HISTORY:  [name] is a 68 year old female with a history of having a fall.  She suffered a trimalleolar ankle fracture.  She has significant osteoporosis.  We evaluated her and felt that she needed open reduction and internal fixation given the displacement of the fracture.  She was brought to the operating room for this procedure.  DESCRIPTION OF PROCEDURE:  The patient was brought to the operating room.  After adequate anesthesia was obtained with general anesthetic, the patient was placed supine on the operating table.  Right leg was prepped and draped in usual sterile fashion.  Following this, the leg was exsanguinated.  Blood pressure tourniquet was inflated to 300 mmHg. Following this, an incision was made to the lateral side.  The fracture fragment was identified __________ elements have been mechanically reduced and then interfragmentary screw fixation was placed followed by plate fixation with a seven-hole plate.  We actually got all 7 holes filled, 3 cancellous distally and 4 cortical proximally.   Once this was done, we took a look at her and because of her large size and comorbid conditions, I felt that even though it might be not perfectly anatomically reduced, the percutaneous fixation might make sense on the medial side.  We were able to pass 2 guidewires through the medial malleolus and excellent fixation and got looked at these under fluoro with both AP and lateral, felt that they were adequately placed and they were measured and screws were placed at this point.  A near anatomic fixation was achieved, both medial and lateral at this time.  Wounds were irrigated, suctioned, dried, closed in layers.  Sterile compressive dressing was applied as well as the U and a posterior splint that came out all the way over the toes to help give some correction in the posterior malleolus, and the posterior malleolus looked adequately reduced prior to leaving the operating room.  The wounds were closed as outlined, and the patient was taken to the recovery room where she was noted to be in satisfactory condition.  Estimated blood loss for the procedure was none.     Alta Corning, M.D.     Corliss Skains  D:  02/11/2014  T:  02/11/2014  Job:  [id number]

## 2014-02-12 NOTE — Progress Notes (Signed)
Utilization review completed.  

## 2014-02-12 NOTE — Evaluation (Addendum)
Physical Therapy Evaluation Patient Details Name: Brooke Mccullough MRN: [id number] DOB: October 24, 1945 Today's Date: 02/12/2014   History of Present Illness  68 y.o. female with h/o L ankle fx, COPD on O2, DM, gout, IBS, back pain, fibromyalgia, CHF admitted with R ankle fx, s/p ORIF 02/11/14.   Clinical Impression  *Pt admitted with R ankle fx, s/p ORIF**. Pt currently with functional limitations due to the deficits listed below (see PT Problem List).  Pt will benefit from skilled PT to increase their independence and safety with mobility to allow discharge to the venue listed below.   +2 assist for bed to recliner transfer, pt had trouble maintaining NWB status RLE. ST-SNF recommended.    Also, pt was noted to be tremulous during PT eval. She stated this started 3 days ago. **    Follow Up Recommendations SNF (pt would like to go to rehab at her retirement facility, but they won't have a bed until Tuesday per pt's son)    Equipment Recommendations  None recommended by PT    Recommendations for Other Services OT consult     Precautions / Restrictions Precautions Precautions: Fall Precaution Comments: h/o fall  2 years ago during which she sustained a L ankle fx, one other fall 4 months ago related to overmedication Restrictions Weight Bearing Restrictions: Yes RLE Weight Bearing: Non weight bearing      Mobility  Bed Mobility Overal bed mobility: Needs Assistance Bed Mobility: Supine to Sit     Supine to sit: HOB elevated;Mod assist     General bed mobility comments: assist to support RLE and to raise trunk  Transfers Overall transfer level: Needs assistance Equipment used: Rolling walker (2 wheeled) Transfers: Sit to/from [employer] Transfers Sit to Stand: +2 physical assistance;Max assist Stand pivot transfers: +2 physical assistance;Max assist       General transfer comment: pt stood with RW but was unable to pivot on LLE to recliner, then performed side  by side stand pivot transfer with +2 assist, difficulty maintaining NWB status RLE  Ambulation/Gait                Stairs            Wheelchair Mobility    Modified Rankin (Stroke Patients Only)       Balance Overall balance assessment: Needs assistance Sitting-balance support: Bilateral upper extremity supported Sitting balance-Leahy Scale: Fair Sitting balance - Comments: leans posteriorly intermittently with no UE support Postural control: Posterior lean   Standing balance-Leahy Scale: Poor Standing balance comment: requires BUE support                             Pertinent Vitals/Pain Pain Assessment: 0-10 Pain Score: 7  Pain Location: R ankle Pain Intervention(s): Patient requesting pain meds-RN notified    Home Living Family/patient expects to be discharged to:: Private residence Living Arrangements: Spouse/significant other Available Help at Discharge: Family;Available 24 hours/day   Home Access: Stairs to enter     Home Layout: One level Home Equipment: Walker - 2 wheels;Grab bars - toilet;Grab bars - tub/shower;Shower seat - built in;Wheelchair - manual Additional Comments: lives at [facility] with husband (Independent Level)    Prior Function Level of Independence: Independent with assistive device(s)         Comments: pt has walk in shower with built in seat and handicap bars around toilet and shower with elevated toilet; walked in apartment with RW independently, used WC  for long distances     Hand Dominance   Dominant Hand: Right    Extremity/Trunk Assessment   Upper Extremity Assessment: Generalized weakness (-4/5 BUEs)           Lower Extremity Assessment: RLE deficits/detail RLE Deficits / Details: knee extension 2/5, SLR 2/5, toes numb to light touch    Cervical / Trunk Assessment: Normal  Communication   Communication: No difficulties  Cognition Arousal/Alertness: Awake/alert Behavior During  Therapy: WFL for tasks assessed/performed Overall Cognitive Status: Within Functional Limits for tasks assessed                      General Comments      Exercises        Assessment/Plan    PT Assessment Patient needs continued PT services  PT Diagnosis Generalized weakness;Acute pain;Difficulty walking   PT Problem List Decreased strength;Decreased activity tolerance;Decreased balance;Pain;Impaired sensation;Decreased knowledge of use of DME;Decreased mobility;Obesity;Decreased knowledge of precautions  PT Treatment Interventions DME instruction;Gait training;Functional mobility training;Therapeutic activities;Patient/family education;Balance training;Therapeutic exercise   PT Goals (Current goals can be found in the Care Plan section) Acute Rehab PT Goals Patient Stated Goal: to walk PT Goal Formulation: With patient Time For Goal Achievement: 02/19/14 Potential to Achieve Goals: Fair    Frequency Min 3X/week   Barriers to discharge        Co-evaluation               End of Session Equipment Utilized During Treatment: Gait belt Activity Tolerance: Patient limited by fatigue;Patient limited by pain Patient left: in chair;with call bell/phone within reach Nurse Communication: Mobility status         Time: 8921-1941 PT Time Calculation (min): 35 min   Charges:   PT Evaluation $Initial PT Evaluation Tier I: 1 Procedure PT Treatments $Therapeutic Activity: 23-37 mins   PT G Codes:          Philomena Doheny 02/12/2014, 3:10 PM [phone]

## 2014-02-13 DIAGNOSIS — S82851S Displaced trimalleolar fracture of right lower leg, sequela: Secondary | ICD-10-CM

## 2014-02-13 LAB — CBC
HCT: 25.4 % — ABNORMAL LOW (ref 36.0–46.0)
HEMOGLOBIN: 8.1 g/dL — AB (ref 12.0–15.0)
MCH: 26.7 pg (ref 26.0–34.0)
MCHC: 31.9 g/dL (ref 30.0–36.0)
MCV: 83.8 fL (ref 78.0–100.0)
PLATELETS: 91 10*3/uL — AB (ref 150–400)
RBC: 3.03 MIL/uL — ABNORMAL LOW (ref 3.87–5.11)
RDW: 13.3 % (ref 11.5–15.5)
WBC: 6 10*3/uL (ref 4.0–10.5)

## 2014-02-13 LAB — GLUCOSE, CAPILLARY
GLUCOSE-CAPILLARY: 146 mg/dL — AB (ref 70–99)
Glucose-Capillary: 145 mg/dL — ABNORMAL HIGH (ref 70–99)
Glucose-Capillary: 135 mg/dL — ABNORMAL HIGH (ref 70–99)
Glucose-Capillary: 110 mg/dL — ABNORMAL HIGH (ref 70–99)

## 2014-02-13 LAB — BASIC METABOLIC PANEL
ANION GAP: 10 (ref 5–15)
BUN: 23 mg/dL (ref 6–23)
CALCIUM: 9.1 mg/dL (ref 8.4–10.5)
CHLORIDE: 103 meq/L (ref 96–112)
CO2: 32 mEq/L (ref 19–32)
Creatinine, Ser: 0.79 mg/dL (ref 0.50–1.10)
GFR calc Af Amer: >90 mL/min (ref >90)
GFR calc non Af Amer: 83 mL/min — ABNORMAL LOW (ref >90)
GLUCOSE: 109 mg/dL — AB (ref 70–99)
Potassium: 4.7 mEq/L (ref 3.7–5.3)
SODIUM: 145 meq/L (ref 137–147)

## 2014-02-13 MED ORDER — ALBUTEROL SULFATE (2.5 MG/3ML) 0.083% IN NEBU
2.5000 mg | INHALATION_SOLUTION | Freq: Three times a day (TID) | RESPIRATORY_TRACT | Status: DC
  Administered 2014-02-13 – 2014-02-16 (×9): 2.5 mg via RESPIRATORY_TRACT
  Filled 2014-02-13 (×9): qty 3

## 2014-02-13 MED ORDER — OXYCODONE-ACETAMINOPHEN 5-325 MG PO TABS
1.0000 | ORAL_TABLET | Freq: Four times a day (QID) | ORAL | Status: DC | PRN
  Administered 2014-02-13 – 2014-02-16 (×11): 1 via ORAL
  Filled 2014-02-13 (×12): qty 1

## 2014-02-13 MED ORDER — SENNOSIDES-DOCUSATE SODIUM 8.6-50 MG PO TABS
1.0000 | ORAL_TABLET | Freq: Two times a day (BID) | ORAL | Status: DC
  Administered 2014-02-13: 1 via ORAL
  Filled 2014-02-13 (×3): qty 1

## 2014-02-13 NOTE — Progress Notes (Signed)
PROGRESS NOTE  [name] ERD:[id number] DOB: 27-Nov-1945 DOA: 02/11/2014 PCP: Brooke Sou, MD  HPI: Brooke Mccullough is a 68 y.o. female with multiple medical issues. She fell today and had pain in her right ankle. She usually walks with a walker. Not very active at home. Patient did not lose consciousness. No bowel or bladder incontinence. She wears 3.5L O2 at home. She follows with a pain clinic for her chronic pain. Her PCP took her off lasix BID to lasix daily about 1 month ago and patient had had gradual weight increase- 9lbs in 10 days  Subjective/ 24 H Interval events - ongoing RLE pain  Assessment/Plan: Closed right trimalleolar fracture - s/p ORIF 10/22  - PT recommending SNF, patient agreeable - DVT prophylaxis per ortho - recent echo 2015 with no wall abnormalities  - 2 years ago, had surgery on left ankle with no issues  COPD - stable- on 3.5L at home continuously, nebs PRN Morbid obesity  Diastolic heart failure  - daily weights DM - SSI, continue lantus and novolog  - Last A1C shows good control CKD stage III - Cr within normal limits today  Thrombocytopenia - for the past 6 months, unclear etiology, stable, no active bleeding, closely monitor.  - improving ABLA post op - monitor, Hb 8.7 >> 8.1, no indication for transfusion today    Diet: carb modified Fluids: none  DVT Prophylaxis: Lovenox  Code Status: Full Family Communication: none  Disposition Plan: inpatient, SNF when ready   Consultants:  Orthopedic surgery   Procedures:  ORIF R ankle 10/22   Antibiotics  Anti-infectives   Start     Dose/Rate Route Frequency Ordered Stop   02/11/14 2200  clindamycin (CLEOCIN) IVPB 600 mg     600 mg 100 mL/hr over 30 Minutes Intravenous 3 times per day 02/11/14 2015 02/12/14 1420     Studies Filed Vitals:   02/12/14 0519 02/12/14 0835 02/12/14 1453 02/12/14 2039  BP: 158/47  148/52 136/42  Pulse: 93  90 84  Temp: 97.8 F (36.6 C)  98.6  F (37 C) 99.5 F (37.5 C)  TempSrc: Oral  Oral   Resp: 18  18 18   Height:      Weight:      SpO2: 94% 99% 98% 90%    Intake/Output Summary (Last 24 hours) at 02/13/14 0739 Last data filed at 02/12/14 2200  Gross per 24 hour  Intake    413 ml  Output    400 ml  Net     13 ml   Filed Weights   02/11/14 1111  Weight: 97.523 kg (215 lb)   Exam:  General:  NAD  Cardiovascular: RRR  Respiratory: CTA biL  Abdomen: soft, non tender  MSK: no edema  Neuro: non focal  Data Reviewed: Basic Metabolic Panel:  Recent Labs Lab 02/11/14 1150 02/12/14 0540 02/13/14 0423  NA 142 146 145  K 5.0 4.6 4.7  CL 99 102 103  CO2 34* 33* 32  GLUCOSE 176* 197* 109*  BUN 43* 27* 23  CREATININE 0.68 0.65 0.79  CALCIUM 9.5 9.2 9.1   CBC:  Recent Labs Lab 02/11/14 1150 02/12/14 0540 02/13/14 0423  WBC 8.5 5.9 6.0  NEUTROABS 7.1  --   --   HGB 10.0* 8.7* 8.1*  HCT 31.5* 28.2* 25.4*  MCV 84.5 84.7 83.8  PLT 94* 87* 91*   BNP (last 3 results)  Recent Labs  09/28/13 1304 10/07/13 1908  PROBNP 356.8* 919.7*  CBG:  Recent Labs Lab 02/12/14 0634 02/12/14 1217 02/12/14 1620 02/12/14 2125 02/13/14 0620  GLUCAP 181* 274* 233* 137* 145*   Studies: Dg Ankle Complete Right Feb 14, 2014 Trimalleolar fracture with hardware fixation of the medial and lateral malleoli, as described above.  Dg Ankle Complete Right February 14, 2014 Trimalleolar fracture. Marked soft tissue swelling. Disruption of the ankle mortise.    Dg Chest Portable 1 View 02-14-2014 Mild right lower lobe subsegmental atelectasis or pneumonia is noted. Followup radiographs are recommended.  Dg Knee Complete 4 Views Right 02-14-2014 Negative for fracture or effusion.   Electronically Signed   By: Rolla Flatten M.D.   On: 02/14/2014 13:23   Dg C-arm 1-60 Min 02-14-2014 Trimalleolar fracture with hardware fixation of the medial and lateral malleoli, as described above.    Scheduled Meds: . albuterol  2.5 mg  Nebulization Q6H  . aspirin EC  81 mg Oral Daily  . atorvastatin  40 mg Oral Daily  . calcium gluconate  2 tablet Oral Daily  . citalopram  40 mg Oral Daily  . clopidogrel  75 mg Oral Daily  . enoxaparin (LOVENOX) injection  40 mg Subcutaneous Q24H  . furosemide  40 mg Oral Daily  . insulin aspart  0-15 Units Subcutaneous TID WC  . insulin aspart  0-5 Units Subcutaneous QHS  . insulin aspart  6 Units Subcutaneous TID AC  . insulin glargine  40 Units Subcutaneous BID  . levothyroxine  100 mcg Oral QAC breakfast  . lisinopril  10 mg Oral Daily  . magnesium oxide  400 mg Oral BID  . meclizine  25 mg Oral Daily  . morphine  15 mg Oral q morning - 10a   And  . morphine  30 mg Oral QHS  . multivitamin with minerals  1 tablet Oral Daily  . pantoprazole  80 mg Oral Daily  . potassium chloride SA  20 mEq Oral Daily  . pregabalin  150 mg Oral BID  . rOPINIRole  2 mg Oral QHS  . sodium chloride  3 mL Intravenous Q12H  . tiotropium  18 mcg Inhalation Daily   Continuous Infusions: . lactated ringers 75 mL/hr at 02-14-2014 2135    Active Problems:   Diabetes   Chronic pain syndrome   Multinodular goiter (nontoxic)   Chronic renal insufficiency, stage III (moderate)   Hypothyroidism   HTN (hypertension), benign   Closed right trimalleolar fracture   COPD (chronic obstructive pulmonary disease)   Time spent: [place], MD [facility] Pager [phone]. If 7 PM - 7 AM, please contact night-coverage at [email or web address], password [facility] 02/13/2014, 7:39 AM  LOS: 2 days

## 2014-02-13 NOTE — Progress Notes (Signed)
PATIENT ID: Brooke Mccullough  MRN: [id number]  DOB/AGE:  [birth year] / 68 y.o.  2 Days Post-Op Procedure(s) (LRB): OPEN REDUCTION INTERNAL FIXATION (ORIF) ANKLE FRACTURE (Right)    PROGRESS NOTE Subjective:   Patient is alert, oriented, no Nausea, no Vomiting, no passing gas, no Bowel Movement. Taking PO well. Denies SOB, Chest or Calf Pain. Using Incentive Spirometer, PAS in place. Ambulate Non weight bearing to right leg, Patient reports pain as severe,     Objective: Vital signs in last 24 hours: Temp:  [98.6 F (37 C)-99.5 F (37.5 C)] 99.5 F (37.5 C) (10/23 2039) Pulse Rate:  [84-90] 84 (10/23 2039) Resp:  [18] 18 (10/23 2039) BP: (136-148)/(42-52) 136/42 mmHg (10/23 2039) SpO2:  [90 %-99 %] 90 % (10/23 2039)    Intake/Output from previous day: I/O last 3 completed shifts: In: 1008 [P.O.:480; I.V.:378; IV Piggyback:150] Out: 1425 [Urine:1425]   Intake/Output this shift:     LABORATORY DATA:  Recent Labs  02/11/14 1150  02/12/14 0540  02/12/14 1620 02/12/14 2125 02/13/14 0423 02/13/14 0620  WBC 8.5  --  5.9  --   --   --  6.0  --   HGB 10.0*  --  8.7*  --   --   --  8.1*  --   HCT 31.5*  --  28.2*  --   --   --  25.4*  --   PLT 94*  --  87*  --   --   --  91*  --   NA 142  --  146  --   --   --  145  --   K 5.0  --  4.6  --   --   --  4.7  --   CL 99  --  102  --   --   --  103  --   CO2 34*  --  33*  --   --   --  32  --   BUN 43*  --  27*  --   --   --  23  --   CREATININE 0.68  --  0.65  --   --   --  0.79  --   GLUCOSE 176*  --  197*  --   --   --  109*  --   GLUCAP  --   < >  --   < > 233* 137*  --  145*  INR 1.02  --   --   --   --   --   --   --   CALCIUM 9.5  --  9.2  --   --   --  9.1  --   < > = values in this interval not displayed.  Examination: Neurologically intact Neurovascular intact Sensation intact distally pt able to wiggle toes and has intact sensation.  Splint in place and not removed, it is clean and dry.  Assessment:   2 Days  Post-Op Procedure(s) (LRB): OPEN REDUCTION INTERNAL FIXATION (ORIF) ANKLE FRACTURE (Right) ADDITIONAL DIAGNOSIS:  Hypertension and COPD, hypothyroid  Plan:  Order for percocet entered to cover break through pain.   Up with physical therapy with walker nonweightbearing on right.  Continue to elevate right leg as much as possible.  Okay from orthopedic viewpoint to discharge when patient passes physical therapy. If she does not pass physical therapy she may need skilled nursing facility at her current facility in [place].  Dr. Romero Liner PA-C are  on call over weekend. She'll need an Rx for Percocet (already printed) for pain.  Will need followup with Dr. Berenice Primas in 2 weeks     [name], [name] 02/13/2014, 7:45 AM

## 2014-02-13 NOTE — Progress Notes (Signed)
Physical Therapy Treatment Patient Details Name: Brooke Mccullough MRN: [id number] DOB: 1945-05-09 Today's Date: 02/13/2014    History of Present Illness 68 y.o. female with h/o L ankle fx, COPD on O2, DM, gout, IBS, back pain, fibromyalgia, CHF admitted with R ankle fx, s/p ORIF 02/11/14.     PT Comments    Patient progressing this session with transfer however still having difficulty with pivoting. Continue to recommend SNF for ongoing PT.   Follow Up Recommendations  SNF     Equipment Recommendations  None recommended by PT    Recommendations for Other Services       Precautions / Restrictions Precautions Precautions: Fall Restrictions RLE Weight Bearing: Non weight bearing    Mobility  Bed Mobility Overal bed mobility: Needs Assistance Bed Mobility: Supine to Sit     Supine to sit: HOB elevated;Mod assist     General bed mobility comments: assist to support RLE and to raise trunk  Transfers Overall transfer level: Needs assistance Equipment used: Rolling walker (2 wheeled)   Sit to Stand: +2 physical assistance;Mod assist Stand pivot transfers: +2 physical assistance;Max assist       General transfer comment: Patient able to pivot on LLE today needing Max A to ensure safety and NWB on R side. Patient did have to sit prematurely due to inability to continue NWB on R. Cues for pivot transfer and positioning of RW  Ambulation/Gait                 Stairs            Wheelchair Mobility    Modified Rankin (Stroke Patients Only)       Balance                                    Cognition Arousal/Alertness: Awake/alert Behavior During Therapy: WFL for tasks assessed/performed Overall Cognitive Status: Within Functional Limits for tasks assessed                      Exercises      General Comments        Pertinent Vitals/Pain Pain Score: 9  Pain Location: R ankle pain Pain Descriptors / Indicators:  Aching;Sore Pain Intervention(s): Monitored during session;Repositioned    Home Living                      Prior Function            PT Goals (current goals can now be found in the care plan section) Progress towards PT goals: Progressing toward goals    Frequency  Min 3X/week    PT Plan Current plan remains appropriate    Co-evaluation             End of Session Equipment Utilized During Treatment: Gait belt Activity Tolerance: Patient tolerated treatment well;Patient limited by fatigue;Patient limited by pain Patient left: in chair     Time: 1316-1335 PT Time Calculation (min): 19 min  Charges:  $Therapeutic Activity: 8-22 mins                    G Codes:      Jacqualyn Posey 02/13/2014, 1:48 PM 02/13/2014 Jacqualyn Posey PTA [phone] pager [phone] office

## 2014-02-14 LAB — GLUCOSE, CAPILLARY
GLUCOSE-CAPILLARY: 121 mg/dL — AB (ref 70–99)
GLUCOSE-CAPILLARY: 217 mg/dL — AB (ref 70–99)
Glucose-Capillary: 138 mg/dL — ABNORMAL HIGH (ref 70–99)
Glucose-Capillary: 135 mg/dL — ABNORMAL HIGH (ref 70–99)

## 2014-02-14 LAB — CBC
HCT: 24.6 % — ABNORMAL LOW (ref 36.0–46.0)
Hemoglobin: 7.8 g/dL — ABNORMAL LOW (ref 12.0–15.0)
MCH: 26.4 pg (ref 26.0–34.0)
MCHC: 31.7 g/dL (ref 30.0–36.0)
MCV: 83.4 fL (ref 78.0–100.0)
PLATELETS: 92 10*3/uL — AB (ref 150–400)
RBC: 2.95 MIL/uL — ABNORMAL LOW (ref 3.87–5.11)
RDW: 13.5 % (ref 11.5–15.5)
WBC: 6.9 10*3/uL (ref 4.0–10.5)

## 2014-02-14 MED ORDER — HYDROMORPHONE HCL 1 MG/ML IJ SOLN
1.0000 mg | Freq: Four times a day (QID) | INTRAMUSCULAR | Status: DC | PRN
  Administered 2014-02-14 – 2014-02-16 (×6): 1 mg via INTRAVENOUS
  Filled 2014-02-14 (×6): qty 1

## 2014-02-14 MED ORDER — CETYLPYRIDINIUM CHLORIDE 0.05 % MT LIQD
7.0000 mL | Freq: Two times a day (BID) | OROMUCOSAL | Status: DC
  Administered 2014-02-14 – 2014-02-16 (×4): 7 mL via OROMUCOSAL

## 2014-02-14 NOTE — Progress Notes (Signed)
PROGRESS NOTE  Brooke Mccullough WVP:[id number] DOB: 1945/10/31 DOA: 02/11/2014 PCP: Tammi Sou, MD  HPI: Brooke Mccullough is a 68 y.o. female with multiple medical issues. She fell today and had pain in her right ankle. She usually walks with a walker. Not very active at home. Patient did not lose consciousness. No bowel or bladder incontinence. She wears 3.5L O2 at home. She follows with a pain clinic for her chronic pain. Her PCP took her off lasix BID to lasix daily about 1 month ago and patient had had gradual weight increase- 9lbs in 10 days  Subjective/ 24 H Interval events - ongoing RLE pain, hasn't had any IV medications overnight and her pain is severe this morning  Assessment/Plan: Closed right trimalleolar fracture - s/p ORIF 10/22  - PT recommending SNF, patient agreeable, SW consult - DVT prophylaxis per ortho - recent echo 2015 with no wall abnormalities  - 2 years ago, had surgery on left ankle with no issues  COPD - stable- on 3.5L at home continuously, nebs PRN Morbid obesity  Diastolic heart failure  - daily weights DM - SSI, continue lantus and novolog  - Last A1C shows good control CKD stage III - Cr within normal limits today  Thrombocytopenia - for the past 6 months, unclear etiology, stable, no active bleeding, closely monitor.  - improving ABLA post op - monitor, Hb 8.7 >> 8.1, no indication for transfusion today    Diet: carb modified Fluids: none  DVT Prophylaxis: Lovenox  Code Status: Full Family Communication: none  Disposition Plan: inpatient, SNF when ready   Consultants:  Orthopedic surgery   Procedures:  ORIF R ankle 10/22   Antibiotics  Anti-infectives   Start     Dose/Rate Route Frequency Ordered Stop   02/11/14 2200  clindamycin (CLEOCIN) IVPB 600 mg     600 mg 100 mL/hr over 30 Minutes Intravenous 3 times per day 02/11/14 2015 02/12/14 1420     Studies Filed Vitals:   02/13/14 1557 02/13/14 1951 02/13/14 2146  02/14/14 0549  BP: 163/49 153/43  144/51  Pulse: 92 90  81  Temp: 98.6 F (37 C) 100.5 F (38.1 C) 98.9 F (37.2 C) 99 F (37.2 C)  TempSrc: Oral Oral Oral Oral  Resp: 16 18  16   Height:      Weight: 101.197 kg (223 lb 1.6 oz)     SpO2: 92% 93%  93%    Intake/Output Summary (Last 24 hours) at 02/14/14 0827 Last data filed at 02/14/14 0745  Gross per 24 hour  Intake    846 ml  Output    200 ml  Net    646 ml   Filed Weights   02/11/14 1111 02/13/14 1557  Weight: 97.523 kg (215 lb) 101.197 kg (223 lb 1.6 oz)   Exam:  General:  NAD  Cardiovascular: RRR  Respiratory: CTA biL  Abdomen: soft, non tender  MSK: no edema  Neuro: non focal  Data Reviewed: Basic Metabolic Panel:  Recent Labs Lab 02/11/14 1150 02/12/14 0540 02/13/14 0423  NA 142 146 145  K 5.0 4.6 4.7  CL 99 102 103  CO2 34* 33* 32  GLUCOSE 176* 197* 109*  BUN 43* 27* 23  CREATININE 0.68 0.65 0.79  CALCIUM 9.5 9.2 9.1   CBC:  Recent Labs Lab 02/11/14 1150 02/12/14 0540 02/13/14 0423 02/14/14 0415  WBC 8.5 5.9 6.0 6.9  NEUTROABS 7.1  --   --   --   HGB 10.0* 8.7*  8.1* 7.8*  HCT 31.5* 28.2* 25.4* 24.6*  MCV 84.5 84.7 83.8 83.4  PLT 94* 87* 91* 92*   BNP (last 3 results)  Recent Labs  09/28/13 1304 10/07/13 1908  PROBNP 356.8* 919.7*   CBG:  Recent Labs Lab 02/13/14 0620 02/13/14 1142 02/13/14 1635 02/13/14 2142 02/14/14 0656  GLUCAP 145* 146* 135* 110* 121*   Studies: Dg Ankle Complete Right Mar 03, 2014 Trimalleolar fracture with hardware fixation of the medial and lateral malleoli, as described above.  Dg Ankle Complete Right 03/03/2014 Trimalleolar fracture. Marked soft tissue swelling. Disruption of the ankle mortise.    Dg Chest Portable 1 View 03/03/14 Mild right lower lobe subsegmental atelectasis or pneumonia is noted. Followup radiographs are recommended.  Dg Knee Complete 4 Views Right 03/03/14 Negative for fracture or effusion.   Electronically Signed    By: Rolla Flatten M.D.   On: 2014/03/03 13:23   Dg C-arm 1-60 Min 03/03/14 Trimalleolar fracture with hardware fixation of the medial and lateral malleoli, as described above.    Scheduled Meds: . albuterol  2.5 mg Nebulization TID  . antiseptic oral rinse  7 mL Mouth Rinse BID  . aspirin EC  81 mg Oral Daily  . atorvastatin  40 mg Oral Daily  . calcium gluconate  2 tablet Oral Daily  . citalopram  40 mg Oral Daily  . clopidogrel  75 mg Oral Daily  . enoxaparin (LOVENOX) injection  40 mg Subcutaneous Q24H  . furosemide  40 mg Oral Daily  . insulin aspart  0-15 Units Subcutaneous TID WC  . insulin aspart  0-5 Units Subcutaneous QHS  . insulin aspart  6 Units Subcutaneous TID AC  . insulin glargine  40 Units Subcutaneous BID  . levothyroxine  100 mcg Oral QAC breakfast  . lisinopril  10 mg Oral Daily  . magnesium oxide  400 mg Oral BID  . morphine  15 mg Oral q morning - 10a   And  . morphine  30 mg Oral QHS  . multivitamin with minerals  1 tablet Oral Daily  . pantoprazole  80 mg Oral Daily  . potassium chloride SA  20 mEq Oral Daily  . pregabalin  150 mg Oral BID  . rOPINIRole  2 mg Oral QHS  . senna-docusate  1 tablet Oral BID  . sodium chloride  3 mL Intravenous Q12H  . tiotropium  18 mcg Inhalation Daily   Continuous Infusions: . lactated ringers 75 mL/hr at 03/03/14 2135    Active Problems:   Diabetes   Chronic pain syndrome   Multinodular goiter (nontoxic)   Chronic renal insufficiency, stage III (moderate)   Hypothyroidism   HTN (hypertension), benign   Closed right trimalleolar fracture   COPD (chronic obstructive pulmonary disease)   Time spent: [place], MD [facility] Pager [phone]. If 7 PM - 7 AM, please contact night-coverage at [email or web address], password [facility] 02/14/2014, 8:27 AM  LOS: 3 days

## 2014-02-14 NOTE — Progress Notes (Signed)
PATIENT ID: Brooke Mccullough  MRN: [id number]  DOB/AGE:  01-15-1946 / 68 y.o.  69 Days Post-Op Procedure(s) (LRB): OPEN REDUCTION INTERNAL FIXATION (ORIF) ANKLE FRACTURE (Right)    PROGRESS NOTE Subjective:   Patient is alert, oriented, no Nausea, no Vomiting, yes passing gas, no Bowel Movement. Taking PO well. Denies SOB, Chest or Calf Pain. Using Incentive Spirometer, PAS in place. Ambulate - Non weightbearing to right leg, Patient reports pain as moderate,     Objective: Vital signs in last 24 hours: Temp:  [98.6 F (37 C)-100.5 F (38.1 C)] 99 F (37.2 C) (10/25 0549) Pulse Rate:  [81-93] 81 (10/25 0549) Resp:  [16-18] 16 (10/25 0549) BP: (144-163)/(43-51) 144/51 mmHg (10/25 0549) SpO2:  [92 %-98 %] 93 % (10/25 0549) Weight:  [101.197 kg (223 lb 1.6 oz)] 101.197 kg (223 lb 1.6 oz) (10/24 1557)    Intake/Output from previous day: I/O last 3 completed shifts: In: 1089 [P.O.:1080; I.V.:9] Out: -    Intake/Output this shift: Total I/O In: -  Out: 200 [Urine:200]   LABORATORY DATA:  Recent Labs  02/11/14 1150  02/12/14 0540  02/13/14 0423  02/13/14 1635 02/13/14 2142 02/14/14 0415 02/14/14 0656  WBC 8.5  --  5.9  --  6.0  --   --   --  6.9  --   HGB 10.0*  --  8.7*  --  8.1*  --   --   --  7.8*  --   HCT 31.5*  --  28.2*  --  25.4*  --   --   --  24.6*  --   PLT 94*  --  87*  --  91*  --   --   --  92*  --   NA 142  --  146  --  145  --   --   --   --   --   K 5.0  --  4.6  --  4.7  --   --   --   --   --   CL 99  --  102  --  103  --   --   --   --   --   CO2 34*  --  33*  --  32  --   --   --   --   --   BUN 43*  --  27*  --  23  --   --   --   --   --   CREATININE 0.68  --  0.65  --  0.79  --   --   --   --   --   GLUCOSE 176*  --  197*  --  109*  --   --   --   --   --   GLUCAP  --   < >  --   < >  --   < > 135* 110*  --  121*  INR 1.02  --   --   --   --   --   --   --   --   --   CALCIUM 9.5  --  9.2  --  9.1  --   --   --   --   --   < > = values in this  interval not displayed.  Examination: Neurologically intact Sensation intact distally} Splint in place and not removed, it is clean and dry.  Assessment:   69 Days Post-Op  Procedure(s) (LRB): OPEN REDUCTION INTERNAL FIXATION (ORIF) ANKLE FRACTURE (Right) ADDITIONAL DIAGNOSIS:  Hypertension and COPD and hypothyroid  Plan:  Non Weight Bearing (NWB) for right lower extremity  DVT Prophylaxis:  Lovenox  DISCHARGE PLAN: Skilled Nursing Facility/Rehab in [place] when bed Available and Pt passes therapy goals.  DISCHARGE NEEDS: HHPT, HHRN, Walker and 3-in-1 comode seat     Garrell Flagg R 02/14/2014, 8:10 AM

## 2014-02-15 LAB — CBC
HCT: 24.2 % — ABNORMAL LOW (ref 36.0–46.0)
Hemoglobin: 7.8 g/dL — ABNORMAL LOW (ref 12.0–15.0)
MCH: 27.2 pg (ref 26.0–34.0)
MCHC: 32.2 g/dL (ref 30.0–36.0)
MCV: 84.3 fL (ref 78.0–100.0)
PLATELETS: 100 10*3/uL — AB (ref 150–400)
RBC: 2.87 MIL/uL — ABNORMAL LOW (ref 3.87–5.11)
RDW: 13.8 % (ref 11.5–15.5)
WBC: 7.5 10*3/uL (ref 4.0–10.5)

## 2014-02-15 LAB — GLUCOSE, CAPILLARY
Glucose-Capillary: 123 mg/dL — ABNORMAL HIGH (ref 70–99)
Glucose-Capillary: 213 mg/dL — ABNORMAL HIGH (ref 70–99)
Glucose-Capillary: 110 mg/dL — ABNORMAL HIGH (ref 70–99)
Glucose-Capillary: 139 mg/dL — ABNORMAL HIGH (ref 70–99)

## 2014-02-15 NOTE — Clinical Social Work Note (Signed)
[facility] (where pt chooses to go for STR) will not have a female SNF bed available until possibly Wednesday.  Per MD report, pt is ready to discharge (dc) today.  Options were presented to patient (pt).  Pt does not wish to be transferred to a SNF today and transfer to [facility] on Wednesday.  Pt states that she has a husband at home with her and is willing to pay for private duty nursing.  CSW acknowledged this concern and reviewed this possibility with [facility].  Of note, pt is from the retirement side of [place].  [facility] is aware of these possible dc plans and is agreeable (per [place]).  CSW has made referral to [facility] for review of options.  Pt is aware of this consult and agreeable.  CSW remains following.  Nonnie Done, [place] [phone]  Psychiatric & Orthopedics (5N 1-16) Clinical Social Worker

## 2014-02-15 NOTE — Clinical Social Work Psychosocial (Addendum)
Clinical Social Work Department BRIEF PSYCHOSOCIAL ASSESSMENT 02/15/2014  Patient:  Brooke Mccullough, Brooke Mccullough     Account Number:  [id number]     Admit date:  02/11/2014  Clinical Social Worker:  Wylene Men  Date/Time:  02/15/2014 11:00 AM  Referred by:  Physician  Date Referred:  02/15/2014 Referred for  SNF Placement  Psychosocial assessment   Other Referral:   none   Interview type:  Patient Other interview type:   none    PSYCHOSOCIAL DATA Living Status:  HUSBAND Admitted from facility:   Level of care:   Primary support name:  Dellis Filbert Primary support relationship to patient:  CHILD, ADULT Degree of support available:   adequate    CURRENT CONCERNS Current Concerns  Post-Acute Placement   Other Concerns:   none    SOCIAL WORK ASSESSMENT / PLAN PT is recommending SNF/STR upon medical dc.  Pt is alert and oriented x4.  Pt states she is from home with her husband and is agreeable to SNF once dc'd.  Pt is from [place] (independent living).  Pt is requesting [facility].  CSW contacted [facility] who states they currently are at capacity for female beds. CSW will review bed offers with pt.  FL2 has been signed.  Pt states having this surgery is an inconvenience though she is glad that she was a surgery candidate.  Pt expresses hopefulness regarding her prognosis and llife after recovery.   Assessment/plan status:  Psychosocial Support/Ongoing Assessment of Needs Other assessment/ plan:   FL2  PASARR   Information/referral to community resources:   SNF/STR    PATIENT'S/FAMILY'S RESPONSE TO PLAN OF CARE: Pt is requesting [facility] SNF in [place].  SNF is currently at capacity for female beds.  CSW will review bed offers with pt and continue to follow.       Nonnie Done, [place] [phone]  Psychiatric & Orthopedics (5N 1-16) Clinical Social Worker

## 2014-02-15 NOTE — Discharge Summary (Addendum)
Patient ID: Brooke Mccullough MRN: [id number] DOB/AGE: 69-Oct-1947 69 y.o.  Admit date: 02/11/2014 Discharge date: 02/16/2014 Admission Diagnoses:  Active Problems:   Diabetes   Chronic pain syndrome   Multinodular goiter (nontoxic)   Chronic renal insufficiency, stage III (moderate)   Hypothyroidism   HTN (hypertension), benign   Closed right trimalleolar fracture   COPD (chronic obstructive pulmonary disease)   Discharge Diagnoses:  Same  Past Medical History  Diagnosis Date  . COPD (chronic obstructive pulmonary disease)     nocturnal oxygen, plus daytime use with activity  . Fibromyalgia     Dx'd 1993.  This has impaired her significantly.  . IBS (irritable bowel syndrome)   . Edema   . Gallstones     asymptomatic per pt (as of 09/30/2013); mild chronic elevation of Alk phos (remainder of hepatic panel wnl).  . Hernia of abdominal wall   . Hypertension   . Hyperlipidemia   . On home oxygen therapy     "3.5L @ night and during day prn" (09/28/2013)  . History of pneumonia     "once"  . Adrenal benign tumor   . Type II diabetes mellitus     hx of good control per pt report + HbA1c <7% June 2015.  Marland Kitchen Anemia   . GERD (gastroesophageal reflux disease)   . History of duodenal ulcer   . Daily headache   . Migraine     "@ least monthly" (09/28/2013)  . TIA (transient ischemic attack) 2012; 2014    left facial and left arm numbness  . Degenerative disc disease     Cervical and lumbar  . Osteoarthritis (arthritis due to wear and tear of joints)     "back; knees; elbows; left ankle" (09/28/2013)  . Chronic lower back pain     "L5-S1" (09/28/2013)  . Gout   . Anxiety   . Depression   . Breast cancer 2008    "right".  Lumpectomy + radiation+aromatase inhibitor (femara).  Regular f/u and mammos normal.  . Tremor     "upper extremities; last 6-7 months" (09/28/2013)  . Unspecified hypothyroidism 12/21/2013  . Multinodular goiter summer 2015    FNA 03/25/47 "Follicular lesion of  undetermined significance": a follicular lesion/neoplasm can not be ruled out--Dr. Cruzita Lederer recommended repeat bx in 6-12 mo    Surgeries: Procedure(s):right OPEN REDUCTION INTERNAL FIXATION (ORIF) ANKLE FRACTURE on 02/11/2014   Consultants:  internal medicine/hospitalist  Discharged Condition: Improved  Hospital Course: Brooke Mccullough is an 68 y.o. female who was admitted 02/11/2014 for operative treatment of trimalleolar fracture of the right ankle. Patient has severe unremitting pain that affects sleep, daily activities, and work/hobbies. After pre-op clearance the patient was taken to the operating room on 02/11/2014 and underwent  Procedure(s):right OPEN REDUCTION INTERNAL FIXATION (ORIF) ANKLE FRACTURE.    Patient was given perioperative antibiotics: Anti-infectives   Start     Dose/Rate Route Frequency Ordered Stop   02/11/14 2200  clindamycin (CLEOCIN) IVPB 600 mg     600 mg 100 mL/hr over 30 Minutes Intravenous 3 times per day 02/11/14 2015 02/12/14 1420       Patient was given sequential compression devices, early ambulation, and chemoprophylaxis to prevent DVT.she has multiple medical issues which were managed by the hospitalist.  Patient benefited maximally from hospital stay and there were no complications.    Recent vital signs: Patient Vitals for the past 24 hrs:  BP Temp Temp src Pulse Resp SpO2 Weight  02/15/14 0827 - - - - -  96 % -  02/15/14 0539 157/62 mmHg 98.1 F (36.7 C) Oral 76 18 92 % 101.969 kg (224 lb 12.8 oz)  02/15/14 0349 - - - - - - 100.699 kg (222 lb)  02/14/14 2012 - - - - - 96 % -  02/14/14 1937 144/52 mmHg 98.3 F (36.8 C) Oral 79 18 95 % -  02/14/14 1804 - - - - - - 99.247 kg (218 lb 12.8 oz)  02/14/14 1415 139/41 mmHg 98.9 F (37.2 C) Oral 81 16 95 % -  02/14/14 1046 144/47 mmHg 98.4 F (36.9 C) Oral 86 16 94 % -     Recent laboratory studies:  Recent Labs  02/13/14 0423 02/14/14 0415 02/15/14 0615  WBC 6.0 6.9 7.5  HGB 8.1* 7.8*  7.8*  HCT 25.4* 24.6* 24.2*  PLT 91* 92* 100*  NA 145  --   --   K 4.7  --   --   CL 103  --   --   CO2 32  --   --   BUN 23  --   --   CREATININE 0.79  --   --   GLUCOSE 109*  --   --   CALCIUM 9.1  --   --      Discharge Medications:     Medication List         albuterol 108 (90 BASE) MCG/ACT inhaler  Commonly known as:  PROVENTIL HFA;VENTOLIN HFA  Inhale 2 puffs into the lungs every 6 (six) hours as needed for wheezing or shortness of breath.     albuterol (2.5 MG/3ML) 0.083% nebulizer solution  Commonly known as:  PROVENTIL  Take 3 mLs (2.5 mg total) by nebulization every 4 (four) hours as needed for wheezing or shortness of breath.     ALPRAZolam 0.5 MG tablet  Commonly known as:  XANAX  Take 1 tablet (0.5 mg total) by mouth daily as needed for anxiety or sleep.     aspirin EC 81 MG tablet  Take 81 mg by mouth daily.     atorvastatin 40 MG tablet  Commonly known as:  LIPITOR  Take 1 tablet (40 mg total) by mouth daily.     calcium gluconate 500 MG tablet  Take 2 tablets by mouth daily.     citalopram 40 MG tablet  Commonly known as:  CELEXA  Take 40 mg by mouth daily.     clopidogrel 75 MG tablet  Commonly known as:  PLAVIX  Take 75 mg by mouth daily.     ferrous sulfate 325 (65 FE) MG tablet  Take 325 mg by mouth daily with breakfast.     furosemide 40 MG tablet  Commonly known as:  LASIX  Take 1 tablet (40 mg total) by mouth daily.     insulin aspart 100 UNIT/ML injection  Commonly known as:  novoLOG  Inject 6 Units into the skin 3 (three) times daily before meals.     Insulin Glargine 100 UNIT/ML Solostar Pen  Commonly known as:  LANTUS  Inject 40 Units into the skin 2 (two) times daily. 40 units BID     Insulin Pen Needle 32G X 4 MM Misc  Commonly known as:  BD PEN NEEDLE NANO U/F  Use pen needles as directed.     letrozole 2.5 MG tablet  Commonly known as:  FEMARA  Take 1 tablet (2.5 mg total) by mouth daily.     levothyroxine 100 MCG  tablet  Commonly  known as:  SYNTHROID, LEVOTHROID  Take 1 tablet (100 mcg total) by mouth daily before breakfast.     lisinopril 10 MG tablet  Commonly known as:  PRINIVIL,ZESTRIL  Take 1 tablet (10 mg total) by mouth daily.     Magnesium Oxide 500 MG (LAX) Tabs  Take 500 mg by mouth 2 (two) times daily.     morphine 15 MG 12 hr tablet  Commonly known as:  MS CONTIN  Take 15-30 mg by mouth 2 (two) times daily. 1 tab po qAM and 2 tabs po qhs     multivitamin with minerals Tabs tablet  Take 1 tablet by mouth daily.     omeprazole 40 MG capsule  Commonly known as:  PRILOSEC  Take 1 capsule (40 mg total) by mouth daily.     oxyCODONE-acetaminophen 5-325 MG per tablet  Commonly known as:  PERCOCET/ROXICET  Take 1-2 tablets by mouth every 6 (six) hours as needed for severe pain.     potassium chloride SA 20 MEQ tablet  Commonly known as:  K-DUR,KLOR-CON  Take 20 mEq by mouth daily.     pregabalin 150 MG capsule  Commonly known as:  LYRICA  Take 1 capsule (150 mg total) by mouth 2 (two) times daily.     rOPINIRole 1 MG tablet  Commonly known as:  REQUIP  Take 2 mg by mouth at bedtime. 2 tabs po qhs     tiotropium 18 MCG inhalation capsule  Commonly known as:  SPIRIVA  Place 18 mcg into inhaler and inhale daily.     VITAMIN D-3 PO  Take 1 tablet by mouth daily.        Diagnostic Studies: Dg Ankle Complete Right  02/11/2014   CLINICAL DATA:  Operative fixation of a right ankle trimalleolar fracture.  EXAM: DG C-ARM 61-120 MIN; RIGHT ANKLE - COMPLETE 3+ VIEW  COMPARISON:  Earlier today.  FINDINGS: Three C arm views of the right ankle demonstrate interval screw and plate fixation of the previously seen lateral malleolus fracture and screw fixation of the previously seen medial malleolus fracture. Anatomic position and alignment of the lateral malleolus fracture fragments and mild medial displacement of the medial fragment of the medial malleolus fracture. Previously noted  posterior malleolus fracture with mild proximal displacement of the posterior fragment.  IMPRESSION: Trimalleolar fracture with hardware fixation of the medial and lateral malleoli, as described above.   Electronically Signed   By: Enrique Sack M.D.   On: 02/11/2014 18:56   Dg Ankle Complete Right  02/11/2014   CLINICAL DATA:  [place] today, swelling and pain.  EXAM: RIGHT ANKLE - COMPLETE 3+ VIEW  COMPARISON:  None.  FINDINGS: Skeletal osteopenia with marked soft tissue swelling. There is a trimalleolar fracture with disruption of the ankle mortise.  IMPRESSION: Trimalleolar fracture. Marked soft tissue swelling. Disruption of the ankle mortise.   Electronically Signed   By: Rolla Flatten M.D.   On: 02/11/2014 13:19   US Thyroid Biopsy  01/27/2014   CLINICAL DATA:  68 year old female with right thyroid nodule which meet consensus criteria for biopsy axial prior thyroid ultrasound 11/09/2013  EXAM: ULTRASOUND GUIDED NEEDLE ASPIRATE BIOPSY OF THE THYROID GLAND  COMPARISON:  Prior thyroid ultrasound 11/09/2013  PROCEDURE: Thyroid biopsy was thoroughly discussed with the patient and questions were answered. The benefits, risks, alternatives, and complications were also discussed. The patient understands and wishes to proceed with the procedure. Written consent was obtained.  Ultrasound was performed to localize and mark an  adequate site for the biopsy. The patient was then prepped and draped in a normal sterile fashion. Local anesthesia was provided with 1% lidocaine. Using direct ultrasound guidance, 4 passes were made using needles into the nodule within the right lobe of the thyroid. Ultrasound was used to confirm needle placements on all occasions. Specimens were sent to Pathology for analysis.  COMPLICATIONS: None  FINDINGS: Successful identification of 2.5 cm solid nodule with internal dystrophic calcifications in the mid aspect of the right gland  IMPRESSION: Ultrasound guided needle aspirate biopsy performed  of the right thyroid nodule.  Signed,  Criselda Peaches, MD  [facility]  [facility]   Electronically Signed   By: Jacqulynn Cadet M.D.   On: 01/27/2014 17:16   Dg Chest Portable 1 View  02/11/2014   CLINICAL DATA:  Preop for right ankle surgery.  EXAM: PORTABLE CHEST - 1 VIEW  COMPARISON:  October 07, 2013.  FINDINGS: Stable cardiomegaly. No pneumothorax or significant pleural effusion is noted. Left lung is clear. Mild right lower lobe opacity is noted most consistent with subsegmental atelectasis or possibly pneumonia. Elevated right hemidiaphragm is again noted. Bony thorax is intact.  IMPRESSION: Mild right lower lobe subsegmental atelectasis or pneumonia is noted. Followup radiographs are recommended.   Electronically Signed   By: Sabino Dick M.D.   On: 02/11/2014 15:45   Dg Knee Complete 4 Views Right  02/11/2014   CLINICAL DATA:  [place], pain with soft tissue swelling  EXAM: RIGHT KNEE - COMPLETE 4+ VIEW  COMPARISON:  None.  FINDINGS: There is no fracture or effusion. No dislocation is evident. There is mild soft tissue swelling. Osteopenia is noted.  IMPRESSION: Negative for fracture or effusion.   Electronically Signed   By: Rolla Flatten M.D.   On: 02/11/2014 13:23   Dg C-arm 1-60 Min  02/11/2014   CLINICAL DATA:  Operative fixation of a right ankle trimalleolar fracture.  EXAM: DG C-ARM 61-120 MIN; RIGHT ANKLE - COMPLETE 3+ VIEW  COMPARISON:  Earlier today.  FINDINGS: Three C arm views of the right ankle demonstrate interval screw and plate fixation of the previously seen lateral malleolus fracture and screw fixation of the previously seen medial malleolus fracture. Anatomic position and alignment of the lateral malleolus fracture fragments and mild medial displacement of the medial fragment of the medial malleolus fracture. Previously noted posterior malleolus fracture with mild proximal displacement of the posterior fragment.  IMPRESSION:  Trimalleolar fracture with hardware fixation of the medial and lateral malleoli, as described above.   Electronically Signed   By: Enrique Sack M.D.   On: 02/11/2014 18:56    Disposition: skilled nursing facility      Discharge Instructions   Call MD / Call 911    Complete by:  As directed   If you experience chest pain or shortness of breath, CALL 911 and be transported to the hospital emergency room.  If you develope a fever above 101 F, pus (white drainage) or increased drainage or redness at the wound, or calf pain, call your surgeon's office.     Diet Carb Modified    Complete by:  As directed      Increase activity slowly as tolerated    Complete by:  As directed      Non weight bearing    Complete by:  As directed   Laterality:  right  Extremity:  Lower     Non weight bearing    Complete by:  As directed   Laterality:  right  Extremity:  Lower     Non weight bearing    Complete by:  As directed   Laterality:  right  Extremity:  Lower           Follow-up Information   Follow up with GRAVES,JOHN L, MD. Schedule an appointment as soon as possible for a visit in 2 weeks.   Specialty:  Orthopedic Surgery   Contact information:   [place] [address] [phone]        Signed: Erlene Senters 02/15/2014, 9:25 AM

## 2014-02-15 NOTE — Progress Notes (Signed)
Subjective: 4 Days Post-Op Procedure(s) (LRB): OPEN REDUCTION INTERNAL FIXATION (ORIF) ANKLE FRACTURE (Right) Patient reports pain as moderate.  Slow progress with physical therapy.  They have recommended skilled nursing facility.  Denies dizziness or lightheadedness.  Objective: Vital signs in last 24 hours: Temp:  [98.1 F (36.7 C)-98.9 F (37.2 C)] 98.1 F (36.7 C) (10/26 0539) Pulse Rate:  [76-86] 76 (10/26 0539) Resp:  [16-18] 18 (10/26 0539) BP: (139-157)/(41-62) 157/62 mmHg (10/26 0539) SpO2:  [92 %-96 %] 96 % (10/26 0827) Weight:  [99.247 kg (218 lb 12.8 oz)-101.969 kg (224 lb 12.8 oz)] 101.969 kg (224 lb 12.8 oz) (10/26 0539)  Intake/Output from previous day: 10/25 0701 - 10/26 0700 In: 1383 [P.O.:1380; I.V.:3] Out: 1001 [Urine:1000; Stool:1] Intake/Output this shift:     Recent Labs  02/13/14 0423 02/14/14 0415 02/15/14 0615  HGB 8.1* 7.8* 7.8*    Recent Labs  02/14/14 0415 02/15/14 0615  WBC 6.9 7.5  RBC 2.95* 2.87*  HCT 24.6* 24.2*  PLT 92* 100*    Recent Labs  02/13/14 0423  NA 145  K 4.7  CL 103  CO2 32  BUN 23  CREATININE 0.79  GLUCOSE 109*  CALCIUM 9.1   No results found for this basename: LABPT, INR,  in the last 72 hours Right ankle exam: Posterior splint is intact.  Normal sensation in toes.  Moves toes actively. The patient is alert and oriented.  Assessment/Plan: 4 Days Post-Op Procedure(s) (LRB): OPEN REDUCTION INTERNAL FIXATION (ORIF) ANKLE FRACTURE (Right) Plan: Continue physical therapy. Appreciate hospitalists help. Discharge to skilled nursing facility when bed available. Ambulate nonweightbearing on the right. Will need followup with Dr. Berenice Primas in 2 weeks.   [place] Al[place]ire G 02/15/2014, 8:58 AM

## 2014-02-15 NOTE — Progress Notes (Addendum)
Physical Therapy Treatment Patient Details Name: Brooke Mccullough MRN: [id number] DOB: [birth year] Today's Date: 02/15/2014    History of Present Illness 68 y.o. female with h/o L ankle fx, COPD on O2, DM, gout, IBS, back pain, fibromyalgia, CHF admitted with R ankle fx, s/p ORIF 02/11/14.     PT Comments    Making progress with functional mobility and transfers -- continue to endorse SNF as best option for post-acute rehab, and [facility] of course makes sense; Noted a bed won't be available until Wed, and the option has been put forth for pt to go to her apt with full-time nursing care until she can go to SNF;    See equipment recommendations below for if pt decides to go to her retirement apt for 2 days and then go to SNF  Pt required supplemental O2 to keep O2 sats at acceptable levels with activity   Follow Up Recommendations  SNF(is it worth considering HHPT to see pt in her apt before she transfers to SNF?)     Equipment Recommendations  RW, wheelchair with R elevating legrest, drop-arm 3in1 commode, Oxygen;   24 hour assist;  Ambulance transport [place] considering a hospital bed (though would need to weigh benefit of having one for 36 hours . . . )   Recommendations for Other Services OT consult     Precautions / Restrictions Precautions Precautions: Fall Precaution Comments: h/o fall  2 years ago during which she sustained a L ankle fx, one other fall 4 months ago related to overmedication Restrictions RLE Weight Bearing: Non weight bearing    Mobility  Bed Mobility Overal bed mobility: Needs Assistance Bed Mobility: Supine to Sit     Supine to sit: HOB elevated;Min guard     General bed mobility comments: Much improved ability to come to sit with use of bedrails; did not need physical assist  Transfers Overall transfer level: Needs assistance Equipment used: Rolling walker (2 wheeled) Transfers: Sit to/from Stand Sit to Stand: +2  safety/equipment;Min assist Stand pivot transfers: +2 physical assistance;Mod assist       General transfer comment: Much improved rise with need for min assit for safety/steadiness; More difficulty with pivot transfer, with noted some touchdown of R foot, and need for assist to control standi to sit; very fatigued after transfer training  Ambulation/Gait                 Stairs            Wheelchair Mobility    Modified Rankin (Stroke Patients Only)       Balance     Sitting balance-Leahy Scale: Good       Standing balance-Leahy Scale: Poor                      Cognition Arousal/Alertness: Awake/alert Behavior During Therapy: WFL for tasks assessed/performed;Anxious Overall Cognitive Status: Within Functional Limits for tasks assessed                      Exercises      General Comments        Pertinent Vitals/Pain Pain Assessment: 0-10 Pain Score: 9  Pain Location: R Ankle Pain Descriptors / Indicators: Aching;Sore Pain Intervention(s): Monitored during session;Repositioned    Home Living                      Prior Function  PT Goals (current goals can now be found in the care plan section) Acute Rehab PT Goals Patient Stated Goal: to walk PT Goal Formulation: With patient Time For Goal Achievement: 02/19/14 Potential to Achieve Goals: Fair Progress towards PT goals: Progressing toward goals    Frequency  Min 3X/week    PT Plan Current plan remains appropriate    Co-evaluation             End of Session Equipment Utilized During Treatment: Gait belt;Oxygen Activity Tolerance: Patient tolerated treatment well;Patient limited by fatigue;Patient limited by pain Patient left: in chair;with call bell/phone within reach     Time: 0923-0945 PT Time Calculation (min): 22 min  Charges:  $Therapeutic Activity: 8-22 mins                    G Codes:      Quin Hoop 02/15/2014,  1:41 PM  Roney Marion, [place] Pager [phone] Office [phone]

## 2014-02-15 NOTE — Progress Notes (Signed)
PROGRESS NOTE  Brooke Mccullough OBS:[id number] DOB: 04/08/1946 DOA: 02/11/2014 PCP: Tammi Sou, MD  HPI: Brooke Mccullough is a 68 y.o. female with multiple medical issues. She fell today and had pain in her right ankle. She usually walks with a walker. Not very active at home. Patient did not lose consciousness. No bowel or bladder incontinence. She wears 3.5L O2 at home. She follows with a pain clinic for her chronic pain. Her PCP took her off lasix BID to lasix daily about 1 month ago and patient had had gradual weight increase- 9lbs in 10 days  Subjective/ 24 H Interval events - ongoing RLE pain  Assessment/Plan: Closed right trimalleolar fracture - s/p ORIF 10/22  - dispo per ortho, medically OK to be discharged when bed available  COPD - stable- on 3.5L at home continuously, nebs PRN Morbid obesity  Diastolic heart failure - daily weights DM - SSI, continue lantus and novolog  CKD stage III - Cr within normal limits today  Thrombocytopenia - for the past 6 months, unclear etiology, stable, no active bleeding, closely monitor.  - improving ABLA post op - stable, no indication for transfusion today    Diet: carb modified Fluids: none  DVT Prophylaxis: Lovenox  Code Status: Full Family Communication: none  Disposition Plan: inpatient, SNF when ready   Consultants:  Orthopedic surgery   Procedures:  ORIF R ankle 10/22   Antibiotics  Anti-infectives   Start     Dose/Rate Route Frequency Ordered Stop   02/11/14 2200  clindamycin (CLEOCIN) IVPB 600 mg     600 mg 100 mL/hr over 30 Minutes Intravenous 3 times per day 02/11/14 2013/08/08 02/12/14 1420     Studies Filed Vitals:   02/14/14 August 09, 2010 02/15/14 0349 02/15/14 0539 02/15/14 0827  BP:   157/62   Pulse:   76   Temp:   98.1 F (36.7 C)   TempSrc:   Oral   Resp:   18   Height:      Weight:  100.699 kg (222 lb) 101.969 kg (224 lb 12.8 oz)   SpO2: 96%  92% 96%    Intake/Output Summary (Last 24 hours)  at 02/15/14 1008 Last data filed at 02/15/14 0541  Gross per 24 hour  Intake   1143 ml  Output    801 ml  Net    342 ml   Filed Weights   02/14/14 1804 02/15/14 0349 02/15/14 0539  Weight: 99.247 kg (218 lb 12.8 oz) 100.699 kg (222 lb) 101.969 kg (224 lb 12.8 oz)   Exam:  General:  NAD  Cardiovascular: RRR  Respiratory: CTA biL  Abdomen: soft, non tender  MSK: no edema  Neuro: non focal  Data Reviewed: Basic Metabolic Panel:  Recent Labs Lab 02/11/14 1150 02/12/14 0540 02/13/14 0423  NA 142 146 145  K 5.0 4.6 4.7  CL 99 102 103  CO2 34* 33* 32  GLUCOSE 176* 197* 109*  BUN 43* 27* 23  CREATININE 0.68 0.65 0.79  CALCIUM 9.5 9.2 9.1   CBC:  Recent Labs Lab 02/11/14 1150 02/12/14 0540 02/13/14 0423 02/14/14 0415 02/15/14 0615  WBC 8.5 5.9 6.0 6.9 7.5  NEUTROABS 7.1  --   --   --   --   HGB 10.0* 8.7* 8.1* 7.8* 7.8*  HCT 31.5* 28.2* 25.4* 24.6* 24.2*  MCV 84.5 84.7 83.8 83.4 84.3  PLT 94* 87* 91* 92* 100*   BNP (last 3 results)  Recent Labs  09/28/13 1304 10/07/13 1908  PROBNP 356.8* 919.7*   CBG:  Recent Labs Lab 02/14/14 0656 02/14/14 1122 02/14/14 1626 02/14/14 2123 02/15/14 0642  GLUCAP 121* 217* 138* 135* 123*   Studies: Dg Ankle Complete Right February 18, 2014 Trimalleolar fracture with hardware fixation of the medial and lateral malleoli, as described above.  Dg Ankle Complete Right 02-18-14 Trimalleolar fracture. Marked soft tissue swelling. Disruption of the ankle mortise.    Dg Chest Portable 1 View 18-Feb-2014 Mild right lower lobe subsegmental atelectasis or pneumonia is noted. Followup radiographs are recommended.  Dg Knee Complete 4 Views Right 02-18-2014 Negative for fracture or effusion.   Electronically Signed   By: Rolla Flatten M.D.   On: Feb 18, 2014 13:23   Dg C-arm 1-60 Min 02/18/14 Trimalleolar fracture with hardware fixation of the medial and lateral malleoli, as described above.    Scheduled Meds: . albuterol  2.5  mg Nebulization TID  . antiseptic oral rinse  7 mL Mouth Rinse BID  . aspirin EC  81 mg Oral Daily  . atorvastatin  40 mg Oral Daily  . calcium gluconate  2 tablet Oral Daily  . citalopram  40 mg Oral Daily  . clopidogrel  75 mg Oral Daily  . enoxaparin (LOVENOX) injection  40 mg Subcutaneous Q24H  . furosemide  40 mg Oral Daily  . insulin aspart  0-15 Units Subcutaneous TID WC  . insulin aspart  0-5 Units Subcutaneous QHS  . insulin aspart  6 Units Subcutaneous TID AC  . insulin glargine  40 Units Subcutaneous BID  . levothyroxine  100 mcg Oral QAC breakfast  . lisinopril  10 mg Oral Daily  . magnesium oxide  400 mg Oral BID  . morphine  15 mg Oral q morning - 10a   And  . morphine  30 mg Oral QHS  . multivitamin with minerals  1 tablet Oral Daily  . pantoprazole  80 mg Oral Daily  . potassium chloride SA  20 mEq Oral Daily  . pregabalin  150 mg Oral BID  . rOPINIRole  2 mg Oral QHS  . senna-docusate  1 tablet Oral BID  . sodium chloride  3 mL Intravenous Q12H  . tiotropium  18 mcg Inhalation Daily   Continuous Infusions: . lactated ringers 75 mL/hr at 18-Feb-2014 2135    Active Problems:   Diabetes   Chronic pain syndrome   Multinodular goiter (nontoxic)   Chronic renal insufficiency, stage III (moderate)   Hypothyroidism   HTN (hypertension), benign   Closed right trimalleolar fracture   COPD (chronic obstructive pulmonary disease)   Time spent: [place], MD [facility] Pager [phone]. If 7 PM - 7 AM, please contact night-coverage at [email or web address], password [facility] 02/15/2014, 10:08 AM  LOS: 4 days

## 2014-02-16 LAB — GLUCOSE, CAPILLARY
GLUCOSE-CAPILLARY: 134 mg/dL — AB (ref 70–99)
Glucose-Capillary: 114 mg/dL — ABNORMAL HIGH (ref 70–99)

## 2014-02-16 MED ORDER — OXYCODONE-ACETAMINOPHEN 5-325 MG PO TABS
1.0000 | ORAL_TABLET | Freq: Four times a day (QID) | ORAL | Status: DC | PRN
  Administered 2014-02-16: 2 via ORAL
  Filled 2014-02-16: qty 2

## 2014-02-16 NOTE — Clinical Social Work Placement (Signed)
Clinical Social Work Department CLINICAL SOCIAL WORK PLACEMENT NOTE 02/16/2014  Patient:  Brooke Mccullough, Brooke Mccullough  Account Number:  [id number] Admit date:  02/11/2014  Clinical Social Worker:  Wylene Men  Date/time:  02/15/2014 11:06 AM  Clinical Social Work is seeking post-discharge placement for this patient at the following level of care:   [place]   (*CSW will update this form in Epic as items are completed)   02/15/2014  Patient/family provided with [place] Department of Clinical Social Work's list of facilities offering this level of care within the geographic area requested by the patient (or if unable, by the patient's family).  02/15/2014  Patient/family informed of their freedom to choose among providers that offer the needed level of care, that participate in Medicare, Medicaid or managed care program needed by the patient, have an available bed and are willing to accept the patient.  02/15/2014  Patient/family informed of [facility]' ownership interest in [facility], as well as of the fact that they are under no obligation to receive care at this facility.  PASARR submitted to EDS on 02/15/2014 PASARR number received on 02/15/2014  FL2 transmitted to all facilities in geographic area requested by pt/family on  02/15/2014 FL2 transmitted to all facilities within larger geographic area on 02/15/2014  Patient informed that his/her managed care company has contracts with or will negotiate with  certain facilities, including the following:     Patient/family informed of bed offers received:  02/15/2014 Patient chooses bed at [place] Physician recommends and patient chooses bed at    Patient to be transferred to [place] on  02/16/2014 Patient to be transferred to facility by [facility] Patient and family notified of transfer on 02/16/2014 Name of family member notified:  pt is alert and oriented  x4  The following physician request were entered in Epic:   Additional Comments: Pt will dc to [facility] with intent to transfer to [facility] once a bed is available.  Admissions coordinator, pt and family is aware and agreeable to this disposition.   Nonnie Done, [place] [phone]  Psychiatric & Orthopedics (5N 1-16) Clinical Social Worker

## 2014-02-16 NOTE — Progress Notes (Signed)
PROGRESS NOTE  Brooke Mccullough LTJ:[id number] DOB: Jul 25, 1945 DOA: 02/11/2014 PCP: Tammi Sou, MD  HPI: Brooke Mccullough is a 68 y.o. female with multiple medical issues. She fell today and had pain in her right ankle. She usually walks with a walker. Not very active at home. Patient did not lose consciousness. No bowel or bladder incontinence. She wears 3.5L O2 at home. She follows with a pain clinic for her chronic pain. Her PCP took her off lasix BID to lasix daily about 1 month ago and patient had had gradual weight increase- 9lbs in 10 days  Subjective/ 24 H Interval events - feels better this morning  Assessment/Plan: Closed right trimalleolar fracture - s/p ORIF 10/22  - dispo per ortho, medically OK to be discharged when bed available  COPD - stable- on 3.5L at home continuously, nebs PRN Morbid obesity  Diastolic heart failure - daily weights DM - SSI, continue lantus and novolog  CKD stage III - Cr within normal limits today  Thrombocytopenia - for the past 6 months, unclear etiology, stable, no active bleeding, closely monitor.  - improving ABLA post op - stable, no indication for transfusion today    Diet: carb modified Fluids: none  DVT Prophylaxis: Lovenox  Code Status: Full Family Communication: none  Disposition Plan: inpatient, SNF when ready   Consultants:  Orthopedic surgery   Procedures:  ORIF R ankle 10/22   Antibiotics  Anti-infectives   Start     Dose/Rate Route Frequency Ordered Stop   02/11/14 2200  clindamycin (CLEOCIN) IVPB 600 mg     600 mg 100 mL/hr over 30 Minutes Intravenous 3 times per day 02/11/14 2013/08/16 02/12/14 1420     Studies Filed Vitals:   02/15/14 2033/08/16 02/16/14 0500 02/16/14 0600 02/16/14 0959  BP: 150/57  146/46   Pulse: 72  79   Temp: 98.4 F (36.9 C)  98.5 F (36.9 C)   TempSrc:      Resp: 18  18   Height:      Weight:  100.699 kg (222 lb)    SpO2: 98%  99% 97%    Intake/Output Summary (Last 24  hours) at 02/16/14 1047 Last data filed at 02/16/14 0943  Gross per 24 hour  Intake    266 ml  Output      0 ml  Net    266 ml   Filed Weights   02/15/14 0349 02/15/14 0539 02/16/14 0500  Weight: 100.699 kg (222 lb) 101.969 kg (224 lb 12.8 oz) 100.699 kg (222 lb)   Exam:  General:  NAD  Cardiovascular: RRR  Respiratory: CTA biL  Abdomen: soft, non tender  MSK: no edema  Neuro: non focal  Data Reviewed: Basic Metabolic Panel:  Recent Labs Lab 02/11/14 1150 02/12/14 0540 02/13/14 0423  NA 142 146 145  K 5.0 4.6 4.7  CL 99 102 103  CO2 34* 33* 32  GLUCOSE 176* 197* 109*  BUN 43* 27* 23  CREATININE 0.68 0.65 0.79  CALCIUM 9.5 9.2 9.1   CBC:  Recent Labs Lab 02/11/14 1150 02/12/14 0540 02/13/14 0423 02/14/14 0415 02/15/14 0615  WBC 8.5 5.9 6.0 6.9 7.5  NEUTROABS 7.1  --   --   --   --   HGB 10.0* 8.7* 8.1* 7.8* 7.8*  HCT 31.5* 28.2* 25.4* 24.6* 24.2*  MCV 84.5 84.7 83.8 83.4 84.3  PLT 94* 87* 91* 92* 100*   BNP (last 3 results)  Recent Labs  09/28/13 1304 10/07/13 1908  PROBNP 356.8* 919.7*   CBG:  Recent Labs Lab 02/15/14 0642 02/15/14 1223 02/15/14 1658 02/15/14 2133 02/16/14 0645  GLUCAP 123* 213* 110* 139* 134*   Studies: Dg Ankle Complete Right 02/13/14 Trimalleolar fracture with hardware fixation of the medial and lateral malleoli, as described above.  Dg Ankle Complete Right February 13, 2014 Trimalleolar fracture. Marked soft tissue swelling. Disruption of the ankle mortise.    Dg Chest Portable 1 View February 13, 2014 Mild right lower lobe subsegmental atelectasis or pneumonia is noted. Followup radiographs are recommended.  Dg Knee Complete 4 Views Right 02/13/2014 Negative for fracture or effusion.   Electronically Signed   By: Rolla Flatten M.D.   On: 2014/02/13 13:23   Dg C-arm 1-60 Min 2014/02/13 Trimalleolar fracture with hardware fixation of the medial and lateral malleoli, as described above.    Scheduled Meds: . albuterol  2.5  mg Nebulization TID  . antiseptic oral rinse  7 mL Mouth Rinse BID  . aspirin EC  81 mg Oral Daily  . atorvastatin  40 mg Oral Daily  . calcium gluconate  2 tablet Oral Daily  . citalopram  40 mg Oral Daily  . clopidogrel  75 mg Oral Daily  . enoxaparin (LOVENOX) injection  40 mg Subcutaneous Q24H  . furosemide  40 mg Oral Daily  . insulin aspart  0-15 Units Subcutaneous TID WC  . insulin aspart  0-5 Units Subcutaneous QHS  . insulin aspart  6 Units Subcutaneous TID AC  . insulin glargine  40 Units Subcutaneous BID  . levothyroxine  100 mcg Oral QAC breakfast  . lisinopril  10 mg Oral Daily  . magnesium oxide  400 mg Oral BID  . morphine  15 mg Oral q morning - 10a   And  . morphine  30 mg Oral QHS  . multivitamin with minerals  1 tablet Oral Daily  . pantoprazole  80 mg Oral Daily  . potassium chloride SA  20 mEq Oral Daily  . pregabalin  150 mg Oral BID  . rOPINIRole  2 mg Oral QHS  . senna-docusate  1 tablet Oral BID  . sodium chloride  3 mL Intravenous Q12H  . tiotropium  18 mcg Inhalation Daily   Continuous Infusions: . lactated ringers 75 mL/hr at 13-Feb-2014 2135    Active Problems:   Diabetes   Chronic pain syndrome   Multinodular goiter (nontoxic)   Chronic renal insufficiency, stage III (moderate)   Hypothyroidism   HTN (hypertension), benign   Closed right trimalleolar fracture   COPD (chronic obstructive pulmonary disease)   Time spent: [place], MD [facility] Pager [phone]. If 7 PM - 7 AM, please contact night-coverage at [email or web address], password [facility] 02/16/2014, 10:47 AM  LOS: 5 days

## 2014-02-16 NOTE — Clinical Social Work Note (Addendum)
Pt to dc to [facility] today RN to call report to: [phone] Transportation: [facility]- scheduled  CSW spoke with pt re: bed offers for possible admission today and transfer to [facility] once a bed is available.  This plan was discussed with Janie at [facility] (admissions) and all are agreeable.  Pt to be transported via [facility].  RN updated and aware.  CSW spoke with [facility] Stanton Kidney, Director of Nursing) who reports patient's bed may not be available until later in the week and are not certain that a bed will be available at that time. [facility] SNF has agreed to accept pt with the intention of pt transferred to [facility] once a bed becomes available.  Mary at [facility] states she will contact the patient and the patient's son to discuss the lack of bed availability.  CSW will review options with patient once Stanton Kidney [facility]) has spoken with the patient/family.  MD has completed dc summary and pt is medically ready to discharge to SNF to start STR.  Nonnie Done, [place] [phone]  Psychiatric & Orthopedics (5N 1-16) Clinical Social Worker

## 2014-02-16 NOTE — Progress Notes (Signed)
Subjective: 5 Days Post-Op Procedure(s) (LRB): OPEN REDUCTION INTERNAL FIXATION (ORIF) ANKLE FRACTURE (Right) Patient reports pain as mild.  Awaiting skilled nursing facility at her facility in [place]. Apparently there is not a bed available until either today or tomorrow.  Objective: Vital signs in last 24 hours: Temp:  [98.4 F (36.9 C)-98.7 F (37.1 C)] 98.5 F (36.9 C) (10/27 0600) Pulse Rate:  [72-79] 79 (10/27 0600) Resp:  [18-20] 18 (10/27 0600) BP: (145-150)/(46-57) 146/46 mmHg (10/27 0600) SpO2:  [87 %-99 %] 99 % (10/27 0600) Weight:  [100.699 kg (222 lb)] 100.699 kg (222 lb) (10/27 0500)  Intake/Output from previous day: 10/26 0701 - 10/27 0700 In: 503 [P.O.:500; I.V.:3] Out: -  Intake/Output this shift:     Recent Labs  02/14/14 0415 02/15/14 0615  HGB 7.8* 7.8*    Recent Labs  02/14/14 0415 02/15/14 0615  WBC 6.9 7.5  RBC 2.95* 2.87*  HCT 24.6* 24.2*  PLT 92* 100*   No results found for this basename: NA, K, CL, CO2, BUN, CREATININE, GLUCOSE, CALCIUM,  in the last 72 hours No results found for this basename: LABPT, INR,  in the last 72 hours Right lower extremity exam: Posterior splint is intact. Normal sensation in toes. Moves toes actually. Splint clean and dry.  Assessment/Plan: 5 Days Post-Op Procedure(s) (LRB): OPEN REDUCTION INTERNAL FIXATION (ORIF) ANKLE FRACTURE (Right) Plan: Discharge to skilled nursing facility when bed available. Continue physical therapy in the meantime. Once discharged will follow-up with Dr. Berenice Primas in 2 weeks.   Zafir Schauer G 02/16/2014, 8:30 AM

## 2014-02-16 NOTE — Progress Notes (Signed)
Physical Therapy Treatment Patient Details Name: Brooke Mccullough MRN: [id number] DOB: May 25, 1945 Today's Date: 02/16/2014    History of Present Illness 68 y.o. female with h/o L ankle fx, COPD on O2, DM, gout, IBS, back pain, fibromyalgia, CHF admitted with R ankle fx, s/p ORIF 02/11/14.     PT Comments    Pt cont to demo SOB with activity. Pt very anxious regarding mobility with NWB status. 2 person (A) for transfers at this time. Pt is agreeable to SNF for post acute rehab.   Follow Up Recommendations  SNF     Equipment Recommendations  Other (comment)    Recommendations for Other Services       Precautions / Restrictions Precautions Precautions: Fall Precaution Comments: h/o fall  2 years ago during which she sustained a L ankle fx, one other fall 4 months ago related to overmedication Restrictions Weight Bearing Restrictions: Yes RLE Weight Bearing: Non weight bearing    Mobility  Bed Mobility Overal bed mobility: Modified Independent Bed Mobility: Supine to Sit     Supine to sit: HOB elevated;Modified independent (Device/Increase time)     General bed mobility comments: pt relying on handrails and HOB elevated; no physical (A) needed  Transfers Overall transfer level: Needs assistance Equipment used: Rolling walker (2 wheeled) Transfers: Stand Pivot Transfers;Sit to/from Stand Sit to Stand: Mod assist;+2 physical assistance Stand pivot transfers: Max assist;+2 physical assistance       General transfer comment: pt very anxious and unsteady with transfers; attempting to sit prior to reaching chair; max (A) to maintain NWB status and max cues for safety   Ambulation/Gait             General Gait Details: pivotal steps only   Stairs            Wheelchair Mobility    Modified Rankin (Stroke Patients Only)       Balance Overall balance assessment: History of Falls;Needs assistance Sitting-balance support: No upper extremity  supported;Feet unsupported Sitting balance-Leahy Scale: Good     Standing balance support: During functional activity;Bilateral upper extremity supported Standing balance-Leahy Scale: Zero Standing balance comment: requries 2 person (A) and RW to maintain balance and NWb status                     Cognition Arousal/Alertness: Awake/alert Behavior During Therapy: WFL for tasks assessed/performed;Anxious Overall Cognitive Status: Within Functional Limits for tasks assessed                      Exercises      General Comments General comments (skin integrity, edema, etc.): cued on deep breathing exercises       Pertinent Vitals/Pain Pain Assessment: 0-10 Pain Score: 9  Pain Location: Rt ankle  Pain Descriptors / Indicators: Burning;Tingling Pain Intervention(s): Monitored during session;Premedicated before session;Repositioned    Home Living                      Prior Function            PT Goals (current goals can now be found in the care plan section) Acute Rehab PT Goals Patient Stated Goal: to leave tomorrow  PT Goal Formulation: With patient Time For Goal Achievement: 02/19/14 Potential to Achieve Goals: Fair Progress towards PT goals: Progressing toward goals    Frequency  Min 3X/week    PT Plan Current plan remains appropriate    Co-evaluation  End of Session Equipment Utilized During Treatment: Gait belt;Oxygen Activity Tolerance: Patient limited by fatigue;Patient limited by pain Patient left: in chair;with call bell/phone within reach;with family/visitor present     Time: 1005-1017 PT Time Calculation (min): 12 min  Charges:  $Therapeutic Activity: 8-22 mins                    G CodesGustavus Bryant, [place] 02/16/2014, 11:19 AM

## 2014-02-16 NOTE — Progress Notes (Signed)
Patient discharged to [facility], SNF. Report called to Marlis Edelson, RN at facility. IV was removed and patient left unit in a stable condition with all personal belongings via EMS.

## 2014-02-17 ENCOUNTER — Encounter: Payer: Self-pay | Admitting: Family Medicine

## 2014-02-19 ENCOUNTER — Ambulatory Visit: Payer: Medicare Other | Admitting: Family Medicine

## 2014-02-22 ENCOUNTER — Encounter: Payer: Self-pay | Admitting: Family Medicine

## 2014-03-04 ENCOUNTER — Other Ambulatory Visit: Payer: Self-pay | Admitting: Family Medicine

## 2014-03-23 HISTORY — PX: TRANSTHORACIC ECHOCARDIOGRAM: SHX275

## 2014-04-15 ENCOUNTER — Other Ambulatory Visit: Payer: Self-pay | Admitting: Family Medicine

## 2014-04-15 NOTE — Telephone Encounter (Signed)
Refill request for xanax.  Patient last Rx was 10/27/13 x 5 rfs.  Please advise.

## 2014-04-16 ENCOUNTER — Inpatient Hospital Stay (HOSPITAL_COMMUNITY)
Admission: EM | Admit: 2014-04-16 | Discharge: 2014-04-29 | DRG: 189 | Disposition: A | Discharge status: Skilled Nursing Facility | Payer: Medicare Other | Attending: Internal Medicine | Admitting: Internal Medicine

## 2014-04-16 ENCOUNTER — Encounter (HOSPITAL_COMMUNITY): Payer: Self-pay | Admitting: *Deleted

## 2014-04-16 ENCOUNTER — Emergency Department (HOSPITAL_COMMUNITY): Payer: Medicare Other

## 2014-04-16 DIAGNOSIS — D649 Anemia, unspecified: Secondary | ICD-10-CM | POA: Insufficient documentation

## 2014-04-16 DIAGNOSIS — K625 Hemorrhage of anus and rectum: Secondary | ICD-10-CM | POA: Diagnosis not present

## 2014-04-16 DIAGNOSIS — Z7951 Long term (current) use of inhaled steroids: Secondary | ICD-10-CM

## 2014-04-16 DIAGNOSIS — Z8673 Personal history of transient ischemic attack (TIA), and cerebral infarction without residual deficits: Secondary | ICD-10-CM

## 2014-04-16 DIAGNOSIS — B349 Viral infection, unspecified: Secondary | ICD-10-CM | POA: Insufficient documentation

## 2014-04-16 DIAGNOSIS — E039 Hypothyroidism, unspecified: Secondary | ICD-10-CM | POA: Diagnosis present

## 2014-04-16 DIAGNOSIS — I5189 Other ill-defined heart diseases: Secondary | ICD-10-CM

## 2014-04-16 DIAGNOSIS — J810 Acute pulmonary edema: Secondary | ICD-10-CM | POA: Insufficient documentation

## 2014-04-16 DIAGNOSIS — G9341 Metabolic encephalopathy: Secondary | ICD-10-CM

## 2014-04-16 DIAGNOSIS — J440 Chronic obstructive pulmonary disease with acute lower respiratory infection: Secondary | ICD-10-CM | POA: Diagnosis present

## 2014-04-16 DIAGNOSIS — I10 Essential (primary) hypertension: Secondary | ICD-10-CM | POA: Diagnosis present

## 2014-04-16 DIAGNOSIS — R609 Edema, unspecified: Secondary | ICD-10-CM

## 2014-04-16 DIAGNOSIS — J9621 Acute and chronic respiratory failure with hypoxia: Secondary | ICD-10-CM | POA: Diagnosis not present

## 2014-04-16 DIAGNOSIS — Z8711 Personal history of peptic ulcer disease: Secondary | ICD-10-CM

## 2014-04-16 DIAGNOSIS — R778 Other specified abnormalities of plasma proteins: Secondary | ICD-10-CM | POA: Insufficient documentation

## 2014-04-16 DIAGNOSIS — R410 Disorientation, unspecified: Secondary | ICD-10-CM

## 2014-04-16 DIAGNOSIS — R7401 Elevation of levels of liver transaminase levels: Secondary | ICD-10-CM

## 2014-04-16 DIAGNOSIS — S82891A Other fracture of right lower leg, initial encounter for closed fracture: Secondary | ICD-10-CM

## 2014-04-16 DIAGNOSIS — M797 Fibromyalgia: Secondary | ICD-10-CM

## 2014-04-16 DIAGNOSIS — J189 Pneumonia, unspecified organism: Secondary | ICD-10-CM

## 2014-04-16 DIAGNOSIS — J438 Other emphysema: Secondary | ICD-10-CM

## 2014-04-16 DIAGNOSIS — I5031 Acute diastolic (congestive) heart failure: Secondary | ICD-10-CM | POA: Insufficient documentation

## 2014-04-16 DIAGNOSIS — R234 Changes in skin texture: Secondary | ICD-10-CM

## 2014-04-16 DIAGNOSIS — J441 Chronic obstructive pulmonary disease with (acute) exacerbation: Secondary | ICD-10-CM | POA: Diagnosis present

## 2014-04-16 DIAGNOSIS — R809 Proteinuria, unspecified: Secondary | ICD-10-CM

## 2014-04-16 DIAGNOSIS — R7989 Other specified abnormal findings of blood chemistry: Secondary | ICD-10-CM

## 2014-04-16 DIAGNOSIS — J069 Acute upper respiratory infection, unspecified: Secondary | ICD-10-CM | POA: Diagnosis present

## 2014-04-16 DIAGNOSIS — I5033 Acute on chronic diastolic (congestive) heart failure: Secondary | ICD-10-CM | POA: Insufficient documentation

## 2014-04-16 DIAGNOSIS — G894 Chronic pain syndrome: Secondary | ICD-10-CM | POA: Diagnosis present

## 2014-04-16 DIAGNOSIS — J9601 Acute respiratory failure with hypoxia: Secondary | ICD-10-CM

## 2014-04-16 DIAGNOSIS — J449 Chronic obstructive pulmonary disease, unspecified: Secondary | ICD-10-CM | POA: Diagnosis present

## 2014-04-16 DIAGNOSIS — E1165 Type 2 diabetes mellitus with hyperglycemia: Secondary | ICD-10-CM | POA: Diagnosis present

## 2014-04-16 DIAGNOSIS — R0602 Shortness of breath: Secondary | ICD-10-CM | POA: Diagnosis not present

## 2014-04-16 DIAGNOSIS — R112 Nausea with vomiting, unspecified: Secondary | ICD-10-CM

## 2014-04-16 DIAGNOSIS — E1121 Type 2 diabetes mellitus with diabetic nephropathy: Secondary | ICD-10-CM

## 2014-04-16 DIAGNOSIS — E042 Nontoxic multinodular goiter: Secondary | ICD-10-CM

## 2014-04-16 DIAGNOSIS — Z1389 Encounter for screening for other disorder: Secondary | ICD-10-CM

## 2014-04-16 DIAGNOSIS — J111 Influenza due to unidentified influenza virus with other respiratory manifestations: Secondary | ICD-10-CM | POA: Diagnosis present

## 2014-04-16 DIAGNOSIS — B359 Dermatophytosis, unspecified: Secondary | ICD-10-CM | POA: Diagnosis present

## 2014-04-16 DIAGNOSIS — N183 Chronic kidney disease, stage 3 unspecified: Secondary | ICD-10-CM

## 2014-04-16 DIAGNOSIS — E1129 Type 2 diabetes mellitus with other diabetic kidney complication: Secondary | ICD-10-CM

## 2014-04-16 DIAGNOSIS — Z9981 Dependence on supplemental oxygen: Secondary | ICD-10-CM

## 2014-04-16 DIAGNOSIS — S82899A Other fracture of unspecified lower leg, initial encounter for closed fracture: Secondary | ICD-10-CM

## 2014-04-16 DIAGNOSIS — Z853 Personal history of malignant neoplasm of breast: Secondary | ICD-10-CM

## 2014-04-16 DIAGNOSIS — E785 Hyperlipidemia, unspecified: Secondary | ICD-10-CM | POA: Diagnosis present

## 2014-04-16 DIAGNOSIS — Z0000 Encounter for general adult medical examination without abnormal findings: Secondary | ICD-10-CM

## 2014-04-16 DIAGNOSIS — I429 Cardiomyopathy, unspecified: Secondary | ICD-10-CM | POA: Diagnosis present

## 2014-04-16 DIAGNOSIS — D696 Thrombocytopenia, unspecified: Secondary | ICD-10-CM | POA: Diagnosis present

## 2014-04-16 DIAGNOSIS — J8489 Other specified interstitial pulmonary diseases: Secondary | ICD-10-CM | POA: Diagnosis present

## 2014-04-16 DIAGNOSIS — K219 Gastro-esophageal reflux disease without esophagitis: Secondary | ICD-10-CM | POA: Diagnosis present

## 2014-04-16 DIAGNOSIS — Z794 Long term (current) use of insulin: Secondary | ICD-10-CM

## 2014-04-16 DIAGNOSIS — R059 Cough, unspecified: Secondary | ICD-10-CM

## 2014-04-16 DIAGNOSIS — R05 Cough: Secondary | ICD-10-CM

## 2014-04-16 DIAGNOSIS — E119 Type 2 diabetes mellitus without complications: Secondary | ICD-10-CM

## 2014-04-16 DIAGNOSIS — Z87891 Personal history of nicotine dependence: Secondary | ICD-10-CM

## 2014-04-16 DIAGNOSIS — R740 Nonspecific elevation of levels of transaminase and lactic acid dehydrogenase [LDH]: Secondary | ICD-10-CM

## 2014-04-16 LAB — URINALYSIS, ROUTINE W REFLEX MICROSCOPIC
BILIRUBIN URINE: NEGATIVE
GLUCOSE, UA: NEGATIVE mg/dL
Hgb urine dipstick: NEGATIVE
Ketones, ur: NEGATIVE mg/dL
Leukocytes, UA: NEGATIVE
Nitrite: NEGATIVE
PH: 6.5 (ref 5.0–8.0)
PROTEIN: 30 mg/dL — AB
Specific Gravity, Urine: 1.013 (ref 1.005–1.030)
Urobilinogen, UA: 0.2 mg/dL (ref 0.0–1.0)

## 2014-04-16 LAB — COMPREHENSIVE METABOLIC PANEL
ALBUMIN: 3.1 g/dL — AB (ref 3.5–5.2)
ALK PHOS: 82 U/L (ref 39–117)
ALT: 13 U/L (ref 0–35)
AST: 19 U/L (ref 0–37)
Anion gap: 11 (ref 5–15)
BILIRUBIN TOTAL: 0.4 mg/dL (ref 0.3–1.2)
BUN: 36 mg/dL — ABNORMAL HIGH (ref 6–23)
CHLORIDE: 98 meq/L (ref 96–112)
CO2: 28 mmol/L (ref 19–32)
CREATININE: 0.91 mg/dL (ref 0.50–1.10)
Calcium: 8.7 mg/dL (ref 8.4–10.5)
GFR calc Af Amer: 73 mL/min — ABNORMAL LOW (ref >90)
GFR calc non Af Amer: 63 mL/min — ABNORMAL LOW (ref >90)
Glucose, Bld: 169 mg/dL — ABNORMAL HIGH (ref 70–99)
POTASSIUM: 4.7 mmol/L (ref 3.5–5.1)
Sodium: 137 mmol/L (ref 135–145)
Total Protein: 6.2 g/dL (ref 6.0–8.3)

## 2014-04-16 LAB — CBC WITH DIFFERENTIAL/PLATELET
BASOS ABS: 0 10*3/uL (ref 0.0–0.1)
Basophils Relative: 0 % (ref 0–1)
Eosinophils Absolute: 0 10*3/uL (ref 0.0–0.7)
Eosinophils Relative: 0 % (ref 0–5)
HCT: 29.7 % — ABNORMAL LOW (ref 36.0–46.0)
Hemoglobin: 9.4 g/dL — ABNORMAL LOW (ref 12.0–15.0)
LYMPHS PCT: 6 % — AB (ref 12–46)
Lymphs Abs: 0.6 10*3/uL — ABNORMAL LOW (ref 0.7–4.0)
MCH: 26.2 pg (ref 26.0–34.0)
MCHC: 31.6 g/dL (ref 30.0–36.0)
MCV: 82.7 fL (ref 78.0–100.0)
Monocytes Absolute: 0.5 10*3/uL (ref 0.1–1.0)
Monocytes Relative: 5 % (ref 3–12)
NEUTROS ABS: 9 10*3/uL — AB (ref 1.7–7.7)
NEUTROS PCT: 89 % — AB (ref 43–77)
PLATELETS: 118 10*3/uL — AB (ref 150–400)
RBC: 3.59 MIL/uL — ABNORMAL LOW (ref 3.87–5.11)
RDW: 14.9 % (ref 11.5–15.5)
WBC: 10.2 10*3/uL (ref 4.0–10.5)

## 2014-04-16 LAB — URINE MICROSCOPIC-ADD ON

## 2014-04-16 LAB — I-STAT CG4 LACTIC ACID, ED: Lactic Acid, Venous: 1.11 mmol/L (ref 0.5–2.2)

## 2014-04-16 LAB — BRAIN NATRIURETIC PEPTIDE: B Natriuretic Peptide: 225.8 pg/mL — ABNORMAL HIGH (ref 0.0–100.0)

## 2014-04-16 LAB — TROPONIN I: TROPONIN I: 0.18 ng/mL — AB (ref <0.031)

## 2014-04-16 MED ORDER — ASPIRIN 325 MG PO TABS
325.0000 mg | ORAL_TABLET | Freq: Once | ORAL | Status: AC
  Administered 2014-04-16: 325 mg via ORAL
  Filled 2014-04-16: qty 1

## 2014-04-16 NOTE — ED Notes (Signed)
Attempted report x1. 

## 2014-04-16 NOTE — ED Notes (Signed)
Pt to ED via [facility] from [facility] for c/o flu like symptoms and NV. Pt has not been feeling well x a few days; started running a fever today; temp 101.4 axillary. Husband has had the flu and been around patient. Pt given tylenol at facility at unknown time. Pt also given Tamiflu. EMS gave 4mg  zofran enroute for nausea

## 2014-04-16 NOTE — ED Notes (Signed)
Pt from SNF c/o fever, bodyaches, and flu like symptoms. Husband recently diagnosed with influenza. Pt lethargic, yet easy to arouse. Pt also reports increased urination today. Pt has generalized edema. Rales noted. Pt is always on 3L oxygen via [place] at home; Hx of COPD.

## 2014-04-16 NOTE — ED Notes (Signed)
Pt taken to xray 

## 2014-04-16 NOTE — ED Provider Notes (Signed)
CSN: [id number]     Arrival date & time 04/16/14  2025 History   First MD Initiated Contact with Patient 04/16/14 2025     Chief Complaint  Patient presents with  . Influenza     (Consider location/radiation/quality/duration/timing/severity/associated sxs/prior Treatment) Patient is a 68 y.o. female presenting with flu symptoms.  Influenza Presenting symptoms: cough, fever (tmax 101.4), nausea and vomiting   Presenting symptoms: no diarrhea and no fatigue   Cough:    Cough characteristics:  Productive   Sputum characteristics:  Nondescript   Severity:  Moderate   Onset quality:  Gradual   Duration:  1 day   Timing:  Constant   Progression:  Unchanged   Past Medical History  Diagnosis Date  . COPD (chronic obstructive pulmonary disease)     nocturnal oxygen, plus daytime use with activity  . Fibromyalgia     Dx'd 1993.  This has impaired her significantly.  . IBS (irritable bowel syndrome)   . Edema   . Gallstones     asymptomatic per pt (as of 09/30/2013); mild chronic elevation of Alk phos (remainder of hepatic panel wnl).  . Hernia of abdominal wall   . Hypertension   . Hyperlipidemia   . On home oxygen therapy     "3.5L @ night and during day prn" (09/28/2013)  . History of pneumonia     "once"  . Adrenal benign tumor   . Type II diabetes mellitus     hx of good control per pt report + HbA1c <7% June 2015.  Marland Kitchen Anemia 2015    Baseline Hb 11, normocytic.  Marland Kitchen GERD (gastroesophageal reflux disease)   . History of duodenal ulcer   . Daily headache   . Migraine     "@ least monthly" (09/28/2013)  . TIA (transient ischemic attack) 2012; 2014    left facial and left arm numbness  . Degenerative disc disease     Cervical and lumbar  . Osteoarthritis (arthritis due to wear and tear of joints)     "back; knees; elbows; left ankle" (09/28/2013)  . Chronic lower back pain     "L5-S1" (09/28/2013)  . Gout   . Anxiety   . Depression   . Breast cancer 2008    "right".   Lumpectomy + radiation+aromatase inhibitor (femara).  Regular f/u and mammos normal.  . Tremor     "upper extremities; last 6-7 months" (09/28/2013)  . Unspecified hypothyroidism 12/21/2013  . Multinodular goiter summer 2015    FNA 16/0/73 "Follicular lesion of undetermined significance": a follicular lesion/neoplasm can not be ruled out--Dr. Cruzita Lederer recommended repeat bx in 6-12 mo  . Closed right trimalleolar fracture 01/2014    Surgery   Past Surgical History  Procedure Laterality Date  . Lumbar laminectomy  1991; 1996  . Ankle fracture surgery Left 08/2011    Bimalleolar fx  . Total abdominal hysterectomy w/ bilateral salpingoophorectomy  1990  . Breast biopsy Right 2008  . Breast lumpectomy Right 2008  . Cataract extraction w/ intraocular lens implant Right 08/2013  . Cardiac catheterization  2000's X 2  . Bladder tack    . Orif ankle fracture Right 02/11/2014    Procedure: OPEN REDUCTION INTERNAL FIXATION (ORIF) ANKLE FRACTURE;  Surgeon: Alta Corning, MD;  Location: [facility];  Service: Orthopedics;  Laterality: Right;   Family History  Problem Relation Age of Onset  . Arthritis Mother   . Breast cancer Mother   . Hyperlipidemia Mother   . Stroke Mother   .  Hypertension Mother   . Mental illness Mother   . Arthritis Father   . Cancer Father   . Hyperlipidemia Father   . Heart disease Father   . Heart disease Maternal Grandmother   . Hyperlipidemia Maternal Grandmother   . Heart disease Paternal Grandmother   . Hypertension Paternal Grandmother   . Diabetes Paternal Grandmother   . Arthritis Paternal Grandfather    History  Substance Use Topics  . Smoking status: Former Smoker -- 1.00 packs/day for 36 years    Types: Cigarettes    Quit date: 09/09/2013  . Smokeless tobacco: Never Used  . Alcohol Use: No   OB History    No data available     Review of Systems  Constitutional: Positive for fever (tmax 101.4). Negative for fatigue.  Respiratory: Positive for cough.    Gastrointestinal: Positive for nausea and vomiting. Negative for diarrhea.  All other systems reviewed and are negative.     Allergies  Ciprofloxacin; Latex; Clindamycin/lincomycin; Fentanyl; Indocin; Keflex; Penicillins; and Vancomycin  Home Medications   Prior to Admission medications   Medication Sig Start Date End Date Taking? Authorizing Provider  albuterol (PROVENTIL HFA;VENTOLIN HFA) 108 (90 BASE) MCG/ACT inhaler Inhale 2 puffs into the lungs every 6 (six) hours as needed for wheezing or shortness of breath. 10/10/13  Yes Domenic Polite, MD  ALPRAZolam Duanne Moron) 0.5 MG tablet Take 0.5 mg by mouth daily as needed for anxiety or sleep.   Yes Historical Provider, MD  aspirin EC 81 MG tablet Take 81 mg by mouth daily.   Yes Historical Provider, MD  atorvastatin (LIPITOR) 40 MG tablet Take 1 tablet (40 mg total) by mouth daily. 10/27/13  Yes Tammi Sou, MD  cetirizine (ZYRTEC) 10 MG tablet Take 10 mg by mouth daily as needed for allergies.   Yes Historical Provider, MD  cholecalciferol (VITAMIN D) 1000 UNITS tablet Take 1,000 Units by mouth daily.   Yes Historical Provider, MD  citalopram (CELEXA) 40 MG tablet Take 40 mg by mouth daily.   Yes Historical Provider, MD  dextromethorphan-guaiFENesin (TUSSIN DM) 10-100 MG/5ML liquid Take 10 mLs by mouth every 4 (four) hours as needed for cough.   Yes Historical Provider, MD  doxycycline (VIBRA-TABS) 100 MG tablet Take 100 mg by mouth 2 (two) times daily. Starting 12/19 for 7 days   Yes Historical Provider, MD  ferrous sulfate 325 (65 FE) MG tablet Take 325 mg by mouth daily with breakfast.   Yes Historical Provider, MD  furosemide (LASIX) 40 MG tablet Take 1 tablet (40 mg total) by mouth daily. 11/05/13  Yes Tammi Sou, MD  insulin aspart (NOVOLOG) 100 UNIT/ML injection Inject 6 Units into the skin 3 (three) times daily before meals. 10/10/13  Yes Domenic Polite, MD  Insulin Glargine (LANTUS) 100 UNIT/ML Solostar Pen Inject 40 Units into  the skin 2 (two) times daily. 40 units BID 11/25/13  Yes Tammi Sou, MD  letrozole [facility]) 2.5 MG tablet Take 1 tablet (2.5 mg total) by mouth daily. 11/30/13  Yes Tammi Sou, MD  levothyroxine (SYNTHROID, LEVOTHROID) 100 MCG tablet Take 1 tablet (100 mcg total) by mouth daily before breakfast. 02/09/14  Yes Tammi Sou, MD  lisinopril (PRINIVIL,ZESTRIL) 10 MG tablet Take 1 tablet (10 mg total) by mouth daily. 11/12/13  Yes Tammi Sou, MD  Magnesium Oxide 500 MG (LAX) TABS Take 500 mg by mouth 2 (two) times daily.   Yes Historical Provider, MD  meclizine (ANTIVERT) 12.5 MG tablet  Take 12.5 mg by mouth every 4 (four) hours as needed for dizziness. Take 12.5 mg every 4 hours x 3 doses as needed   Yes Historical Provider, MD  morphine (MS CONTIN) 15 MG 12 hr tablet Take 15-30 mg by mouth 2 (two) times daily. Take 15 mg every morning and 30 mg at bedtime 12/22/13  Yes Tammi Sou, MD  omeprazole (PRILOSEC) 40 MG capsule Take 1 capsule (40 mg total) by mouth daily. Patient taking differently: Take 40 mg by mouth 2 (two) times daily.  11/23/13  Yes Tammi Sou, MD  oseltamivir (TAMIFLU) 75 MG capsule Take 75 mg by mouth 2 (two) times daily. Starting 12/22 for 5 days   Yes Historical Provider, MD  oxyCODONE-acetaminophen (PERCOCET/ROXICET) 5-325 MG per tablet Take 1-2 tablets by mouth every 6 (six) hours as needed for severe pain. 02/12/14  Yes Erlene Senters, PA-C  potassium chloride SA (K-DUR,KLOR-CON) 20 MEQ tablet TAKE 1 TABLET BY MOUTH EVERY DAY 03/04/14  Yes Tammi Sou, MD  pregabalin (LYRICA) 150 MG capsule Take 1 capsule (150 mg total) by mouth 2 (two) times daily. 12/01/13  Yes Tammi Sou, MD  rOPINIRole (REQUIP) 2 MG tablet Take 2 mg by mouth at bedtime.   Yes Historical Provider, MD  tiotropium (SPIRIVA) 18 MCG inhalation capsule Place 18 mcg into inhaler and inhale daily.   Yes Historical Provider, MD  albuterol (PROVENTIL) (2.5 MG/3ML) 0.083% nebulizer  solution Take 3 mLs (2.5 mg total) by nebulization every 4 (four) hours as needed for wheezing or shortness of breath. Patient not taking: Reported on 04/16/2014 01/18/14   Tammi Sou, MD  ALPRAZolam Duanne Moron) 0.5 MG tablet TAKE 1 TABLET BY MOUTH EVERY DAY FOR ANXIETY OR SLEEP Patient not taking: Reported on 04/16/2014 04/15/14   Tammi Sou, MD  citalopram (CELEXA) 20 MG tablet TAKE 1 TABLET BY MOUTH EVERY DAY Patient not taking: Reported on 04/16/2014 03/04/14   Tammi Sou, MD  Insulin Pen Needle (BD PEN NEEDLE NANO U/F) 32G X 4 MM MISC Use pen needles as directed. 10/30/13   Tammi Sou, MD  potassium chloride SA (K-DUR,KLOR-CON) 20 MEQ tablet Take 20 mEq by mouth daily.    Historical Provider, MD   BP 149/49 mmHg  Pulse 86  Temp(Src) 99.4 F (37.4 C) (Rectal)  Resp 21  SpO2 92% Physical Exam  Constitutional: She is oriented to person, place, and time. She appears well-developed and well-nourished.  HENT:  Head: Normocephalic and atraumatic.  Right Ear: External ear normal.  Left Ear: External ear normal.  Eyes: Conjunctivae and EOM are normal. Pupils are equal, round, and reactive to light.  Neck: Normal range of motion. Neck supple.  Cardiovascular: Normal rate, regular rhythm, normal heart sounds and intact distal pulses.   Pulmonary/Chest: Effort normal. She has rhonchi (diffusely). She has rales (bibasilar).  Abdominal: Soft. Bowel sounds are normal. There is no tenderness.  Musculoskeletal: Normal range of motion.  Neurological: She is alert and oriented to person, place, and time.  Skin: Skin is warm and dry.  Vitals reviewed.   ED Course  Procedures (including critical care time) Labs Review Labs Reviewed  CBC WITH DIFFERENTIAL - Abnormal; Notable for the following:    RBC 3.59 (*)    Hemoglobin 9.4 (*)    HCT 29.7 (*)    Platelets 118 (*)    Neutrophils Relative % 89 (*)    Neutro Abs 9.0 (*)    Lymphocytes Relative 6 (*)  Lymphs Abs 0.6 (*)     All other components within normal limits  COMPREHENSIVE METABOLIC PANEL - Abnormal; Notable for the following:    Glucose, Bld 169 (*)    BUN 36 (*)    Albumin 3.1 (*)    GFR calc non Af Amer 63 (*)    GFR calc Af Amer 73 (*)    All other components within normal limits  URINALYSIS, ROUTINE W REFLEX MICROSCOPIC - Abnormal; Notable for the following:    APPearance CLOUDY (*)    Protein, ur 30 (*)    All other components within normal limits  TROPONIN I - Abnormal; Notable for the following:    Troponin I 0.18 (*)    All other components within normal limits  BRAIN NATRIURETIC PEPTIDE - Abnormal; Notable for the following:    B Natriuretic Peptide 225.8 (*)    All other components within normal limits  CULTURE, BLOOD (ROUTINE X 2)  CULTURE, BLOOD (ROUTINE X 2)  URINE CULTURE  URINE MICROSCOPIC-ADD ON  I-STAT CG4 LACTIC ACID, ED    Imaging Review Dg Chest 2 View  04/16/2014   CLINICAL DATA:  Productive cough for 2-3 days.  Initial encounter.  EXAM: CHEST  2 VIEW  COMPARISON:  Chest radiograph performed earlier today at 9:07 p.m.  FINDINGS: There is elevation of the right hemidiaphragm. Vascular congestion is noted, with increased interstitial markings, raising concern for mild pulmonary edema, though pneumonia might have a similar appearance. A small right pleural effusion is suspected. No pneumothorax is seen.  The heart is borderline enlarged. No acute osseous abnormalities are seen.  IMPRESSION: Elevation of the right hemidiaphragm. Vascular congestion and borderline cardiomegaly, with increased interstitial markings, raising concern for mild pulmonary edema, though pneumonia might have a similar appearance. Suspect small right pleural effusion.   Electronically Signed   By: Garald Balding M.D.   On: 04/16/2014 23:52   Dg Chest Port 1 View  04/16/2014   CLINICAL DATA:  Acute onset of shortness of breath. Initial encounter.  EXAM: PORTABLE CHEST - 1 VIEW  COMPARISON:  Chest  radiograph from 02/11/2014  FINDINGS: The lungs are hypoexpanded. There is elevation of the right hemidiaphragm. Vascular congestion is noted. Increased interstitial markings could reflect mild interstitial edema. No pleural effusion or pneumothorax is seen.  The cardiomediastinal silhouette is enlarged. No acute osseous abnormalities are identified.  IMPRESSION: Lungs hypoexpanded and difficult to fully assess, with chronic elevation of the right hemidiaphragm. Vascular congestion and cardiomegaly noted. Increased interstitial markings could reflect mild interstitial edema.   Electronically Signed   By: Garald Balding M.D.   On: 04/16/2014 21:56     EKG Interpretation None      MDM   Final diagnoses:  Shortness of breath  Cough  Viral syndrome    68 y.o. female with pertinent PMH of COPD on home O2 3L, DM, recent sick contact with influenza presents with cough, myalgias, fevers described above. Symptoms began this morning. No symptoms yesterday per patient report.  No chest pain.  On arrival vital signs and physical exam as above.  WU returned as above without obvious pathology, however pt did have positive troponin.  Suspect demand over primary acs, and will defer heparin given overall clinical picture and presence of fever PTA.  Admitted in stable condition.  I have reviewed all laboratory and imaging studies if ordered as above  1. Viral syndrome   2. Shortness of breath   3. Cough  Debby Freiberg, MD 04/17/14 [phone]

## 2014-04-17 ENCOUNTER — Encounter (HOSPITAL_COMMUNITY): Payer: Self-pay | Admitting: *Deleted

## 2014-04-17 ENCOUNTER — Observation Stay (HOSPITAL_COMMUNITY): Payer: Medicare Other

## 2014-04-17 DIAGNOSIS — J069 Acute upper respiratory infection, unspecified: Secondary | ICD-10-CM | POA: Diagnosis present

## 2014-04-17 DIAGNOSIS — B349 Viral infection, unspecified: Secondary | ICD-10-CM

## 2014-04-17 DIAGNOSIS — J440 Chronic obstructive pulmonary disease with acute lower respiratory infection: Secondary | ICD-10-CM

## 2014-04-17 DIAGNOSIS — E039 Hypothyroidism, unspecified: Secondary | ICD-10-CM

## 2014-04-17 DIAGNOSIS — R0602 Shortness of breath: Secondary | ICD-10-CM | POA: Diagnosis present

## 2014-04-17 DIAGNOSIS — E1121 Type 2 diabetes mellitus with diabetic nephropathy: Secondary | ICD-10-CM

## 2014-04-17 DIAGNOSIS — R112 Nausea with vomiting, unspecified: Secondary | ICD-10-CM | POA: Diagnosis present

## 2014-04-17 DIAGNOSIS — J111 Influenza due to unidentified influenza virus with other respiratory manifestations: Secondary | ICD-10-CM

## 2014-04-17 LAB — BASIC METABOLIC PANEL
ANION GAP: 13 (ref 5–15)
BUN: 30 mg/dL — ABNORMAL HIGH (ref 6–23)
CHLORIDE: 99 meq/L (ref 96–112)
CO2: 29 mmol/L (ref 19–32)
CREATININE: 0.89 mg/dL (ref 0.50–1.10)
Calcium: 9.2 mg/dL (ref 8.4–10.5)
GFR calc Af Amer: 75 mL/min — ABNORMAL LOW (ref >90)
GFR calc non Af Amer: 65 mL/min — ABNORMAL LOW (ref >90)
Glucose, Bld: 167 mg/dL — ABNORMAL HIGH (ref 70–99)
POTASSIUM: 4.4 mmol/L (ref 3.5–5.1)
SODIUM: 141 mmol/L (ref 135–145)

## 2014-04-17 LAB — CBC
HEMATOCRIT: 28.8 % — AB (ref 36.0–46.0)
HEMOGLOBIN: 8.9 g/dL — AB (ref 12.0–15.0)
MCH: 25.8 pg — ABNORMAL LOW (ref 26.0–34.0)
MCHC: 30.9 g/dL (ref 30.0–36.0)
MCV: 83.5 fL (ref 78.0–100.0)
Platelets: 105 10*3/uL — ABNORMAL LOW (ref 150–400)
RBC: 3.45 MIL/uL — AB (ref 3.87–5.11)
RDW: 15.1 % (ref 11.5–15.5)
WBC: 7.4 10*3/uL (ref 4.0–10.5)

## 2014-04-17 LAB — BLOOD GAS, ARTERIAL
Acid-Base Excess: 7.3 mmol/L — ABNORMAL HIGH (ref 0.0–2.0)
Bicarbonate: 32.4 mEq/L — ABNORMAL HIGH (ref 20.0–24.0)
DRAWN BY: 20517
O2 CONTENT: 4 L/min
O2 SAT: 90.5 %
PATIENT TEMPERATURE: 98.6
TCO2: 34.1 mmol/L (ref 0–100)
pCO2 arterial: 55.8 mmHg — ABNORMAL HIGH (ref 35.0–45.0)
pH, Arterial: 7.382 (ref 7.350–7.450)
pO2, Arterial: 57.1 mmHg — ABNORMAL LOW (ref 80.0–100.0)

## 2014-04-17 LAB — LACTIC ACID, PLASMA: Lactic Acid, Venous: 1.1 mmol/L (ref 0.5–2.2)

## 2014-04-17 LAB — TROPONIN I
Troponin I: 0.2 ng/mL — ABNORMAL HIGH (ref <0.031)
Troponin I: 0.18 ng/mL — ABNORMAL HIGH (ref <0.031)
Troponin I: 0.2 ng/mL — ABNORMAL HIGH (ref <0.031)

## 2014-04-17 LAB — GLUCOSE, CAPILLARY
GLUCOSE-CAPILLARY: 154 mg/dL — AB (ref 70–99)
GLUCOSE-CAPILLARY: 168 mg/dL — AB (ref 70–99)
Glucose-Capillary: 147 mg/dL — ABNORMAL HIGH (ref 70–99)
Glucose-Capillary: 217 mg/dL — ABNORMAL HIGH (ref 70–99)

## 2014-04-17 LAB — CK TOTAL AND CKMB (NOT AT ARMC)
CK, MB: 1.2 ng/mL (ref 0.3–4.0)
RELATIVE INDEX: INVALID (ref 0.0–2.5)
Total CK: 26 U/L (ref 7–177)

## 2014-04-17 MED ORDER — ALBUTEROL SULFATE (2.5 MG/3ML) 0.083% IN NEBU
2.5000 mg | INHALATION_SOLUTION | Freq: Once | RESPIRATORY_TRACT | Status: AC
  Administered 2014-04-17: 2.5 mg via RESPIRATORY_TRACT

## 2014-04-17 MED ORDER — OSELTAMIVIR PHOSPHATE 75 MG PO CAPS
75.0000 mg | ORAL_CAPSULE | Freq: Two times a day (BID) | ORAL | Status: AC
  Administered 2014-04-17 – 2014-04-21 (×10): 75 mg via ORAL
  Filled 2014-04-17 (×10): qty 1

## 2014-04-17 MED ORDER — INSULIN GLARGINE 100 UNIT/ML ~~LOC~~ SOLN
40.0000 [IU] | Freq: Two times a day (BID) | SUBCUTANEOUS | Status: DC
  Administered 2014-04-17 – 2014-04-29 (×26): 40 [IU] via SUBCUTANEOUS
  Filled 2014-04-17 (×28): qty 0.4

## 2014-04-17 MED ORDER — FUROSEMIDE 10 MG/ML IJ SOLN
INTRAMUSCULAR | Status: AC
  Administered 2014-04-17: 40 mg via INTRAVENOUS
  Filled 2014-04-17: qty 4

## 2014-04-17 MED ORDER — MAGNESIUM OXIDE (LAXATIVE) 500 MG PO TABS: 500.0000 mg | ORAL_TABLET | Freq: Two times a day (BID) | ORAL | Status: DC

## 2014-04-17 MED ORDER — ENOXAPARIN SODIUM 40 MG/0.4ML ~~LOC~~ SOLN
40.0000 mg | SUBCUTANEOUS | Status: DC
  Administered 2014-04-17 – 2014-04-27 (×11): 40 mg via SUBCUTANEOUS
  Filled 2014-04-17 (×12): qty 0.4

## 2014-04-17 MED ORDER — IPRATROPIUM-ALBUTEROL 0.5-2.5 (3) MG/3ML IN SOLN
3.0000 mL | RESPIRATORY_TRACT | Status: DC | PRN
  Administered 2014-04-19 – 2014-04-20 (×2): 3 mL via RESPIRATORY_TRACT
  Filled 2014-04-17 (×2): qty 3

## 2014-04-17 MED ORDER — SODIUM CHLORIDE 0.9 % IJ SOLN: 3.0000 mL | INTRAMUSCULAR | Status: DC | PRN

## 2014-04-17 MED ORDER — ALPRAZOLAM 0.5 MG PO TABS
0.5000 mg | ORAL_TABLET | Freq: Every day | ORAL | Status: DC | PRN
  Administered 2014-04-17 (×2): 0.5 mg via ORAL
  Filled 2014-04-17 (×2): qty 1
  Filled 2014-04-17: qty 2

## 2014-04-17 MED ORDER — FUROSEMIDE 10 MG/ML IJ SOLN
20.0000 mg | Freq: Two times a day (BID) | INTRAMUSCULAR | Status: DC
  Administered 2014-04-17 – 2014-04-18 (×2): 20 mg via INTRAVENOUS
  Filled 2014-04-17 (×2): qty 2

## 2014-04-17 MED ORDER — HYDRALAZINE HCL 20 MG/ML IJ SOLN
10.0000 mg | INTRAMUSCULAR | Status: DC | PRN
  Administered 2014-04-17 – 2014-04-21 (×5): 10 mg via INTRAVENOUS
  Filled 2014-04-17 (×4): qty 1

## 2014-04-17 MED ORDER — FUROSEMIDE 10 MG/ML IJ SOLN
INTRAMUSCULAR | Status: AC
  Filled 2014-04-17: qty 4

## 2014-04-17 MED ORDER — FERROUS SULFATE 325 (65 FE) MG PO TABS
325.0000 mg | ORAL_TABLET | Freq: Every day | ORAL | Status: DC
  Administered 2014-04-17 – 2014-04-28 (×12): 325 mg via ORAL
  Filled 2014-04-17 (×13): qty 1

## 2014-04-17 MED ORDER — PREGABALIN 75 MG PO CAPS
150.0000 mg | ORAL_CAPSULE | Freq: Two times a day (BID) | ORAL | Status: DC
  Administered 2014-04-17: 150 mg via ORAL
  Filled 2014-04-17: qty 2

## 2014-04-17 MED ORDER — IOHEXOL 350 MG/ML SOLN
100.0000 mL | Freq: Once | INTRAVENOUS | Status: AC | PRN
  Administered 2014-04-17: 100 mL via INTRAVENOUS

## 2014-04-17 MED ORDER — LEVOTHYROXINE SODIUM 100 MCG PO TABS
100.0000 ug | ORAL_TABLET | Freq: Every day | ORAL | Status: DC
  Administered 2014-04-17 – 2014-04-29 (×13): 100 ug via ORAL
  Filled 2014-04-17 (×14): qty 1

## 2014-04-17 MED ORDER — OXYCODONE HCL 5 MG PO TABS
5.0000 mg | ORAL_TABLET | ORAL | Status: DC | PRN
  Administered 2014-04-17 – 2014-04-22 (×14): 5 mg via ORAL
  Filled 2014-04-17 (×14): qty 1

## 2014-04-17 MED ORDER — FUROSEMIDE 40 MG PO TABS
40.0000 mg | ORAL_TABLET | Freq: Every day | ORAL | Status: DC
  Filled 2014-04-17: qty 1

## 2014-04-17 MED ORDER — ALBUTEROL SULFATE (2.5 MG/3ML) 0.083% IN NEBU
INHALATION_SOLUTION | RESPIRATORY_TRACT | Status: AC
  Administered 2014-04-17: 2.5 mg via RESPIRATORY_TRACT
  Filled 2014-04-17: qty 3

## 2014-04-17 MED ORDER — DEXTROMETHORPHAN-GUAIFENESIN 10-100 MG/5ML PO LIQD
10.0000 mL | ORAL | Status: DC | PRN
  Administered 2014-04-17: 10 mL via ORAL
  Filled 2014-04-17: qty 10

## 2014-04-17 MED ORDER — GUAIFENESIN-DM 100-10 MG/5ML PO SYRP
10.0000 mL | ORAL_SOLUTION | ORAL | Status: DC | PRN
  Administered 2014-04-18 – 2014-04-27 (×8): 10 mL via ORAL
  Filled 2014-04-17 (×13): qty 10

## 2014-04-17 MED ORDER — SODIUM CHLORIDE 0.9 % IV SOLN
250.0000 mL | INTRAVENOUS | Status: DC | PRN
  Administered 2014-04-19: 250 mL via INTRAVENOUS

## 2014-04-17 MED ORDER — ONDANSETRON HCL 4 MG PO TABS: 4.0000 mg | ORAL_TABLET | Freq: Four times a day (QID) | ORAL | Status: DC | PRN

## 2014-04-17 MED ORDER — MAGNESIUM OXIDE 400 (241.3 MG) MG PO TABS
400.0000 mg | ORAL_TABLET | Freq: Two times a day (BID) | ORAL | Status: DC
  Administered 2014-04-17 – 2014-04-29 (×25): 400 mg via ORAL
  Filled 2014-04-17 (×26): qty 1

## 2014-04-17 MED ORDER — PREGABALIN 75 MG PO CAPS
150.0000 mg | ORAL_CAPSULE | Freq: Two times a day (BID) | ORAL | Status: DC
  Administered 2014-04-17 – 2014-04-21 (×9): 150 mg via ORAL
  Administered 2014-04-22: 125 mg via ORAL
  Administered 2014-04-22 – 2014-04-29 (×14): 150 mg via ORAL
  Filled 2014-04-17: qty 2
  Filled 2014-04-17 (×2): qty 6
  Filled 2014-04-17 (×2): qty 2
  Filled 2014-04-17: qty 6
  Filled 2014-04-17: qty 2
  Filled 2014-04-17: qty 6
  Filled 2014-04-17: qty 2
  Filled 2014-04-17: qty 6
  Filled 2014-04-17 (×5): qty 2
  Filled 2014-04-17 (×3): qty 6
  Filled 2014-04-17 (×6): qty 2
  Filled 2014-04-17: qty 6

## 2014-04-17 MED ORDER — ROPINIROLE HCL 1 MG PO TABS
2.0000 mg | ORAL_TABLET | Freq: Every day | ORAL | Status: DC
  Administered 2014-04-17 – 2014-04-28 (×13): 2 mg via ORAL
  Filled 2014-04-17 (×14): qty 2

## 2014-04-17 MED ORDER — SODIUM CHLORIDE 0.9 % IJ SOLN
3.0000 mL | Freq: Two times a day (BID) | INTRAMUSCULAR | Status: DC
  Administered 2014-04-18 (×3): 3 mL via INTRAVENOUS

## 2014-04-17 MED ORDER — LETROZOLE 2.5 MG PO TABS
2.5000 mg | ORAL_TABLET | Freq: Every day | ORAL | Status: DC
Start: 2014-04-17 — End: 2014-04-29
  Administered 2014-04-17 – 2014-04-29 (×13): 2.5 mg via ORAL
  Filled 2014-04-17 (×13): qty 1

## 2014-04-17 MED ORDER — VITAMIN D3 25 MCG (1000 UNIT) PO TABS
1000.0000 [IU] | ORAL_TABLET | Freq: Every day | ORAL | Status: DC
  Administered 2014-04-17 – 2014-04-29 (×13): 1000 [IU] via ORAL
  Filled 2014-04-17 (×13): qty 1

## 2014-04-17 MED ORDER — LISINOPRIL 10 MG PO TABS
10.0000 mg | ORAL_TABLET | Freq: Every day | ORAL | Status: DC
Start: 2014-04-17 — End: 2014-04-29
  Administered 2014-04-17 – 2014-04-29 (×11): 10 mg via ORAL
  Filled 2014-04-17 (×13): qty 1

## 2014-04-17 MED ORDER — ACETAMINOPHEN 650 MG RE SUPP: 650.0000 mg | Freq: Four times a day (QID) | RECTAL | Status: DC | PRN

## 2014-04-17 MED ORDER — ACETAMINOPHEN 325 MG PO TABS: 650.0000 mg | ORAL_TABLET | Freq: Four times a day (QID) | ORAL | Status: DC | PRN

## 2014-04-17 MED ORDER — ASPIRIN EC 81 MG PO TBEC
81.0000 mg | DELAYED_RELEASE_TABLET | Freq: Every day | ORAL | Status: DC
  Administered 2014-04-17 – 2014-04-27 (×11): 81 mg via ORAL
  Filled 2014-04-17 (×12): qty 1

## 2014-04-17 MED ORDER — HYDROMORPHONE HCL 1 MG/ML IJ SOLN
0.5000 mg | INTRAMUSCULAR | Status: DC | PRN
  Administered 2014-04-18 – 2014-04-19 (×4): 1 mg via INTRAVENOUS
  Filled 2014-04-17 (×4): qty 1

## 2014-04-17 MED ORDER — ALUM & MAG HYDROXIDE-SIMETH 200-200-20 MG/5ML PO SUSP: 30.0000 mL | Freq: Four times a day (QID) | ORAL | Status: DC | PRN

## 2014-04-17 MED ORDER — ALPRAZOLAM 0.5 MG PO TABS
0.5000 mg | ORAL_TABLET | Freq: Three times a day (TID) | ORAL | Status: DC | PRN
  Administered 2014-04-17 – 2014-04-29 (×22): 0.5 mg via ORAL
  Filled 2014-04-17 (×22): qty 1

## 2014-04-17 MED ORDER — ONDANSETRON HCL 4 MG/2ML IJ SOLN: 4.0000 mg | Freq: Four times a day (QID) | INTRAMUSCULAR | Status: DC | PRN

## 2014-04-17 MED ORDER — DEXTROSE 5 % IV SOLN
500.0000 mg | INTRAVENOUS | Status: DC
  Administered 2014-04-17 – 2014-04-28 (×13): 500 mg via INTRAVENOUS
  Filled 2014-04-17 (×14): qty 500

## 2014-04-17 MED ORDER — POTASSIUM CHLORIDE CRYS ER 20 MEQ PO TBCR
20.0000 meq | EXTENDED_RELEASE_TABLET | Freq: Every day | ORAL | Status: DC
  Administered 2014-04-17 – 2014-04-29 (×13): 20 meq via ORAL
  Filled 2014-04-17 (×16): qty 1

## 2014-04-17 MED ORDER — IOHEXOL 300 MG/ML  SOLN
100.0000 mL | Freq: Once | INTRAMUSCULAR | Status: AC | PRN
  Administered 2014-04-17: 100 mL via INTRAVENOUS

## 2014-04-17 MED ORDER — SODIUM CHLORIDE 0.9 % IJ SOLN
3.0000 mL | Freq: Two times a day (BID) | INTRAMUSCULAR | Status: DC
  Administered 2014-04-17 (×2): 3 mL via INTRAVENOUS

## 2014-04-17 MED ORDER — FUROSEMIDE 10 MG/ML IJ SOLN
40.0000 mg | Freq: Once | INTRAMUSCULAR | Status: AC
  Administered 2014-04-17: 40 mg via INTRAVENOUS

## 2014-04-17 MED ORDER — ATORVASTATIN CALCIUM 40 MG PO TABS
40.0000 mg | ORAL_TABLET | Freq: Every day | ORAL | Status: DC
  Administered 2014-04-17 – 2014-04-29 (×13): 40 mg via ORAL
  Filled 2014-04-17 (×13): qty 1

## 2014-04-17 MED ORDER — LORATADINE 10 MG PO TABS
10.0000 mg | ORAL_TABLET | Freq: Every day | ORAL | Status: DC
  Administered 2014-04-17 – 2014-04-29 (×13): 10 mg via ORAL
  Filled 2014-04-17 (×13): qty 1

## 2014-04-17 MED ORDER — MORPHINE SULFATE ER 15 MG PO TBCR
30.0000 mg | EXTENDED_RELEASE_TABLET | Freq: Every day | ORAL | Status: DC
  Administered 2014-04-17 – 2014-04-22 (×7): 30 mg via ORAL
  Filled 2014-04-17 (×6): qty 2
  Filled 2014-04-17: qty 1

## 2014-04-17 MED ORDER — MORPHINE SULFATE ER 15 MG PO TBCR
15.0000 mg | EXTENDED_RELEASE_TABLET | Freq: Every day | ORAL | Status: DC
  Administered 2014-04-17 – 2014-04-23 (×7): 15 mg via ORAL
  Filled 2014-04-17 (×7): qty 1

## 2014-04-17 MED ORDER — METHYLPREDNISOLONE SODIUM SUCC 125 MG IJ SOLR
60.0000 mg | Freq: Two times a day (BID) | INTRAMUSCULAR | Status: DC
  Administered 2014-04-17 – 2014-04-19 (×5): 60 mg via INTRAVENOUS
  Filled 2014-04-17: qty 2
  Filled 2014-04-17: qty 0.96
  Filled 2014-04-17: qty 2
  Filled 2014-04-17 (×3): qty 0.96

## 2014-04-17 MED ORDER — IPRATROPIUM-ALBUTEROL 0.5-2.5 (3) MG/3ML IN SOLN: 3.0000 mL | RESPIRATORY_TRACT | Status: DC

## 2014-04-17 NOTE — ED Notes (Signed)
Transporting patient to new room assignment. 

## 2014-04-17 NOTE — Progress Notes (Signed)
Pt. Having a difficult time breathing.  VSS - Blood pressure 188/63, pulse 99, temperature 98.6 F (37 C), temperature source Oral, resp. rate 32, height 5\' 3"  (1.6 m), weight 101.197 kg (223 lb 1.6 oz), SpO2 90 %., O2   @ 3 L. Dr. Daleen Bo text paged.  Will continue to monitor and await for return call.  Alphonzo Lemmings, RN

## 2014-04-17 NOTE — Progress Notes (Signed)
[facility] PROGRESS NOTE  Brooke Mccullough ZWC:[id number] DOB: Mar 29, 1946 DOA: 04/16/2014 PCP: Tammi Sou, MD  Assessment/Plan: 68 y/o female with PMH of HTN, HPL, DM, COPD, chronic respiratory failure on oxygen, presented with SOB, cough, fever and found to have influenza, pneumonia, hypoxia  -patient is still hypoxic, respiratory distress, congested in AM, +trop but ECG done since admission  -obtain start ECG, ABG, bronchodilators, BiPAP as needed; TF to SDU   1. Influenza pneumonia (+sick contact); started tamiflu;  2. Acute on hypoxic chronic respiratory failure likely due to influenza/and also congested;   -obtain ABG, started bronchodilators scheduled/prn, cont oxygen, NiPPV as needed; TF to SDU, close monitor -will obtain CTA r/o PE; recent R ankle fracture with leg edema; obtain US leg as well  3. COPD exacerbation; cont antiviral,s bronchodilators, added azithromycin, steroids; NiPPV as needed  5. CHF exacerbation, diastolic HF;  Echo (2353): LVEF 65-70% -exam congested, volume overload; stat lasix 40 mgx 1; cont gentle diuresis bid; close monitor I/O, daily weight;  6.  +trop; no ECG since admission; obtain stat ECG; patient denies acute chest pains; serial ECG, trop;  7. DM on insulin regimen at home; no recent HA1c; cont insulin, check ha1c  D/w patient; no family at the bedside   Code Status: full Family Communication: d/w patient,  (indicate person spoken with, relationship, and if by phone, the number) Disposition Plan: pend clinical improvement    Consultants:  none  Procedures:  none  Antibiotics:  Levofloxacin 12/26>>>>   (indicate start date, and stop date if known)  HPI/Subjective: Alert, mild respiratory distress   Objective: Filed Vitals:   04/17/14 0931  BP: 188/63  Pulse: 99  Temp: 98.6 F (37 C)  Resp: 32    Intake/Output Summary (Last 24 hours) at 04/17/14 0952 Last data filed at 04/17/14 0856  Gross per 24 hour  Intake     360 ml  Output    450 ml  Net    -90 ml   Filed Weights   04/17/14 0037  Weight: 101.197 kg (223 lb 1.6 oz)    Exam:   General:  Alert, mild respiratory distress   Cardiovascular: s1,s2 tachy  Respiratory: BL LL crackles   Abdomen: soft, nt,nd  Musculoskeletal: R leg mild edema   Data Reviewed: Basic Metabolic Panel:  Recent Labs Lab 04/16/14 2104 04/17/14 0640  NA 137 141  K 4.7 4.4  CL 98 99  CO2 28 29  GLUCOSE 169* 167*  BUN 36* 30*  CREATININE 0.91 0.89  CALCIUM 8.7 9.2   Liver Function Tests:  Recent Labs Lab 04/16/14 2104  AST 19  ALT 13  ALKPHOS 82  BILITOT 0.4  PROT 6.2  ALBUMIN 3.1*   No results for input(s): LIPASE, AMYLASE in the last 168 hours. No results for input(s): AMMONIA in the last 168 hours. CBC:  Recent Labs Lab 04/16/14 2104 04/17/14 0640  WBC 10.2 7.4  NEUTROABS 9.0*  --   HGB 9.4* 8.9*  HCT 29.7* 28.8*  MCV 82.7 83.5  PLT 118* 105*   Cardiac Enzymes:  Recent Labs Lab 04/16/14 2104 04/17/14 0140 04/17/14 0640  CKTOTAL  --  26  --   CKMB  --  1.2  --   TROPONINI 0.18* 0.20* 0.18*   BNP (last 3 results)  Recent Labs  09/28/13 1304 10/07/13 1908  PROBNP 356.8* 919.7*   CBG:  Recent Labs Lab 04/17/14 0052 04/17/14 0811  GLUCAP 147* 154*    No results found for this  or any previous visit (from the past 240 hour(s)).   Studies: Dg Chest 2 View  04/16/2014   CLINICAL DATA:  Productive cough for 2-3 days.  Initial encounter.  EXAM: CHEST  2 VIEW  COMPARISON:  Chest radiograph performed earlier today at 9:07 p.m.  FINDINGS: There is elevation of the right hemidiaphragm. Vascular congestion is noted, with increased interstitial markings, raising concern for mild pulmonary edema, though pneumonia might have a similar appearance. A small right pleural effusion is suspected. No pneumothorax is seen.  The heart is borderline enlarged. No acute osseous abnormalities are seen.  IMPRESSION: Elevation of the  right hemidiaphragm. Vascular congestion and borderline cardiomegaly, with increased interstitial markings, raising concern for mild pulmonary edema, though pneumonia might have a similar appearance. Suspect small right pleural effusion.   Electronically Signed   By: Garald Balding M.D.   On: 04/16/2014 23:52   Dg Chest Port 1 View  04/16/2014   CLINICAL DATA:  Acute onset of shortness of breath. Initial encounter.  EXAM: PORTABLE CHEST - 1 VIEW  COMPARISON:  Chest radiograph from 02/11/2014  FINDINGS: The lungs are hypoexpanded. There is elevation of the right hemidiaphragm. Vascular congestion is noted. Increased interstitial markings could reflect mild interstitial edema. No pleural effusion or pneumothorax is seen.  The cardiomediastinal silhouette is enlarged. No acute osseous abnormalities are identified.  IMPRESSION: Lungs hypoexpanded and difficult to fully assess, with chronic elevation of the right hemidiaphragm. Vascular congestion and cardiomegaly noted. Increased interstitial markings could reflect mild interstitial edema.   Electronically Signed   By: Garald Balding M.D.   On: 04/16/2014 21:56    Scheduled Meds: . albuterol  2.5 mg Nebulization Once  . aspirin EC  81 mg Oral Daily  . atorvastatin  40 mg Oral Daily  . cholecalciferol  1,000 Units Oral Daily  . enoxaparin (LOVENOX) injection  40 mg Subcutaneous Q24H  . ferrous sulfate  325 mg Oral Q breakfast  . furosemide  40 mg Intravenous Once  . furosemide  40 mg Oral Daily  . insulin glargine  40 Units Subcutaneous BID  . letrozole  2.5 mg Oral Daily  . levothyroxine  100 mcg Oral QAC breakfast  . lisinopril  10 mg Oral Daily  . loratadine  10 mg Oral Daily  . magnesium oxide  400 mg Oral BID  . morphine  15 mg Oral Daily  . morphine  30 mg Oral QHS  . oseltamivir  75 mg Oral BID  . pregabalin  150 mg Oral BID  . rOPINIRole  2 mg Oral QHS  . sodium chloride  3 mL Intravenous Q12H  . sodium chloride  3 mL Intravenous Q12H    Continuous Infusions:   Principal Problem:   SOB (shortness of breath) Active Problems:   Diabetes   Chronic pain syndrome   Hypothyroidism   HTN (hypertension), benign   COPD (chronic obstructive pulmonary disease)   Influenza   Viral URI   Nausea and vomiting    Time spent: >35 minutes     Kinnie Feil  [facility] Pager [phone]. If 7PM-7AM, please contact night-coverage at [email or web address], password [facility] 04/17/2014, 9:52 AM  LOS: 1 day

## 2014-04-17 NOTE — H&P (Signed)
[facility] Admission History and Physical       Brooke Mccullough PPI:[id number] DOB: 1945/06/16 DOA: 04/16/2014  Referring physician: EDP PCP: Tammi Sou, MD  Specialists:   Chief Complaint: SOB  HPI: Brooke Mccullough is a 68 y.o. female remote history of Breast Cancer, DM2, Hypothyroid, and Anemia who presents to the ED with complaints of increased SOB, URI Symptoms and Myalgia for the pat 2 days.   She and her husband reside a the [place] [employer] and her husband had been diagnosed with Influenza during the past week.  She has had nausea and vomiting and fever to 101.4.   Marland Kitchen She was referred for Observation.       Review of Systems:  Constitutional: No Weight Loss, No Weight Gain, Night Sweats, +Fevers, Chills, Dizziness, +Fatigue, +Generalized Weakness HEENT: No Headaches, Difficulty Swallowing,Tooth/Dental Problems,Sore Throat,  No Sneezing, Rhinitis, Ear Ache, Nasal Congestion, or Post Nasal Drip,  Cardio-vascular:  No Chest pain, Orthopnea, PND, Edema in Lower Extremities, Anasarca, Dizziness, Palpitations  Resp: +Dyspnea, No DOE, +Cough, No Hemoptysis, No Wheezing.    GI: No Heartburn, Indigestion, Abdominal Pain, +Nausea, +Vomiting, Diarrhea, Hematemesis, Hematochezia, Melena, Change in Bowel Habits,  Loss of Appetite  GU: No Dysuria, Change in Color of Urine, No Urgency or Frequency, No Flank pain.  Musculoskeletal: No Joint Pain or Swelling, No Decreased Range of Motion, No Back Pain.  Neurologic: No Syncope, No Seizures, Muscle Weakness, Paresthesia, Vision Disturbance or Loss, No Diplopia, No Vertigo, No Difficulty Walking,  Skin: No Rash or Lesions. Psych: No Change in Mood or Affect, No Depression or Anxiety, No Memory loss, No Confusion, or Hallucinations   Past Medical History  Diagnosis Date  . COPD (chronic obstructive pulmonary disease)     nocturnal oxygen, plus daytime use with activity  . Fibromyalgia     Dx'd 1993.  This has impaired her  significantly.  . IBS (irritable bowel syndrome)   . Edema   . Gallstones     asymptomatic per pt (as of 09/30/2013); mild chronic elevation of Alk phos (remainder of hepatic panel wnl).  . Hernia of abdominal wall   . Hypertension   . Hyperlipidemia   . On home oxygen therapy     "3.5L @ night and during day prn" (09/28/2013)  . History of pneumonia     "once"  . Adrenal benign tumor   . Type II diabetes mellitus     hx of good control per pt report + HbA1c <7% June 2015.  Marland Kitchen Anemia 2015    Baseline Hb 11, normocytic.  Marland Kitchen GERD (gastroesophageal reflux disease)   . History of duodenal ulcer   . Daily headache   . Migraine     "@ least monthly" (09/28/2013)  . TIA (transient ischemic attack) 2012; 2014    left facial and left arm numbness  . Degenerative disc disease     Cervical and lumbar  . Osteoarthritis (arthritis due to wear and tear of joints)     "back; knees; elbows; left ankle" (09/28/2013)  . Chronic lower back pain     "L5-S1" (09/28/2013)  . Gout   . Anxiety   . Depression   . Breast cancer 2008    "right".  Lumpectomy + radiation+aromatase inhibitor (femara).  Regular f/u and mammos normal.  . Tremor     "upper extremities; last 6-7 months" (09/28/2013)  . Unspecified hypothyroidism 12/21/2013  . Multinodular goiter summer 2015    FNA 09/24/99 "Follicular lesion of undetermined significance": a follicular  lesion/neoplasm can not be ruled out--Dr. Cruzita Lederer recommended repeat bx in 6-12 mo  . Closed right trimalleolar fracture 01/2014    Surgery      Past Surgical History  Procedure Laterality Date  . Lumbar laminectomy  1991; 1996  . Ankle fracture surgery Left 08/2011    Bimalleolar fx  . Total abdominal hysterectomy w/ bilateral salpingoophorectomy  1990  . Breast biopsy Right 2008  . Breast lumpectomy Right 2008  . Cataract extraction w/ intraocular lens implant Right 08/2013  . Cardiac catheterization  2000's X 2  . Bladder tack    . Orif ankle fracture Right  02/11/2014    Procedure: OPEN REDUCTION INTERNAL FIXATION (ORIF) ANKLE FRACTURE;  Surgeon: Alta Corning, MD;  Location: [facility];  Service: Orthopedics;  Laterality: Right;       Prior to Admission medications   Medication Sig Start Date End Date Taking? Authorizing Provider  albuterol (PROVENTIL HFA;VENTOLIN HFA) 108 (90 BASE) MCG/ACT inhaler Inhale 2 puffs into the lungs every 6 (six) hours as needed for wheezing or shortness of breath. 10/10/13  Yes Domenic Polite, MD  ALPRAZolam Duanne Moron) 0.5 MG tablet Take 0.5 mg by mouth daily as needed for anxiety or sleep.   Yes Historical Provider, MD  aspirin EC 81 MG tablet Take 81 mg by mouth daily.   Yes Historical Provider, MD  atorvastatin (LIPITOR) 40 MG tablet Take 1 tablet (40 mg total) by mouth daily. 10/27/13  Yes Tammi Sou, MD  cetirizine (ZYRTEC) 10 MG tablet Take 10 mg by mouth daily as needed for allergies.   Yes Historical Provider, MD  cholecalciferol (VITAMIN D) 1000 UNITS tablet Take 1,000 Units by mouth daily.   Yes Historical Provider, MD  citalopram (CELEXA) 40 MG tablet Take 40 mg by mouth daily.   Yes Historical Provider, MD  dextromethorphan-guaiFENesin (TUSSIN DM) 10-100 MG/5ML liquid Take 10 mLs by mouth every 4 (four) hours as needed for cough.   Yes Historical Provider, MD  doxycycline (VIBRA-TABS) 100 MG tablet Take 100 mg by mouth 2 (two) times daily. Starting 12/19 for 7 days   Yes Historical Provider, MD  ferrous sulfate 325 (65 FE) MG tablet Take 325 mg by mouth daily with breakfast.   Yes Historical Provider, MD  furosemide (LASIX) 40 MG tablet Take 1 tablet (40 mg total) by mouth daily. 11/05/13  Yes Tammi Sou, MD  insulin aspart (NOVOLOG) 100 UNIT/ML injection Inject 6 Units into the skin 3 (three) times daily before meals. 10/10/13  Yes Domenic Polite, MD  Insulin Glargine (LANTUS) 100 UNIT/ML Solostar Pen Inject 40 Units into the skin 2 (two) times daily. 40 units BID 11/25/13  Yes Tammi Sou, MD    letrozole [facility]) 2.5 MG tablet Take 1 tablet (2.5 mg total) by mouth daily. 11/30/13  Yes Tammi Sou, MD  levothyroxine (SYNTHROID, LEVOTHROID) 100 MCG tablet Take 1 tablet (100 mcg total) by mouth daily before breakfast. 02/09/14  Yes Tammi Sou, MD  lisinopril (PRINIVIL,ZESTRIL) 10 MG tablet Take 1 tablet (10 mg total) by mouth daily. 11/12/13  Yes Tammi Sou, MD  Magnesium Oxide 500 MG (LAX) TABS Take 500 mg by mouth 2 (two) times daily.   Yes Historical Provider, MD  meclizine (ANTIVERT) 12.5 MG tablet Take 12.5 mg by mouth every 4 (four) hours as needed for dizziness. Take 12.5 mg every 4 hours x 3 doses as needed   Yes Historical Provider, MD  morphine (MS CONTIN) 15 MG 12 hr  tablet Take 15-30 mg by mouth 2 (two) times daily. Take 15 mg every morning and 30 mg at bedtime 12/22/13  Yes Tammi Sou, MD  omeprazole (PRILOSEC) 40 MG capsule Take 1 capsule (40 mg total) by mouth daily. Patient taking differently: Take 40 mg by mouth 2 (two) times daily.  11/23/13  Yes Tammi Sou, MD  oseltamivir (TAMIFLU) 75 MG capsule Take 75 mg by mouth 2 (two) times daily. Starting 12/22 for 5 days   Yes Historical Provider, MD  oxyCODONE-acetaminophen (PERCOCET/ROXICET) 5-325 MG per tablet Take 1-2 tablets by mouth every 6 (six) hours as needed for severe pain. 02/12/14  Yes Erlene Senters, PA-C  potassium chloride SA (K-DUR,KLOR-CON) 20 MEQ tablet TAKE 1 TABLET BY MOUTH EVERY DAY 03/04/14  Yes Tammi Sou, MD  pregabalin (LYRICA) 150 MG capsule Take 1 capsule (150 mg total) by mouth 2 (two) times daily. 12/01/13  Yes Tammi Sou, MD  rOPINIRole (REQUIP) 2 MG tablet Take 2 mg by mouth at bedtime.   Yes Historical Provider, MD  tiotropium (SPIRIVA) 18 MCG inhalation capsule Place 18 mcg into inhaler and inhale daily.   Yes Historical Provider, MD  albuterol (PROVENTIL) (2.5 MG/3ML) 0.083% nebulizer solution Take 3 mLs (2.5 mg total) by nebulization every 4 (four) hours as needed  for wheezing or shortness of breath. Patient not taking: Reported on 04/16/2014 01/18/14   Tammi Sou, MD  ALPRAZolam Duanne Moron) 0.5 MG tablet TAKE 1 TABLET BY MOUTH EVERY DAY FOR ANXIETY OR SLEEP Patient not taking: Reported on 04/16/2014 04/15/14   Tammi Sou, MD  citalopram (CELEXA) 20 MG tablet TAKE 1 TABLET BY MOUTH EVERY DAY Patient not taking: Reported on 04/16/2014 03/04/14   Tammi Sou, MD  Insulin Pen Needle (BD PEN NEEDLE NANO U/F) 32G X 4 MM MISC Use pen needles as directed. 10/30/13   Tammi Sou, MD  potassium chloride SA (K-DUR,KLOR-CON) 20 MEQ tablet Take 20 mEq by mouth daily.    Historical Provider, MD      Allergies  Allergen Reactions  . Ciprofloxacin Other (See Comments)    Unspecified--noted in old PCP's records.  . Latex Other (See Comments)    Unspecified: noted in old PCP's records.  . Clindamycin/Lincomycin Rash  . Fentanyl Rash    Duragesic Patch  . Indocin [Indomethacin] Rash  . Keflex [Cephalexin] Rash  . Penicillins Rash  . Vancomycin Rash     Social History:  reports that she quit smoking about 7 months ago. Her smoking use included Cigarettes. She has a 36 pack-year smoking history. She has never used smokeless tobacco. She reports that she does not drink alcohol or use illicit drugs.     Family History  Problem Relation Age of Onset  . Arthritis Mother   . Breast cancer Mother   . Hyperlipidemia Mother   . Stroke Mother   . Hypertension Mother   . Mental illness Mother   . Arthritis Father   . Cancer Father   . Hyperlipidemia Father   . Heart disease Father   . Heart disease Maternal Grandmother   . Hyperlipidemia Maternal Grandmother   . Heart disease Paternal Grandmother   . Hypertension Paternal Grandmother   . Diabetes Paternal Grandmother   . Arthritis Paternal Grandfather        Physical Exam:  GEN:  Pleasant Elderly Obese 68 y.o.Caucasian female examined and in no acute distress; cooperative with  exam Filed Vitals:   04/16/14 2300 04/16/14 2340  04/17/14 0012 04/17/14 0037  BP: 144/46 153/51 149/49 133/64  Pulse: 87 91 86 89  Temp:    98.3 F (36.8 C)  TempSrc:    Oral  Resp: 26  21 20   Height:    5' 3"  (1.6 m)  Weight:    101.197 kg (223 lb 1.6 oz)  SpO2: 95% 92% 92% 93%   Blood pressure 133/64, pulse 89, temperature 98.3 F (36.8 C), temperature source Oral, resp. rate 20, height 5' 3"  (1.6 m), weight 101.197 kg (223 lb 1.6 oz), SpO2 93 %. PSYCH: She is alert and oriented x4; does not appear anxious does not appear depressed; affect is normal HEENT: Normocephalic and Atraumatic, Mucous membranes pink; PERRLA; EOM intact; Fundi:  Benign;  No scleral icterus, Nares: Patent, Oropharynx: Clear, Fair Dentition,    Neck:  FROM, No Cervical Lymphadenopathy nor Thyromegaly or Carotid Bruit; No JVD; Breasts:: Not examined CHEST WALL: No tenderness CHEST: Normal respiration, clear to auscultation bilaterally HEART: Regular rate and rhythm; no murmurs rubs or gallops BACK: No kyphosis or scoliosis; No CVA tenderness ABDOMEN: Positive Bowel Sounds, Obese, Soft Non-Tender; No Masses, No Organomegaly. Rectal Exam: Not done EXTREMITIES: RLE : In Immobilizer due to Fracture of Ankle      LLE:   No Cyanosis, Clubbing, or Edema; No Ulcerations. Genitalia: not examined PULSES: 2+ and symmetric SKIN: Normal hydration no rash or ulceration CNS:  Alert and Oriented X 4,  NonFocal Vascular: pulses palpable throughout    Labs on Admission:  Basic Metabolic Panel:  Recent Labs Lab 04/16/14 2104  NA 137  K 4.7  CL 98  CO2 28  GLUCOSE 169*  BUN 36*  CREATININE 0.91  CALCIUM 8.7   Liver Function Tests:  Recent Labs Lab 04/16/14 2104  AST 19  ALT 13  ALKPHOS 82  BILITOT 0.4  PROT 6.2  ALBUMIN 3.1*   No results for input(s): LIPASE, AMYLASE in the last 168 hours. No results for input(s): AMMONIA in the last 168 hours. CBC:  Recent Labs Lab 04/16/14 2104  WBC 10.2   NEUTROABS 9.0*  HGB 9.4*  HCT 29.7*  MCV 82.7  PLT 118*   Cardiac Enzymes:  Recent Labs Lab 04/16/14 2104 04/17/14 0140  CKTOTAL  --  26  CKMB  --  1.2  TROPONINI 0.18* 0.20*    BNP (last 3 results)  Recent Labs  09/28/13 1304 10/07/13 1908  PROBNP 356.8* 919.7*   CBG:  Recent Labs Lab 04/17/14 0052  GLUCAP 147*    Radiological Exams on Admission: Dg Chest 2 View  04/16/2014   CLINICAL DATA:  Productive cough for 2-3 days.  Initial encounter.  EXAM: CHEST  2 VIEW  COMPARISON:  Chest radiograph performed earlier today at 9:07 p.m.  FINDINGS: There is elevation of the right hemidiaphragm. Vascular congestion is noted, with increased interstitial markings, raising concern for mild pulmonary edema, though pneumonia might have a similar appearance. A small right pleural effusion is suspected. No pneumothorax is seen.  The heart is borderline enlarged. No acute osseous abnormalities are seen.  IMPRESSION: Elevation of the right hemidiaphragm. Vascular congestion and borderline cardiomegaly, with increased interstitial markings, raising concern for mild pulmonary edema, though pneumonia might have a similar appearance. Suspect small right pleural effusion.   Electronically Signed   By: Garald Balding M.D.   On: 04/16/2014 23:52   Dg Chest Port 1 View  04/16/2014   CLINICAL DATA:  Acute onset of shortness of breath. Initial encounter.  EXAM: PORTABLE  CHEST - 1 VIEW  COMPARISON:  Chest radiograph from 02/11/2014  FINDINGS: The lungs are hypoexpanded. There is elevation of the right hemidiaphragm. Vascular congestion is noted. Increased interstitial markings could reflect mild interstitial edema. No pleural effusion or pneumothorax is seen.  The cardiomediastinal silhouette is enlarged. No acute osseous abnormalities are identified.  IMPRESSION: Lungs hypoexpanded and difficult to fully assess, with chronic elevation of the right hemidiaphragm. Vascular congestion and cardiomegaly  noted. Increased interstitial markings could reflect mild interstitial edema.   Electronically Signed   By: Garald Balding M.D.   On: 04/16/2014 21:56     EKG: Independently reviewed.    Assessment/Plan:  68 y.o. female with   Principal Problem:   1.    SOB (shortness of breath)- due to #2   O2 PRN   Monitor O2 Sats   Active Problems:   2.    Influenza/ Viral URI   Tamiflu 75 mg PO BID   O2     3.    COPD (chronic obstructive pulmonary disease)   Nebs O2     4.    Nausea and vomiting   PRN IV Anti-Emetics       5.    Diabetes   SSI Coverage PRN   Check HbA1c     6.   Chronic pain syndrome   Continue Pain Med Regimen    Morphine ER and IR    7.    Hypothyroidism   Continue Levothyroxine     8.    HTN (hypertension), benign   Contninue Lisinopril and Lasix Rx    As BP Tolerates    9.    Elevated Troponin-   Check CK, CKMB     9.   DVT Prophylaxis   Lovenox     Code Status:      FULL CODE Family Communication:  No Family Present   Disposition Plan:    Inpatient     Time spent:  [place] [facility] Pager [phone]   If [place] Please Contact the Day Rounding Team MD for [facility]  If 7PM-7AM, Please Contact Night-Floor Coverage  [email or web address] Password [facility] 04/17/2014, 2:33 AM

## 2014-04-17 NOTE — Progress Notes (Signed)
Dr. Daleen Bo return called and new orders placed, also to come up to see pt.  Will carry out orders and continue to monitor.  Alphonzo Lemmings, RN

## 2014-04-17 NOTE — Progress Notes (Signed)
Patient complained of difficulty breathing. O2 saturations slowly declining and RR in 30s. RT called and requested for someone to come give a PRN breathing treatment. Patient positioned optimally in the bed and switched to partial rebreather with O2 flow rate at 15L.  Patient encouraged to concentrate on breathing, taking slow, deep breaths in through her nose and out through her mouth.  Respiratory rate declined to 20s and O2 sats 100%.  Patient's respirations remained labored with accessory muscle use.  Spoke to patient about breathing treatment and Bipap as potential respiratory interventions.  She verbalized understanding and questions were answered. Awaiting RT at this time.  Patient is stable.  Will continue to monitor.

## 2014-04-17 NOTE — Progress Notes (Signed)
Pt. Being transferred to [facility].  Report call to [place], [place].  Will transferred pt. Via bed with telemetry & O2.  Alphonzo Lemmings, RN

## 2014-04-18 DIAGNOSIS — J441 Chronic obstructive pulmonary disease with (acute) exacerbation: Secondary | ICD-10-CM | POA: Diagnosis present

## 2014-04-18 DIAGNOSIS — Z853 Personal history of malignant neoplasm of breast: Secondary | ICD-10-CM | POA: Diagnosis not present

## 2014-04-18 DIAGNOSIS — R0602 Shortness of breath: Secondary | ICD-10-CM | POA: Insufficient documentation

## 2014-04-18 DIAGNOSIS — E785 Hyperlipidemia, unspecified: Secondary | ICD-10-CM | POA: Diagnosis present

## 2014-04-18 DIAGNOSIS — Z794 Long term (current) use of insulin: Secondary | ICD-10-CM | POA: Diagnosis not present

## 2014-04-18 DIAGNOSIS — J189 Pneumonia, unspecified organism: Secondary | ICD-10-CM

## 2014-04-18 DIAGNOSIS — J9621 Acute and chronic respiratory failure with hypoxia: Secondary | ICD-10-CM | POA: Diagnosis present

## 2014-04-18 DIAGNOSIS — K625 Hemorrhage of anus and rectum: Secondary | ICD-10-CM | POA: Diagnosis not present

## 2014-04-18 DIAGNOSIS — K219 Gastro-esophageal reflux disease without esophagitis: Secondary | ICD-10-CM | POA: Diagnosis present

## 2014-04-18 DIAGNOSIS — E039 Hypothyroidism, unspecified: Secondary | ICD-10-CM | POA: Diagnosis present

## 2014-04-18 DIAGNOSIS — Z8711 Personal history of peptic ulcer disease: Secondary | ICD-10-CM | POA: Diagnosis not present

## 2014-04-18 DIAGNOSIS — E1165 Type 2 diabetes mellitus with hyperglycemia: Secondary | ICD-10-CM | POA: Diagnosis present

## 2014-04-18 DIAGNOSIS — G894 Chronic pain syndrome: Secondary | ICD-10-CM | POA: Diagnosis present

## 2014-04-18 DIAGNOSIS — J8489 Other specified interstitial pulmonary diseases: Secondary | ICD-10-CM | POA: Diagnosis present

## 2014-04-18 DIAGNOSIS — Z9981 Dependence on supplemental oxygen: Secondary | ICD-10-CM | POA: Diagnosis not present

## 2014-04-18 DIAGNOSIS — B349 Viral infection, unspecified: Secondary | ICD-10-CM | POA: Insufficient documentation

## 2014-04-18 DIAGNOSIS — M797 Fibromyalgia: Secondary | ICD-10-CM | POA: Diagnosis present

## 2014-04-18 DIAGNOSIS — B359 Dermatophytosis, unspecified: Secondary | ICD-10-CM | POA: Diagnosis present

## 2014-04-18 DIAGNOSIS — Z87891 Personal history of nicotine dependence: Secondary | ICD-10-CM | POA: Diagnosis not present

## 2014-04-18 DIAGNOSIS — D696 Thrombocytopenia, unspecified: Secondary | ICD-10-CM | POA: Diagnosis present

## 2014-04-18 DIAGNOSIS — R609 Edema, unspecified: Secondary | ICD-10-CM

## 2014-04-18 DIAGNOSIS — I5033 Acute on chronic diastolic (congestive) heart failure: Secondary | ICD-10-CM | POA: Diagnosis present

## 2014-04-18 DIAGNOSIS — I429 Cardiomyopathy, unspecified: Secondary | ICD-10-CM | POA: Diagnosis present

## 2014-04-18 DIAGNOSIS — J440 Chronic obstructive pulmonary disease with acute lower respiratory infection: Secondary | ICD-10-CM | POA: Diagnosis present

## 2014-04-18 DIAGNOSIS — Z8673 Personal history of transient ischemic attack (TIA), and cerebral infarction without residual deficits: Secondary | ICD-10-CM | POA: Diagnosis not present

## 2014-04-18 DIAGNOSIS — D649 Anemia, unspecified: Secondary | ICD-10-CM | POA: Diagnosis present

## 2014-04-18 DIAGNOSIS — I10 Essential (primary) hypertension: Secondary | ICD-10-CM | POA: Diagnosis present

## 2014-04-18 DIAGNOSIS — Z7951 Long term (current) use of inhaled steroids: Secondary | ICD-10-CM | POA: Diagnosis not present

## 2014-04-18 LAB — BASIC METABOLIC PANEL
Anion gap: 14 (ref 5–15)
BUN: 28 mg/dL — AB (ref 6–23)
CALCIUM: 9.2 mg/dL (ref 8.4–10.5)
CO2: 27 mmol/L (ref 19–32)
Chloride: 98 mEq/L (ref 96–112)
Creatinine, Ser: 0.64 mg/dL (ref 0.50–1.10)
GFR calc Af Amer: >90 mL/min (ref >90)
GFR, EST NON AFRICAN AMERICAN: 89 mL/min — AB (ref >90)
GLUCOSE: 217 mg/dL — AB (ref 70–99)
POTASSIUM: 4.3 mmol/L (ref 3.5–5.1)
Sodium: 139 mmol/L (ref 135–145)

## 2014-04-18 LAB — GLUCOSE, CAPILLARY
GLUCOSE-CAPILLARY: 174 mg/dL — AB (ref 70–99)
Glucose-Capillary: 190 mg/dL — ABNORMAL HIGH (ref 70–99)
Glucose-Capillary: 216 mg/dL — ABNORMAL HIGH (ref 70–99)
Glucose-Capillary: 184 mg/dL — ABNORMAL HIGH (ref 70–99)

## 2014-04-18 LAB — CBC
HEMATOCRIT: 30.4 % — AB (ref 36.0–46.0)
Hemoglobin: 9.2 g/dL — ABNORMAL LOW (ref 12.0–15.0)
MCH: 25.1 pg — ABNORMAL LOW (ref 26.0–34.0)
MCHC: 30.3 g/dL (ref 30.0–36.0)
MCV: 83.1 fL (ref 78.0–100.0)
Platelets: 97 10*3/uL — ABNORMAL LOW (ref 150–400)
RBC: 3.66 MIL/uL — ABNORMAL LOW (ref 3.87–5.11)
RDW: 14.8 % (ref 11.5–15.5)
WBC: 5.9 10*3/uL (ref 4.0–10.5)

## 2014-04-18 LAB — URINE CULTURE
Colony Count: NO GROWTH
Culture: NO GROWTH

## 2014-04-18 LAB — TROPONIN I
TROPONIN I: 0.27 ng/mL — AB (ref <0.031)
TROPONIN I: 0.24 ng/mL — AB (ref <0.031)
Troponin I: 0.4 ng/mL — ABNORMAL HIGH (ref <0.031)

## 2014-04-18 MED ORDER — FUROSEMIDE 10 MG/ML IJ SOLN
INTRAMUSCULAR | Status: AC
  Filled 2014-04-18: qty 4

## 2014-04-18 MED ORDER — INSULIN ASPART 100 UNIT/ML ~~LOC~~ SOLN
0.0000 [IU] | Freq: Every day | SUBCUTANEOUS | Status: DC
  Administered 2014-04-19 – 2014-04-27 (×4): 2 [IU] via SUBCUTANEOUS

## 2014-04-18 MED ORDER — HYDRALAZINE HCL 25 MG PO TABS
25.0000 mg | ORAL_TABLET | Freq: Three times a day (TID) | ORAL | Status: DC
  Administered 2014-04-18 – 2014-04-27 (×22): 25 mg via ORAL
  Filled 2014-04-18 (×34): qty 1

## 2014-04-18 MED ORDER — INSULIN ASPART 100 UNIT/ML ~~LOC~~ SOLN
0.0000 [IU] | Freq: Three times a day (TID) | SUBCUTANEOUS | Status: DC
  Administered 2014-04-18: 5 [IU] via SUBCUTANEOUS
  Administered 2014-04-18: 3 [IU] via SUBCUTANEOUS
  Administered 2014-04-19: 5 [IU] via SUBCUTANEOUS
  Administered 2014-04-19 – 2014-04-20 (×4): 3 [IU] via SUBCUTANEOUS
  Administered 2014-04-20 – 2014-04-21 (×2): 5 [IU] via SUBCUTANEOUS
  Administered 2014-04-21: 3 [IU] via SUBCUTANEOUS
  Administered 2014-04-22 – 2014-04-25 (×3): 2 [IU] via SUBCUTANEOUS
  Administered 2014-04-25 – 2014-04-26 (×2): 5 [IU] via SUBCUTANEOUS
  Administered 2014-04-26 – 2014-04-27 (×3): 2 [IU] via SUBCUTANEOUS
  Administered 2014-04-27: 5 [IU] via SUBCUTANEOUS
  Administered 2014-04-28: 8 [IU] via SUBCUTANEOUS
  Administered 2014-04-28 – 2014-04-29 (×2): 3 [IU] via SUBCUTANEOUS
  Administered 2014-04-29: 5 [IU] via SUBCUTANEOUS

## 2014-04-18 NOTE — Progress Notes (Signed)
VASCULAR LAB PRELIMINARY  PRELIMINARY  PRELIMINARY  PRELIMINARY  Bilateral lower extremity venous Dopplers completed.    Preliminary report:  There is no obvious evidence of DVT or SVT noted in the bilateral lower extremities.  Interstitial fluid noted in the calves.  Vesta [place], RVT 04/18/2014, 12:19 PM

## 2014-04-18 NOTE — Progress Notes (Signed)
[facility] PROGRESS NOTE  Brooke Mccullough ZYS:[id number] DOB: 1945-06-21 DOA: 04/16/2014 PCP: Tammi Sou, MD   HPI/Subjective: Feels like she cant catch her breath. On 100% FiO2.    Assessment/Plan: 68 y/o female with PMH of HTN, HPL, DM, COPD, chronic respiratory failure on oxygen, presented with SOB, cough, fever and found to have influenza, pneumonia, hypoxia  +trop - EKG x 2 unremarkable   1. Influenza pneumonia (+sick contact); started tamiflu;  2. Acute on hypoxic chronic respiratory failure likely due to influenza, pneumonia and COPD exacerbation -ABG reveals hypoxia, started bronchodilators scheduled/prn, cont oxygen, NiPPV as needed; monitor in SDU - CTA neg for PE- reveals RLL and mild RUL infiltrate with adenopathy - numerous antibiotic allergies-cont zithromax for now - cont current dose of steroids 3. recent R ankle fracture with leg edema; obtain US leg  5. CHF exacerbation?- no pulm edema on CT from 93/23-F/T  diastolic HF;  Echo (7322): LVEF 65-70% -1 time order of Furosemide on 12/26- on 40 oral at home- hold for now as she has minimal PO intake and increase fluid losses due to resp distress- control BP 6.  +trop- repeat ECG unremarkable- likely type 2 NSTEMI due to resp distress; patient denies acute chest pain; serial , trop;  7. DM on insulin regimen at home- Lantus resumed- start sliding scale 8 HTN- on PRN Hydralazine- will add routine for now with holding parameters- cont Lisinopril 9. Chronic pain- cont MScontin, Dilaudid, Oxycodone  Code Status: full Family Communication: d/w patient Disposition Plan: pend clinical improvement    Consultants:  none  Procedures:  none  Antibiotics:  Azithromycin 12/26>>>>    Objective: Filed Vitals:   04/18/14 0832  BP: 173/67  Pulse: 85  Temp: 99.4 F (37.4 C)  Resp: 29    Intake/Output Summary (Last 24 hours) at 04/18/14 0846 Last data filed at 04/18/14 0254  Gross per 24 hour  Intake     833 ml  Output   3700 ml  Net  -2867 ml   Filed Weights   04/17/14 0037  Weight: 101.197 kg (223 lb 1.6 oz)    Exam:   General:  AAO x 2   Cardiovascular: s1,s2 - no murmurs  Respiratory: BL LL crackles - 100% FiO2- pulse ox 100%- RR in 30s able to speak in full sentences  Abdomen: soft, nt,nd  Musculoskeletal: R leg mild edema   Data Reviewed: Basic Metabolic Panel:  Recent Labs Lab 04/16/14 2104 04/17/14 0640 04/18/14 0345  NA 137 141 139  K 4.7 4.4 4.3  CL 98 99 98  CO2 28 29 27   GLUCOSE 169* 167* 217*  BUN 36* 30* 28*  CREATININE 0.91 0.89 0.64  CALCIUM 8.7 9.2 9.2   Liver Function Tests:  Recent Labs Lab 04/16/14 2104  AST 19  ALT 13  ALKPHOS 82  BILITOT 0.4  PROT 6.2  ALBUMIN 3.1*   No results for input(s): LIPASE, AMYLASE in the last 168 hours. No results for input(s): AMMONIA in the last 168 hours. CBC:  Recent Labs Lab 04/16/14 2104 04/17/14 0640 04/18/14 0345  WBC 10.2 7.4 5.9  NEUTROABS 9.0*  --   --   HGB 9.4* 8.9* 9.2*  HCT 29.7* 28.8* 30.4*  MCV 82.7 83.5 83.1  PLT 118* 105* 97*   Cardiac Enzymes:  Recent Labs Lab 04/16/14 2104 04/17/14 0140 04/17/14 0640 04/17/14 1307  CKTOTAL  --  26  --   --   CKMB  --  1.2  --   --  TROPONINI 0.18* 0.20* 0.18* 0.20*   BNP (last 3 results)  Recent Labs  09/28/13 1304 10/07/13 1908  PROBNP 356.8* 919.7*   CBG:  Recent Labs Lab 04/17/14 0052 04/17/14 0811 04/17/14 1203 04/17/14 2145 04/18/14 0825  GLUCAP 147* 154* 168* 217* 190*    Recent Results (from the past 240 hour(s))  Urine culture     Status: None   Collection Time: 04/16/14  9:25 PM  Result Value Ref Range Status   Specimen Description URINE, CATHETERIZED  Final   Special Requests NONE  Final   Colony Count NO GROWTH Performed at [facility]   Final   Culture NO GROWTH Performed at [facility]   Final   Report Status 04/18/2014 FINAL  Final     Studies: Dg Chest 2  View  04/16/2014   CLINICAL DATA:  Productive cough for 2-3 days.  Initial encounter.  EXAM: CHEST  2 VIEW  COMPARISON:  Chest radiograph performed earlier today at 9:07 p.m.  FINDINGS: There is elevation of the right hemidiaphragm. Vascular congestion is noted, with increased interstitial markings, raising concern for mild pulmonary edema, though pneumonia might have a similar appearance. A small right pleural effusion is suspected. No pneumothorax is seen.  The heart is borderline enlarged. No acute osseous abnormalities are seen.  IMPRESSION: Elevation of the right hemidiaphragm. Vascular congestion and borderline cardiomegaly, with increased interstitial markings, raising concern for mild pulmonary edema, though pneumonia might have a similar appearance. Suspect small right pleural effusion.   Electronically Signed   By: Garald Balding M.D.   On: 04/16/2014 23:52   Ct Angio Chest Pe W/cm &/or Wo Cm  04/17/2014   CLINICAL DATA:  Shortness of breath  EXAM: CT ANGIOGRAPHY CHEST WITH CONTRAST  TECHNIQUE: Multidetector CT imaging of the chest was performed using the standard protocol during bolus administration of intravenous contrast. Multiplanar CT image reconstructions and MIPs were obtained to evaluate the vascular anatomy.  CONTRAST:  179mL OMNIPAQUE IOHEXOL 350 MG/ML SOLN  COMPARISON:  Plain film from previous day  FINDINGS: Left lung is well aerated with some mild dependent atelectatic changes. The right lung demonstrates patchy infiltrate in the lower lobe and to a lesser degree in the upper lobe similar to that seen on prior plain film examination.  The thoracic inlet again demonstrates a prominent right thyroid nodule which has been previously biopsied earlier this year. The thoracic aorta shows diffuse calcifications without aneurysmal dilatation or dissection. The aortic branches are within normal limits. Mild coronary calcification is seen.  The pulmonary artery shows a normal branching pattern  without evidence of intraluminal filling defect. Some hilar adenopathy is noted bilaterally likely reactive related to the infiltrative change in the right lower lobe. Visualized upper abdomen reveals vicarious excretion of contrast within the gallbladder. Hypodense lesion within the right adrenal gland is noted likely representing an adenoma. The bony structures show no acute abnormality.  Review of the MIP images confirms the above findings.  IMPRESSION: No evidence of pulmonary emboli.  Right lower and mild right upper lobe infiltrate with associated adenopathy likely reactive in nature. Followup following appropriate therapy is recommended to rule out persistent abnormality.  Right adrenal lesion likely representing an adenoma.  No other focal abnormality is seen.   Electronically Signed   By: Inez Catalina M.D.   On: 04/17/2014 17:10   Dg Chest Port 1 View  04/16/2014   CLINICAL DATA:  Acute onset of shortness of breath. Initial encounter.  EXAM: PORTABLE  CHEST - 1 VIEW  COMPARISON:  Chest radiograph from 02/11/2014  FINDINGS: The lungs are hypoexpanded. There is elevation of the right hemidiaphragm. Vascular congestion is noted. Increased interstitial markings could reflect mild interstitial edema. No pleural effusion or pneumothorax is seen.  The cardiomediastinal silhouette is enlarged. No acute osseous abnormalities are identified.  IMPRESSION: Lungs hypoexpanded and difficult to fully assess, with chronic elevation of the right hemidiaphragm. Vascular congestion and cardiomegaly noted. Increased interstitial markings could reflect mild interstitial edema.   Electronically Signed   By: Garald Balding M.D.   On: 04/16/2014 21:56    Scheduled Meds: . aspirin EC  81 mg Oral Daily  . atorvastatin  40 mg Oral Daily  . azithromycin  500 mg Intravenous Q24H  . cholecalciferol  1,000 Units Oral Daily  . enoxaparin (LOVENOX) injection  40 mg Subcutaneous Q24H  . ferrous sulfate  325 mg Oral Q breakfast   . furosemide  20 mg Intravenous BID  . insulin glargine  40 Units Subcutaneous BID  . letrozole  2.5 mg Oral Daily  . levothyroxine  100 mcg Oral QAC breakfast  . lisinopril  10 mg Oral Daily  . loratadine  10 mg Oral Daily  . magnesium oxide  400 mg Oral BID  . methylPREDNISolone (SOLU-MEDROL) injection  60 mg Intravenous Q12H  . morphine  15 mg Oral Daily  . morphine  30 mg Oral QHS  . oseltamivir  75 mg Oral BID  . potassium chloride SA  20 mEq Oral Daily  . pregabalin  150 mg Oral BID  . rOPINIRole  2 mg Oral QHS  . sodium chloride  3 mL Intravenous Q12H  . sodium chloride  3 mL Intravenous Q12H   Continuous Infusions:      Time spent: >35 minutes     Panfilo Ketchum, MD [facility] [email or web address], password [facility] 04/18/2014, 8:46 AM  LOS: 2 days

## 2014-04-18 NOTE — Progress Notes (Signed)
Reports of lower extremity duplex noted-no DVT.

## 2014-04-18 NOTE — Progress Notes (Signed)
Patient is currently on 100% NRB and is in no distress resting comfortably. Sats are 100%. BIPAP is on standby if needed. RT informed patient to have nurse contact RT if BIPAP is needed at a later time. Will continue to monitor.

## 2014-04-19 LAB — BASIC METABOLIC PANEL
Anion gap: 8 (ref 5–15)
BUN: 36 mg/dL — AB (ref 6–23)
CO2: 34 mmol/L — ABNORMAL HIGH (ref 19–32)
Calcium: 9.3 mg/dL (ref 8.4–10.5)
Chloride: 97 mEq/L (ref 96–112)
Creatinine, Ser: 0.66 mg/dL (ref 0.50–1.10)
GFR calc Af Amer: >90 mL/min (ref >90)
GFR, EST NON AFRICAN AMERICAN: 88 mL/min — AB (ref >90)
Glucose, Bld: 165 mg/dL — ABNORMAL HIGH (ref 70–99)
POTASSIUM: 4.2 mmol/L (ref 3.5–5.1)
Sodium: 139 mmol/L (ref 135–145)

## 2014-04-19 LAB — CBC
HCT: 30.4 % — ABNORMAL LOW (ref 36.0–46.0)
Hemoglobin: 9.2 g/dL — ABNORMAL LOW (ref 12.0–15.0)
MCH: 25.3 pg — AB (ref 26.0–34.0)
MCHC: 30.3 g/dL (ref 30.0–36.0)
MCV: 83.7 fL (ref 78.0–100.0)
PLATELETS: 109 10*3/uL — AB (ref 150–400)
RBC: 3.63 MIL/uL — AB (ref 3.87–5.11)
RDW: 14.5 % (ref 11.5–15.5)
WBC: 6.1 10*3/uL (ref 4.0–10.5)

## 2014-04-19 LAB — GLUCOSE, CAPILLARY
GLUCOSE-CAPILLARY: 162 mg/dL — AB (ref 70–99)
Glucose-Capillary: 155 mg/dL — ABNORMAL HIGH (ref 70–99)
Glucose-Capillary: 221 mg/dL — ABNORMAL HIGH (ref 70–99)
Glucose-Capillary: 227 mg/dL — ABNORMAL HIGH (ref 70–99)

## 2014-04-19 MED ORDER — FUROSEMIDE 40 MG PO TABS
40.0000 mg | ORAL_TABLET | Freq: Every day | ORAL | Status: DC
  Administered 2014-04-19 – 2014-04-21 (×3): 40 mg via ORAL
  Filled 2014-04-19 (×3): qty 1

## 2014-04-19 MED ORDER — METHYLPREDNISOLONE SODIUM SUCC 40 MG IJ SOLR
40.0000 mg | Freq: Two times a day (BID) | INTRAMUSCULAR | Status: DC
  Administered 2014-04-19: 40 mg via INTRAVENOUS
  Filled 2014-04-19 (×3): qty 1

## 2014-04-19 NOTE — Progress Notes (Signed)
[facility]  Brooke Mccullough NIO:[id number] DOB: [birth year] DOA: 04/16/2014 PCP: Tammi Sou, MD   Brief narrative: 68 year old female with history of hypertension, dyslipidemia, diabetes, and COPD on chronic oxygen who presented with shortness of breath, cough, and fever. Patient's husband recently diagnosed with influenza in the past week. Chest x-ray consistent with interstitial pneumonitis. Patient had fever up to 101.4 in the ER. Significant hypoxemia requiring BiPAP. Mildly troponin positive but EKG unremarkable without evidence of ischemia.  Since admission patient has had issues of persistent hypoxemia requiring BiPAP and high flow oxygen. Because of recent ankle fracture with edema there were concerns for DVT but venous duplex was negative. Currently has weaned to 3 L oxygen.  HPI/Subjective: Awake but somewhat sleepy. States she is worried if we wean her oxygen. No chest pain. Denies cough.  Assessment/Plan:   Acute on hypoxic chronic respiratory failure due to presumed influenza with pneumonitis and COPD exacerbation -Husband positive for influenza so presumed patient also positive - continue Tamiflu - CTA neg for PE but revealed RLL and mild RUL infiltrate with adenopathy - numerous antibiotic allergies - cont zithromax for now - cont current dose of steroids - ck influenza PCR to confirm  Recent R ankle fracture with leg edema -Venous duplex negative for DVT  Known diastolic dysfunction -Currently compensated  Elevated troponin -Only mildly elevated and repeat ECG unremarkable which is consistent with demand ischemia - no chest pain   DM on insulin -Continue Lantus and sliding scale insulin - CBGs increasing as oral intake increases - monitor closely  HTN -cont Lisinopril - hydralazine added this admission  Chronic pain - cont MScontin and Oxycodone - discontinue IV narcotics   DVT prophylaxis: Lovenox Code Status:  Full Family Communication: No family at bedside Disposition Plan/Expected LOS: Stepdown - likely transfer to floor if remains stable on cannula oxygen overnight  Consultants: None  Procedures: Lower extremity venous duplex: Negative  Antibiotics: Zithromax 12/26 > Tamiflu 12/25 >  Objective: Blood pressure 164/59, pulse 76, temperature 98.7 F (37.1 C), temperature source Axillary, resp. rate 25, height 5\' 3"  (1.6 m), weight 223 lb 1.6 oz (101.197 kg), SpO2 96 %.  Intake/Output Summary (Last 24 hours) at 04/19/14 1315 Last data filed at 04/19/14 8182  Gross per 24 hour  Intake    113 ml  Output   1925 ml  Net  -1812 ml   Exam: Gen: No acute respiratory distress Chest: Clear to auscultation bilaterally without wheezes, rhonchi or crackles Cardiac: Regular rate and rhythm, S1-S2, no rubs murmurs or gallops Abdomen: Soft nontender nondistended without obvious hepatosplenomegaly, no ascites Extremities:   Scheduled Meds:  Scheduled Meds: . aspirin EC  81 mg Oral Daily  . atorvastatin  40 mg Oral Daily  . azithromycin  500 mg Intravenous Q24H  . cholecalciferol  1,000 Units Oral Daily  . enoxaparin (LOVENOX) injection  40 mg Subcutaneous Q24H  . ferrous sulfate  325 mg Oral Q breakfast  . furosemide  40 mg Oral Daily  . hydrALAZINE  25 mg Oral 3 times per day  . insulin aspart  0-15 Units Subcutaneous TID WC  . insulin aspart  0-5 Units Subcutaneous QHS  . insulin glargine  40 Units Subcutaneous BID  . letrozole  2.5 mg Oral Daily  . levothyroxine  100 mcg Oral QAC breakfast  . lisinopril  10 mg Oral Daily  . loratadine  10 mg Oral Daily  . magnesium oxide  400 mg  Oral BID  . methylPREDNISolone (SOLU-MEDROL) injection  60 mg Intravenous Q12H  . morphine  15 mg Oral Daily  . morphine  30 mg Oral QHS  . oseltamivir  75 mg Oral BID  . potassium chloride SA  20 mEq Oral Daily  . pregabalin  150 mg Oral BID  . rOPINIRole  2 mg Oral QHS  . sodium chloride  3 mL  Intravenous Q12H  . sodium chloride  3 mL Intravenous Q12H   Data Reviewed: Basic Metabolic Panel:  Recent Labs Lab 04/16/14 2104 04/17/14 0640 04/18/14 0345 04/19/14 0430  NA 137 141 139 139  K 4.7 4.4 4.3 4.2  CL 98 99 98 97  CO2 28 29 27  34*  GLUCOSE 169* 167* 217* 165*  BUN 36* 30* 28* 36*  CREATININE 0.91 0.89 0.64 0.66  CALCIUM 8.7 9.2 9.2 9.3   Liver Function Tests:  Recent Labs Lab 04/16/14 2104  AST 19  ALT 13  ALKPHOS 82  BILITOT 0.4  PROT 6.2  ALBUMIN 3.1*   CBC:  Recent Labs Lab 04/16/14 2104 04/17/14 0640 04/18/14 0345 04/19/14 0430  WBC 10.2 7.4 5.9 6.1  NEUTROABS 9.0*  --   --   --   HGB 9.4* 8.9* 9.2* 9.2*  HCT 29.7* 28.8* 30.4* 30.4*  MCV 82.7 83.5 83.1 83.7  PLT 118* 105* 97* 109*   Cardiac Enzymes:  Recent Labs Lab 04/17/14 0140 04/17/14 0640 04/17/14 1307 04/18/14 1035 04/18/14 1721 04/18/14 2109  CKTOTAL 26  --   --   --   --   --   CKMB 1.2  --   --   --   --   --   TROPONINI 0.20* 0.18* 0.20* 0.40* 0.27* 0.24*   BNP (last 3 results)  Recent Labs  09/28/13 1304 10/07/13 1908  PROBNP 356.8* 919.7*   CBG:  Recent Labs Lab 04/18/14 1239 04/18/14 1655 04/18/14 2152 04/19/14 0714 04/19/14 1221  GLUCAP 174* 216* 184* 155* 221*    Recent Results (from the past 240 hour(s))  Culture, blood (routine x 2)     Status: None (Preliminary result)   Collection Time: 04/16/14  8:50 PM  Result Value Ref Range Status   Specimen Description BLOOD LEFT FOREARM  Final   Special Requests BOTTLES DRAWN AEROBIC AND ANAEROBIC 5CC EACH  Final   Culture   Final           BLOOD CULTURE RECEIVED NO GROWTH TO DATE CULTURE WILL BE HELD FOR 5 DAYS BEFORE ISSUING A FINAL NEGATIVE REPORT Performed at [facility]    Report Status PENDING  Incomplete  Culture, blood (routine x 2)     Status: None (Preliminary result)   Collection Time: 04/16/14  9:11 PM  Result Value Ref Range Status   Specimen Description BLOOD LEFT HAND   Final   Special Requests BOTTLES DRAWN AEROBIC AND ANAEROBIC 5CC EACH  Final   Culture   Final           BLOOD CULTURE RECEIVED NO GROWTH TO DATE CULTURE WILL BE HELD FOR 5 DAYS BEFORE ISSUING A FINAL NEGATIVE REPORT Performed at [facility]    Report Status PENDING  Incomplete  Urine culture     Status: None   Collection Time: 04/16/14  9:25 PM  Result Value Ref Range Status   Specimen Description URINE, CATHETERIZED  Final   Special Requests NONE  Final   Colony Count NO GROWTH Performed at [facility]  Final   Culture NO GROWTH Performed at [facility]   Final   Report Status 04/18/2014 FINAL  Final     Studies:  Recent x-ray studies have been reviewed in detail by the Attending Physician  Time spent :  [place], ANP [facility] Office  [phone] Pager [phone]  On-Call/Text Page:      [email or web address]      password [email or web address]  If 7PM-7AM, please contact night-coverage [email or web address] Password [email or web address] 04/19/2014, 1:15 PM   LOS: 3 days   I have personally examined this patient and reviewed the entire database. I have reviewed the above note, made any necessary editorial changes, and agree with its content.  Cherene Altes, MD [facility]

## 2014-04-19 NOTE — Progress Notes (Signed)
Inpatient Diabetes Program Recommendations  AACE/ADA: New Consensus Statement on Inpatient Glycemic Control (2013)  Target Ranges:  Prepandial:   less than 140 mg/dL      Peak postprandial:   less than 180 mg/dL (1-2 hours)      Critically ill patients:  140 - 180 mg/dL   Results for GISSEL, KEILMAN (MRN [id number]) as of 04/19/2014 14:27  Ref. Range 04/18/2014 08:25 04/18/2014 12:39 04/18/2014 16:55 04/18/2014 21:52 04/19/2014 07:14 04/19/2014 12:21  Glucose-Capillary Latest Range: 70-99 mg/dL 190 (H) 174 (H) 216 (H) 184 (H) 155 (H) 221 (H)    Diabetes history: DM2 Outpatient Diabetes medications: Novolog 6 units TID with meals, Lantus 40 units BID. Current orders for Inpatient glycemic control: Novolog 0-15 units TID, Novolog 0-5 units bedtime scale  Inpatient Diabetes Program Recommendations Insulin - Meal Coverage: Noted postprandial glucose consistently elevated. While ordered steroids, please consider ordering Novolog 3 units TID with meals if pt is eating at least 50% of meals.  Thanks, Barnie Alderman, RN, MSN, CCRN, CDE Diabetes Coordinator Inpatient Diabetes Program [phone] (Team Pager) [phone] ([facility] office) [phone] [facility] office)

## 2014-04-19 NOTE — Care Management Note (Addendum)
    Page 1 of 1   04/29/2014     5:16:28 PM CARE MANAGEMENT NOTE 04/29/2014  Patient:  Brooke Mccullough, Brooke Mccullough   Account Number:  [id number]  Date Initiated:  04/19/2014  Documentation initiated by:  Elissa Hefty  Subjective/Objective Assessment:   adm w shortness of breath     Action/Plan:   lives w husband, pcp dr Julien Nordmann   Anticipated DC Date:  04/29/2014   Anticipated DC Plan:  [facility] referral  Clinical Social Worker      DC Planning Services  CM consult      Choice offered to / List presented to:             Status of service:  Completed, signed off Medicare Important Message given?  YES (If response is "NO", the following Medicare IM given date fields will be blank) Date Medicare IM given:  04/19/2014 Medicare IM given by:  Elissa Hefty Date Additional Medicare IM given:  04/29/2014 Additional Medicare IM given by:  Tomi Bamberger  Discharge Disposition:  [place]  Per UR Regulation:  Reviewed for med. necessity/level of care/duration of stay  If discussed at [place] of Stay Meetings, dates discussed:    Comments:  04/26/14 [place], BSN [phone] patient for dc to snf today.  04/22/14 [place] MSN BSN CCM PT recommends SNF, CSW notified.

## 2014-04-20 ENCOUNTER — Inpatient Hospital Stay (HOSPITAL_COMMUNITY): Payer: Medicare Other

## 2014-04-20 DIAGNOSIS — R778 Other specified abnormalities of plasma proteins: Secondary | ICD-10-CM | POA: Insufficient documentation

## 2014-04-20 DIAGNOSIS — G894 Chronic pain syndrome: Secondary | ICD-10-CM

## 2014-04-20 DIAGNOSIS — J9621 Acute and chronic respiratory failure with hypoxia: Secondary | ICD-10-CM | POA: Insufficient documentation

## 2014-04-20 DIAGNOSIS — D649 Anemia, unspecified: Secondary | ICD-10-CM | POA: Insufficient documentation

## 2014-04-20 DIAGNOSIS — I519 Heart disease, unspecified: Secondary | ICD-10-CM

## 2014-04-20 DIAGNOSIS — I5033 Acute on chronic diastolic (congestive) heart failure: Secondary | ICD-10-CM | POA: Insufficient documentation

## 2014-04-20 DIAGNOSIS — I10 Essential (primary) hypertension: Secondary | ICD-10-CM | POA: Insufficient documentation

## 2014-04-20 DIAGNOSIS — R7989 Other specified abnormal findings of blood chemistry: Secondary | ICD-10-CM

## 2014-04-20 DIAGNOSIS — D696 Thrombocytopenia, unspecified: Secondary | ICD-10-CM | POA: Insufficient documentation

## 2014-04-20 DIAGNOSIS — S82891A Other fracture of right lower leg, initial encounter for closed fracture: Secondary | ICD-10-CM | POA: Insufficient documentation

## 2014-04-20 LAB — BASIC METABOLIC PANEL
ANION GAP: 11 (ref 5–15)
BUN: 38 mg/dL — ABNORMAL HIGH (ref 6–23)
CALCIUM: 9.1 mg/dL (ref 8.4–10.5)
CO2: 28 mmol/L (ref 19–32)
Chloride: 98 mEq/L (ref 96–112)
Creatinine, Ser: 0.72 mg/dL (ref 0.50–1.10)
GFR calc Af Amer: >90 mL/min (ref >90)
GFR calc non Af Amer: 86 mL/min — ABNORMAL LOW (ref >90)
GLUCOSE: 184 mg/dL — AB (ref 70–99)
POTASSIUM: 4.3 mmol/L (ref 3.5–5.1)
SODIUM: 137 mmol/L (ref 135–145)

## 2014-04-20 LAB — CBC
HEMATOCRIT: 29.1 % — AB (ref 36.0–46.0)
Hemoglobin: 9 g/dL — ABNORMAL LOW (ref 12.0–15.0)
MCH: 25.4 pg — ABNORMAL LOW (ref 26.0–34.0)
MCHC: 30.9 g/dL (ref 30.0–36.0)
MCV: 82 fL (ref 78.0–100.0)
Platelets: 93 10*3/uL — ABNORMAL LOW (ref 150–400)
RBC: 3.55 MIL/uL — AB (ref 3.87–5.11)
RDW: 14.1 % (ref 11.5–15.5)
WBC: 5.4 10*3/uL (ref 4.0–10.5)

## 2014-04-20 LAB — BLOOD GAS, ARTERIAL
Acid-Base Excess: 4.4 mmol/L — ABNORMAL HIGH (ref 0.0–2.0)
BICARBONATE: 28.4 meq/L — AB (ref 20.0–24.0)
Drawn by: 36527
O2 Content: 4 L/min
O2 Saturation: 95.2 %
PH ART: 7.446 (ref 7.350–7.450)
PO2 ART: 69 mmHg — AB (ref 80.0–100.0)
Patient temperature: 98.1
TCO2: 29.7 mmol/L (ref 0–100)
pCO2 arterial: 41.8 mmHg (ref 35.0–45.0)

## 2014-04-20 LAB — URINALYSIS, ROUTINE W REFLEX MICROSCOPIC
Bilirubin Urine: NEGATIVE
Glucose, UA: NEGATIVE mg/dL
Hgb urine dipstick: NEGATIVE
Ketones, ur: NEGATIVE mg/dL
LEUKOCYTES UA: NEGATIVE
Nitrite: NEGATIVE
PROTEIN: 100 mg/dL — AB
SPECIFIC GRAVITY, URINE: 1.011 (ref 1.005–1.030)
Urobilinogen, UA: 0.2 mg/dL (ref 0.0–1.0)
pH: 7.5 (ref 5.0–8.0)

## 2014-04-20 LAB — GLUCOSE, CAPILLARY
GLUCOSE-CAPILLARY: 194 mg/dL — AB (ref 70–99)
GLUCOSE-CAPILLARY: 205 mg/dL — AB (ref 70–99)
Glucose-Capillary: 156 mg/dL — ABNORMAL HIGH (ref 70–99)
Glucose-Capillary: 208 mg/dL — ABNORMAL HIGH (ref 70–99)

## 2014-04-20 LAB — INFLUENZA PANEL BY PCR (TYPE A & B)
H1N1 flu by pcr: NOT DETECTED
INFLAPCR: NEGATIVE
Influenza B By PCR: NEGATIVE

## 2014-04-20 LAB — URINE MICROSCOPIC-ADD ON

## 2014-04-20 LAB — RETICULOCYTES
RBC.: 3.62 MIL/uL — ABNORMAL LOW (ref 3.87–5.11)
RETIC CT PCT: 2.1 % (ref 0.4–3.1)
Retic Count, Absolute: 76 10*3/uL (ref 19.0–186.0)

## 2014-04-20 MED ORDER — FUROSEMIDE 10 MG/ML IJ SOLN
INTRAMUSCULAR | Status: AC
  Filled 2014-04-20: qty 8

## 2014-04-20 MED ORDER — METHYLPREDNISOLONE SODIUM SUCC 125 MG IJ SOLR
80.0000 mg | Freq: Every day | INTRAMUSCULAR | Status: DC
  Administered 2014-04-21: 80 mg via INTRAVENOUS
  Filled 2014-04-20: qty 2
  Filled 2014-04-20: qty 1.28

## 2014-04-20 MED ORDER — IPRATROPIUM-ALBUTEROL 0.5-2.5 (3) MG/3ML IN SOLN
3.0000 mL | RESPIRATORY_TRACT | Status: DC
  Administered 2014-04-20 – 2014-04-21 (×5): 3 mL via RESPIRATORY_TRACT
  Filled 2014-04-20 (×6): qty 3

## 2014-04-20 MED ORDER — METHYLPREDNISOLONE SODIUM SUCC 40 MG IJ SOLR
40.0000 mg | Freq: Every day | INTRAMUSCULAR | Status: DC
  Administered 2014-04-20: 40 mg via INTRAVENOUS
  Filled 2014-04-20: qty 1

## 2014-04-20 MED ORDER — INSULIN ASPART 100 UNIT/ML ~~LOC~~ SOLN
3.0000 [IU] | Freq: Three times a day (TID) | SUBCUTANEOUS | Status: DC
  Administered 2014-04-20 – 2014-04-26 (×14): 3 [IU] via SUBCUTANEOUS

## 2014-04-20 MED ORDER — FUROSEMIDE 10 MG/ML IJ SOLN
60.0000 mg | Freq: Once | INTRAMUSCULAR | Status: AC
  Administered 2014-04-20: 60 mg via INTRAVENOUS

## 2014-04-20 NOTE — Progress Notes (Signed)
Inpatient Diabetes Program Recommendations  AACE/ADA: New Consensus Statement on Inpatient Glycemic Control (2013)  Target Ranges:  Prepandial:   less than 140 mg/dL      Peak postprandial:   less than 180 mg/dL (1-2 hours)      Critically ill patients:  140 - 180 mg/dL   Results for Brooke Mccullough, Brooke Mccullough (MRN [id number]) as of 04/20/2014 08:28  Ref. Range 04/18/2014 21:52 04/19/2014 07:14 04/19/2014 12:21 04/19/2014 16:41 04/19/2014 21:21  Glucose-Capillary Latest Range: 70-99 mg/dL 184 (H) 155 (H) 221 (H) 162 (H) 227 (H)    Diabetes history: DM2 Outpatient Diabetes medications: Novolog 6 units TID with meals, Lantus 40 units BID. Current orders for Inpatient glycemic control: Novolog 0-15 units TID, Novolog 0-5 units bedtime scale, Lantus 40 units bid  Noted postprandial glucose consistently elevated. While ordered steroids, please consider ordering Novolog 3 units TID with meals if pt is eating at least 50% of meals.  Gentry Fitz, RN, BA, MHA, CDE Diabetes Coordinator Inpatient Diabetes Program  [phone] (Team Pager) [phone] [facility] Office) 04/20/2014 8:28 AM

## 2014-04-20 NOTE — Progress Notes (Signed)
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 Patient's husband recently diagnosed with influenza in the past week. Chest x-ray consistent with interstitial pneumonitis. Patient had fever up to 101.4 in the ER. Significant hypoxemia requiring BiPAP. Mildly troponin positive but EKG unremarkable without evidence of ischemia.  Since admission patient has had issues of persistent hypoxemia requiring BiPAP and high flow oxygen. Because of recent ankle fracture with edema there were concerns for DVT but venous duplex was negative. Currently has weaned to 3 L oxygen.  HPI/Subjective: Alert and doing well earlier in am- primarily complained of insomnia; after lunch pt reported difficulty breathing without O2 desat but tachycardia to 140s- RN reported increased bilateral rhonchi new from earlier today (see below)  Assessment/Plan:   Acute on hypoxic chronic respiratory failure due to presumed influenza with pneumonitis and COPD exacerbation -Husband positive for influenza so presumed patient also positive; influenza PCR obtained AFTER tx was negative - continue Tamiflu for now- CTA neg for PE but revealed RLL and mild RUL infiltrate with adenopathy - numerous antibiotic allergies - cont zithromax  - taper steroids -  ** ABG with stable hypoxia, normal pH- PCXR: appears to be developing a left pleural effusion; will give an extra dose of Lasix IV-cancel planned transfer  Recent R ankle fracture with leg edema -Venous duplex negative for DVT  Known diastolic dysfunction -Currently compensated-cont PO Lasix home dose  Elevated troponin -Only mildly elevated and repeat ECG unremarkable which was consistent with demand ischemia - no chest pain    DM on insulin -Continue Lantus and sliding scale insulin - CBGs increasing as oral intake increases - add meal coverage until off steroids  Thrombocytopenia/Anemia -? Etiology-appears chronic with baseline 90,000 range- also has anemia so ? MDS-ck anemia panel-consider ck LDH and haptoglobin  HTN -cont Lisinopril - hydralazine added this admission  Chronic pain - cont MS contin and Oxycodone   DVT prophylaxis: Lovenox Code Status: Full Family Communication: No family at bedside Disposition Plan/Expected LOS: Stepdown   Consultants: None  Procedures: Lower extremity venous duplex: Negative  Antibiotics: Zithromax 12/26 > Tamiflu 12/25 >  Objective: Blood pressure 143/50, pulse 104, temperature 98.1 F (36.7 C), temperature source Oral, resp. rate 23, height 5\' 3"  (1.6 m), weight 225 lb 15.5 oz (102.5 kg), SpO2 97 %.  Intake/Output Summary (Last 24 hours) at 04/20/14 1424 Last data filed at 04/20/14 0830  Gross per 24 hour  Intake      0 ml  Output   2700 ml  Net  -2700 ml   Exam: Gen: No acute respiratory distress Chest: Clear to auscultation bilaterally without wheezes, rhonchi or crackles, 3L- later with rhonchi/crackles and abnormal CXR Cardiac: Regular rate and rhythm, S1-S2, no rubs murmurs or gallops Abdomen: Soft nontender nondistended without obvious hepatosplenomegaly, no ascites Extremities: symmetrical, no effusion or cyanosis  Scheduled Meds:  Scheduled Meds: . aspirin EC  81 mg Oral Daily  . atorvastatin  40 mg Oral Daily  . azithromycin  500 mg Intravenous Q24H  . cholecalciferol  1,000 Units Oral Daily  . enoxaparin (LOVENOX) injection  40 mg Subcutaneous Q24H  . ferrous sulfate  325 mg Oral Q breakfast  . furosemide  40 mg Oral Daily  . hydrALAZINE  25 mg Oral 3 times per  day  . insulin aspart  0-15 Units Subcutaneous TID WC  . insulin aspart  0-5 Units Subcutaneous QHS  . insulin glargine  40 Units Subcutaneous BID  . letrozole  2.5 mg  Oral Daily  . levothyroxine  100 mcg Oral QAC breakfast  . lisinopril  10 mg Oral Daily  . loratadine  10 mg Oral Daily  . magnesium oxide  400 mg Oral BID  . methylPREDNISolone (SOLU-MEDROL) injection  40 mg Intravenous Daily  . morphine  15 mg Oral Daily  . morphine  30 mg Oral QHS  . oseltamivir  75 mg Oral BID  . potassium chloride SA  20 mEq Oral Daily  . pregabalin  150 mg Oral BID  . rOPINIRole  2 mg Oral QHS   Data Reviewed: Basic Metabolic Panel:  Recent Labs Lab 04/16/14 2104 04/17/14 0640 04/18/14 0345 04/19/14 0430 04/20/14 0009  NA 137 141 139 139 137  K 4.7 4.4 4.3 4.2 4.3  CL 98 99 98 97 98  CO2 28 29 27  34* 28  GLUCOSE 169* 167* 217* 165* 184*  BUN 36* 30* 28* 36* 38*  CREATININE 0.91 0.89 0.64 0.66 0.72  CALCIUM 8.7 9.2 9.2 9.3 9.1   Liver Function Tests:  Recent Labs Lab 04/16/14 2104  AST 19  ALT 13  ALKPHOS 82  BILITOT 0.4  PROT 6.2  ALBUMIN 3.1*   CBC:  Recent Labs Lab 04/16/14 2104 04/17/14 0640 04/18/14 0345 04/19/14 0430 04/20/14 0009  WBC 10.2 7.4 5.9 6.1 5.4  NEUTROABS 9.0*  --   --   --   --   HGB 9.4* 8.9* 9.2* 9.2* 9.0*  HCT 29.7* 28.8* 30.4* 30.4* 29.1*  MCV 82.7 83.5 83.1 83.7 82.0  PLT 118* 105* 97* 109* 93*   Cardiac Enzymes:  Recent Labs Lab 04/17/14 0140 04/17/14 0640 04/17/14 1307 04/18/14 1035 04/18/14 1721 04/18/14 2109  CKTOTAL 26  --   --   --   --   --   CKMB 1.2  --   --   --   --   --   TROPONINI 0.20* 0.18* 0.20* 0.40* 0.27* 0.24*   BNP (last 3 results)  Recent Labs  09/28/13 1304 10/07/13 1908  PROBNP 356.8* 919.7*   CBG:  Recent Labs Lab 04/19/14 1221 04/19/14 1641 04/19/14 2121 04/20/14 0824 04/20/14 1153  GLUCAP 221* 162* 227* 156* 208*    Recent Results (from the past 240 hour(s))  Culture, blood (routine x 2)     Status: None (Preliminary result)   Collection Time: 04/16/14  8:50 PM  Result Value Ref Range Status   Specimen Description BLOOD LEFT FOREARM  Final    Special Requests BOTTLES DRAWN AEROBIC AND ANAEROBIC 5CC EACH  Final   Culture   Final           BLOOD CULTURE RECEIVED NO GROWTH TO DATE CULTURE WILL BE HELD FOR 5 DAYS BEFORE ISSUING A FINAL NEGATIVE REPORT Performed at [facility]    Report Status PENDING  Incomplete  Culture, blood (routine x 2)     Status: None (Preliminary result)   Collection Time: 04/16/14  9:11 PM  Result Value Ref Range Status   Specimen Description BLOOD LEFT HAND  Final   Special Requests BOTTLES DRAWN AEROBIC AND ANAEROBIC 5CC EACH  Final   Culture   Final           BLOOD CULTURE RECEIVED NO GROWTH TO DATE CULTURE WILL BE HELD FOR 5  DAYS BEFORE ISSUING A FINAL NEGATIVE REPORT Performed at [facility]    Report Status PENDING  Incomplete  Urine culture     Status: None   Collection Time: 04/16/14  9:25 PM  Result Value Ref Range Status   Specimen Description URINE, CATHETERIZED  Final   Special Requests NONE  Final   Colony Count NO GROWTH Performed at [facility]   Final   Culture NO GROWTH Performed at [facility]   Final   Report Status 04/18/2014 FINAL  Final     Studies:  Recent x-ray studies have been reviewed in detail by the Attending Physician  Time spent :  35 mins  Erin Hearing, ANP [facility] Office  [phone] Pager [phone]  On-Call/Text Page:      [email or web address]      password [email or web address]  If 7PM-7AM, please contact night-coverage [email or web address] Password [email or web address] 04/20/2014, 2:24 PM   LOS: 4 days   Examined Patient and discussed A&P with ANP Ebony Hail and agree with above plan.  I have reviewed the entire database. I have made any necessary editorial changes, and agree with its content. Pt with Multiple Complex medical problems> 35 min spent in direct Pt care   Dia Crawford, MD  [facility] [phone] pager

## 2014-04-21 LAB — GLUCOSE, CAPILLARY
GLUCOSE-CAPILLARY: 192 mg/dL — AB (ref 70–99)
GLUCOSE-CAPILLARY: 217 mg/dL — AB (ref 70–99)
Glucose-Capillary: 103 mg/dL — ABNORMAL HIGH (ref 70–99)
Glucose-Capillary: 203 mg/dL — ABNORMAL HIGH (ref 70–99)
Glucose-Capillary: 241 mg/dL — ABNORMAL HIGH (ref 70–99)
Glucose-Capillary: 222 mg/dL — ABNORMAL HIGH (ref 70–99)

## 2014-04-21 LAB — FERRITIN: Ferritin: 158 ng/mL (ref 10–291)

## 2014-04-21 LAB — BASIC METABOLIC PANEL
Anion gap: 8 (ref 5–15)
BUN: 32 mg/dL — ABNORMAL HIGH (ref 6–23)
CHLORIDE: 97 meq/L (ref 96–112)
CO2: 34 mmol/L — ABNORMAL HIGH (ref 19–32)
Calcium: 9.1 mg/dL (ref 8.4–10.5)
Creatinine, Ser: 0.74 mg/dL (ref 0.50–1.10)
GFR calc Af Amer: >90 mL/min (ref >90)
GFR, EST NON AFRICAN AMERICAN: 85 mL/min — AB (ref >90)
GLUCOSE: 131 mg/dL — AB (ref 70–99)
POTASSIUM: 3.8 mmol/L (ref 3.5–5.1)
SODIUM: 139 mmol/L (ref 135–145)

## 2014-04-21 LAB — IRON AND TIBC
Iron: 54 ug/dL (ref 42–145)
Saturation Ratios: 22 % (ref 20–55)
TIBC: 245 ug/dL — AB (ref 250–470)
UIBC: 191 ug/dL (ref 125–400)

## 2014-04-21 LAB — FOLATE: FOLATE: 12 ng/mL

## 2014-04-21 LAB — URINE CULTURE
Colony Count: NO GROWTH
Culture: NO GROWTH

## 2014-04-21 LAB — VITAMIN B12: VITAMIN B 12: 1307 pg/mL — AB (ref 211–911)

## 2014-04-21 MED ORDER — IPRATROPIUM-ALBUTEROL 0.5-2.5 (3) MG/3ML IN SOLN
3.0000 mL | Freq: Four times a day (QID) | RESPIRATORY_TRACT | Status: DC
  Administered 2014-04-21 – 2014-04-25 (×13): 3 mL via RESPIRATORY_TRACT
  Filled 2014-04-21 (×14): qty 3

## 2014-04-21 MED ORDER — FUROSEMIDE 10 MG/ML IJ SOLN
40.0000 mg | Freq: Two times a day (BID) | INTRAMUSCULAR | Status: DC
  Administered 2014-04-21 – 2014-04-24 (×7): 40 mg via INTRAVENOUS
  Filled 2014-04-21 (×11): qty 4

## 2014-04-21 NOTE — Evaluation (Signed)
Physical Therapy Evaluation Patient Details Name: Brooke Mccullough MRN: [id number] DOB: March 22, 1946 Today's Date: 04/21/2014   History of Present Illness  Pt 68 yo female admitted 12/25 with cough and SOB. Pt with h/o recent R ankle fx and old L ankle fax. Pt with CHF, anxiety and COPD.  Clinical Impression  Pt presenting with SOB and anxiety in addition to R LE NWB limiting ambulation. Pt with increased BP once OOB to chair of 252/115 RN aware. Acute PT to follow to progress mobility and strengthening as able.    Follow Up Recommendations SNF;Supervision/Assistance - 24 hour    Equipment Recommendations  None recommended by PT    Recommendations for Other Services       Precautions / Restrictions Precautions Precautions: Fall Required Braces or Orthoses:  (R CAM boot) Restrictions Weight Bearing Restrictions: Yes RLE Weight Bearing: Non weight bearing      Mobility  Bed Mobility Overal bed mobility: Needs Assistance Bed Mobility: Supine to Sit     Supine to sit: HOB elevated;Mod assist     General bed mobility comments: assist to bring hips to EOB  Transfers Overall transfer level: Needs assistance Equipment used: Rolling walker (2 wheeled) Transfers: Sit to/from [facility] Sit to Stand: Mod assist;+2 physical assistance Stand pivot transfers: Min assist;+2 physical assistance       General transfer comment: pt with significant difficulties achieving standing and was non-compliant with R LE NWB. Pt able to stand pvt however again was non-compliant with R LE NWB. Pt reports "He said i could just put my toes down" Pt was able to pivot on ball of L foot however con't to put down R foot despite cueing  Ambulation/Gait Ambulation/Gait assistance:  (pt unable)              Stairs            Wheelchair Mobility    Modified Rankin (Stroke Patients Only)       Balance Overall balance assessment: Needs assistance          Standing balance support: Bilateral upper extremity supported Standing balance-Leahy Scale: Poor Standing balance comment: needs RW due to R LE NWB                             Pertinent Vitals/Pain Pain Assessment: No/denies pain    Home Living Family/patient expects to be discharged to:: Skilled nursing facility                 Additional Comments: lives at [facility] and is attending rehab since R ankle fracture    Prior Function Level of Independence:  (was at rehab for R ankle fx, spouse at indep living apt)               Hand Dominance   Dominant Hand: Right    Extremity/Trunk Assessment   Upper Extremity Assessment: Generalized weakness           Lower Extremity Assessment: RLE deficits/detail;LLE deficits/detail RLE Deficits / Details: no ankle ROM due to CAM boot, hip/knee grossly 3/5 LLE Deficits / Details: grossly 4-/5  Cervical / Trunk Assessment: Normal  Communication   Communication: No difficulties  Cognition Arousal/Alertness: Awake/alert Behavior During Therapy: WFL for tasks assessed/performed (became anxious s/p getting to chair due to SOB) Overall Cognitive Status: Within Functional Limits for tasks assessed  General Comments General comments (skin integrity, edema, etc.): pt became extremely anxious s/p getting into chair with report of HA. BP increased to 252/115. RN notified and came to room to assess pt    Exercises        Assessment/Plan    PT Assessment Patient needs continued PT services  PT Diagnosis Difficulty walking;Generalized weakness   PT Problem List Decreased strength;Decreased range of motion;Decreased activity tolerance;Decreased balance;Cardiopulmonary status limiting activity  PT Treatment Interventions DME instruction;Gait training;Functional mobility training;Therapeutic activities;Therapeutic exercise   PT Goals (Current goals can be found in the Care Plan  section) Acute Rehab PT Goals Patient Stated Goal: to breathe better PT Goal Formulation: With patient Time For Goal Achievement: 04/28/14 Potential to Achieve Goals: Good    Frequency Min 2X/week   Barriers to discharge        Co-evaluation               End of Session Equipment Utilized During Treatment: Gait belt;Oxygen (R Cam boot) Activity Tolerance: Patient limited by fatigue Patient left: in chair;with call bell/phone within reach;with nursing/sitter in room Nurse Communication: Mobility status (high BP)         Time: 6295-2841 PT Time Calculation (min) (ACUTE ONLY): 20 min   Charges:   PT Evaluation $Initial PT Evaluation Tier I: 1 Procedure PT Treatments $Therapeutic Activity: 8-22 mins   PT G Codes:        Kingsley Callander 04/21/2014, 1:22 PM   Kittie Plater, PT, DPT Pager #: [phone] Office #: [phone]

## 2014-04-21 NOTE — Progress Notes (Signed)
[facility]  Brooke Mccullough DDU:[id number] DOB: [birth year] DOA: 04/16/2014 PCP: Tammi Sou, MD   Brief narrative: 69 year old female with history of hypertension, dyslipidemia, diabetes, and COPD on chronic oxygen who presented with shortness of breath, cough, and fever. Patient's husband recently diagnosed with influenza. Chest x-ray consistent with interstitial pneumonitis. Patient had fever up to 101.4 in the ER. Significant hypoxemia requiring BiPAP. Mildly troponin positive but EKG unremarkable without evidence of ischemia.  Since admission patient has had issues of persistent hypoxemia requiring BiPAP and high flow oxygen. Because of recent ankle fracture with edema there were concerns for DVT but venous duplex was negative. Currently has weaned to 3 L oxygen.  HPI/Subjective: Breathing a little better but still endorsing orthopnea  Assessment/Plan:   Acute on chronic hypoxic respiratory failure due to pneumonitis and COPD exacerbation +/- pulmonary edema due to acute diastolic CHF exacerbation  -Husband positive for influenza so initailly presumed patient also positive; influenza PCR was negative, but has already complete course of tamiflu - CTA neg for PE but revealed RLL and mild RUL infiltrate with adenopathy - numerous antibiotic allergies - cont zithromax  - taper steroids - has diuresed >4L since extra IV Lasix 12/29 so will begin Lasix 40 mg IV BID and reevaluate daily  Acute decompensated diastolic dysfunction -decompensated 12/29 - PO Lasix transitioned to IV (see above) - net negative 13L since admit - weight down to 97.6kg from 101 initially - has been as low as 91kg in July of this year - cont diuresis - this seems a bit extreme for "grade 1 DD" - will recheck TTE to assure no new component of systolic dysfxn to suggest acute event or CAD   Recent R ankle fracture with leg edema -Venous duplex negative for DVT  Elevated  troponin -Only mildly elevated and repeat ECG unremarkable consistent with demand ischemia - no chest pain   DM on insulin -Continue Lantus and sliding scale insulin - CBGs increasing as oral intake increases - cont meal coverage until off steroids  Thrombocytopenia/Anemia -? Etiology - appears chronic with baseline 90,000 range - anemia panel unremarkable  HTN -cont Lisinopril - hydralazine added this admission  Chronic pain - cont MS contin and Oxycodone   DVT prophylaxis: Lovenox Code Status: Full Family Communication: No family at bedside Disposition Plan/Expected LOS: Stepdown   Consultants: None  Procedures: Lower extremity venous duplex: Negative  Antibiotics: Zithromax 12/26 > Tamiflu 12/25 > 12/30  Objective: Blood pressure 154/34, pulse 87, temperature 97.7 F (36.5 C), temperature source Oral, resp. rate 22, height 5\' 3"  (1.6 m), weight 215 lb 2.7 oz (97.6 kg), SpO2 95 %.  Intake/Output Summary (Last 24 hours) at 04/21/14 1418 Last data filed at 04/21/14 1156  Gross per 24 hour  Intake    240 ml  Output   3475 ml  Net  -3235 ml   Exam: Gen: No acute respiratory distress Chest: Diffuse bilateral crackles, 3 L Cardiac: Regular rate and rhythm, S1-S2, no rubs murmurs or gallops Abdomen: Soft nontender nondistended without obvious hepatosplenomegaly, no ascites Extremities: symmetrical, no effusion or cyanosis  Scheduled Meds:  Scheduled Meds: . aspirin EC  81 mg Oral Daily  . atorvastatin  40 mg Oral Daily  . azithromycin  500 mg Intravenous Q24H  . cholecalciferol  1,000 Units Oral Daily  . enoxaparin (LOVENOX) injection  40 mg Subcutaneous Q24H  . ferrous sulfate  325 mg Oral Q breakfast  . furosemide  40 mg Intravenous BID  . hydrALAZINE  25 mg Oral 3 times per day  . insulin aspart  0-15 Units Subcutaneous TID WC  . insulin aspart  0-5 Units Subcutaneous QHS  . insulin aspart  3 Units Subcutaneous TID WC  . insulin glargine  40 Units  Subcutaneous BID  . ipratropium-albuterol  3 mL Nebulization Q4H  . letrozole  2.5 mg Oral Daily  . levothyroxine  100 mcg Oral QAC breakfast  . lisinopril  10 mg Oral Daily  . loratadine  10 mg Oral Daily  . magnesium oxide  400 mg Oral BID  . methylPREDNISolone (SOLU-MEDROL) injection  80 mg Intravenous Daily  . morphine  15 mg Oral Daily  . morphine  30 mg Oral QHS  . potassium chloride SA  20 mEq Oral Daily  . pregabalin  150 mg Oral BID  . rOPINIRole  2 mg Oral QHS   Data Reviewed: Basic Metabolic Panel:  Recent Labs Lab 04/17/14 0640 04/18/14 0345 04/19/14 0430 04/20/14 0009 04/21/14 0250  NA 141 139 139 137 139  K 4.4 4.3 4.2 4.3 3.8  CL 99 98 97 98 97  CO2 29 27 34* 28 34*  GLUCOSE 167* 217* 165* 184* 131*  BUN 30* 28* 36* 38* 32*  CREATININE 0.89 0.64 0.66 0.72 0.74  CALCIUM 9.2 9.2 9.3 9.1 9.1   Liver Function Tests:  Recent Labs Lab 04/16/14 2104  AST 19  ALT 13  ALKPHOS 82  BILITOT 0.4  PROT 6.2  ALBUMIN 3.1*   CBC:  Recent Labs Lab 04/16/14 2104 04/17/14 0640 04/18/14 0345 04/19/14 0430 04/20/14 0009  WBC 10.2 7.4 5.9 6.1 5.4  NEUTROABS 9.0*  --   --   --   --   HGB 9.4* 8.9* 9.2* 9.2* 9.0*  HCT 29.7* 28.8* 30.4* 30.4* 29.1*  MCV 82.7 83.5 83.1 83.7 82.0  PLT 118* 105* 97* 109* 93*   Cardiac Enzymes:  Recent Labs Lab 04/17/14 0140 04/17/14 0640 04/17/14 1307 04/18/14 1035 04/18/14 1721 04/18/14 2109  CKTOTAL 26  --   --   --   --   --   CKMB 1.2  --   --   --   --   --   TROPONINI 0.20* 0.18* 0.20* 0.40* 0.27* 0.24*   BNP (last 3 results)  Recent Labs  09/28/13 1304 10/07/13 1908  PROBNP 356.8* 919.7*   CBG:  Recent Labs Lab 04/20/14 1653 04/20/14 2126 04/21/14 0716 04/21/14 1153 04/21/14 1409  GLUCAP 194* 205* 103* 203* 192*    Recent Results (from the past 240 hour(s))  Culture, blood (routine x 2)     Status: None (Preliminary result)   Collection Time: 04/16/14  8:50 PM  Result Value Ref Range Status    Specimen Description BLOOD LEFT FOREARM  Final   Special Requests BOTTLES DRAWN AEROBIC AND ANAEROBIC 5CC EACH  Final   Culture   Final           BLOOD CULTURE RECEIVED NO GROWTH TO DATE CULTURE WILL BE HELD FOR 5 DAYS BEFORE ISSUING A FINAL NEGATIVE REPORT Performed at [facility]    Report Status PENDING  Incomplete  Culture, blood (routine x 2)     Status: None (Preliminary result)   Collection Time: 04/16/14  9:11 PM  Result Value Ref Range Status   Specimen Description BLOOD LEFT HAND  Final   Special Requests BOTTLES DRAWN AEROBIC AND ANAEROBIC [facility] EACH  Final   Culture  Final           BLOOD CULTURE RECEIVED NO GROWTH TO DATE CULTURE WILL BE HELD FOR 5 DAYS BEFORE ISSUING A FINAL NEGATIVE REPORT Performed at [facility]    Report Status PENDING  Incomplete  Urine culture     Status: None   Collection Time: 04/16/14  9:25 PM  Result Value Ref Range Status   Specimen Description URINE, CATHETERIZED  Final   Special Requests NONE  Final   Colony Count NO GROWTH Performed at [facility]   Final   Culture NO GROWTH Performed at [facility]   Final   Report Status 04/18/2014 FINAL  Final     Studies:  Recent x-ray studies have been reviewed in detail by the Attending Physician  Time spent :  35 mins  Erin Hearing, ANP [facility] Office  [phone] Pager [phone]  On-Call/Text Page:      [email or web address]      password [email or web address]  If 7PM-7AM, please contact night-coverage [email or web address] Password [email or web address] 04/21/2014, 2:18 PM   LOS: 5 days   I have personally examined this patient and reviewed the entire database. I have reviewed the above note, made any necessary editorial changes, and agree with its content.  Cherene Altes, MD [facility]

## 2014-04-22 ENCOUNTER — Inpatient Hospital Stay (HOSPITAL_COMMUNITY): Payer: Medicare Other

## 2014-04-22 DIAGNOSIS — E119 Type 2 diabetes mellitus without complications: Secondary | ICD-10-CM | POA: Insufficient documentation

## 2014-04-22 DIAGNOSIS — I059 Rheumatic mitral valve disease, unspecified: Secondary | ICD-10-CM

## 2014-04-22 DIAGNOSIS — J810 Acute pulmonary edema: Secondary | ICD-10-CM

## 2014-04-22 DIAGNOSIS — I5031 Acute diastolic (congestive) heart failure: Secondary | ICD-10-CM | POA: Insufficient documentation

## 2014-04-22 DIAGNOSIS — Z794 Long term (current) use of insulin: Secondary | ICD-10-CM

## 2014-04-22 LAB — BASIC METABOLIC PANEL
Anion gap: 8 (ref 5–15)
BUN: 33 mg/dL — ABNORMAL HIGH (ref 6–23)
CO2: 31 mmol/L (ref 19–32)
Calcium: 8.8 mg/dL (ref 8.4–10.5)
Chloride: 98 mEq/L (ref 96–112)
Creatinine, Ser: 0.79 mg/dL (ref 0.50–1.10)
GFR, EST NON AFRICAN AMERICAN: 83 mL/min — AB (ref >90)
Glucose, Bld: 129 mg/dL — ABNORMAL HIGH (ref 70–99)
Potassium: 4.5 mmol/L (ref 3.5–5.1)
Sodium: 137 mmol/L (ref 135–145)

## 2014-04-22 LAB — GLUCOSE, CAPILLARY
GLUCOSE-CAPILLARY: 112 mg/dL — AB (ref 70–99)
Glucose-Capillary: 132 mg/dL — ABNORMAL HIGH (ref 70–99)
Glucose-Capillary: 119 mg/dL — ABNORMAL HIGH (ref 70–99)
Glucose-Capillary: 156 mg/dL — ABNORMAL HIGH (ref 70–99)

## 2014-04-22 LAB — CBC
HCT: 29.9 % — ABNORMAL LOW (ref 36.0–46.0)
HEMOGLOBIN: 9.3 g/dL — AB (ref 12.0–15.0)
MCH: 25.1 pg — AB (ref 26.0–34.0)
MCHC: 31.1 g/dL (ref 30.0–36.0)
MCV: 80.8 fL (ref 78.0–100.0)
Platelets: 100 10*3/uL — ABNORMAL LOW (ref 150–400)
RBC: 3.7 MIL/uL — ABNORMAL LOW (ref 3.87–5.11)
RDW: 14.4 % (ref 11.5–15.5)
WBC: 8 10*3/uL (ref 4.0–10.5)

## 2014-04-22 LAB — BRAIN NATRIURETIC PEPTIDE: B Natriuretic Peptide: 93.9 pg/mL (ref 0.0–100.0)

## 2014-04-22 MED ORDER — OXYCODONE HCL 5 MG PO TABS
5.0000 mg | ORAL_TABLET | ORAL | Status: DC | PRN
  Administered 2014-04-22: 10 mg via ORAL
  Administered 2014-04-22: 5 mg via ORAL
  Administered 2014-04-23 – 2014-04-29 (×21): 10 mg via ORAL
  Filled 2014-04-22 (×9): qty 2
  Filled 2014-04-22: qty 1
  Filled 2014-04-22 (×13): qty 2

## 2014-04-22 MED ORDER — PANTOPRAZOLE SODIUM 40 MG PO TBEC
40.0000 mg | DELAYED_RELEASE_TABLET | Freq: Two times a day (BID) | ORAL | Status: DC
  Administered 2014-04-23 – 2014-04-29 (×14): 40 mg via ORAL
  Filled 2014-04-22 (×14): qty 1

## 2014-04-22 MED ORDER — PREGABALIN 25 MG PO CAPS
25.0000 mg | ORAL_CAPSULE | Freq: Once | ORAL | Status: AC
  Administered 2014-04-22: 25 mg via ORAL
  Filled 2014-04-22: qty 1

## 2014-04-22 NOTE — Clinical Social Work Psychosocial (Signed)
Clinical Social Work Department BRIEF PSYCHOSOCIAL ASSESSMENT 04/22/2014  Patient:  Brooke Mccullough, Brooke Mccullough     Account Number:  [id number]     Admit date:  04/16/2014  Clinical Social Worker:  Domenica Reamer, CLINICAL SOCIAL WORKER  Date/Time:  04/22/2014 12:00 N  Referred by:  Physician  Date Referred:  04/22/2014 Referred for  SNF Placement   Other Referral:   Interview type:  Patient Other interview type:    PSYCHOSOCIAL DATA Living Status:  FACILITY Admitted from facility:  [place] Level of care:  [place] Primary support name:  Lattie Haw Russell/ Dellis Filbert Primary support relationship to patient:  CHILD, ADULT Degree of support available:   high level of support- patient also lives with husband at [place] Current Concerns  Post-Acute Placement   Other Concerns:    [place] / PLAN CSW spoke with patient concerning return to [facility].  Patient is agreeable to return.  Patient states that she has lived at [facility] for a long time now with her husband in [place] and has stayed in the SNF portion for the last 2 months because of her broken ankle.    CSW will continue to follow.   Assessment/plan status:  Psychosocial Support/Ongoing Assessment of Needs Other assessment/ plan:   Information/referral to community resources:    PATIENT'S/FAMILY'S RESPONSE TO PLAN OF CARE: Patient is agreeable to return and is hopeful for quick return from hospital.       Domenica Reamer, [place] Social Worker [phone]

## 2014-04-22 NOTE — Progress Notes (Signed)
Echocardiogram 2D Echocardiogram has been performed.  Brooke Mccullough 04/22/2014, 9:16 AM

## 2014-04-22 NOTE — Progress Notes (Signed)
[facility]  Donnalynn Wheeless AOZ:[id number] DOB: 07-Sep-1945 DOA: 04/16/2014 PCP: Tammi Sou, MD   Brief narrative: 68 year old WF PMHx hypertension, dyslipidemia, diabetes, and COPD on chronic oxygen who presented with shortness of breath, cough, and fever. Patient's husband recently diagnosed with influenza. Chest x-ray consistent with interstitial pneumonitis. Patient had fever up to 101.4 in the ER. Significant hypoxemia requiring BiPAP. Mildly troponin positive but EKG unremarkable without evidence of ischemia.  Since admission patient has had issues of persistent hypoxemia requiring BiPAP and high flow oxygen. Because of recent ankle fracture with edema there were concerns for DVT but venous duplex was negative. On 12/29 patient had respiratory decompensation and was consistent with volume overload. She has been successfully diuresed with IV Lasix. Last echocardiogram was in June 2015 which revealed only diastolic dysfunction. Given significance and volume of diuresis concern patient may have had changes so repeat echocardiogram is pending. Currently has weaned to 3 L oxygen.  HPI/Subjective: Breathing better; did not rest well less night due to ankle pain. Patient reports eagerness to get out of bed and began ambulating. No chest pain or resting dyspnea.  Assessment/Plan:   Acute on chronic hypoxic respiratory failure due to pneumonitis and COPD exacerbation +/- pulmonary edema due to acute diastolic CHF exacerbation  -Husband positive for influenza so initailly presumed patient also positive; influenza PCR was negative, but has already complete course of tamiflu - CTA neg for PE but revealed RLL and mild RUL infiltrate with adenopathy - numerous antibiotic allergies - cont zithromax  - taper steroids - has diuresed >4L since extra IV Lasix 12/29 so will begin Lasix 40 mg IV BID and reevaluate daily  Acute decompensated diastolic  dysfunction -decompensated 12/29 - PO Lasix transitioned to IV (see above) - net negative 16L since admit - weight down to 97.6kg from 101 initially - has been as low as 91kg in July of this year - cont diuresis - this seems a bit extreme for "grade 1 DD"  - repeat TTE pending to assure no new component of systolic dysfxn to suggest acute event or CAD demonstrated no change  Recent R ankle fracture with leg edema -Venous duplex negative for DVT-having increased pain so increase oxycodone for breakthrough pain from 5 mg to up to 10 mg every 4 hours when necessary-ck XR to follow wound healing;surgery was on 02/11/14 and at that dc was non weight bearing- may need to ask Dr. Berenice Primas to see and comment re advancing activity  Elevated troponin -Only mildly elevated and repeat ECG unremarkable consistent with demand ischemia - no chest pain   DM on insulin -Continue Lantus and sliding scale insulin - CBGs at increased due to combination of improving oral intake and steroids so sliding scale insulin adjusted and meal coverage was added  Thrombocytopenia/Anemia -? Etiology - appears chronic with baseline 90,000 range - anemia panel unremarkable  HTN -cont Lisinopril - hydralazine added this admission  Chronic pain - cont MS contin and Oxycodone     DVT prophylaxis: Lovenox Code Status: Full Family Communication: No family at bedside- Disposition: consider transfer to telemetry later today  Consultants: None  Procedures: Lower extremity venous duplex: Negative  12/31 echocardiogram: - Left ventricle: mild concentric hypertrophy. -LVEF= 60% to 65%.  - (grade 1diastolic dysfunction). - Left atrium: The atrium was mildly to moderately dilated.    Antibiotics: Zithromax 12/26 > Tamiflu 12/25 > 12/30    Objective: Blood pressure 182/55, pulse 65, temperature  98.6 F (37 C), temperature source Oral, resp. rate 22, height 5\' 3"  (1.6 m), weight 213 lb 3 oz (96.7 kg), SpO2 98  %.  Intake/Output Summary (Last 24 hours) at 04/22/14 1000 Last data filed at 04/22/14 1610  Gross per 24 hour  Intake      0 ml  Output   4150 ml  Net  -4150 ml   Exam: Gen: No acute respiratory distress Chest: Diffuse bilateral crackles; somewhat diminished in bases,wet sounding cough, bibasilar expiratory wheeze Lt> Rt  Cardiac: Regular rate and rhythm, S1-S2, no rubs murmurs or gallops Abdomen: Soft nontender nondistended without obvious hepatosplenomegaly, no ascites Extremities: symmetrical, no effusion or cyanosis, left ankle in immobilizing boot  Scheduled Meds:  Scheduled Meds: . aspirin EC  81 mg Oral Daily  . atorvastatin  40 mg Oral Daily  . azithromycin  500 mg Intravenous Q24H  . cholecalciferol  1,000 Units Oral Daily  . enoxaparin (LOVENOX) injection  40 mg Subcutaneous Q24H  . ferrous sulfate  325 mg Oral Q breakfast  . furosemide  40 mg Intravenous BID  . hydrALAZINE  25 mg Oral 3 times per day  . insulin aspart  0-15 Units Subcutaneous TID WC  . insulin aspart  0-5 Units Subcutaneous QHS  . insulin aspart  3 Units Subcutaneous TID WC  . insulin glargine  40 Units Subcutaneous BID  . ipratropium-albuterol  3 mL Nebulization QID  . letrozole  2.5 mg Oral Daily  . levothyroxine  100 mcg Oral QAC breakfast  . lisinopril  10 mg Oral Daily  . loratadine  10 mg Oral Daily  . magnesium oxide  400 mg Oral BID  . morphine  15 mg Oral Daily  . morphine  30 mg Oral QHS  . potassium chloride SA  20 mEq Oral Daily  . pregabalin  150 mg Oral BID  . rOPINIRole  2 mg Oral QHS   Data Reviewed: Basic Metabolic Panel:  Recent Labs Lab 04/18/14 0345 04/19/14 0430 04/20/14 0009 04/21/14 0250 04/22/14 0316  NA 139 139 137 139 137  K 4.3 4.2 4.3 3.8 4.5  CL 98 97 98 97 98  CO2 27 34* 28 34* 31  GLUCOSE 217* 165* 184* 131* 129*  BUN 28* 36* 38* 32* 33*  CREATININE 0.64 0.66 0.72 0.74 0.79  CALCIUM 9.2 9.3 9.1 9.1 8.8   Liver Function Tests:  Recent Labs Lab  04/16/14 2104  AST 19  ALT 13  ALKPHOS 82  BILITOT 0.4  PROT 6.2  ALBUMIN 3.1*   CBC:  Recent Labs Lab 04/16/14 2104 04/17/14 0640 04/18/14 0345 04/19/14 0430 04/20/14 0009 04/22/14 0316  WBC 10.2 7.4 5.9 6.1 5.4 8.0  NEUTROABS 9.0*  --   --   --   --   --   HGB 9.4* 8.9* 9.2* 9.2* 9.0* 9.3*  HCT 29.7* 28.8* 30.4* 30.4* 29.1* 29.9*  MCV 82.7 83.5 83.1 83.7 82.0 80.8  PLT 118* 105* 97* 109* 93* 100*   Cardiac Enzymes:  Recent Labs Lab 04/17/14 0140 04/17/14 0640 04/17/14 1307 04/18/14 1035 04/18/14 1721 04/18/14 2109  CKTOTAL 26  --   --   --   --   --   CKMB 1.2  --   --   --   --   --   TROPONINI 0.20* 0.18* 0.20* 0.40* 0.27* 0.24*   BNP (last 3 results)  Recent Labs  09/28/13 1304 10/07/13 1908  PROBNP 356.8* 919.7*   CBG:  Recent Labs  Lab 04/21/14 1409 04/21/14 1552 04/21/14 1804 04/21/14 2129 04/22/14 0751  GLUCAP 192* 241* 217* 222* 132*    Recent Results (from the past 240 hour(s))  Culture, blood (routine x 2)     Status: None (Preliminary result)   Collection Time: 04/16/14  8:50 PM  Result Value Ref Range Status   Specimen Description BLOOD LEFT FOREARM  Final   Special Requests BOTTLES DRAWN AEROBIC AND ANAEROBIC 5CC EACH  Final   Culture   Final           BLOOD CULTURE RECEIVED NO GROWTH TO DATE CULTURE WILL BE HELD FOR 5 DAYS BEFORE ISSUING A FINAL NEGATIVE REPORT Performed at [facility]    Report Status PENDING  Incomplete  Culture, blood (routine x 2)     Status: None (Preliminary result)   Collection Time: 04/16/14  9:11 PM  Result Value Ref Range Status   Specimen Description BLOOD LEFT HAND  Final   Special Requests BOTTLES DRAWN AEROBIC AND ANAEROBIC 5CC EACH  Final   Culture   Final           BLOOD CULTURE RECEIVED NO GROWTH TO DATE CULTURE WILL BE HELD FOR 5 DAYS BEFORE ISSUING A FINAL NEGATIVE REPORT Performed at [facility]    Report Status PENDING  Incomplete  Urine culture     Status: None    Collection Time: 04/16/14  9:25 PM  Result Value Ref Range Status   Specimen Description URINE, CATHETERIZED  Final   Special Requests NONE  Final   Colony Count NO GROWTH Performed at [facility]   Final   Culture NO GROWTH Performed at [facility]   Final   Report Status 04/18/2014 FINAL  Final  Urine culture     Status: None   Collection Time: 04/20/14 12:23 PM  Result Value Ref Range Status   Specimen Description URINE, CATHETERIZED  Final   Special Requests NONE  Final   Colony Count NO GROWTH Performed at [facility]   Final   Culture NO GROWTH Performed at [facility]   Final   Report Status 04/21/2014 FINAL  Final     Studies:  Recent x-ray studies have been reviewed in detail by the Attending Physician  Time spent :  35 mins  Erin Hearing, ANP [facility] Office  [phone] Pager [phone]  On-Call/Text Page:      [email or web address]      password [email or web address]  If 7PM-7AM, please contact night-coverage [email or web address] Password [email or web address] 04/22/2014, 10:00 AM   LOS: 6 days    Examined Patient and discussed A&P with ANP Ebony Hail and agree with above plan. I have reviewed the entire database. I have made any necessary editorial changes, and agree with its content. Pt with Multiple Complex medical problems> 40 min spent in direct Pt care  Dia Crawford, MD [facility] [phone] pager

## 2014-04-23 DIAGNOSIS — J438 Other emphysema: Secondary | ICD-10-CM

## 2014-04-23 LAB — BASIC METABOLIC PANEL
Anion gap: 13 (ref 5–15)
BUN: 32 mg/dL — ABNORMAL HIGH (ref 6–23)
CO2: 26 mmol/L (ref 19–32)
CREATININE: 0.78 mg/dL (ref 0.50–1.10)
Calcium: 8.8 mg/dL (ref 8.4–10.5)
Chloride: 100 mEq/L (ref 96–112)
GFR calc Af Amer: >90 mL/min (ref >90)
GFR calc non Af Amer: 83 mL/min — ABNORMAL LOW (ref >90)
GLUCOSE: 91 mg/dL (ref 70–99)
Potassium: 4.3 mmol/L (ref 3.5–5.1)
Sodium: 139 mmol/L (ref 135–145)

## 2014-04-23 LAB — CBC
HEMATOCRIT: 30.9 % — AB (ref 36.0–46.0)
HEMOGLOBIN: 9.5 g/dL — AB (ref 12.0–15.0)
MCH: 25.2 pg — ABNORMAL LOW (ref 26.0–34.0)
MCHC: 30.7 g/dL (ref 30.0–36.0)
MCV: 82 fL (ref 78.0–100.0)
PLATELETS: 114 10*3/uL — AB (ref 150–400)
RBC: 3.77 MIL/uL — ABNORMAL LOW (ref 3.87–5.11)
RDW: 14.8 % (ref 11.5–15.5)
WBC: 10.4 10*3/uL (ref 4.0–10.5)

## 2014-04-23 LAB — GLUCOSE, CAPILLARY
GLUCOSE-CAPILLARY: 106 mg/dL — AB (ref 70–99)
Glucose-Capillary: 80 mg/dL (ref 70–99)
Glucose-Capillary: 102 mg/dL — ABNORMAL HIGH (ref 70–99)
Glucose-Capillary: 133 mg/dL — ABNORMAL HIGH (ref 70–99)

## 2014-04-23 MED ORDER — MORPHINE SULFATE ER 15 MG PO TBCR
15.0000 mg | EXTENDED_RELEASE_TABLET | Freq: Two times a day (BID) | ORAL | Status: DC
  Administered 2014-04-23 – 2014-04-25 (×4): 15 mg via ORAL
  Filled 2014-04-23 (×4): qty 1

## 2014-04-23 NOTE — Progress Notes (Signed)
[facility] TEAM 1 - Stepdown/ICU TEAM Progress Note  Brooke Mccullough SNK:[id number] DOB: 13-Jun-1945 DOA: 04/16/2014 PCP: Tammi Sou, MD  Admit HPI / Brief Narrative: 69 year old WF PMHx hypertension, dyslipidemia, diabetes, and COPD on chronic oxygen who presented with shortness of breath, cough, and fever. Patient's husband recently diagnosed with influenza. Chest x-ray consistent with interstitial pneumonitis. Patient had fever up to 101.4 in the ER. Significant hypoxemia requiring BiPAP. Mildly troponin positive but EKG unremarkable without evidence of ischemia.  Since admission patient has had issues of persistent hypoxemia requiring BiPAP and high flow oxygen. Because of recent ankle fracture with edema there were concerns for DVT but venous duplex was negative. On 12/29 patient had respiratory decompensation and was consistent with volume overload. She has been successfully diuresed with IV Lasix. Last echocardiogram was in June 2015 which revealed only diastolic dysfunction. Given significance and volume of diuresis concern patient may have had changes so repeat echocardiogram is pending. Currently has weaned to 3 L oxygen.  HPI/Subjective: 1/1 A/O 4, work her breathing decreased, negative CP, positive fatigue.  Assessment/Plan:  Acute on chronic hypoxic respiratory failure due to pneumonitis and COPD exacerbation +/- pulmonary edema due to acute diastolic CHF exacerbation  -Husband positive for influenza so initailly presumed patient also positive; influenza PCR was negative, but has already complete course of tamiflu  - CTA neg for PE but revealed RLL and mild RUL infiltrate with adenopathy  - cont zithromax for 7 day course - Off steroids   Acute decompensated diastolic dysfunction/cardiomyopathy -decompensated 12/29  - net negative 18L since admit  -Admission weight= 101 kg,   1/1 weight =??, On 12/31 down to 97.6kg; as low as 91kg in July of this year  - Repeat  Echocardiogram; cardiomyopathy with diastolic dysfunction. See results below  Recent R ankle fracture with leg edema -Venous duplex negative for DVT -Continue MS Contin 15 mg BID -And tinea oxycodone IR 5 mg -10 mg every 4 hours PRN to pain  -surgery was on 02/11/14 and at that dc was non weight bearing- may need to reconsult Dr. Berenice Primas prior to discharge for advancing activity instructions  Elevated troponin -Only mildly elevated and repeat ECG unremarkable consistent with demand ischemia - no chest pain   Diabetes type 2 controlled  -Continue Lantus 40 units BID - Continue moderate SSI -Continue NovoLog 3 units  QAC   Thrombocytopenia/Anemia -? Etiology - appears chronic with baseline 90,000 range (at baseline)  - anemia panel unremarkable  HTN -cont Lisinopril 10 mg daily -Continue Hydralazine 25 mg  TID   Chronic pain syndrome  -See right ankle fracture     Code Status: FULL Family Communication: no family present at time of exam Disposition Plan: Resolution COPD exacerbation    Consultants: NA   Procedure/Significant Events: Lower extremity venous duplex: Negative 12/31 echocardiogram: - Left ventricle: mild concentric hypertrophy. -LVEF= 60% to 65%.  - (grade 1diastolic dysfunction). - Left atrium: The atrium was mildly to moderately dilated.   Culture 12/25 blood left forearm/Hand  NGTD 12/25 urine NGTD. 12/29 urine NGTD   Antibiotics: Zithromax 12/26 > Tamiflu 12/25 > 12/30  DVT prophylaxis: Lovenox   Devices    LINES / TUBES:      Continuous Infusions:   Objective: VITAL SIGNS: Temp: 98.9 F (37.2 C) (01/01 0808) Temp Source: Oral (01/01 0808) BP: 137/61 mmHg (01/01 0808) Pulse Rate: 68 (01/01 0808) SPO2; FIO2:   Intake/Output Summary (Last 24 hours) at 04/23/14 0958 Last data filed at 04/23/14 [phone]  Gross per 24 hour  Intake    300 ml  Output   4175 ml  Net  -3875 ml     Exam: General: A/O 4, NAD, No acute  respiratory distress Lungs: Positive bibasilar crackles/rhonchi, mild basilar wheeze most likely baseline secondary to emphysema, Cardiovascular: Regular rate and rhythm without murmur gallop or rub normal S1 and S2 Abdomen: Nontender, nondistended, soft, bowel sounds positive, no rebound, no ascites, no appreciable mass Extremities: No significant cyanosis, clubbing, left lower extremity pedal edema 1-2+ (per patient Baseline) right lower extremity in cam boot  Data Reviewed: Basic Metabolic Panel:  Recent Labs Lab 04/19/14 0430 04/20/14 0009 04/21/14 0250 04/22/14 0316 04/23/14 0308  NA 139 137 139 137 139  K 4.2 4.3 3.8 4.5 4.3  CL 97 98 97 98 100  CO2 34* 28 34* 31 26  GLUCOSE 165* 184* 131* 129* 91  BUN 36* 38* 32* 33* 32*  CREATININE 0.66 0.72 0.74 0.79 0.78  CALCIUM 9.3 9.1 9.1 8.8 8.8   Liver Function Tests:  Recent Labs Lab 04/16/14 2104  AST 19  ALT 13  ALKPHOS 82  BILITOT 0.4  PROT 6.2  ALBUMIN 3.1*   No results for input(s): LIPASE, AMYLASE in the last 168 hours. No results for input(s): AMMONIA in the last 168 hours. CBC:  Recent Labs Lab 04/16/14 2104  04/18/14 0345 04/19/14 0430 04/20/14 0009 04/22/14 0316 04/23/14 0308  WBC 10.2  < > 5.9 6.1 5.4 8.0 10.4  NEUTROABS 9.0*  --   --   --   --   --   --   HGB 9.4*  < > 9.2* 9.2* 9.0* 9.3* 9.5*  HCT 29.7*  < > 30.4* 30.4* 29.1* 29.9* 30.9*  MCV 82.7  < > 83.1 83.7 82.0 80.8 82.0  PLT 118*  < > 97* 109* 93* 100* 114*  < > = values in this interval not displayed. Cardiac Enzymes:  Recent Labs Lab 04/17/14 0140 04/17/14 0640 04/17/14 1307 04/18/14 1035 04/18/14 1721 04/18/14 2109  CKTOTAL 26  --   --   --   --   --   CKMB 1.2  --   --   --   --   --   TROPONINI 0.20* 0.18* 0.20* 0.40* 0.27* 0.24*   BNP (last 3 results)  Recent Labs  09/28/13 1304 10/07/13 1908  PROBNP 356.8* 919.7*   CBG:  Recent Labs Lab 04/22/14 0751 04/22/14 1222 04/22/14 1635 04/22/14 2153 04/23/14 0812    GLUCAP 132* 119* 112* 156* 80    Recent Results (from the past 240 hour(s))  Culture, blood (routine x 2)     Status: None (Preliminary result)   Collection Time: 04/16/14  8:50 PM  Result Value Ref Range Status   Specimen Description BLOOD LEFT FOREARM  Final   Special Requests BOTTLES DRAWN AEROBIC AND ANAEROBIC 5CC EACH  Final   Culture   Final           BLOOD CULTURE RECEIVED NO GROWTH TO DATE CULTURE WILL BE HELD FOR 5 DAYS BEFORE ISSUING A FINAL NEGATIVE REPORT Performed at [facility]    Report Status PENDING  Incomplete  Culture, blood (routine x 2)     Status: None (Preliminary result)   Collection Time: 04/16/14  9:11 PM  Result Value Ref Range Status   Specimen Description BLOOD LEFT HAND  Final   Special Requests BOTTLES DRAWN AEROBIC AND ANAEROBIC [facility] EACH  Final   Culture   Final  BLOOD CULTURE RECEIVED NO GROWTH TO DATE CULTURE WILL BE HELD FOR 5 DAYS BEFORE ISSUING A FINAL NEGATIVE REPORT Performed at [facility]    Report Status PENDING  Incomplete  Urine culture     Status: None   Collection Time: 04/16/14  9:25 PM  Result Value Ref Range Status   Specimen Description URINE, CATHETERIZED  Final   Special Requests NONE  Final   Colony Count NO GROWTH Performed at [facility]   Final   Culture NO GROWTH Performed at [facility]   Final   Report Status 04/18/2014 FINAL  Final  Urine culture     Status: None   Collection Time: 04/20/14 12:23 PM  Result Value Ref Range Status   Specimen Description URINE, CATHETERIZED  Final   Special Requests NONE  Final   Colony Count NO GROWTH Performed at [facility]   Final   Culture NO GROWTH Performed at [facility]   Final   Report Status 04/21/2014 FINAL  Final     Studies:  Recent x-ray studies have been reviewed in detail by the Attending Physician  Scheduled Meds:  Scheduled Meds: . aspirin EC  81 mg Oral Daily  . atorvastatin  40 mg  Oral Daily  . azithromycin  500 mg Intravenous Q24H  . cholecalciferol  1,000 Units Oral Daily  . enoxaparin (LOVENOX) injection  40 mg Subcutaneous Q24H  . ferrous sulfate  325 mg Oral Q breakfast  . furosemide  40 mg Intravenous BID  . hydrALAZINE  25 mg Oral 3 times per day  . insulin aspart  0-15 Units Subcutaneous TID WC  . insulin aspart  0-5 Units Subcutaneous QHS  . insulin aspart  3 Units Subcutaneous TID WC  . insulin glargine  40 Units Subcutaneous BID  . ipratropium-albuterol  3 mL Nebulization QID  . letrozole  2.5 mg Oral Daily  . levothyroxine  100 mcg Oral QAC breakfast  . lisinopril  10 mg Oral Daily  . loratadine  10 mg Oral Daily  . magnesium oxide  400 mg Oral BID  . morphine  15 mg Oral Daily  . morphine  30 mg Oral QHS  . pantoprazole  40 mg Oral BID  . potassium chloride SA  20 mEq Oral Daily  . pregabalin  150 mg Oral BID  . rOPINIRole  2 mg Oral QHS    Time spent on care of this patient: 40 mins   Allie Bossier , MD   [facility] Office  [phone] Pager - [phone]  On-Call/Text Page:      [email or web address]      password [email or web address]  If 7PM-7AM, please contact night-coverage [email or web address] Password [email or web address] 04/23/2014, 9:58 AM   LOS: 7 days

## 2014-04-23 NOTE — Progress Notes (Signed)
  NURSING PROGRESS NOTE  Brooke Mccullough [id number] Transfer Data: 04/23/2014 1:57 PM Attending Provider: Allie Bossier, MD SNK:[id number],ALPFXT H, MD Code Status: full  Allergies:  Ciprofloxacin; Latex; Clindamycin/lincomycin; Fentanyl; Indocin; Keflex; Penicillins; and Vancomycin Past Medical History:   has a past medical history of COPD (chronic obstructive pulmonary disease); Fibromyalgia; IBS (irritable bowel syndrome); Edema; Gallstones; Hernia of abdominal wall; Hypertension; Hyperlipidemia; On home oxygen therapy; History of pneumonia; Adrenal benign tumor; Type II diabetes mellitus; Anemia (2015); GERD (gastroesophageal reflux disease); History of duodenal ulcer; Daily headache; Migraine; TIA (transient ischemic attack) (2012; 2014); Degenerative disc disease; Osteoarthritis (arthritis due to wear and tear of joints); Chronic lower back pain; Gout; Anxiety; Depression; Breast cancer (2008); Tremor; Unspecified hypothyroidism (12/21/2013); Multinodular goiter (summer 2015); and Closed right trimalleolar fracture (01/2014). Past Surgical History:   has past surgical history that includes Lumbar laminectomy (0240; 1996); Ankle fracture surgery (Left, 08/2011); Total abdominal hysterectomy w/ bilateral salpingoophorectomy (1990); Breast biopsy (Right, 2008); Breast lumpectomy (Right, 2008); Cataract extraction w/ intraocular lens implant (Right, 08/2013); Cardiac catheterization (2000's X 2); bladder tack; and ORIF ankle fracture (Right, 02/11/2014). Social History:   reports that she quit smoking about 7 months ago. Her smoking use included Cigarettes. She has a 36 pack-year smoking history. She has never used smokeless tobacco. She reports that she does not drink alcohol or use illicit drugs.  Brooke Mccullough is a 69 y.o. female patient transferred from 2C18  Blood pressure 125/39, pulse 73, temperature 98.5 F (36.9 C), temperature source Oral, resp. rate 17, height 5\' 3"  (1.6 m), weight 96.7  kg (213 lb 3 oz), SpO2 99 %.  Cardiac Monitoring: Box # none in place. Cardiac monitor yields:not on tele.  IV Fluids:  IV in place, occlusive dsg intact without redness, IV cath upper arm left, condition patent and no redness normal saline.   Skin: skin \  Will continue to evaluate and treat per MD orders.   Joslyn Hy, MSN, RN, [employer]

## 2014-04-24 ENCOUNTER — Inpatient Hospital Stay (HOSPITAL_COMMUNITY): Payer: Medicare Other

## 2014-04-24 LAB — MAGNESIUM: Magnesium: 2 mg/dL (ref 1.5–2.5)

## 2014-04-24 LAB — COMPREHENSIVE METABOLIC PANEL
ALT: 45 U/L — AB (ref 0–35)
AST: 20 U/L (ref 0–37)
Albumin: 2.9 g/dL — ABNORMAL LOW (ref 3.5–5.2)
Alkaline Phosphatase: 76 U/L (ref 39–117)
Anion gap: 7 (ref 5–15)
BUN: 33 mg/dL — ABNORMAL HIGH (ref 6–23)
CO2: 31 mmol/L (ref 19–32)
CREATININE: 0.89 mg/dL (ref 0.50–1.10)
Calcium: 8.6 mg/dL (ref 8.4–10.5)
Chloride: 99 mEq/L (ref 96–112)
GFR calc non Af Amer: 65 mL/min — ABNORMAL LOW (ref >90)
GFR, EST AFRICAN AMERICAN: 75 mL/min — AB (ref >90)
Glucose, Bld: 89 mg/dL (ref 70–99)
Potassium: 4.4 mmol/L (ref 3.5–5.1)
Sodium: 137 mmol/L (ref 135–145)
TOTAL PROTEIN: 5.6 g/dL — AB (ref 6.0–8.3)
Total Bilirubin: 0.6 mg/dL (ref 0.3–1.2)

## 2014-04-24 LAB — CBC WITH DIFFERENTIAL/PLATELET
BASOS ABS: 0 10*3/uL (ref 0.0–0.1)
BASOS PCT: 0 % (ref 0–1)
EOS PCT: 3 % (ref 0–5)
Eosinophils Absolute: 0.4 10*3/uL (ref 0.0–0.7)
HEMATOCRIT: 32 % — AB (ref 36.0–46.0)
HEMOGLOBIN: 10 g/dL — AB (ref 12.0–15.0)
LYMPHS PCT: 27 % (ref 12–46)
Lymphs Abs: 3.2 10*3/uL (ref 0.7–4.0)
MCH: 26.3 pg (ref 26.0–34.0)
MCHC: 31.3 g/dL (ref 30.0–36.0)
MCV: 84.2 fL (ref 78.0–100.0)
MONO ABS: 0.7 10*3/uL (ref 0.1–1.0)
MONOS PCT: 6 % (ref 3–12)
NEUTROS ABS: 7.7 10*3/uL (ref 1.7–7.7)
NEUTROS PCT: 64 % (ref 43–77)
Platelets: 140 10*3/uL — ABNORMAL LOW (ref 150–400)
RBC: 3.8 MIL/uL — ABNORMAL LOW (ref 3.87–5.11)
RDW: 15.3 % (ref 11.5–15.5)
WBC: 12 10*3/uL — ABNORMAL HIGH (ref 4.0–10.5)

## 2014-04-24 LAB — LIPID PANEL
CHOLESTEROL: 144 mg/dL (ref 0–200)
HDL: 33 mg/dL — ABNORMAL LOW (ref >39)
LDL CALC: 68 mg/dL (ref 0–99)
TRIGLYCERIDES: 213 mg/dL — AB (ref <150)
Total CHOL/HDL Ratio: 4.4 RATIO
VLDL: 43 mg/dL — ABNORMAL HIGH (ref 0–40)

## 2014-04-24 LAB — GLUCOSE, CAPILLARY
Glucose-Capillary: 88 mg/dL (ref 70–99)
Glucose-Capillary: 121 mg/dL — ABNORMAL HIGH (ref 70–99)
Glucose-Capillary: 113 mg/dL — ABNORMAL HIGH (ref 70–99)
Glucose-Capillary: 187 mg/dL — ABNORMAL HIGH (ref 70–99)

## 2014-04-24 MED ORDER — FUROSEMIDE 10 MG/ML IJ SOLN
20.0000 mg | Freq: Two times a day (BID) | INTRAMUSCULAR | Status: DC
  Administered 2014-04-24 – 2014-04-25 (×2): 20 mg via INTRAVENOUS
  Filled 2014-04-24 (×2): qty 2

## 2014-04-24 NOTE — Progress Notes (Signed)
[facility] TEAM 1 - Stepdown/ICU TEAM Progress Note  Brooke Mccullough MAU:[id number] DOB: 07-21-1945 DOA: 04/16/2014 PCP: Tammi Sou, MD  Admit HPI / Brief Narrative: 69 year old WF PMHx hypertension, dyslipidemia, diabetes, and COPD on chronic oxygen who presented with shortness of breath, cough, and fever. Patient's husband recently diagnosed with influenza. Chest x-ray consistent with interstitial pneumonitis. Patient had fever up to 101.4 in the ER. Significant hypoxemia requiring BiPAP. Mildly troponin positive but EKG unremarkable without evidence of ischemia.  Since admission patient has had issues of persistent hypoxemia requiring BiPAP and high flow oxygen. Because of recent ankle fracture with edema there were concerns for DVT but venous duplex was negative. On 12/29 patient had respiratory decompensation and was consistent with volume overload. She has been successfully diuresed with IV Lasix. Last echocardiogram was in June 2015 which revealed only diastolic dysfunction. Given significance and volume of diuresis concern patient may have had changes so repeat echocardiogram is pending. Currently has weaned to 3 L oxygen.  HPI/Subjective: She is feeling better. Still coughing a lot.  Had BM yesterday. No others complaints.   Assessment/Plan:  Acute on chronic hypoxic respiratory failure due to pneumonitis and COPD exacerbation +/- pulmonary edema due to acute diastolic CHF exacerbation  -Husband positive for influenza so initailly presumed patient also positive; influenza PCR was negative, but has already complete course of tamiflu  - CTA neg for PE but revealed RLL and mild RUL infiltrate with adenopathy  - cont zithromax for 7 day course - Off steroids  -follow wbc trend.   Acute decompensated diastolic dysfunction/cardiomyopathy -decompensated 12/29  - net negative 22 L since admit  -Admission weight = 101 kg,   On 12/31 down to 97.6kg; as low as 91kg in July of this  year  -weight today 92/  - Repeat Echocardiogram; cardiomyopathy with diastolic dysfunction. See results below -Repeat chest x ray. Will change lasix to 20 mg IV BID. BUN increasing.   Recent R ankle fracture with leg edema -Venous duplex negative for DVT -Continue MS Contin 15 mg BID -And oxycodone IR 5 mg -10 mg every 4 hours PRN to pain  -surgery was on 02/11/14 and at that dc was non weight bearing- may need to reconsult Dr. Berenice Primas prior to discharge for advancing activity instructions  Elevated troponin -Only mildly elevated and repeat ECG unremarkable consistent with demand ischemia - no chest pain   Diabetes type 2 controlled  -Continue Lantus 40 units BID - Continue moderate SSI -Continue NovoLog 3 units  QAC   Thrombocytopenia/Anemia -? Etiology - appears chronic with baseline 90,000 range (at baseline)  - anemia panel unremarkable -improving.  HTN -cont Lisinopril 10 mg daily -Continue Hydralazine 25 mg  TID   Chronic pain syndrome  -See right ankle fracture     Code Status: FULL Family Communication: no family present at time of exam Disposition Plan: Resolution COPD exacerbation    Consultants: NA   Procedure/Significant Events: Lower extremity venous duplex: Negative 12/31 echocardiogram: - Left ventricle: mild concentric hypertrophy. -LVEF= 60% to 65%.  - (grade 1diastolic dysfunction). - Left atrium: The atrium was mildly to moderately dilated.   Culture 12/25 blood left forearm/Hand  NGTD 12/25 urine NGTD. 12/29 urine NGTD   Antibiotics: Zithromax 12/26 > Tamiflu 12/25 > 12/30  DVT prophylaxis: Lovenox   Devices    LINES / TUBES:      Continuous Infusions:   Objective: VITAL SIGNS: Temp: 99 F (37.2 C) (01/02 0620) Temp Source: Oral (01/02 0620) BP: 130/47  mmHg (01/02 0620) Pulse Rate: 73 (01/02 0620) SPO2; FIO2:   Intake/Output Summary (Last 24 hours) at 04/24/14 1353 Last data filed at 04/24/14 1128  Gross per  24 hour  Intake   1080 ml  Output   3700 ml  Net  -2620 ml     Exam: General: A/O 4, NAD, No acute respiratory distress Lungs: Positive bibasilar crackles/rhonchi, mild basilar wheeze most likely baseline secondary to emphysema, Cardiovascular: Regular rate and rhythm without murmur gallop or rub normal S1 and S2 Abdomen: Nontender, nondistended, soft, bowel sounds positive, no rebound. Extremities: No significant cyanosis, clubbing, left lower extremity pedal edema 1-2+ (per patient Baseline) right lower extremity in cam boot  Data Reviewed: Basic Metabolic Panel:  Recent Labs Lab 04/20/14 0009 04/21/14 0250 04/22/14 0316 04/23/14 0308 04/24/14 0355  NA 137 139 137 139 137  K 4.3 3.8 4.5 4.3 4.4  CL 98 97 98 100 99  CO2 28 34* 31 26 31   GLUCOSE 184* 131* 129* 91 89  BUN 38* 32* 33* 32* 33*  CREATININE 0.72 0.74 0.79 0.78 0.89  CALCIUM 9.1 9.1 8.8 8.8 8.6  MG  --   --   --   --  2.0   Liver Function Tests:  Recent Labs Lab 04/24/14 0355  AST 20  ALT 45*  ALKPHOS 76  BILITOT 0.6  PROT 5.6*  ALBUMIN 2.9*   No results for input(s): LIPASE, AMYLASE in the last 168 hours. No results for input(s): AMMONIA in the last 168 hours. CBC:  Recent Labs Lab 04/19/14 0430 04/20/14 0009 04/22/14 0316 04/23/14 0308 04/24/14 0355  WBC 6.1 5.4 8.0 10.4 12.0*  NEUTROABS  --   --   --   --  7.7  HGB 9.2* 9.0* 9.3* 9.5* 10.0*  HCT 30.4* 29.1* 29.9* 30.9* 32.0*  MCV 83.7 82.0 80.8 82.0 84.2  PLT 109* 93* 100* 114* 140*   Cardiac Enzymes:  Recent Labs Lab 04/18/14 1035 04/18/14 1721 04/18/14 2109  TROPONINI 0.40* 0.27* 0.24*   BNP (last 3 results)  Recent Labs  09/28/13 1304 10/07/13 1908  PROBNP 356.8* 919.7*   CBG:  Recent Labs Lab 04/23/14 1153 04/23/14 1627 04/23/14 2145 04/24/14 0825 04/24/14 1123  GLUCAP 102* 106* 133* 88 121*    Recent Results (from the past 240 hour(s))  Culture, blood (routine x 2)     Status: None (Preliminary result)     Collection Time: 04/16/14  8:50 PM  Result Value Ref Range Status   Specimen Description BLOOD LEFT FOREARM  Final   Special Requests BOTTLES DRAWN AEROBIC AND ANAEROBIC 5CC EACH  Final   Culture   Final           BLOOD CULTURE RECEIVED NO GROWTH TO DATE CULTURE WILL BE HELD FOR 5 DAYS BEFORE ISSUING A FINAL NEGATIVE REPORT Performed at [facility]    Report Status PENDING  Incomplete  Culture, blood (routine x 2)     Status: None (Preliminary result)   Collection Time: 04/16/14  9:11 PM  Result Value Ref Range Status   Specimen Description BLOOD LEFT HAND  Final   Special Requests BOTTLES DRAWN AEROBIC AND ANAEROBIC 5CC EACH  Final   Culture   Final           BLOOD CULTURE RECEIVED NO GROWTH TO DATE CULTURE WILL BE HELD FOR 5 DAYS BEFORE ISSUING A FINAL NEGATIVE REPORT Performed at [facility]    Report Status PENDING  Incomplete  Urine culture  Status: None   Collection Time: 04/16/14  9:25 PM  Result Value Ref Range Status   Specimen Description URINE, CATHETERIZED  Final   Special Requests NONE  Final   Colony Count NO GROWTH Performed at [facility]   Final   Culture NO GROWTH Performed at [facility]   Final   Report Status 04/18/2014 FINAL  Final  Urine culture     Status: None   Collection Time: 04/20/14 12:23 PM  Result Value Ref Range Status   Specimen Description URINE, CATHETERIZED  Final   Special Requests NONE  Final   Colony Count NO GROWTH Performed at [facility]   Final   Culture NO GROWTH Performed at [facility]   Final   Report Status 04/21/2014 FINAL  Final     Studies:  Recent x-ray studies have been reviewed in detail by the Attending Physician  Scheduled Meds:  Scheduled Meds: . aspirin EC  81 mg Oral Daily  . atorvastatin  40 mg Oral Daily  . azithromycin  500 mg Intravenous Q24H  . cholecalciferol  1,000 Units Oral Daily  . enoxaparin (LOVENOX) injection  40 mg Subcutaneous  Q24H  . ferrous sulfate  325 mg Oral Q breakfast  . furosemide  20 mg Intravenous BID  . hydrALAZINE  25 mg Oral 3 times per day  . insulin aspart  0-15 Units Subcutaneous TID WC  . insulin aspart  0-5 Units Subcutaneous QHS  . insulin aspart  3 Units Subcutaneous TID WC  . insulin glargine  40 Units Subcutaneous BID  . ipratropium-albuterol  3 mL Nebulization QID  . letrozole  2.5 mg Oral Daily  . levothyroxine  100 mcg Oral QAC breakfast  . lisinopril  10 mg Oral Daily  . loratadine  10 mg Oral Daily  . magnesium oxide  400 mg Oral BID  . morphine  15 mg Oral Q12H  . pantoprazole  40 mg Oral BID  . potassium chloride SA  20 mEq Oral Daily  . pregabalin  150 mg Oral BID  . rOPINIRole  2 mg Oral QHS    Time spent on care of this patient: 25 mins   Milan Perkins, Cassie Freer , MD   [facility] Pager [phone]   If 7PM-7AM, please contact night-coverage [email or web address] Password [email or web address] 04/24/2014, 1:53 PM   LOS: 8 days

## 2014-04-24 NOTE — Plan of Care (Signed)
Problem: Phase III Progression Outcomes Goal: O2 sats > or equal to 93% on room air Outcome: Not Met (add Reason) Pt home dependent on oxygen at home.

## 2014-04-25 LAB — BASIC METABOLIC PANEL
ANION GAP: 5 (ref 5–15)
BUN: 37 mg/dL — ABNORMAL HIGH (ref 6–23)
CO2: 30 mmol/L (ref 19–32)
Calcium: 8.7 mg/dL (ref 8.4–10.5)
Chloride: 100 mEq/L (ref 96–112)
Creatinine, Ser: 0.96 mg/dL (ref 0.50–1.10)
GFR calc Af Amer: 68 mL/min — ABNORMAL LOW (ref >90)
GFR, EST NON AFRICAN AMERICAN: 59 mL/min — AB (ref >90)
GLUCOSE: 138 mg/dL — AB (ref 70–99)
Potassium: 4.6 mmol/L (ref 3.5–5.1)
Sodium: 135 mmol/L (ref 135–145)

## 2014-04-25 LAB — GLUCOSE, CAPILLARY
Glucose-Capillary: 143 mg/dL — ABNORMAL HIGH (ref 70–99)
Glucose-Capillary: 212 mg/dL — ABNORMAL HIGH (ref 70–99)
Glucose-Capillary: 78 mg/dL (ref 70–99)
Glucose-Capillary: 163 mg/dL — ABNORMAL HIGH (ref 70–99)

## 2014-04-25 LAB — HEMOGLOBIN A1C
Hgb A1c MFr Bld: 5.9 % — ABNORMAL HIGH (ref <5.7)
MEAN PLASMA GLUCOSE: 123 mg/dL — AB (ref <117)

## 2014-04-25 LAB — CBC
HCT: 31.2 % — ABNORMAL LOW (ref 36.0–46.0)
Hemoglobin: 9.6 g/dL — ABNORMAL LOW (ref 12.0–15.0)
MCH: 25.5 pg — ABNORMAL LOW (ref 26.0–34.0)
MCHC: 30.8 g/dL (ref 30.0–36.0)
MCV: 83 fL (ref 78.0–100.0)
Platelets: 132 10*3/uL — ABNORMAL LOW (ref 150–400)
RBC: 3.76 MIL/uL — ABNORMAL LOW (ref 3.87–5.11)
RDW: 15.3 % (ref 11.5–15.5)
WBC: 10.9 10*3/uL — ABNORMAL HIGH (ref 4.0–10.5)

## 2014-04-25 LAB — CULTURE, BLOOD (ROUTINE X 2)
CULTURE: NO GROWTH
Culture: NO GROWTH

## 2014-04-25 MED ORDER — ALBUTEROL SULFATE (2.5 MG/3ML) 0.083% IN NEBU
2.5000 mg | INHALATION_SOLUTION | Freq: Four times a day (QID) | RESPIRATORY_TRACT | Status: DC | PRN
  Administered 2014-04-25: 2.5 mg via RESPIRATORY_TRACT
  Filled 2014-04-25: qty 3

## 2014-04-25 MED ORDER — MORPHINE SULFATE ER 15 MG PO TBCR
15.0000 mg | EXTENDED_RELEASE_TABLET | Freq: Every morning | ORAL | Status: DC
  Administered 2014-04-26 – 2014-04-29 (×4): 15 mg via ORAL
  Filled 2014-04-25 (×4): qty 1

## 2014-04-25 MED ORDER — MORPHINE SULFATE ER 30 MG PO TBCR
30.0000 mg | EXTENDED_RELEASE_TABLET | Freq: Every day | ORAL | Status: DC
  Administered 2014-04-25 – 2014-04-28 (×4): 30 mg via ORAL
  Filled 2014-04-25 (×4): qty 1

## 2014-04-25 MED ORDER — IPRATROPIUM-ALBUTEROL 0.5-2.5 (3) MG/3ML IN SOLN
3.0000 mL | Freq: Two times a day (BID) | RESPIRATORY_TRACT | Status: DC
  Administered 2014-04-25 – 2014-04-29 (×8): 3 mL via RESPIRATORY_TRACT
  Filled 2014-04-25 (×8): qty 3

## 2014-04-25 MED ORDER — FUROSEMIDE 40 MG PO TABS
40.0000 mg | ORAL_TABLET | Freq: Every day | ORAL | Status: DC
  Administered 2014-04-25: 40 mg via ORAL
  Filled 2014-04-25: qty 1

## 2014-04-25 MED ORDER — FUROSEMIDE 40 MG PO TABS
40.0000 mg | ORAL_TABLET | Freq: Two times a day (BID) | ORAL | Status: DC
  Administered 2014-04-25 – 2014-04-27 (×4): 40 mg via ORAL
  Filled 2014-04-25 (×6): qty 1

## 2014-04-25 NOTE — Progress Notes (Signed)
[facility] [place] [facility] TEAM 1 - Stepdown/ICU TEAM Progress Note  Brooke Mccullough IRJ:[id number] DOB: [birth year] DOA: 04/16/2014 PCP: Tammi Sou, MD  Admit HPI / Brief Narrative: 69 year old WF PMHx hypertension, dyslipidemia, diabetes, and COPD on chronic oxygen who presented with shortness of breath, cough, and fever. Patient's husband recently diagnosed with influenza. Chest x-ray consistent with interstitial pneumonitis. Patient had fever up to 101.4 in the ER. Significant hypoxemia requiring BiPAP. Mildly troponin positive but EKG unremarkable without evidence of ischemia.  Since admission patient has had issues of persistent hypoxemia requiring BiPAP and high flow oxygen. Because of recent ankle fracture with edema there were concerns for DVT but venous duplex was negative. On 12/29 patient had respiratory decompensation and was consistent with volume overload. She has been successfully diuresed with IV Lasix. Last echocardiogram was in June 2015 which revealed only diastolic dysfunction. Given significance and volume of diuresis concern patient may have had changes so repeat echocardiogram is pending. Currently has weaned to 3 L oxygen.  HPI/Subjective: She is feeling better. Still coughing a lot.   Assessment/Plan:  Acute on chronic hypoxic respiratory failure due to pneumonitis and COPD exacerbation +/- pulmonary edema due to acute diastolic CHF exacerbation  -Husband positive for influenza so initailly presumed patient also positive; influenza PCR was negative, but has already complete course of tamiflu  - CTA neg for PE but revealed RLL and mild RUL infiltrate with adenopathy  - on  zithromax day 8.  - Off steroids  -WBC trending down.  -Chest x ray with cardiomegaly, asymmetric infiltration.   Acute decompensated diastolic dysfunction/cardiomyopathy -decompensated 12/29  - net negative 23 L since admit  -Admission weight = 101 kg,   On 12/31 down to 97.6kg; as low as 91kg in July  of this year  -weight today 92/ --93 - Repeat Echocardiogram; cardiomyopathy with diastolic dysfunction. See results below -Repeated chest x ray with cardiomegaly, no overt edema.  -Change lasix to 40 mg BID. Continue to monitor renal function.   Recent R ankle fracture with leg edema -Venous duplex negative for DVT -Continue MS Contin 15 mg BID -And oxycodone IR 5 mg -10 mg every 4 hours PRN to pain  -surgery was on 02/11/14 and at that dc was non weight bearing- may need to reconsult Dr. Berenice Primas prior to discharge for advancing activity instructions  Elevated troponin -Only mildly elevated and repeat ECG unremarkable consistent with demand ischemia - no chest pain   Diabetes type 2 controlled  -Continue Lantus 40 units BID - Continue moderate SSI -Continue NovoLog 3 units  QAC   Thrombocytopenia/Anemia -? Etiology - appears chronic with baseline 90,000 range (at baseline)  - anemia panel unremarkable -improving.  HTN -cont Lisinopril 10 mg daily -Continue Hydralazine 25 mg  TID   Chronic pain syndrome  -See right ankle fracture  -she wants her MS back to home dose. 15 mg in am and 30 at bedtime.     Code Status: FULL Family Communication: no family present at time of exam Disposition Plan: SNF in 1 or 2 days.     Consultants: NA   Procedure/Significant Events: Lower extremity venous duplex: Negative 12/31 echocardiogram: - Left ventricle: mild concentric hypertrophy. -LVEF= 60% to 65%.  - (grade 1diastolic dysfunction). - Left atrium: The atrium was mildly to moderately dilated.   Culture 12/25 blood left forearm/Hand  NGTD 12/25 urine NGTD. 12/29 urine NGTD   Antibiotics: Zithromax 12/26 > Tamiflu 12/25 > 12/30  DVT prophylaxis: Lovenox   Devices  LINES / TUBES:      Continuous Infusions:   Objective: VITAL SIGNS: Temp: 97.7 F (36.5 C) (01/03 1421) Temp Source: Oral (01/03 1421) BP: 134/89 mmHg (01/03 1421) Pulse Rate: 73 (01/03  1421) SPO2; FIO2:   Intake/Output Summary (Last 24 hours) at 04/25/14 1613 Last data filed at 04/25/14 1541  Gross per 24 hour  Intake    360 ml  Output   2150 ml  Net  -1790 ml     Exam: General: A/O 4, NAD, No acute respiratory distress Lungs: Positive bibasilar crackles/rhonchi, mild basilar wheeze most likely baseline secondary to emphysema, Cardiovascular: Regular rate and rhythm without murmur gallop or rub normal S1 and S2 Abdomen: Nontender, nondistended, soft, bowel sounds positive, no rebound. Extremities: No significant cyanosis, clubbing, left lower extremity pedal edema 1-2+ (per patient Baseline) right lower extremity in cam boot  Data Reviewed: Basic Metabolic Panel:  Recent Labs Lab 04/21/14 0250 04/22/14 0316 04/23/14 0308 04/24/14 0355 04/25/14 0543  NA 139 137 139 137 135  K 3.8 4.5 4.3 4.4 4.6  CL 97 98 100 99 100  CO2 34* 31 26 31 30   GLUCOSE 131* 129* 91 89 138*  BUN 32* 33* 32* 33* 37*  CREATININE 0.74 0.79 0.78 0.89 0.96  CALCIUM 9.1 8.8 8.8 8.6 8.7  MG  --   --   --  2.0  --    Liver Function Tests:  Recent Labs Lab 04/24/14 0355  AST 20  ALT 45*  ALKPHOS 76  BILITOT 0.6  PROT 5.6*  ALBUMIN 2.9*   No results for input(s): LIPASE, AMYLASE in the last 168 hours. No results for input(s): AMMONIA in the last 168 hours. CBC:  Recent Labs Lab 04/20/14 0009 04/22/14 0316 04/23/14 0308 04/24/14 0355 04/25/14 0543  WBC 5.4 8.0 10.4 12.0* 10.9*  NEUTROABS  --   --   --  7.7  --   HGB 9.0* 9.3* 9.5* 10.0* 9.6*  HCT 29.1* 29.9* 30.9* 32.0* 31.2*  MCV 82.0 80.8 82.0 84.2 83.0  PLT 93* 100* 114* 140* 132*   Cardiac Enzymes:  Recent Labs Lab 04/18/14 1721 04/18/14 2109  TROPONINI 0.27* 0.24*   BNP (last 3 results)  Recent Labs  09/28/13 1304 10/07/13 1908  PROBNP 356.8* 919.7*   CBG:  Recent Labs Lab 04/24/14 1123 04/24/14 1655 04/24/14 2220 04/25/14 0802 04/25/14 1225  GLUCAP 121* 113* 187* 143* 212*     Recent Results (from the past 240 hour(s))  Culture, blood (routine x 2)     Status: None   Collection Time: 04/16/14  8:50 PM  Result Value Ref Range Status   Specimen Description BLOOD LEFT FOREARM  Final   Special Requests BOTTLES DRAWN AEROBIC AND ANAEROBIC 5CC EACH  Final   Culture   Final    NO GROWTH 5 DAYS Performed at [facility]    Report Status 04/25/2014 FINAL  Final  Culture, blood (routine x 2)     Status: None   Collection Time: 04/16/14  9:11 PM  Result Value Ref Range Status   Specimen Description BLOOD LEFT HAND  Final   Special Requests BOTTLES DRAWN AEROBIC AND ANAEROBIC 5CC EACH  Final   Culture   Final    NO GROWTH 5 DAYS Performed at [facility]    Report Status 04/25/2014 FINAL  Final  Urine culture     Status: None   Collection Time: 04/16/14  9:25 PM  Result Value Ref Range Status  Specimen Description URINE, CATHETERIZED  Final   Special Requests NONE  Final   Colony Count NO GROWTH Performed at [facility]   Final   Culture NO GROWTH Performed at [facility]   Final   Report Status 04/18/2014 FINAL  Final  Urine culture     Status: None   Collection Time: 04/20/14 12:23 PM  Result Value Ref Range Status   Specimen Description URINE, CATHETERIZED  Final   Special Requests NONE  Final   Colony Count NO GROWTH Performed at [facility]   Final   Culture NO GROWTH Performed at [facility]   Final   Report Status 04/21/2014 FINAL  Final     Studies:  Recent x-ray studies have been reviewed in detail by the Attending Physician  Scheduled Meds:  Scheduled Meds: . aspirin EC  81 mg Oral Daily  . atorvastatin  40 mg Oral Daily  . azithromycin  500 mg Intravenous Q24H  . cholecalciferol  1,000 Units Oral Daily  . enoxaparin (LOVENOX) injection  40 mg Subcutaneous Q24H  . ferrous sulfate  325 mg Oral Q breakfast  . furosemide  40 mg Oral Daily  . hydrALAZINE  25 mg Oral 3 times  per day  . insulin aspart  0-15 Units Subcutaneous TID WC  . insulin aspart  0-5 Units Subcutaneous QHS  . insulin aspart  3 Units Subcutaneous TID WC  . insulin glargine  40 Units Subcutaneous BID  . ipratropium-albuterol  3 mL Nebulization BID  . letrozole  2.5 mg Oral Daily  . levothyroxine  100 mcg Oral QAC breakfast  . lisinopril  10 mg Oral Daily  . loratadine  10 mg Oral Daily  . magnesium oxide  400 mg Oral BID  . morphine  15 mg Oral Q12H  . pantoprazole  40 mg Oral BID  . potassium chloride SA  20 mEq Oral Daily  . pregabalin  150 mg Oral BID  . rOPINIRole  2 mg Oral QHS    Time spent on care of this patient: 25 mins   Jayna Mulnix, Cassie Freer , MD   [facility] Pager [phone]   If 7PM-7AM, please contact night-coverage [email or web address] Password [email or web address] 04/25/2014, 4:13 PM   LOS: 9 days

## 2014-04-26 LAB — BASIC METABOLIC PANEL
Anion gap: 3 — ABNORMAL LOW (ref 5–15)
BUN: 42 mg/dL — ABNORMAL HIGH (ref 6–23)
CALCIUM: 8.7 mg/dL (ref 8.4–10.5)
CO2: 31 mmol/L (ref 19–32)
Chloride: 102 mEq/L (ref 96–112)
Creatinine, Ser: 1.1 mg/dL (ref 0.50–1.10)
GFR, EST AFRICAN AMERICAN: 58 mL/min — AB (ref >90)
GFR, EST NON AFRICAN AMERICAN: 50 mL/min — AB (ref >90)
Glucose, Bld: 141 mg/dL — ABNORMAL HIGH (ref 70–99)
POTASSIUM: 4.8 mmol/L (ref 3.5–5.1)
SODIUM: 136 mmol/L (ref 135–145)

## 2014-04-26 LAB — GLUCOSE, CAPILLARY
GLUCOSE-CAPILLARY: 238 mg/dL — AB (ref 70–99)
GLUCOSE-CAPILLARY: 89 mg/dL (ref 70–99)
Glucose-Capillary: 133 mg/dL — ABNORMAL HIGH (ref 70–99)
Glucose-Capillary: 146 mg/dL — ABNORMAL HIGH (ref 70–99)

## 2014-04-26 LAB — CBC
HCT: 29.5 % — ABNORMAL LOW (ref 36.0–46.0)
Hemoglobin: 9 g/dL — ABNORMAL LOW (ref 12.0–15.0)
MCH: 25.6 pg — AB (ref 26.0–34.0)
MCHC: 30.5 g/dL (ref 30.0–36.0)
MCV: 83.8 fL (ref 78.0–100.0)
Platelets: 133 10*3/uL — ABNORMAL LOW (ref 150–400)
RBC: 3.52 MIL/uL — ABNORMAL LOW (ref 3.87–5.11)
RDW: 15.3 % (ref 11.5–15.5)
WBC: 9.2 10*3/uL (ref 4.0–10.5)

## 2014-04-26 MED ORDER — POLYETHYLENE GLYCOL 3350 17 G PO PACK
17.0000 g | PACK | Freq: Every day | ORAL | Status: DC
  Administered 2014-04-26 – 2014-04-29 (×3): 17 g via ORAL
  Filled 2014-04-26 (×4): qty 1

## 2014-04-26 MED ORDER — INSULIN ASPART 100 UNIT/ML ~~LOC~~ SOLN
5.0000 [IU] | Freq: Three times a day (TID) | SUBCUTANEOUS | Status: DC
  Administered 2014-04-26 – 2014-04-29 (×9): 5 [IU] via SUBCUTANEOUS

## 2014-04-26 NOTE — Progress Notes (Signed)
Brooke Mccullough WUJ:[id number] DOB: 05-Nov-1945 DOA: 04/16/2014   PCP: Tammi Sou, MD  Brief history of present illness 69 year old WF PMHx hypertension, dyslipidemia, diabetes, and COPD on chronic oxygen who presented with shortness of breath, cough, and fever. Patient's husband recently diagnosed with influenza. Chest x-ray consistent with interstitial pneumonitis. Patient had fever up to 101.4 in the ER. Significant hypoxemia requiring BiPAP. Mildly troponin positive but EKG unremarkable without evidence of ischemia.  Since admission patient has had issues of persistent hypoxemia requiring BiPAP and high flow oxygen. Because of recent ankle fracture with edema there were concerns for DVT but venous duplex was negative. On 12/29 patient had respiratory decompensation and was consistent with volume overload. She has been successfully diuresed with IV Lasix. Last echocardiogram was in June 2015 which revealed only diastolic dysfunction. Given significance and volume of diuresis concern patient may have had changes so repeat echocardiogram is pending. Currently has weaned to 3 L oxygen.   Assessment/Plan:  Acute on chronic hypoxic respiratory failure due to pneumonitis and COPD exacerbation +/- pulmonary edema due to acute diastolic CHF exacerbation  -Husband positive for influenza so initailly presumed patient also positive; influenza PCR was negative, but has already complete course of tamiflu. CTA neg for PE but revealed RLL and mild RUL infiltrate with adenopathy  -Currently on Zithromax, off steroids, antitussives, duo nebs, slowly improving.   Acute decompensated diastolic dysfunction/cardiomyopathy -Continue Lasix, net negative balance of 24.2 L, admission weight 223 lbs down to 208lbs - 2-D echo on 12/31 showed EF of 95-62%, grade 1 diastolic dysfunction.  Recent R ankle fracture with leg edema -Venous duplex negative for DVT -Continue MS Contin 15 mg BID -And oxycodone IR  5 mg -10 mg every 4 hours PRN to pain  -surgery was on 02/11/14 and at that dc was non weight bearing, patient has appointment with Dr. Berenice Primas on 05/04/14  Elevated troponin -Only mildly elevated and repeat ECG unremarkable consistent with demand ischemia - no chest pain   Diabetes type 2 : uncontrolled -Continue Lantus 40 units BID, increase NovoLog to 5 units  QAC, continue moderate sliding scale   Thrombocytopenia/Anemia - appears chronic with baseline 90,000 range (at baseline), currently improved  - anemia panel unremarkable  HTN -cont Lisinopril 10 mg daily -Continue Hydralazine 25 mg  TID   Chronic pain syndrome with right ankle fracture,  - Continue pain control  Consultants: NA   Procedures Lower extremity venous duplex: Negative 12/31 echocardiogram: - Left ventricle: mild concentric hypertrophy. -LVEF= 60% to 65%.  - (grade 1diastolic dysfunction). - Left atrium: The atrium was mildly to moderately dilated.   Culture 12/25 blood negative so far 12/25 urine NGTD. 12/29 urine NGTD   Antibiotics: Zithromax 12/26 > Tamiflu 12/25 > 12/30  Subjective  Feeling whole lot better today, still coughing, bringing up phlegm, no fevers or chills, overall improving  Objective: VITAL SIGNS: BP 123/43 mmHg  Pulse 72  Temp(Src) 98.5 F (36.9 C) (Oral)  Resp 20  Ht 5\' 3"  (1.6 m)  Wt 94.394 kg (208 lb 1.6 oz)  BMI 36.87 kg/m2  SpO2 92%  Intake/Output Summary (Last 24 hours) at 04/26/14 1308 Last data filed at 04/26/14 1004  Gross per 24 hour  Intake    598 ml  Output   2350 ml  Net  -1752 ml     Exam: General: A/O 4, NAD, No acute respiratory distress Lungs: Bibasilar rhonchi CVS: Regular rate and rhythm without murmur gallop or rub normal S1 and S2  Abdomen: Nontender, nondistended, soft, bowel sounds positive, no rebound. Extremities: No significant cyanosis, clubbing, left lower extremity pedal edema 1-2+ (per patient Baseline) right lower extremity in  cam boot  Data Reviewed: Basic Metabolic Panel:  Recent Labs Lab 04/22/14 0316 04/23/14 0308 04/24/14 0355 04/25/14 0543 04/26/14 0638  NA 137 139 137 135 136  K 4.5 4.3 4.4 4.6 4.8  CL 98 100 99 100 102  CO2 31 26 31 30 31   GLUCOSE 129* 91 89 138* 141*  BUN 33* 32* 33* 37* 42*  CREATININE 0.79 0.78 0.89 0.96 1.10  CALCIUM 8.8 8.8 8.6 8.7 8.7  MG  --   --  2.0  --   --    Liver Function Tests:  Recent Labs Lab 04/24/14 0355  AST 20  ALT 45*  ALKPHOS 76  BILITOT 0.6  PROT 5.6*  ALBUMIN 2.9*   No results for input(s): LIPASE, AMYLASE in the last 168 hours. No results for input(s): AMMONIA in the last 168 hours. CBC:  Recent Labs Lab 04/22/14 0316 04/23/14 0308 04/24/14 0355 04/25/14 0543 04/26/14 0638  WBC 8.0 10.4 12.0* 10.9* 9.2  NEUTROABS  --   --  7.7  --   --   HGB 9.3* 9.5* 10.0* 9.6* 9.0*  HCT 29.9* 30.9* 32.0* 31.2* 29.5*  MCV 80.8 82.0 84.2 83.0 83.8  PLT 100* 114* 140* 132* 133*   Cardiac Enzymes: No results for input(s): CKTOTAL, CKMB, CKMBINDEX, TROPONINI in the last 168 hours. BNP (last 3 results)  Recent Labs  09/28/13 1304 10/07/13 1908  PROBNP 356.8* 919.7*   CBG:  Recent Labs Lab 04/25/14 1225 04/25/14 1707 04/25/14 2105 04/26/14 0738 04/26/14 1205  GLUCAP 212* 78 163* 133* 238*    Recent Results (from the past 240 hour(s))  Culture, blood (routine x 2)     Status: None   Collection Time: 04/16/14  8:50 PM  Result Value Ref Range Status   Specimen Description BLOOD LEFT FOREARM  Final   Special Requests BOTTLES DRAWN AEROBIC AND ANAEROBIC 5CC EACH  Final   Culture   Final    NO GROWTH 5 DAYS Performed at [facility]    Report Status 04/25/2014 FINAL  Final  Culture, blood (routine x 2)     Status: None   Collection Time: 04/16/14  9:11 PM  Result Value Ref Range Status   Specimen Description BLOOD LEFT HAND  Final   Special Requests BOTTLES DRAWN AEROBIC AND ANAEROBIC 5CC EACH  Final   Culture   Final     NO GROWTH 5 DAYS Performed at [facility]    Report Status 04/25/2014 FINAL  Final  Urine culture     Status: None   Collection Time: 04/16/14  9:25 PM  Result Value Ref Range Status   Specimen Description URINE, CATHETERIZED  Final   Special Requests NONE  Final   Colony Count NO GROWTH Performed at [facility]   Final   Culture NO GROWTH Performed at [facility]   Final   Report Status 04/18/2014 FINAL  Final  Urine culture     Status: None   Collection Time: 04/20/14 12:23 PM  Result Value Ref Range Status   Specimen Description URINE, CATHETERIZED  Final   Special Requests NONE  Final   Colony Count NO GROWTH Performed at [facility]   Final   Culture NO GROWTH Performed at [facility]   Final   Report Status 04/21/2014 FINAL  Final     Studies:  Recent x-ray studies have been reviewed in detail by the Attending Physician  Scheduled Meds:  Scheduled Meds: . aspirin EC  81 mg Oral Daily  . atorvastatin  40 mg Oral Daily  . azithromycin  500 mg Intravenous Q24H  . cholecalciferol  1,000 Units Oral Daily  . enoxaparin (LOVENOX) injection  40 mg Subcutaneous Q24H  . ferrous sulfate  325 mg Oral Q breakfast  . furosemide  40 mg Oral BID  . hydrALAZINE  25 mg Oral 3 times per day  . insulin aspart  0-15 Units Subcutaneous TID WC  . insulin aspart  0-5 Units Subcutaneous QHS  . insulin aspart  3 Units Subcutaneous TID WC  . insulin glargine  40 Units Subcutaneous BID  . ipratropium-albuterol  3 mL Nebulization BID  . letrozole  2.5 mg Oral Daily  . levothyroxine  100 mcg Oral QAC breakfast  . lisinopril  10 mg Oral Daily  . loratadine  10 mg Oral Daily  . magnesium oxide  400 mg Oral BID  . morphine  15 mg Oral q morning - 10a  . morphine  30 mg Oral Q2200  . pantoprazole  40 mg Oral BID  . polyethylene glycol  17 g Oral Daily  . potassium chloride SA  20 mEq Oral Daily  . pregabalin  150 mg Oral BID  . rOPINIRole   2 mg Oral QHS    Time spent on care of this patient: 25 mins   Oswald Pott M.D. [facility] 04/26/2014, 1:15 PM  Pager: [phone]

## 2014-04-27 LAB — GLUCOSE, CAPILLARY
GLUCOSE-CAPILLARY: 155 mg/dL — AB (ref 70–99)
GLUCOSE-CAPILLARY: 150 mg/dL — AB (ref 70–99)
GLUCOSE-CAPILLARY: 216 mg/dL — AB (ref 70–99)
Glucose-Capillary: 141 mg/dL — ABNORMAL HIGH (ref 70–99)

## 2014-04-27 MED ORDER — DOCUSATE SODIUM 100 MG PO CAPS
100.0000 mg | ORAL_CAPSULE | Freq: Two times a day (BID) | ORAL | Status: DC
  Administered 2014-04-27: 100 mg via ORAL
  Filled 2014-04-27 (×3): qty 1

## 2014-04-27 MED ORDER — MENTHOL 3 MG MT LOZG
1.0000 | LOZENGE | OROMUCOSAL | Status: DC | PRN
  Filled 2014-04-27: qty 9

## 2014-04-27 MED ORDER — SORBITOL 70 % SOLN
960.0000 mL | TOPICAL_OIL | Freq: Once | ORAL | Status: AC
  Administered 2014-04-27: 960 mL via RECTAL
  Filled 2014-04-27: qty 240

## 2014-04-27 MED ORDER — BENZONATATE 100 MG PO CAPS
100.0000 mg | ORAL_CAPSULE | Freq: Three times a day (TID) | ORAL | Status: DC
  Administered 2014-04-27 – 2014-04-29 (×7): 100 mg via ORAL
  Filled 2014-04-27 (×9): qty 1

## 2014-04-27 MED ORDER — POLYETHYLENE GLYCOL 3350 17 G PO PACK
17.0000 g | PACK | Freq: Every day | ORAL | Status: DC
  Administered 2014-04-28: 17 g via ORAL
  Filled 2014-04-27 (×3): qty 1

## 2014-04-27 MED ORDER — FUROSEMIDE 40 MG PO TABS
40.0000 mg | ORAL_TABLET | Freq: Every day | ORAL | Status: DC
  Administered 2014-04-28 – 2014-04-29 (×2): 40 mg via ORAL
  Filled 2014-04-27 (×2): qty 1

## 2014-04-27 MED ORDER — GUAIFENESIN ER 600 MG PO TB12
1200.0000 mg | ORAL_TABLET | Freq: Two times a day (BID) | ORAL | Status: DC
  Administered 2014-04-27 – 2014-04-29 (×5): 1200 mg via ORAL
  Filled 2014-04-27 (×6): qty 2

## 2014-04-27 NOTE — Progress Notes (Signed)
Brooke Mccullough HYQ:[id number] DOB: 09-27-1945 DOA: 04/16/2014   PCP: Tammi Sou, MD  Brief history of present illness 69 year old WF PMHx hypertension, dyslipidemia, diabetes, and COPD on chronic oxygen who presented with shortness of breath, cough, and fever. Patient's husband recently diagnosed with influenza. Chest x-ray consistent with interstitial pneumonitis. Patient had fever up to 101.4 in the ER. Significant hypoxemia requiring BiPAP. Mildly troponin positive but EKG unremarkable without evidence of ischemia.  Since admission patient has had issues of persistent hypoxemia requiring BiPAP and high flow oxygen. Because of recent ankle fracture with edema there were concerns for DVT but venous duplex was negative. On 12/29 patient had respiratory decompensation and was consistent with volume overload. She has been successfully diuresed with IV Lasix. Last echocardiogram was in June 2015 which revealed only diastolic dysfunction. Given significance and volume of diuresis concern patient may have had changes so repeat echocardiogram is pending. Currently has weaned to 3 L oxygen.   Assessment/Plan:  Acute on chronic hypoxic respiratory failure due to pneumonitis and COPD exacerbation +/- pulmonary edema due to acute diastolic CHF exacerbation  -Husband positive for influenza so initailly presumed patient also positive; influenza PCR was negative, but has already complete course of tamiflu. CTA neg for PE but revealed RLL and mild RUL infiltrate with adenopathy  -Currently on Zithromax, off steroids, antitussives, duo nebs, slowly improving.  Acute decompensated diastolic dysfunction/cardiomyopathy, improved -Continue Lasix, decrease to 40 mg daily, net negative balance of 27.5 L, admission weight 223 lbs down to 209lbs - 2-D echo on 12/31 showed EF of 95-28%, grade 1 diastolic dysfunction.  Recent R ankle fracture with leg edema, chronic pain syndrome -Venous duplex negative  for DVT -Continue MS Contin 15 mg BID -And oxycodone IR 5 mg -10 mg every 4 hours PRN to pain  -surgery was on 02/11/14 and at that dc was non weight bearing, patient has appointment with Dr. Berenice Primas on 05/04/14  Elevated troponin -Only mildly elevated and repeat ECG unremarkable consistent with demand ischemia - no chest pain   Diabetes type 2 : uncontrolled -Continue Lantus 40 units BID, increase NovoLog to 5 units  QAC, continue moderate sliding scale   Thrombocytopenia/Anemia - appears chronic with baseline 90,000 range (at baseline), currently improved - anemia panel unremarkable  HTN -cont Lisinopril 10 mg daily -Continue Hydralazine 25 mg  TID   Constipation - No BM in the last 5 days, smog enema today  Consultants: NA   Procedures Lower extremity venous duplex: Negative 12/31 echocardiogram: - Left ventricle: mild concentric hypertrophy. -LVEF= 60% to 65%.  - (grade 1diastolic dysfunction). - Left atrium: The atrium was mildly to moderately dilated.  Culture 12/25 blood negative so far 12/25 urine NGTD. 12/29 urine NGTD  Antibiotics: Zithromax 12/26 > Tamiflu 12/25 > 12/30   Subjective Feeling whole lot better today, still coughing, still constipated  Objective: VITAL SIGNS: BP 114/51 mmHg  Pulse 69  Temp(Src) 98.4 F (36.9 C) (Oral)  Resp 18  Ht 5\' 3"  (1.6 m)  Wt 95.2 kg (209 lb 14.1 oz)  BMI 37.19 kg/m2  SpO2 97%  Intake/Output Summary (Last 24 hours) at 04/27/14 1143 Last data filed at 04/27/14 1033  Gross per 24 hour  Intake   1702 ml  Output   5025 ml  Net  -3323 ml     Exam: General: A/O 4, NAD, No acute respiratory distress Lungs: Bibasilar rhonchi improving CVS: RRR, without murmur gallop or rub, normal S1 and S2 Abdomen: Nontender, nondistended, soft, bowel  sounds positive, no rebound. Extremities: No significant c/c, LLE 1-2+ (per patient baseline) right lower extremity in cam boot  Data Reviewed: Basic Metabolic  Panel:  Recent Labs Lab 04/22/14 0316 04/23/14 0308 04/24/14 0355 04/25/14 0543 04/26/14 0638  NA 137 139 137 135 136  K 4.5 4.3 4.4 4.6 4.8  CL 98 100 99 100 102  CO2 31 26 31 30 31   GLUCOSE 129* 91 89 138* 141*  BUN 33* 32* 33* 37* 42*  CREATININE 0.79 0.78 0.89 0.96 1.10  CALCIUM 8.8 8.8 8.6 8.7 8.7  MG  --   --  2.0  --   --    Liver Function Tests:  Recent Labs Lab 04/24/14 0355  AST 20  ALT 45*  ALKPHOS 76  BILITOT 0.6  PROT 5.6*  ALBUMIN 2.9*   No results for input(s): LIPASE, AMYLASE in the last 168 hours. No results for input(s): AMMONIA in the last 168 hours. CBC:  Recent Labs Lab 04/22/14 0316 04/23/14 0308 04/24/14 0355 04/25/14 0543 04/26/14 0638  WBC 8.0 10.4 12.0* 10.9* 9.2  NEUTROABS  --   --  7.7  --   --   HGB 9.3* 9.5* 10.0* 9.6* 9.0*  HCT 29.9* 30.9* 32.0* 31.2* 29.5*  MCV 80.8 82.0 84.2 83.0 83.8  PLT 100* 114* 140* 132* 133*   Cardiac Enzymes: No results for input(s): CKTOTAL, CKMB, CKMBINDEX, TROPONINI in the last 168 hours. BNP (last 3 results)  Recent Labs  09/28/13 1304 10/07/13 1908  PROBNP 356.8* 919.7*   CBG:  Recent Labs Lab 04/26/14 0738 04/26/14 1205 04/26/14 1628 04/26/14 2220 04/27/14 0813  GLUCAP 133* 238* 89 146* 155*    Recent Results (from the past 240 hour(s))  Urine culture     Status: None   Collection Time: 04/20/14 12:23 PM  Result Value Ref Range Status   Specimen Description URINE, CATHETERIZED  Final   Special Requests NONE  Final   Colony Count NO GROWTH Performed at [facility]   Final   Culture NO GROWTH Performed at [facility]   Final   Report Status 04/21/2014 FINAL  Final     Studies:  Recent x-ray studies have been reviewed in detail by the Attending Physician  Scheduled Meds:  Scheduled Meds: . aspirin EC  81 mg Oral Daily  . atorvastatin  40 mg Oral Daily  . azithromycin  500 mg Intravenous Q24H  . cholecalciferol  1,000 Units Oral Daily  .  enoxaparin (LOVENOX) injection  40 mg Subcutaneous Q24H  . ferrous sulfate  325 mg Oral Q breakfast  . furosemide  40 mg Oral BID  . hydrALAZINE  25 mg Oral 3 times per day  . insulin aspart  0-15 Units Subcutaneous TID WC  . insulin aspart  0-5 Units Subcutaneous QHS  . insulin aspart  5 Units Subcutaneous TID WC  . insulin glargine  40 Units Subcutaneous BID  . ipratropium-albuterol  3 mL Nebulization BID  . letrozole  2.5 mg Oral Daily  . levothyroxine  100 mcg Oral QAC breakfast  . lisinopril  10 mg Oral Daily  . loratadine  10 mg Oral Daily  . magnesium oxide  400 mg Oral BID  . morphine  15 mg Oral q morning - 10a  . morphine  30 mg Oral Q2200  . pantoprazole  40 mg Oral BID  . polyethylene glycol  17 g Oral Daily  . potassium chloride SA  20 mEq Oral Daily  . pregabalin  150 mg Oral BID  . rOPINIRole  2 mg Oral QHS    Time spent on care of this patient: 25 mins   Dagen Beevers M.D. [facility] 04/27/2014, 11:43 AM  Pager: [phone]

## 2014-04-27 NOTE — Clinical Social Work Note (Signed)
CSW continues to follow patient for DC needs. Patient has bed at [facility] once medically stable. CSW will follow.   Liz Beach MSW, [place], [place], [phone]

## 2014-04-27 NOTE — Progress Notes (Signed)
Physical Therapy Treatment Patient Details Name: Brooke Mccullough MRN: [id number] DOB: 1945-11-12 Today's Date: 04/27/2014    History of Present Illness Pt 69 yo female admitted 12/25 with cough and SOB. Pt with h/o recent R ankle fx and old L ankle fax. Pt with CHF, anxiety and COPD.    PT Comments    Showed good strength during general exercise program, which should translate into decent function once she is allowed to put more weight on R LE.  As of 04/27/14, per Gaspar Skeeters, PA, pt can be PWB 25% on the foot.  Follow Up Recommendations  SNF;Supervision/Assistance - 24 hour     Equipment Recommendations  None recommended by PT    Recommendations for Other Services       Precautions / Restrictions Precautions Precautions: Fall Restrictions Weight Bearing Restrictions: Yes RLE Weight Bearing: Non weight bearing (but should have been updated--call to Dr. Berenice Primas.)    Mobility  Bed Mobility                  Transfers                    Ambulation/Gait                 Stairs            Wheelchair Mobility    Modified Rankin (Stroke Patients Only)       Balance                                    Cognition Arousal/Alertness: Awake/alert Behavior During Therapy: WFL for tasks assessed/performed Overall Cognitive Status: Within Functional Limits for tasks assessed                      Exercises General Exercises - Lower Extremity Ankle Circles/Pumps: AROM;Left;15 reps;Supine Quad Sets: AROM;Strengthening;Both;10 reps;Supine Gluteal Sets: AROM;Strengthening;Both;10 reps;Supine Heel Slides: AROM;Strengthening;Both;10 reps;Supine (resisted flexion and extension-- at distal femur on R) Hip ABduction/ADduction: AROM;Strengthening;Both;15 reps;Supine Straight Leg Raises: AROM;Strengthening;Both;10 reps;Supine Other Exercises Other Exercises: bicep/tricep presses unisolated for shoulder assist x 10 resisted     General Comments        Pertinent Vitals/Pain Pain Assessment: 0-10 Pain Score: 7  Pain Location: R ankle Pain Intervention(s): Limited activity within patient's tolerance    Home Living                      Prior Function            PT Goals (current goals can now be found in the care plan section) Acute Rehab PT Goals Patient Stated Goal: to breathe better PT Goal Formulation: With patient Time For Goal Achievement: 04/28/14 Potential to Achieve Goals: Good Progress towards PT goals: Progressing toward goals    Frequency  Min 2X/week    PT Plan Current plan remains appropriate    Co-evaluation             End of Session           Time: 8416-6063 PT Time Calculation (min) (ACUTE ONLY): 24 min  Charges:  $Therapeutic Exercise: 23-37 mins                    G Codes:      Keivon Garden, Tessie Fass 04/27/2014, 4:19 PM  04/27/2014  Donnella Sham, PT [phone]  (pager)

## 2014-04-28 LAB — HEMOGLOBIN AND HEMATOCRIT, BLOOD
HEMATOCRIT: 31.6 % — AB (ref 36.0–46.0)
HEMOGLOBIN: 9.8 g/dL — AB (ref 12.0–15.0)

## 2014-04-28 LAB — GLUCOSE, CAPILLARY
GLUCOSE-CAPILLARY: 173 mg/dL — AB (ref 70–99)
Glucose-Capillary: 164 mg/dL — ABNORMAL HIGH (ref 70–99)
Glucose-Capillary: 256 mg/dL — ABNORMAL HIGH (ref 70–99)
Glucose-Capillary: 117 mg/dL — ABNORMAL HIGH (ref 70–99)

## 2014-04-28 MED ORDER — HYDROCORTISONE ACETATE 25 MG RE SUPP
25.0000 mg | Freq: Two times a day (BID) | RECTAL | Status: DC
  Administered 2014-04-28 – 2014-04-29 (×3): 25 mg via RECTAL
  Filled 2014-04-28 (×4): qty 1

## 2014-04-28 NOTE — Progress Notes (Signed)
VSS - Blood pressure 109/39, pulse 97, temperature 99.6 F (37.6 C), temperature source Oral, resp. rate 18, height 5\' 3"  (1.6 m), weight 94.2 kg (207 lb 10.8 oz), SpO2 100 %., O2   @ 3L.  Dr. Tana Coast text paged and informed of low BP and that pt. Was scheduled to received lisinopril & hydralazine, did she want them held?  Return call from Dr. Tana Coast and stated yes hold medication for now.  Will continue to monitor.  Alphonzo Lemmings, RN

## 2014-04-28 NOTE — Progress Notes (Signed)
Brooke Mccullough WUJ:[id number] DOB: [birth year] DOA: 04/16/2014   PCP: Tammi Sou, MD  Brief history of present illness 69 year old WF PMHx hypertension, dyslipidemia, diabetes, and COPD on chronic oxygen who presented with shortness of breath, cough, and fever. Patient's husband recently diagnosed with influenza. Chest x-ray consistent with interstitial pneumonitis. Patient had fever up to 101.4 in the ER. Significant hypoxemia requiring BiPAP. Mildly troponin positive but EKG unremarkable without evidence of ischemia.  Since admission patient has had issues of persistent hypoxemia requiring BiPAP and high flow oxygen. Because of recent ankle fracture with edema there were concerns for DVT but venous duplex was negative. On 12/29 patient had respiratory decompensation and was consistent with volume overload. She has been successfully diuresed with IV Lasix. Last echocardiogram was in June 2015 which revealed only diastolic dysfunction. Given significance and volume of diuresis concern patient may have had changes so repeat echocardiogram is pending. Currently has weaned to 3 L oxygen.   Assessment/Plan:  Rectal bleeding -Patient was severely constipated for 6 days until patient received smog enema yesterday. Patient had 2 bowel movements yesterday with no bleeding however this morning having bright red blood with bowel movement. Discussed with gastroenterology, possibly due to stercoral ulcer from hard stools in the rectum, recommended observe today for any repeat rectal bleeding. - No need of colonoscopy unless she is having active bleeding, hemoglobin stable currently. GI recommended continuing anusol suppositories and MiraLAX. DC ferrous sulfate - Patient had prior endoscopy and colonoscopy 2 years ago in [place] which were normal. Patient relocated to [facility] last year in May 2015.  Acute on chronic hypoxic respiratory failure due to pneumonitis and COPD  exacerbation +/- pulmonary edema due to acute diastolic CHF exacerbation  -Husband positive for influenza so initailly presumed patient also positive; influenza PCR was negative, but has already complete course of tamiflu. CTA neg for PE but revealed RLL and mild RUL infiltrate with adenopathy  -Currently on Zithromax, off steroids, antitussives, duo nebs, slowly improving.  Acute decompensated diastolic dysfunction/cardiomyopathy, improved -Continue Lasix, decrease to 40 mg daily, net negative balance of 28.2 L, admission weight 223 lbs down to 207lbs - 2-D echo on 12/31 showed EF of 95-62%, grade 1 diastolic dysfunction.  Recent R ankle fracture with leg edema, chronic pain syndrome -Venous duplex negative for DVT -Continue MS Contin 15 mg BID -And oxycodone IR 5 mg -10 mg every 4 hours PRN to pain  -surgery was on 02/11/14 and at that dc was non weight bearing, patient has appointment with Dr. Berenice Primas on 05/04/14  Elevated troponin -Only mildly elevated and repeat ECG unremarkable consistent with demand ischemia - no chest pain   Diabetes type 2 : uncontrolled -Continue Lantus 40 units BID, increase NovoLog to 5 units  QAC, continue moderate sliding scale   Thrombocytopenia/Anemia - appears chronic with baseline 90,000 range (at baseline), currently improved - anemia panel unremarkable  HTN- BP somewhat soft -cont Lisinopril 10 mg daily,  DC hydralazine   Consultants: NA   Procedures Lower extremity venous duplex: Negative 12/31 echocardiogram: - Left ventricle: mild concentric hypertrophy. -LVEF= 60% to 65%.  - (grade 1diastolic dysfunction). - Left atrium: The atrium was mildly to moderately dilated.  Culture 12/25 blood negative so far 12/25 urine NGTD. 12/29 urine NGTD  Antibiotics: Zithromax 12/26 > Tamiflu 12/25 > 12/30   Subjective Shortness of breath is improving, breathing better however rectal bleeding this morning  Objective: VITAL SIGNS: BP 109/39  mmHg  Pulse 97  Temp(Src) 99.6  F (37.6 C) (Oral)  Resp 18  Ht 5\' 3"  (1.6 m)  Wt 94.2 kg (207 lb 10.8 oz)  BMI 36.80 kg/m2  SpO2 100%  Intake/Output Summary (Last 24 hours) at 04/28/14 1204 Last data filed at 04/28/14 0956  Gross per 24 hour  Intake    540 ml  Output   1200 ml  Net   -660 ml     Exam: General: A/O 4, NAD, No acute respiratory distress Lungs: Bibasilar rhonchi improving CVS: RRR, without murmur gallop or rub, normal S1 and S2 Abdomen: Nontender, nondistended, soft, bowel sounds positive, no rebound. Extremities: No significant c/c, LLE 1-2+ (per patient baseline) right lower extremity in cam boot  Data Reviewed: Basic Metabolic Panel:  Recent Labs Lab 04/22/14 0316 04/23/14 0308 04/24/14 0355 04/25/14 0543 04/26/14 0638  NA 137 139 137 135 136  K 4.5 4.3 4.4 4.6 4.8  CL 98 100 99 100 102  CO2 31 26 31 30 31   GLUCOSE 129* 91 89 138* 141*  BUN 33* 32* 33* 37* 42*  CREATININE 0.79 0.78 0.89 0.96 1.10  CALCIUM 8.8 8.8 8.6 8.7 8.7  MG  --   --  2.0  --   --    Liver Function Tests:  Recent Labs Lab 04/24/14 0355  AST 20  ALT 45*  ALKPHOS 76  BILITOT 0.6  PROT 5.6*  ALBUMIN 2.9*   No results for input(s): LIPASE, AMYLASE in the last 168 hours. No results for input(s): AMMONIA in the last 168 hours. CBC:  Recent Labs Lab 04/22/14 0316 04/23/14 0308 04/24/14 0355 04/25/14 0543 04/26/14 9924 04/28/14 1008  WBC 8.0 10.4 12.0* 10.9* 9.2  --   NEUTROABS  --   --  7.7  --   --   --   HGB 9.3* 9.5* 10.0* 9.6* 9.0* 9.8*  HCT 29.9* 30.9* 32.0* 31.2* 29.5* 31.6*  MCV 80.8 82.0 84.2 83.0 83.8  --   PLT 100* 114* 140* 132* 133*  --    Cardiac Enzymes: No results for input(s): CKTOTAL, CKMB, CKMBINDEX, TROPONINI in the last 168 hours. BNP (last 3 results)  Recent Labs  09/28/13 1304 10/07/13 1908  PROBNP 356.8* 919.7*   CBG:  Recent Labs Lab 04/27/14 0813 04/27/14 1154 04/27/14 1712 04/27/14 2153 04/28/14 0806  GLUCAP 155*  150* 141* 216* 164*    Recent Results (from the past 240 hour(s))  Urine culture     Status: None   Collection Time: 04/20/14 12:23 PM  Result Value Ref Range Status   Specimen Description URINE, CATHETERIZED  Final   Special Requests NONE  Final   Colony Count NO GROWTH Performed at [facility]   Final   Culture NO GROWTH Performed at [facility]   Final   Report Status 04/21/2014 FINAL  Final     Studies:  Recent x-ray studies have been reviewed in detail by the Attending Physician  Scheduled Meds:  Scheduled Meds: . atorvastatin  40 mg Oral Daily  . azithromycin  500 mg Intravenous Q24H  . benzonatate  100 mg Oral TID  . cholecalciferol  1,000 Units Oral Daily  . furosemide  40 mg Oral Daily  . guaiFENesin  1,200 mg Oral BID  . hydrALAZINE  25 mg Oral 3 times per day  . hydrocortisone  25 mg Rectal BID  . insulin aspart  0-15 Units Subcutaneous TID WC  . insulin aspart  0-5 Units Subcutaneous QHS  . insulin aspart  5 Units Subcutaneous  TID WC  . insulin glargine  40 Units Subcutaneous BID  . ipratropium-albuterol  3 mL Nebulization BID  . letrozole  2.5 mg Oral Daily  . levothyroxine  100 mcg Oral QAC breakfast  . lisinopril  10 mg Oral Daily  . loratadine  10 mg Oral Daily  . magnesium oxide  400 mg Oral BID  . morphine  15 mg Oral q morning - 10a  . morphine  30 mg Oral Q2200  . pantoprazole  40 mg Oral BID  . polyethylene glycol  17 g Oral Daily  . polyethylene glycol  17 g Oral Daily  . potassium chloride SA  20 mEq Oral Daily  . pregabalin  150 mg Oral BID  . rOPINIRole  2 mg Oral QHS    Time spent on care of this patient: 25 mins   RAI,RIPUDEEP M.D. [facility] 04/28/2014, 12:04 PM  Pager: [phone]

## 2014-04-28 NOTE — Clinical Social Work Note (Signed)
CSW met with patient and patient's husband at bedside. Patient is aware that she will likely be ready for DC on 04/29/14.    Bryant  MSW, LCSWA, LCASA, [phone] 

## 2014-04-29 LAB — CBC
HCT: 29.4 % — ABNORMAL LOW (ref 36.0–46.0)
HEMOGLOBIN: 9.1 g/dL — AB (ref 12.0–15.0)
MCH: 25.3 pg — AB (ref 26.0–34.0)
MCHC: 31 g/dL (ref 30.0–36.0)
MCV: 81.9 fL (ref 78.0–100.0)
Platelets: 124 10*3/uL — ABNORMAL LOW (ref 150–400)
RBC: 3.59 MIL/uL — ABNORMAL LOW (ref 3.87–5.11)
RDW: 14.7 % (ref 11.5–15.5)
WBC: 7.8 10*3/uL (ref 4.0–10.5)

## 2014-04-29 LAB — BASIC METABOLIC PANEL
Anion gap: 7 (ref 5–15)
BUN: 38 mg/dL — ABNORMAL HIGH (ref 6–23)
CALCIUM: 8.9 mg/dL (ref 8.4–10.5)
CO2: 29 mmol/L (ref 19–32)
Chloride: 100 mEq/L (ref 96–112)
Creatinine, Ser: 0.92 mg/dL (ref 0.50–1.10)
GFR calc Af Amer: 72 mL/min — ABNORMAL LOW (ref >90)
GFR calc non Af Amer: 62 mL/min — ABNORMAL LOW (ref >90)
Glucose, Bld: 181 mg/dL — ABNORMAL HIGH (ref 70–99)
Potassium: 4.8 mmol/L (ref 3.5–5.1)
SODIUM: 136 mmol/L (ref 135–145)

## 2014-04-29 LAB — GLUCOSE, CAPILLARY
Glucose-Capillary: 160 mg/dL — ABNORMAL HIGH (ref 70–99)
Glucose-Capillary: 212 mg/dL — ABNORMAL HIGH (ref 70–99)

## 2014-04-29 MED ORDER — BENZONATATE 100 MG PO CAPS: 100.0000 mg | ORAL_CAPSULE | Freq: Three times a day (TID) | ORAL | Status: DC

## 2014-04-29 MED ORDER — MORPHINE SULFATE ER 15 MG PO TBCR
15.0000 mg | EXTENDED_RELEASE_TABLET | Freq: Two times a day (BID) | ORAL | Status: DC
Start: 2014-04-29 — End: 2014-07-06

## 2014-04-29 MED ORDER — HYDROCORTISONE ACETATE 25 MG RE SUPP: 25.0000 mg | Freq: Two times a day (BID) | RECTAL | Status: DC

## 2014-04-29 MED ORDER — OMEPRAZOLE 40 MG PO CPDR: 40.0000 mg | DELAYED_RELEASE_CAPSULE | Freq: Two times a day (BID) | ORAL | Status: DC

## 2014-04-29 MED ORDER — FUROSEMIDE 40 MG PO TABS: 40.0000 mg | ORAL_TABLET | Freq: Every day | ORAL | Status: DC

## 2014-04-29 MED ORDER — LUBIPROSTONE 24 MCG PO CAPS
24.0000 ug | ORAL_CAPSULE | Freq: Two times a day (BID) | ORAL | Status: DC
  Administered 2014-04-29: 24 ug via ORAL
  Filled 2014-04-29 (×3): qty 1

## 2014-04-29 MED ORDER — OXYCODONE HCL 5 MG PO TABS: 5.0000 mg | ORAL_TABLET | ORAL | Status: DC | PRN

## 2014-04-29 MED ORDER — MENTHOL 3 MG MT LOZG: 1.0000 | LOZENGE | OROMUCOSAL | Status: DC | PRN

## 2014-04-29 MED ORDER — POLYETHYLENE GLYCOL 3350 17 G PO PACK: 17.0000 g | PACK | Freq: Every day | ORAL | Status: DC

## 2014-04-29 MED ORDER — FUROSEMIDE 40 MG PO TABS: 40.0000 mg | ORAL_TABLET | Freq: Two times a day (BID) | ORAL | Status: DC

## 2014-04-29 MED ORDER — ALPRAZOLAM 0.5 MG PO TABS: 0.5000 mg | ORAL_TABLET | Freq: Two times a day (BID) | ORAL | Status: DC | PRN

## 2014-04-29 MED ORDER — GUAIFENESIN ER 600 MG PO TB12: 1200.0000 mg | ORAL_TABLET | Freq: Two times a day (BID) | ORAL | Status: DC

## 2014-04-29 MED ORDER — LUBIPROSTONE 24 MCG PO CAPS: 24.0000 ug | ORAL_CAPSULE | Freq: Two times a day (BID) | ORAL | Status: DC

## 2014-04-29 NOTE — Clinical Social Work Note (Signed)
Per MD patient ready to DC back to [facility]. RN, patient/family (patient states she has contacted her family and facility about her discharge for today), and facility notified of patient's DC. RN given number for report. DC packet on patient's chart. Ambulance transport requested for patient. CSW signing off at this time.   Liz Beach MSW, [place], [place], [phone]

## 2014-04-29 NOTE — Progress Notes (Signed)
Physical Therapy Treatment Patient Details Name: Brooke Mccullough MRN: [id number] DOB: [birth year] Today's Date: 04/29/2014    History of Present Illness Pt 69 yo female admitted 12/25 with cough and SOB. Pt with h/o recent R ankle fx and old L ankle fax. Pt with CHF, anxiety and COPD.    PT Comments    Progressing steadily.  Emphasis on transfer and gait sequencing.   Follow Up Recommendations  SNF;Supervision/Assistance - 24 hour     Equipment Recommendations  None recommended by PT    Recommendations for Other Services       Precautions / Restrictions Precautions Precautions: Fall Restrictions Weight Bearing Restrictions: Yes RLE Weight Bearing: Partial weight bearing RLE Partial Weight Bearing Percentage or Pounds: 25%    Mobility  Bed Mobility Overal bed mobility: Needs Assistance Bed Mobility: Supine to Sit     Supine to sit: Min assist;HOB elevated     General bed mobility comments: assist to bring hips to EOB  Transfers Overall transfer level: Needs assistance Equipment used: Rolling walker (2 wheeled) Transfers: Sit to/from Stand Sit to Stand: Min assist;+2 safety/equipment         General transfer comment: cues/demo of safe technique  Ambulation/Gait Ambulation/Gait assistance: Min Wellsite geologist (Feet): 10 Feet Assistive device: Rolling walker (2 wheeled) Gait Pattern/deviations: Step-to pattern Gait velocity: slow   General Gait Details: consistent cues for proper sequencing   Stairs            Wheelchair Mobility    Modified Rankin (Stroke Patients Only)       Balance Overall balance assessment: Needs assistance   Sitting balance-Leahy Scale: Fair       Standing balance-Leahy Scale: Poor                      Cognition Arousal/Alertness: Awake/alert Behavior During Therapy: WFL for tasks assessed/performed Overall Cognitive Status: Within Functional Limits for tasks assessed                       Exercises      General Comments General comments (skin integrity, edema, etc.): Emphasis on transfer technique/ education.      Pertinent Vitals/Pain Pain Assessment: Faces Faces Pain Scale: Hurts even more Pain Descriptors / Indicators: Discomfort Pain Intervention(s): Limited activity within patient's tolerance    Home Living                      Prior Function            PT Goals (current goals can now be found in the care plan section) Acute Rehab PT Goals Patient Stated Goal: to breathe better PT Goal Formulation: With patient Time For Goal Achievement: 05/01/14 Potential to Achieve Goals: Good Progress towards PT goals: Progressing toward goals    Frequency       PT Plan Current plan remains appropriate    Co-evaluation             End of Session Equipment Utilized During Treatment: Oxygen Activity Tolerance: Patient tolerated treatment well;Patient limited by fatigue Patient left: in chair;with call bell/phone within reach;with nursing/sitter in room     Time: 1440-1506 PT Time Calculation (min) (ACUTE ONLY): 26 min  Charges:  $Therapeutic Activity: 23-37 mins                    G Codes:      Jasiel Belisle, Tessie Fass 04/29/2014, 3:46 PM 04/29/2014  Yvone Neu  [place], [place] [phone]  (pager)

## 2014-04-29 NOTE — Progress Notes (Signed)
Pt. Having some rectal bleeding this am.  Dr. Tana Coast came in room and was informed.  Will continue to monitor.  Alphonzo Lemmings, RN

## 2014-04-29 NOTE — Discharge Summary (Addendum)
Physician Discharge Summary  Patient ID: Brooke Mccullough MRN: [id number] DOB/AGE: 1945/09/04 69 y.o.  Admit date: 04/16/2014 Discharge date: 04/29/2014  Primary Care Physician:  Tammi Sou, MD  Discharge Diagnoses:   Acute on chronic hypoxic respiratory failure  Acute COPD exacerbation with pneumonitis Acute decompensated diastolic CHF exacerbation  Recent right ankle fracture with leg edema . rectal bleeding resolved . Viral URI . diabetes mellitus type 2 uncontrolled  . HTN (hypertension), benign . COPD (chronic obstructive pulmonary disease) . Hypothyroidism . Chronic pain syndrome  Consults: None   Recommendations for Outpatient Follow-up:  Patient was started on MiraLAX and Amitiza for constipation. She may need more stool softeners if she continues to be on long-acting morphine and having constipation issues.  Patient can have partial weight bearing 25% on the right lower extremity per orthopedics as of 04/27/14.   DIET: Cardiac modified diet    Allergies:   Allergies  Allergen Reactions  . Ciprofloxacin Other (See Comments)    Unspecified--noted in old PCP's records.  . Latex Other (See Comments)    Unspecified: noted in old PCP's records.  . Clindamycin/Lincomycin Rash  . Fentanyl Rash    Duragesic Patch  . Indocin [Indomethacin] Rash  . Keflex [Cephalexin] Rash  . Penicillins Rash  . Vancomycin Rash     Discharge Medications:   Medication List    STOP taking these medications        aspirin EC 81 MG tablet     doxycycline 100 MG tablet  Commonly known as:  VIBRA-TABS     ferrous sulfate 325 (65 FE) MG tablet     oseltamivir 75 MG capsule  Commonly known as:  TAMIFLU     oxyCODONE-acetaminophen 5-325 MG per tablet  Commonly known as:  PERCOCET/ROXICET     potassium chloride SA 20 MEQ tablet  Commonly known as:  K-DUR,KLOR-CON      TAKE these medications        albuterol 108 (90 BASE) MCG/ACT inhaler  Commonly known as:   PROVENTIL HFA;VENTOLIN HFA  Inhale 2 puffs into the lungs every 6 (six) hours as needed for wheezing or shortness of breath.     albuterol (2.5 MG/3ML) 0.083% nebulizer solution  Commonly known as:  PROVENTIL  Take 3 mLs (2.5 mg total) by nebulization every 4 (four) hours as needed for wheezing or shortness of breath.     ALPRAZolam 0.5 MG tablet  Commonly known as:  XANAX  Take 1 tablet (0.5 mg total) by mouth 2 (two) times daily as needed for anxiety or sleep.     atorvastatin 40 MG tablet  Commonly known as:  LIPITOR  Take 1 tablet (40 mg total) by mouth daily.     benzonatate 100 MG capsule  Commonly known as:  TESSALON  Take 1 capsule (100 mg total) by mouth 3 (three) times daily. X 10 days     cetirizine 10 MG tablet  Commonly known as:  ZYRTEC  Take 10 mg by mouth daily as needed for allergies.     cholecalciferol 1000 UNITS tablet  Commonly known as:  VITAMIN D  Take 1,000 Units by mouth daily.     citalopram 40 MG tablet  Commonly known as:  CELEXA  Take 40 mg by mouth daily.     furosemide 40 MG tablet  Commonly known as:  LASIX  Take 1 tablet (40 mg total) by mouth daily.  Start taking on:  04/30/2014     guaiFENesin 600 MG 12 hr tablet  Commonly known as:  MUCINEX  Take 2 tablets (1,200 mg total) by mouth 2 (two) times daily.     hydrocortisone 25 MG suppository  Commonly known as:  ANUSOL-HC  Place 1 suppository (25 mg total) rectally 2 (two) times daily. X 2 weeks     insulin aspart 100 UNIT/ML injection  Commonly known as:  novoLOG  Inject 6 Units into the skin 3 (three) times daily before meals.     Insulin Glargine 100 UNIT/ML Solostar Pen  Commonly known as:  LANTUS  Inject 40 Units into the skin 2 (two) times daily. 40 units BID     Insulin Pen Needle 32G X 4 MM Misc  Commonly known as:  BD PEN NEEDLE NANO U/F  Use pen needles as directed.     letrozole 2.5 MG tablet  Commonly known as:  FEMARA  Take 1 tablet (2.5 mg total) by mouth daily.      levothyroxine 100 MCG tablet  Commonly known as:  SYNTHROID, LEVOTHROID  Take 1 tablet (100 mcg total) by mouth daily before breakfast.     lisinopril 10 MG tablet  Commonly known as:  PRINIVIL,ZESTRIL  Take 1 tablet (10 mg total) by mouth daily.     lubiprostone 24 MCG capsule  Commonly known as:  AMITIZA  Take 1 capsule (24 mcg total) by mouth 2 (two) times daily with a meal. For constipation     Magnesium Oxide 500 MG (LAX) Tabs  Take 500 mg by mouth 2 (two) times daily.     meclizine 12.5 MG tablet  Commonly known as:  ANTIVERT  Take 12.5 mg by mouth every 4 (four) hours as needed for dizziness. Take 12.5 mg every 4 hours x 3 doses as needed     menthol-cetylpyridinium 3 MG lozenge  Commonly known as:  CEPACOL  Take 1 lozenge (3 mg total) by mouth as needed for sore throat.     morphine 15 MG 12 hr tablet  Commonly known as:  MS CONTIN  Take 1-2 tablets (15-30 mg total) by mouth 2 (two) times daily. Take 15 mg every morning and 30 mg at bedtime     omeprazole 40 MG capsule  Commonly known as:  PRILOSEC  Take 1 capsule (40 mg total) by mouth 2 (two) times daily.     oxyCODONE 5 MG immediate release tablet  Commonly known as:  Oxy IR/ROXICODONE  Take 1-2 tablets (5-10 mg total) by mouth every 4 (four) hours as needed for moderate pain.     polyethylene glycol packet  Commonly known as:  MIRALAX / GLYCOLAX  Take 17 g by mouth daily.     pregabalin 150 MG capsule  Commonly known as:  LYRICA  Take 1 capsule (150 mg total) by mouth 2 (two) times daily.     rOPINIRole 2 MG tablet  Commonly known as:  REQUIP  Take 2 mg by mouth at bedtime.     tiotropium 18 MCG inhalation capsule  Commonly known as:  SPIRIVA  Place 18 mcg into inhaler and inhale daily.     TUSSIN DM 10-100 MG/5ML liquid  Generic drug:  dextromethorphan-guaiFENesin  Take 10 mLs by mouth every 4 (four) hours as needed for cough.         Brief H and P: For complete details please refer to  admission H and P, but in brief22 year old WF PMHx hypertension, dyslipidemia, diabetes, and COPD on chronic oxygen who presented with shortness of breath, cough, and fever. Patient's husband  recently diagnosed with influenza. Chest x-ray consistent with interstitial pneumonitis. Patient had fever up to 101.4 in the ER. Significant hypoxemia requiring BiPAP. Mildly troponin positive but EKG unremarkable without evidence of ischemia.  Since admission patient has had issues of persistent hypoxemia requiring BiPAP and high flow oxygen. Because of recent ankle fracture with edema there were concerns for DVT but venous duplex was negative. On 12/29 patient had respiratory decompensation and was consistent with volume overload. She has been successfully diuresed with IV Lasix. Last echocardiogram was in June 2015 which revealed only diastolic dysfunction. Given significance and volume of diuresis concern patient may have had changes so repeat echocardiogram is pending. Currently has weaned to 3 L oxygen.   Hospital Course:   Acute on chronic hypoxic respiratory failure due to pneumonitis and COPD exacerbation +/- pulmonary edema due to acute diastolic CHF exacerbation  -Husband was positive for influenza so initailly presumed patient also positive; influenza PCR was however negative, but has already complete course of tamiflu. CTA neg for PE but revealed RLL and mild RUL infiltrate with adenopathy  -Patient has completed antibiotics, does not need any further antibiotics, off steroids. Continue nebulizers, antitussives as needed, pulmonary hygiene.  Acute decompensated diastolic dysfunction/cardiomyopathy, improved - net negative balance of 29.74 L, admission weight 223 lbs down to 208lbs, resume outpatient Lasix - 2-D echo on 12/31 showed EF of 25-36%, grade 1 diastolic dysfunction.   Rectal bleeding -Patient was severely constipated for 6 days until patient received smog enema on 04/27/14. Patient had 2  bowel movements yesterday with no bleeding however this morning having bright red blood with bowel movement. Discussed with gastroenterology, possibly due to stercoral ulcer from hard stools in the rectum, recommended observe today for any repeat rectal bleeding. No need of colonoscopy unless she is having active bleeding, hemoglobin stable currently. GI recommended continuing anusol suppositories and MiraLAX. DC ferrous sulfate - Patient had prior endoscopy and colonoscopy 2 years ago in [place] which were normal. Patient relocated to [facility] last year in May 2015.  Recent R ankle fracture with leg edema, chronic pain syndrome -Venous duplex negative for DVT -Continue MS Contin 15 mg BID -And oxycodone IR 5 mg -10 mg every 4 hours PRN to pain  -surgery was on 02/11/14 and at that dc was non weight bearing, however per orthopedics as of 04/27/14, patient can have partial weightbearing 25% on the foot. She has appointment with Dr. Berenice Primas on 05/04/14  Elevated troponin -Only mildly elevated and repeat ECG unremarkable consistent with demand ischemia - no chest pain   Diabetes type 2 : uncontrolled -Continue Lantus 40 units BID, continue NovoLog TID   Thrombocytopenia/Anemia - appears chronic with baseline 90,000 range (at baseline), currently improved - anemia panel unremarkable  HTN-  -cont Lisinopril 10 mg daily,   Day of Discharge BP 138/75 mmHg  Pulse 91  Temp(Src) 97.8 F (36.6 C) (Oral)  Resp 16  Ht 5\' 3"  (1.6 m)  Wt 94.5 kg (208 lb 5.4 oz)  BMI 36.91 kg/m2  SpO2 97%  Physical Exam: General: Alert and awake oriented x3 not in any acute distress. CVS: S1-S2 clear no murmur rubs or gallops Chest: clear to auscultation bilaterally, no wheezing rales or rhonchi Abdomen: soft nontender, nondistended, normal bowel sounds Extremities: no cyanosis, clubbing or edema noted bilaterally Neuro: Cranial nerves II-XII intact, no focal neurological deficits   The  results of significant diagnostics from this hospitalization (including imaging, microbiology, ancillary and laboratory) are listed below for reference.  LAB RESULTS: Basic Metabolic Panel:  Recent Labs Lab 04/24/14 0355  04/26/14 0638 04/29/14 0710  NA 137  < > 136 136  K 4.4  < > 4.8 4.8  CL 99  < > 102 100  CO2 31  < > 31 29  GLUCOSE 89  < > 141* 181*  BUN 33*  < > 42* 38*  CREATININE 0.89  < > 1.10 0.92  CALCIUM 8.6  < > 8.7 8.9  MG 2.0  --   --   --   < > = values in this interval not displayed. Liver Function Tests:  Recent Labs Lab 04/24/14 0355  AST 20  ALT 45*  ALKPHOS 76  BILITOT 0.6  PROT 5.6*  ALBUMIN 2.9*   No results for input(s): LIPASE, AMYLASE in the last 168 hours. No results for input(s): AMMONIA in the last 168 hours. CBC:  Recent Labs Lab 04/24/14 0355  04/26/14 0638 04/28/14 1008 04/29/14 0710  WBC 12.0*  < > 9.2  --  7.8  NEUTROABS 7.7  --   --   --   --   HGB 10.0*  < > 9.0* 9.8* 9.1*  HCT 32.0*  < > 29.5* 31.6* 29.4*  MCV 84.2  < > 83.8  --  81.9  PLT 140*  < > 133*  --  124*  < > = values in this interval not displayed. Cardiac Enzymes: No results for input(s): CKTOTAL, CKMB, CKMBINDEX, TROPONINI in the last 168 hours. BNP: Invalid input(s): POCBNP CBG:  Recent Labs Lab 04/28/14 2201 04/29/14 0804  GLUCAP 173* 160*    Significant Diagnostic Studies:  Dg Chest 2 View  04/16/2014   CLINICAL DATA:  Productive cough for 2-3 days.  Initial encounter.  EXAM: CHEST  2 VIEW  COMPARISON:  Chest radiograph performed earlier today at 9:07 p.m.  FINDINGS: There is elevation of the right hemidiaphragm. Vascular congestion is noted, with increased interstitial markings, raising concern for mild pulmonary edema, though pneumonia might have a similar appearance. A small right pleural effusion is suspected. No pneumothorax is seen.  The heart is borderline enlarged. No acute osseous abnormalities are seen.  IMPRESSION: Elevation of the right  hemidiaphragm. Vascular congestion and borderline cardiomegaly, with increased interstitial markings, raising concern for mild pulmonary edema, though pneumonia might have a similar appearance. Suspect small right pleural effusion.   Electronically Signed   By: Garald Balding M.D.   On: 04/16/2014 23:52   Ct Angio Chest Pe W/cm &/or Wo Cm  04/17/2014   CLINICAL DATA:  Shortness of breath  EXAM: CT ANGIOGRAPHY CHEST WITH CONTRAST  TECHNIQUE: Multidetector CT imaging of the chest was performed using the standard protocol during bolus administration of intravenous contrast. Multiplanar CT image reconstructions and MIPs were obtained to evaluate the vascular anatomy.  CONTRAST:  146mL OMNIPAQUE IOHEXOL 350 MG/ML SOLN  COMPARISON:  Plain film from previous day  FINDINGS: Left lung is well aerated with some mild dependent atelectatic changes. The right lung demonstrates patchy infiltrate in the lower lobe and to a lesser degree in the upper lobe similar to that seen on prior plain film examination.  The thoracic inlet again demonstrates a prominent right thyroid nodule which has been previously biopsied earlier this year. The thoracic aorta shows diffuse calcifications without aneurysmal dilatation or dissection. The aortic branches are within normal limits. Mild coronary calcification is seen.  The pulmonary artery shows a normal branching pattern without evidence of intraluminal filling defect. Some hilar adenopathy is  noted bilaterally likely reactive related to the infiltrative change in the right lower lobe. Visualized upper abdomen reveals vicarious excretion of contrast within the gallbladder. Hypodense lesion within the right adrenal gland is noted likely representing an adenoma. The bony structures show no acute abnormality.  Review of the MIP images confirms the above findings.  IMPRESSION: No evidence of pulmonary emboli.  Right lower and mild right upper lobe infiltrate with associated adenopathy likely  reactive in nature. Followup following appropriate therapy is recommended to rule out persistent abnormality.  Right adrenal lesion likely representing an adenoma.  No other focal abnormality is seen.   Electronically Signed   By: Inez Catalina M.D.   On: 04/17/2014 17:10   Dg Chest Port 1 View  04/16/2014   CLINICAL DATA:  Acute onset of shortness of breath. Initial encounter.  EXAM: PORTABLE CHEST - 1 VIEW  COMPARISON:  Chest radiograph from 02/11/2014  FINDINGS: The lungs are hypoexpanded. There is elevation of the right hemidiaphragm. Vascular congestion is noted. Increased interstitial markings could reflect mild interstitial edema. No pleural effusion or pneumothorax is seen.  The cardiomediastinal silhouette is enlarged. No acute osseous abnormalities are identified.  IMPRESSION: Lungs hypoexpanded and difficult to fully assess, with chronic elevation of the right hemidiaphragm. Vascular congestion and cardiomegaly noted. Increased interstitial markings could reflect mild interstitial edema.   Electronically Signed   By: Garald Balding M.D.   On: 04/16/2014 21:56    2D ECHO: Study Conclusions  - Left ventricle: E/e&'>14.8 suggestive of elevated LV filling pressures. There was mild concentric hypertrophy. Systolic function was normal. The estimated ejection fraction was in the range of 60% to 65%. There was an increased relative contribution of atrial contraction to ventricular filling. Doppler parameters are consistent with abnormal left ventricular relaxation (grade 1 diastolic dysfunction). - Aortic valve: Mildly calcified annulus. Mildly calcified leaflets. - Mitral valve: Calcified annulus. There was mild regurgitation. - Left atrium: The atrium was mildly to moderately dilated.  Disposition and Follow-up: Discharge Instructions    Diet Carb Modified    Complete by:  As directed      Increase activity slowly    Complete by:  As directed              DISPOSITION: Skilled nursing facility   DISCHARGE FOLLOW-UP Follow-up Information    Follow up with MCGOWEN,PHILIP H, MD. Schedule an appointment as soon as possible for a visit in 2 weeks.   Specialty:  Family Medicine   Why:  for hospital follow-up   Contact information:   [address] [place] [address] [phone]       Follow up with Alta Corning, MD On 05/04/2014.   Specialty:  Orthopedic Surgery   Why:  for hospital follow-up   Contact information:   [place]  [address] [phone]        Time spent on Discharge: 40 minutes  Signed:   RAI,RIPUDEEP M.D. [facility] 04/29/2014, 11:37 AM Pager: [phone]

## 2014-05-12 ENCOUNTER — Ambulatory Visit: Payer: Medicare Other | Admitting: Family Medicine

## 2014-05-24 HISTORY — PX: BIOPSY THYROID: PRO38

## 2014-06-08 ENCOUNTER — Telehealth: Payer: Self-pay | Admitting: Family Medicine

## 2014-06-08 DIAGNOSIS — H269 Unspecified cataract: Secondary | ICD-10-CM

## 2014-06-08 MED ORDER — OXYCODONE HCL 5 MG PO TABS: 5.0000 mg | ORAL_TABLET | Freq: Four times a day (QID) | ORAL | Status: DC | PRN

## 2014-06-08 MED ORDER — MECLIZINE HCL 12.5 MG PO TABS: ORAL_TABLET | ORAL | Status: DC

## 2014-06-08 NOTE — Telephone Encounter (Signed)
Patient is requesting a referral to [facility] [phone]. She needs an appointment after 07/06/14

## 2014-06-08 NOTE — Telephone Encounter (Signed)
Please advise 

## 2014-06-08 NOTE — Telephone Encounter (Signed)
OK: oxycodone 5mg  tabs rx'd, 1-2 every 6 hours as needed for pain, #60--needs to be picked up here. Meclizine rx sent to her pharmacy.

## 2014-06-08 NOTE — Telephone Encounter (Signed)
Referral to [facility] ordered as per pt request.

## 2014-06-08 NOTE — Telephone Encounter (Signed)
Pt aware.  Pt husband to p/u oxycodone.

## 2014-06-08 NOTE — Telephone Encounter (Signed)
Pt LMOM stating she was just discharged from rehab for broken ankle.  Pt was given hydrocodone and she stated that over the weekend she became very ill from it.  Her rehab center called in meclizine for nausea and it helped but she is down to two pills.  Can you Rx this for her.  She also requested Rx for percocet.  Please advise.

## 2014-06-10 ENCOUNTER — Emergency Department (HOSPITAL_COMMUNITY)
Admission: EM | Admit: 2014-06-10 | Discharge: 2014-06-11 | Disposition: A | Discharge status: Home / Self Care | Payer: Medicare Other | Attending: Emergency Medicine | Admitting: Emergency Medicine

## 2014-06-10 ENCOUNTER — Emergency Department (HOSPITAL_COMMUNITY): Payer: Medicare Other

## 2014-06-10 ENCOUNTER — Encounter (HOSPITAL_COMMUNITY): Payer: Self-pay | Admitting: *Deleted

## 2014-06-10 ENCOUNTER — Ambulatory Visit: Payer: Medicare Other | Admitting: Family Medicine

## 2014-06-10 DIAGNOSIS — M1990 Unspecified osteoarthritis, unspecified site: Secondary | ICD-10-CM | POA: Diagnosis not present

## 2014-06-10 DIAGNOSIS — Z862 Personal history of diseases of the blood and blood-forming organs and certain disorders involving the immune mechanism: Secondary | ICD-10-CM | POA: Diagnosis not present

## 2014-06-10 DIAGNOSIS — Z794 Long term (current) use of insulin: Secondary | ICD-10-CM | POA: Insufficient documentation

## 2014-06-10 DIAGNOSIS — Z8781 Personal history of (healed) traumatic fracture: Secondary | ICD-10-CM | POA: Diagnosis not present

## 2014-06-10 DIAGNOSIS — Z91040 Latex allergy status: Secondary | ICD-10-CM | POA: Insufficient documentation

## 2014-06-10 DIAGNOSIS — G43909 Migraine, unspecified, not intractable, without status migrainosus: Secondary | ICD-10-CM | POA: Insufficient documentation

## 2014-06-10 DIAGNOSIS — F329 Major depressive disorder, single episode, unspecified: Secondary | ICD-10-CM | POA: Diagnosis not present

## 2014-06-10 DIAGNOSIS — J441 Chronic obstructive pulmonary disease with (acute) exacerbation: Secondary | ICD-10-CM | POA: Diagnosis not present

## 2014-06-10 DIAGNOSIS — E042 Nontoxic multinodular goiter: Secondary | ICD-10-CM | POA: Diagnosis not present

## 2014-06-10 DIAGNOSIS — M797 Fibromyalgia: Secondary | ICD-10-CM | POA: Insufficient documentation

## 2014-06-10 DIAGNOSIS — Z86018 Personal history of other benign neoplasm: Secondary | ICD-10-CM | POA: Diagnosis not present

## 2014-06-10 DIAGNOSIS — R600 Localized edema: Secondary | ICD-10-CM | POA: Diagnosis not present

## 2014-06-10 DIAGNOSIS — Z7952 Long term (current) use of systemic steroids: Secondary | ICD-10-CM | POA: Insufficient documentation

## 2014-06-10 DIAGNOSIS — F419 Anxiety disorder, unspecified: Secondary | ICD-10-CM | POA: Diagnosis not present

## 2014-06-10 DIAGNOSIS — Z9981 Dependence on supplemental oxygen: Secondary | ICD-10-CM | POA: Insufficient documentation

## 2014-06-10 DIAGNOSIS — K219 Gastro-esophageal reflux disease without esophagitis: Secondary | ICD-10-CM | POA: Insufficient documentation

## 2014-06-10 DIAGNOSIS — E039 Hypothyroidism, unspecified: Secondary | ICD-10-CM | POA: Diagnosis not present

## 2014-06-10 DIAGNOSIS — E669 Obesity, unspecified: Secondary | ICD-10-CM | POA: Diagnosis not present

## 2014-06-10 DIAGNOSIS — E785 Hyperlipidemia, unspecified: Secondary | ICD-10-CM | POA: Insufficient documentation

## 2014-06-10 DIAGNOSIS — K589 Irritable bowel syndrome without diarrhea: Secondary | ICD-10-CM | POA: Diagnosis not present

## 2014-06-10 DIAGNOSIS — R2243 Localized swelling, mass and lump, lower limb, bilateral: Secondary | ICD-10-CM | POA: Diagnosis present

## 2014-06-10 DIAGNOSIS — Z79899 Other long term (current) drug therapy: Secondary | ICD-10-CM | POA: Insufficient documentation

## 2014-06-10 DIAGNOSIS — Z87891 Personal history of nicotine dependence: Secondary | ICD-10-CM | POA: Diagnosis not present

## 2014-06-10 DIAGNOSIS — Z8673 Personal history of transient ischemic attack (TIA), and cerebral infarction without residual deficits: Secondary | ICD-10-CM | POA: Insufficient documentation

## 2014-06-10 DIAGNOSIS — Z853 Personal history of malignant neoplasm of breast: Secondary | ICD-10-CM | POA: Insufficient documentation

## 2014-06-10 DIAGNOSIS — Z880 Allergy status to penicillin: Secondary | ICD-10-CM | POA: Insufficient documentation

## 2014-06-10 DIAGNOSIS — G8929 Other chronic pain: Secondary | ICD-10-CM | POA: Diagnosis not present

## 2014-06-10 LAB — CBC
HEMATOCRIT: 30.8 % — AB (ref 36.0–46.0)
Hemoglobin: 10.1 g/dL — ABNORMAL LOW (ref 12.0–15.0)
MCH: 26.4 pg (ref 26.0–34.0)
MCHC: 32.8 g/dL (ref 30.0–36.0)
MCV: 80.4 fL (ref 78.0–100.0)
Platelets: 120 10*3/uL — ABNORMAL LOW (ref 150–400)
RBC: 3.83 MIL/uL — AB (ref 3.87–5.11)
RDW: 13.8 % (ref 11.5–15.5)
WBC: 6.9 10*3/uL (ref 4.0–10.5)

## 2014-06-10 LAB — BRAIN NATRIURETIC PEPTIDE: B Natriuretic Peptide: 69.6 pg/mL (ref 0.0–100.0)

## 2014-06-10 LAB — COMPREHENSIVE METABOLIC PANEL
ALT: 12 U/L (ref 0–35)
AST: 15 U/L (ref 0–37)
Albumin: 3.3 g/dL — ABNORMAL LOW (ref 3.5–5.2)
Alkaline Phosphatase: 91 U/L (ref 39–117)
Anion gap: 5 (ref 5–15)
BUN: 18 mg/dL (ref 6–23)
CHLORIDE: 105 mmol/L (ref 96–112)
CO2: 29 mmol/L (ref 19–32)
Calcium: 8.8 mg/dL (ref 8.4–10.5)
Creatinine, Ser: 0.66 mg/dL (ref 0.50–1.10)
GFR calc Af Amer: >90 mL/min (ref >90)
GFR calc non Af Amer: 88 mL/min — ABNORMAL LOW (ref >90)
Glucose, Bld: 119 mg/dL — ABNORMAL HIGH (ref 70–99)
POTASSIUM: 3.7 mmol/L (ref 3.5–5.1)
Sodium: 139 mmol/L (ref 135–145)
TOTAL PROTEIN: 6 g/dL (ref 6.0–8.3)
Total Bilirubin: 0.6 mg/dL (ref 0.3–1.2)

## 2014-06-10 MED ORDER — FUROSEMIDE 40 MG PO TABS
40.0000 mg | ORAL_TABLET | Freq: Two times a day (BID) | ORAL | Status: DC
Start: 2014-06-10 — End: 2014-07-06

## 2014-06-10 MED ORDER — OXYCODONE HCL 5 MG PO TABS
10.0000 mg | ORAL_TABLET | Freq: Once | ORAL | Status: AC
Start: 2014-06-10 — End: 2014-06-10
  Administered 2014-06-10: 10 mg via ORAL
  Filled 2014-06-10: qty 2

## 2014-06-10 NOTE — ED Notes (Addendum)
Pt c/o increased bil LE edema x 1 week, since she's been discharged from rehab for R ankle surgery.  Denies sob or chest pain.  Pt called her MD and told her to come to ED.

## 2014-06-10 NOTE — Discharge Instructions (Signed)
Peripheral Edema °You have swelling in your legs (peripheral edema). This swelling is due to excess accumulation of salt and water in your body. Edema may be a sign of heart, kidney or liver disease, or a side effect of a medication. It may also be due to problems in the leg veins. Elevating your legs and using special support stockings may be very helpful, if the cause of the swelling is due to poor venous circulation. Avoid long periods of standing, whatever the cause. °Treatment of edema depends on identifying the cause. Chips, pretzels, pickles and other salty foods should be avoided. Restricting salt in your diet is almost always needed. Water pills (diuretics) are often used to remove the excess salt and water from your body via urine. These medicines prevent the kidney from reabsorbing sodium. This increases urine flow. °Diuretic treatment may also result in lowering of potassium levels in your body. Potassium supplements may be needed if you have to use diuretics daily. Daily weights can help you keep track of your progress in clearing your edema. You should call your caregiver for follow up care as recommended. °SEEK IMMEDIATE MEDICAL CARE IF:  °· You have increased swelling, pain, redness, or heat in your legs. °· You develop shortness of breath, especially when lying down. °· You develop chest or abdominal pain, weakness, or fainting. °· You have a fever. °Document Released: 05/17/2004 Document Revised: 07/02/2011 Document Reviewed: 04/27/2009 °[employer]® Patient Information ©2015 [employer]. This information is not intended to replace advice given to you by your health care provider. Make sure you discuss any questions you have with your health care provider. ° °

## 2014-06-10 NOTE — ED Notes (Signed)
Notified [facility] for transportation back home 

## 2014-06-10 NOTE — ED Notes (Signed)
Dr. Wentz at bedside. 

## 2014-06-10 NOTE — ED Provider Notes (Signed)
CSN: [id number]     Arrival date & time 06/10/14  1832 History   First MD Initiated Contact with Patient 06/10/14 2052     Chief Complaint  Patient presents with  . Leg Swelling   Brooke Mccullough is a 69 y.o. female with a history of COPD, diabetes and right ankle surgery who presents to emergency complaining of worsening chronic bilateral lower extremity edema for the past week. Patient has chronic bilateral lower extremity edema reports this is worsened over the past week. Patient was just discharged from rehabilitation for her right ankle surgery about a week ago and reports increased edema that goes up to her groin and pain since then. Patient reports she takes Lasix 40 mg once a day. And was previously taking lasix 40 mg twice a day.  Patient reports her pain is 9 out of 10 and worse with walking. She reports difficulty walking due to this edema. She also reports onset of some slight shortness of breath since this afternoon. She wears a walking boot while walking and which she is wearing prior to my evaluation. The patient denies fevers, chills, chest pain, palpitations, cough, abdominal pain, or recent trauma. The patient denies personal or family history of DVTs or PEs.  (Consider location/radiation/quality/duration/timing/severity/associated sxs/prior Treatment) HPI  Past Medical History  Diagnosis Date  . COPD (chronic obstructive pulmonary disease)     nocturnal oxygen, plus daytime use with activity  . Fibromyalgia     Dx'd 1993.  This has impaired her significantly.  . IBS (irritable bowel syndrome)   . Edema   . Gallstones     asymptomatic per pt (as of 09/30/2013); mild chronic elevation of Alk phos (remainder of hepatic panel wnl).  . Hernia of abdominal wall   . Hypertension   . Hyperlipidemia   . On home oxygen therapy     "3.5L @ night and during day prn" (09/28/2013)  . History of pneumonia     "once"  . Adrenal benign tumor   . Type II diabetes mellitus     hx of  good control per pt report + HbA1c <7% June 2015.  Marland Kitchen Anemia 2015    Baseline Hb 11, normocytic.  Marland Kitchen GERD (gastroesophageal reflux disease)   . History of duodenal ulcer   . Daily headache   . Migraine     "@ least monthly" (09/28/2013)  . TIA (transient ischemic attack) 2012; 2014    left facial and left arm numbness  . Degenerative disc disease     Cervical and lumbar  . Osteoarthritis (arthritis due to wear and tear of joints)     "back; knees; elbows; left ankle" (09/28/2013)  . Chronic lower back pain     "L5-S1" (09/28/2013)  . Gout   . Anxiety   . Depression   . Breast cancer 2008    "right".  Lumpectomy + radiation+aromatase inhibitor (femara).  Regular f/u and mammos normal.  . Tremor     "upper extremities; last 6-7 months" (09/28/2013)  . Unspecified hypothyroidism 12/21/2013  . Multinodular goiter summer 2015    FNA 43/1/54 "Follicular lesion of undetermined significance": a follicular lesion/neoplasm can not be ruled out--Dr. Cruzita Lederer recommended repeat bx in 6-12 mo  . Closed right trimalleolar fracture 01/2014    Surgery   Past Surgical History  Procedure Laterality Date  . Lumbar laminectomy  1991; 1996  . Ankle fracture surgery Left 08/2011    Bimalleolar fx  . Total abdominal hysterectomy w/ bilateral salpingoophorectomy  1990  .  Breast biopsy Right 2008  . Breast lumpectomy Right 2008  . Cataract extraction w/ intraocular lens implant Right 08/2013  . Cardiac catheterization  2000's X 2  . Bladder tack    . Orif ankle fracture Right 02/11/2014    Procedure: OPEN REDUCTION INTERNAL FIXATION (ORIF) ANKLE FRACTURE;  Surgeon: Alta Corning, MD;  Location[facility];  Service: Orthopedics;  Laterality: Right;   Family History  Problem Relation Age of Onset  . Arthritis Mother   . Breast cancer Mother   . Hyperlipidemia Mother   . Stroke Mother   . Hypertension Mother   . Mental illness Mother   . Arthritis Father   . Cancer Father   . Hyperlipidemia Father   . Heart  disease Father   . Heart disease Maternal Grandmother   . Hyperlipidemia Maternal Grandmother   . Heart disease Paternal Grandmother   . Hypertension Paternal Grandmother   . Diabetes Paternal Grandmother   . Arthritis Paternal Grandfather    History  Substance Use Topics  . Smoking status: Former Smoker -- 1.00 packs/day for 36 years    Types: Cigarettes    Quit date: 09/09/2013  . Smokeless tobacco: Never Used  . Alcohol Use: No   OB History    No data available     Review of Systems  Constitutional: Negative for fever and chills.  HENT: Negative for congestion and sore throat.   Eyes: Negative for visual disturbance.  Respiratory: Positive for shortness of breath. Negative for cough and wheezing.   Cardiovascular: Positive for leg swelling. Negative for chest pain and palpitations.  Gastrointestinal: Negative for nausea, vomiting, abdominal pain and diarrhea.  Genitourinary: Negative for dysuria.  Musculoskeletal: Negative for back pain and neck pain.       Bilateral leg pain  Skin: Negative for rash.  Neurological: Negative for numbness and headaches.      Allergies  Ciprofloxacin; Latex; Clindamycin/lincomycin; Fentanyl; Indocin; Keflex; Penicillins; and Vancomycin  Home Medications   Prior to Admission medications   Medication Sig Start Date End Date Taking? Authorizing Provider  cetirizine (ZYRTEC) 10 MG tablet Take 10 mg by mouth daily as needed for allergies.   Yes Historical Provider, MD  cholecalciferol (VITAMIN D) 1000 UNITS tablet Take 1,000 Units by mouth daily.   Yes Historical Provider, MD  citalopram (CELEXA) 40 MG tablet Take 40 mg by mouth daily.   Yes Historical Provider, MD  Insulin Glargine (LANTUS) 100 UNIT/ML Solostar Pen Inject 40 Units into the skin 2 (two) times daily. 40 units BID 11/25/13  Yes Tammi Sou, MD  levothyroxine (SYNTHROID, LEVOTHROID) 100 MCG tablet Take 1 tablet (100 mcg total) by mouth daily before breakfast. 02/09/14  Yes  Tammi Sou, MD  lisinopril (PRINIVIL,ZESTRIL) 10 MG tablet Take 1 tablet (10 mg total) by mouth daily. 11/12/13  Yes Tammi Sou, MD  lubiprostone (AMITIZA) 24 MCG capsule Take 1 capsule (24 mcg total) by mouth 2 (two) times daily with a meal. For constipation 04/29/14  Yes Ripudeep Krystal Eaton, MD  Magnesium Oxide 500 MG (LAX) TABS Take 500 mg by mouth 2 (two) times daily.   Yes Historical Provider, MD  omeprazole (PRILOSEC) 40 MG capsule Take 1 capsule (40 mg total) by mouth 2 (two) times daily. 04/29/14  Yes Ripudeep Krystal Eaton, MD  pregabalin (LYRICA) 150 MG capsule Take 1 capsule (150 mg total) by mouth 2 (two) times daily. 12/01/13  Yes Tammi Sou, MD  rOPINIRole (REQUIP) 2 MG tablet Take  2 mg by mouth at bedtime.   Yes Historical Provider, MD  tiotropium (SPIRIVA) 18 MCG inhalation capsule Place 18 mcg into inhaler and inhale daily.   Yes Historical Provider, MD  albuterol (PROVENTIL HFA;VENTOLIN HFA) 108 (90 BASE) MCG/ACT inhaler Inhale 2 puffs into the lungs every 6 (six) hours as needed for wheezing or shortness of breath. 10/10/13   Domenic Polite, MD  albuterol (PROVENTIL) (2.5 MG/3ML) 0.083% nebulizer solution Take 3 mLs (2.5 mg total) by nebulization every 4 (four) hours as needed for wheezing or shortness of breath. Patient not taking: Reported on 04/16/2014 01/18/14   Tammi Sou, MD  ALPRAZolam Duanne Moron) 0.5 MG tablet Take 1 tablet (0.5 mg total) by mouth 2 (two) times daily as needed for anxiety or sleep. 04/29/14   Ripudeep Krystal Eaton, MD  atorvastatin (LIPITOR) 40 MG tablet Take 1 tablet (40 mg total) by mouth daily. 10/27/13   Tammi Sou, MD  benzonatate (TESSALON) 100 MG capsule Take 1 capsule (100 mg total) by mouth 3 (three) times daily. X 10 days 04/29/14   Ripudeep Krystal Eaton, MD  dextromethorphan-guaiFENesin (TUSSIN DM) 10-100 MG/5ML liquid Take 10 mLs by mouth every 4 (four) hours as needed for cough.    Historical Provider, MD  furosemide (LASIX) 40 MG tablet Take 1 tablet (40 mg  total) by mouth 2 (two) times daily. 06/10/14   Verda Cumins Ethelyn Cerniglia, PA-C  guaiFENesin (MUCINEX) 600 MG 12 hr tablet Take 2 tablets (1,200 mg total) by mouth 2 (two) times daily. 04/29/14   Ripudeep Krystal Eaton, MD  hydrocortisone (ANUSOL-HC) 25 MG suppository Place 1 suppository (25 mg total) rectally 2 (two) times daily. X 2 weeks 04/29/14   Ripudeep Krystal Eaton, MD  insulin aspart (NOVOLOG) 100 UNIT/ML injection Inject 6 Units into the skin 3 (three) times daily before meals. 10/10/13   Domenic Polite, MD  letrozole [facility]) 2.5 MG tablet Take 1 tablet (2.5 mg total) by mouth daily. 11/30/13   Tammi Sou, MD  meclizine (ANTIVERT) 12.5 MG tablet 1-2 tabs po q6h prn nausea and/or dizziness 06/08/14   Tammi Sou, MD  menthol-cetylpyridinium (CEPACOL) 3 MG lozenge Take 1 lozenge (3 mg total) by mouth as needed for sore throat. 04/29/14   Ripudeep Krystal Eaton, MD  morphine (MS CONTIN) 15 MG 12 hr tablet Take 1-2 tablets (15-30 mg total) by mouth 2 (two) times daily. Take 15 mg every morning and 30 mg at bedtime 04/29/14   Ripudeep K Rai, MD  oxyCODONE (OXY IR/ROXICODONE) 5 MG immediate release tablet Take 1-2 tablets (5-10 mg total) by mouth every 6 (six) hours as needed for moderate pain. 06/08/14   Tammi Sou, MD  polyethylene glycol (MIRALAX / GLYCOLAX) packet Take 17 g by mouth daily. 04/29/14   Ripudeep K Rai, MD   BP 160/56 mmHg  Pulse 62  Temp(Src) 98 F (36.7 C) (Oral)  Resp 19  SpO2 100% Physical Exam  Constitutional: She is oriented to person, place, and time. She appears well-developed and well-nourished. No distress.  Obese female. Non toxic appearing.   HENT:  Head: Normocephalic and atraumatic.  Mouth/Throat: Oropharynx is clear and moist. No oropharyngeal exudate.  Eyes: Conjunctivae are normal. Pupils are equal, round, and reactive to light. Right eye exhibits no discharge. Left eye exhibits no discharge.  Neck: Neck supple. No JVD present.  Cardiovascular: Normal rate, regular rhythm,  normal heart sounds and intact distal pulses.  Exam reveals no gallop and no friction rub.   No  murmur heard. Bilateral radial, posterior tibialis and dorsalis pedis pulses are intact.    Pulmonary/Chest: Effort normal and breath sounds normal. No respiratory distress. She has no wheezes. She has no rales.  Abdominal: Soft. Bowel sounds are normal. She exhibits no distension. There is no tenderness.  Musculoskeletal: She exhibits edema and tenderness.  Bilateral lower extremities have 2+ pitting edema and are tender to palpation.  Patient is wearing an ace wrap and walking boot on right leg prior to exam.   Lymphadenopathy:    She has no cervical adenopathy.  Neurological: She is alert and oriented to person, place, and time. Coordination normal.  Skin: Skin is warm and dry. No rash noted. She is not diaphoretic. No erythema. No pallor.  Psychiatric: She has a normal mood and affect. Her behavior is normal.  Nursing note and vitals reviewed.   ED Course  Procedures (including critical care time) Labs Review Labs Reviewed  CBC - Abnormal; Notable for the following:    RBC 3.83 (*)    Hemoglobin 10.1 (*)    HCT 30.8 (*)    Platelets 120 (*)    All other components within normal limits  COMPREHENSIVE METABOLIC PANEL - Abnormal; Notable for the following:    Glucose, Bld 119 (*)    Albumin 3.3 (*)    GFR calc non Af Amer 88 (*)    All other components within normal limits  BRAIN NATRIURETIC PEPTIDE    Imaging Review Dg Chest 2 View  06/10/2014   CLINICAL DATA:  Pt states that she is having bi lateral leg swelling and SOB for the past week now  EXAM: CHEST  2 VIEW  COMPARISON:  04/24/2014  FINDINGS: Cardiac silhouette mildly enlarged. No mediastinal or hilar masses or convincing adenopathy.  Elevated right hemidiaphragm, stable. There is opacity above the elevated right hemidiaphragm most consistent with atelectasis. No lung consolidation or edema. No pleural effusion or pneumothorax.   Bony thorax is demineralized but grossly intact.  IMPRESSION: No acute cardiopulmonary disease.   Electronically Signed   By: Lajean Manes M.D.   On: 06/10/2014 19:57     EKG Interpretation   Date/Time:  Thursday June 10 2014 19:08:38 EST Ventricular Rate:  72 PR Interval:  198 QRS Duration: 88 QT Interval:  394 QTC Calculation: 431 R Axis:   21 Text Interpretation:  Normal sinus rhythm with sinus arrhythmia Normal ECG  since last tracing no significant change Confirmed by WENTZ  MD, ELLIOTT  [id number]) on 06/10/2014 9:25:29 PM      Filed Vitals:   06/10/14 2247 06/10/14 2300 06/10/14 2330 06/11/14 0010  BP: 157/52 157/55 157/51 160/56  Pulse: 67 64 67 62  Temp:      TempSrc:      Resp: 14 7 16 19   SpO2: 100% 99% 100% 100%     MDM   Meds given in ED:  Medications  oxyCODONE (Oxy IR/ROXICODONE) immediate release tablet 10 mg (10 mg Oral Given 06/10/14 2354)    New Prescriptions   No medications on file    Final diagnoses:  Bilateral lower extremity edema    Brooke Mccullough is a 69 y.o. female with a history of COPD, diabetes and right ankle surgery who presents to emergency complaining of worsening chronic bilateral lower extremity edema for the past week. She has previously been on lasix 40 mg twice a day, but this has been decreased to once a day and she has had worsening lower extremity edema since. She also  was recently released from rehab and has not been elevating her feet as much and has been walking more. I believe her increased chronic LE edema is worsened for these multiple reasons. Her edema is bilateral and I doubt DVT. CMP and CBC are unremarkable. Patient's BNP is 69.6. Chest x-ray is negative.  Her bilateral DP and PT pulses are intact. Will increase her lasix to twice a day as she was on before and have her follow up with her PCP. I advised the patient to follow-up with their primary care provider this week. I advised the patient to return to the  emergency department with new or worsening symptoms or new concerns. The patient verbalized understanding and agreement with plan.   This patient was discussed with and evaluated by Dr. Eulis Foster who agrees with assessment and plan.     Hanley Hays, PA-C 06/11/14 [id number]  Richarda Blade, MD 06/11/14 [phone]

## 2014-06-15 ENCOUNTER — Other Ambulatory Visit: Payer: Self-pay | Admitting: Family Medicine

## 2014-06-17 ENCOUNTER — Encounter: Payer: Self-pay | Admitting: Family Medicine

## 2014-06-17 ENCOUNTER — Ambulatory Visit (INDEPENDENT_AMBULATORY_CARE_PROVIDER_SITE_OTHER): Payer: Medicare Other | Admitting: Family Medicine

## 2014-06-17 VITALS — BP 120/69 | HR 92 | Temp 98.6°F | Resp 18 | Ht 64.5 in | Wt 230.0 lb

## 2014-06-17 DIAGNOSIS — I5032 Chronic diastolic (congestive) heart failure: Secondary | ICD-10-CM | POA: Diagnosis not present

## 2014-06-17 DIAGNOSIS — E119 Type 2 diabetes mellitus without complications: Secondary | ICD-10-CM

## 2014-06-17 DIAGNOSIS — Z7409 Other reduced mobility: Secondary | ICD-10-CM

## 2014-06-17 DIAGNOSIS — G894 Chronic pain syndrome: Secondary | ICD-10-CM

## 2014-06-17 DIAGNOSIS — IMO0001 Reserved for inherently not codable concepts without codable children: Secondary | ICD-10-CM

## 2014-06-17 DIAGNOSIS — Z8673 Personal history of transient ischemic attack (TIA), and cerebral infarction without residual deficits: Secondary | ICD-10-CM | POA: Diagnosis not present

## 2014-06-17 DIAGNOSIS — I872 Venous insufficiency (chronic) (peripheral): Secondary | ICD-10-CM

## 2014-06-17 DIAGNOSIS — R809 Proteinuria, unspecified: Secondary | ICD-10-CM

## 2014-06-17 LAB — BASIC METABOLIC PANEL
BUN: 31 mg/dL — AB (ref 6–23)
CHLORIDE: 100 meq/L (ref 96–112)
CO2: 33 meq/L — AB (ref 19–32)
CREATININE: 0.83 mg/dL (ref 0.40–1.20)
Calcium: 8.7 mg/dL (ref 8.4–10.5)
GFR: 72.24 mL/min (ref >60.00)
GLUCOSE: 265 mg/dL — AB (ref 70–99)
POTASSIUM: 3.8 meq/L (ref 3.5–5.1)
Sodium: 139 mEq/L (ref 135–145)

## 2014-06-17 MED ORDER — INSULIN ASPART 100 UNIT/ML ~~LOC~~ SOLN: SUBCUTANEOUS | Status: DC

## 2014-06-17 MED ORDER — CLOPIDOGREL BISULFATE 75 MG PO TABS: 75.0000 mg | ORAL_TABLET | Freq: Every day | ORAL | Status: AC

## 2014-06-17 MED ORDER — OXYCODONE HCL 5 MG PO TABS: 5.0000 mg | ORAL_TABLET | Freq: Four times a day (QID) | ORAL | Status: DC | PRN

## 2014-06-17 NOTE — Progress Notes (Signed)
Pre visit review using our clinic review tool, if applicable. No additional management support is needed unless otherwise documented below in the visit note. 

## 2014-06-17 NOTE — Progress Notes (Addendum)
OFFICE VISIT  06/20/2014   CC:  Chief Complaint  Patient presents with  . Follow-up   HPI:    Patient is a 69 y.o. Caucasian female who presents for 5 mo f/u: since I last saw her she fractured her right ankle and had to have ORIF and a long rehab stay--was d/c'd home 06/04/14. Chronic illnesses: Primary DM 2, COPD, HTN, hyperlip, CRI chronic pain syndrome (fibromyalgia, osteoarth of back, neck, knees)--many others+ polypharmacy.  Latest event was ED visit 06/10/14 for increasing LE edema.  Electrolytes and CXR were normal.  CBC stable. Her lasix was increased to 30m bid and she was d/c'd home.  She feels like her LE swelling is improved but still significant. Breathing feels fine as long as she leaves her oxygen on 3.5L/min.  Insulin dosing : lantus 40 U bid.  Novolog 3 U tid with meals. Says most of her glucoses are just under 100.  No hypoglycemia.  Sees ortho surgeon again 06/22/14. Saw Dr. GSelinda Orionin pain mgmt and was pleased but missed f/u 02/2014 due to her fracture/hospitalization. Vicodin from ortho made her sick so I rx'd oxycodone 5105m #60 about 9d/a and she needs RF of this. She says she plans on f/u with Dr. GaSelinda Orionoon but she did NOT sign a pain contract with her at the visit she had last fall.  Pt has longstanding impaired mobility and this is gradually worsening, particularly with regard to latest injury and surgery.  She used to ambulate relatively safely with a rolling walker, now must be push in a wheelchair. Patient's mobility limitations impair her ability to do several mobility-related ADL's in customary locations in her home.  Patient cannot use a cane, crutches, or walker to do these things due to her generalized muscle weakness and chronic deconditioning.  Patient says she can safely self-propel a lightweight wheelchair in her home AND has a caregiver that can provide assistance.  However, patient cannot push themselves in a heavier, standard weight WC in the  home.   Past Medical History  Diagnosis Date  . COPD (chronic obstructive pulmonary disease)     nocturnal oxygen, plus daytime use with activity  . Fibromyalgia     Dx'd 1993.  This has impaired her significantly.  . IBS (irritable bowel syndrome)   . Edema   . Gallstones     asymptomatic per pt (as of 09/30/2013); mild chronic elevation of Alk phos (remainder of hepatic panel wnl).  . Hernia of abdominal wall   . Hypertension   . Hyperlipidemia   . On home oxygen therapy     "3.5L @ night and during day prn" (09/28/2013)  . History of pneumonia     "once"  . Adrenal benign tumor   . Type II diabetes mellitus     hx of good control per pt report + HbA1c <7% June 2015.  . Marland Kitchennemia 2015    Baseline Hb 11, normocytic.  . Marland KitchenERD (gastroesophageal reflux disease)   . History of duodenal ulcer   . Daily headache   . Migraine     "@ least monthly" (09/28/2013)  . TIA (transient ischemic attack) 2012; 2014    left facial and left arm numbness  . Degenerative disc disease     Cervical and lumbar  . Osteoarthritis (arthritis due to wear and tear of joints)     "back; knees; elbows; left ankle" (09/28/2013)  . Chronic lower back pain     "L5-S1" (09/28/2013)  . Gout   .  Anxiety   . Depression   . Breast cancer 2008    "right".  Lumpectomy + radiation+aromatase inhibitor (femara).  Regular f/u and mammos normal.  . Tremor     "upper extremities; last 6-7 months" (09/28/2013)  . Unspecified hypothyroidism 12/21/2013  . Multinodular goiter summer 2015    FNA 23/7/62 "Follicular lesion of undetermined significance": a follicular lesion/neoplasm can not be ruled out--Dr. Cruzita Lederer recommended repeat bx in 6-12 mo  . Closed right trimalleolar fracture 01/2014    Surgery    Past Surgical History  Procedure Laterality Date  . Lumbar laminectomy  1991; 1996  . Ankle fracture surgery Left 08/2011    Bimalleolar fx  . Total abdominal hysterectomy w/ bilateral salpingoophorectomy  1990  . Breast  biopsy Right 2008  . Breast lumpectomy Right 2008  . Cataract extraction w/ intraocular lens implant Right 08/2013  . Cardiac catheterization  2000's X 2  . Bladder tack    . Orif ankle fracture Right 02/11/2014    Procedure: OPEN REDUCTION INTERNAL FIXATION (ORIF) ANKLE FRACTURE;  Surgeon: Alta Corning, MD;  Location: [facility];  Service: Orthopedics;  Laterality: Right;  . Transthoracic echocardiogram  03/2014    Mild LVH, EF 60-65%, grade I diast dysfxn.  Valves fine.    Outpatient Prescriptions Prior to Visit  Medication Sig Dispense Refill  . albuterol (PROVENTIL) (2.5 MG/3ML) 0.083% nebulizer solution Take 3 mLs (2.5 mg total) by nebulization every 4 (four) hours as needed for wheezing or shortness of breath. 75 mL 0  . ALPRAZolam (XANAX) 0.5 MG tablet Take 1 tablet (0.5 mg total) by mouth 2 (two) times daily as needed for anxiety or sleep. 30 tablet 0  . atorvastatin (LIPITOR) 40 MG tablet Take 1 tablet (40 mg total) by mouth daily. 30 tablet 3  . cetirizine (ZYRTEC) 10 MG tablet Take 10 mg by mouth daily as needed for allergies.    . cholecalciferol (VITAMIN D) 1000 UNITS tablet Take 1,000 Units by mouth daily.    . citalopram (CELEXA) 40 MG tablet Take 40 mg by mouth daily.    Marland Kitchen dextromethorphan-guaiFENesin (TUSSIN DM) 10-100 MG/5ML liquid Take 10 mLs by mouth every 4 (four) hours as needed for cough.    . furosemide (LASIX) 40 MG tablet Take 1 tablet (40 mg total) by mouth 2 (two) times daily. 60 tablet 0  . Insulin Glargine (LANTUS) 100 UNIT/ML Solostar Pen Inject 40 Units into the skin 2 (two) times daily. 40 units BID    . letrozole (FEMARA) 2.5 MG tablet Take 1 tablet (2.5 mg total) by mouth daily. 90 tablet 1  . levothyroxine (SYNTHROID, LEVOTHROID) 100 MCG tablet Take 1 tablet (100 mcg total) by mouth daily before breakfast. 90 tablet 1  . lisinopril (PRINIVIL,ZESTRIL) 10 MG tablet Take 1 tablet (10 mg total) by mouth daily. 30 tablet 6  . Magnesium Oxide 500 MG (LAX) TABS Take  500 mg by mouth 2 (two) times daily.    . meclizine (ANTIVERT) 12.5 MG tablet 1-2 tabs po q6h prn nausea and/or dizziness 60 tablet 1  . morphine (MS CONTIN) 15 MG 12 hr tablet Take 1-2 tablets (15-30 mg total) by mouth 2 (two) times daily. Take 15 mg every morning and 30 mg at bedtime 30 tablet 0  . omeprazole (PRILOSEC) 40 MG capsule Take 1 capsule (40 mg total) by mouth 2 (two) times daily. 90 capsule 1  . polyethylene glycol (MIRALAX / GLYCOLAX) packet Take 17 g by mouth daily. 14 each 0  .  pregabalin (LYRICA) 150 MG capsule Take 1 capsule (150 mg total) by mouth 2 (two) times daily. 60 capsule 5  . rOPINIRole (REQUIP) 2 MG tablet Take 2 mg by mouth at bedtime.    Marland Kitchen tiotropium (SPIRIVA) 18 MCG inhalation capsule Place 18 mcg into inhaler and inhale daily.    . insulin aspart (NOVOLOG) 100 UNIT/ML injection Inject 6 Units into the skin 3 (three) times daily before meals.    Marland Kitchen oxyCODONE (OXY IR/ROXICODONE) 5 MG immediate release tablet Take 1-2 tablets (5-10 mg total) by mouth every 6 (six) hours as needed for moderate pain. 60 tablet 0  . albuterol (PROVENTIL HFA;VENTOLIN HFA) 108 (90 BASE) MCG/ACT inhaler Inhale 2 puffs into the lungs every 6 (six) hours as needed for wheezing or shortness of breath. (Patient not taking: Reported on 06/17/2014) 1 Inhaler 2  . benzonatate (TESSALON) 100 MG capsule Take 1 capsule (100 mg total) by mouth 3 (three) times daily. X 10 days (Patient not taking: Reported on 06/17/2014) 30 capsule 0  . guaiFENesin (MUCINEX) 600 MG 12 hr tablet Take 2 tablets (1,200 mg total) by mouth 2 (two) times daily.    . hydrocortisone (ANUSOL-HC) 25 MG suppository Place 1 suppository (25 mg total) rectally 2 (two) times daily. X 2 weeks 28 suppository 0  . lubiprostone (AMITIZA) 24 MCG capsule Take 1 capsule (24 mcg total) by mouth 2 (two) times daily with a meal. For constipation 60 capsule 0  . menthol-cetylpyridinium (CEPACOL) 3 MG lozenge Take 1 lozenge (3 mg total) by mouth as  needed for sore throat. 100 tablet 12   No facility-administered medications prior to visit.    Allergies  Allergen Reactions  . Ciprofloxacin Other (See Comments)    Unspecified--noted in old PCP's records.  . Latex Other (See Comments)    Unspecified: noted in old PCP's records.  . Clindamycin/Lincomycin Rash  . Fentanyl Rash    Duragesic Patch  . Indocin [Indomethacin] Rash  . Keflex [Cephalexin] Rash  . Penicillins Rash  . Vancomycin Rash    ROS As per HPI  PE: Blood pressure 120/69, pulse 92, temperature 98.6 F (37 C), temperature source Temporal, resp. rate 18, height 5' 4.5" (1.638 m), weight 230 lb (104.327 kg), SpO2 91 %. 3.5L oxygen via nasal cannulae Gen: Alert, well appearing, sitting in wheelchair wearing nasal cannulae.  Patient is oriented to person, place, time, and situation. YQM:VHQI: no injection, icteris, swelling, or exudate.  EOMI, PERRLA. Mouth: lips without lesion/swelling.  Oral mucosa pink and moist. Oropharynx without erythema, exudate, or swelling.  CV: RRR, distant S1 and S2, no audible murmur, rub, or gallop. LUNGS: distant BS but nonlabored and no adventitious sounds or prolongation of exp phase. EXT: marked swelling of both LL's consistent with lymphedema, and only 1+ pitting edema bilat from knees into feet. Right leg with walking boot not removed today. Arm and leg strength 4/5 bilat/symmetric.   This strength is adequate for light wt wheelchair self-propelling but inadequate for self propelling a heavier, standard wt wheelchair in home.  LABS:  Lab Results  Component Value Date   HGBA1C 5.9* 04/24/2014      Chemistry      Component Value Date/Time   NA 139 06/17/2014 1525   K 3.8 06/17/2014 1525   CL 100 06/17/2014 1525   CO2 33* 06/17/2014 1525   BUN 31* 06/17/2014 1525   CREATININE 0.83 06/17/2014 1525      Component Value Date/Time   CALCIUM 8.7 06/17/2014 1525  ALKPHOS 91 06/10/2014 1907   AST 15 06/10/2014 1907   ALT 12  06/10/2014 1907   BILITOT 0.6 06/10/2014 1907     Lab Results  Component Value Date   WBC 6.9 06/10/2014   HGB 10.1* 06/10/2014   HCT 30.8* 06/10/2014   MCV 80.4 06/10/2014   PLT 120* 06/10/2014   Lab Results  Component Value Date   TSH 0.731 10/07/2013   Lab Results  Component Value Date   CHOL 144 04/24/2014   HDL 33* 04/24/2014   LDLCALC 68 04/24/2014   TRIG 213* 04/24/2014   CHOLHDL 4.4 04/24/2014     IMPRESSION AND PLAN:  1) Chronic diastolic CHF; stable. Continue lisinopril and lasix at current dosing. Pt requests cardiology consult so I referred her to Dr. Stanford Breed who already sees pt's husband.  2) Venous insufficiency of both LE's: improved with recent increase of lasix to 40 mg bid. Recheck lytes/cr today.  3) Chronic pain syndrome: fibromyalgia, osteoarthritis of back, neck, knees. She has established with Dr. [name] in pain mgmt but intercurrent illness has prevented f/u/treatment. She will be getting back in to see her in the near future but does not have a pain contract with her. I agreed to RF pt's oxycodon 26m, 1-2 tid prn, #180.  4) DM 2 with hx of microalbuminuria:   Lab Results  Component Value Date   HGBA1C 5.9* 04/24/2014  Continue current insulin regimen, diet. Next visit will hopefull get to do her foot exam and also get urine microalbumin/cr.  5) Hx of TIA's.  Had been on plavix prior to her recent ankle surgery but it appears it never got restarted after the surgery. Will restart plavix 740mqd today.  6) Right ankle trimalleolar fracture, s/p recent operative repair and rehab stent. Keep approp ortho f/u.  7) Impaired mobility: worsening.  Generalized weakness is present but she does have adequate strength to self propel a light wt wheelchair in her home in order to be able to do mobility-related ADL's.  An After Visit Summary was printed and given to the patient.  FOLLOW UP: Return in about 2 months (around 08/16/2014) for routine  chronic illness f/u (30 min).

## 2014-06-18 ENCOUNTER — Telehealth: Payer: Self-pay | Admitting: Family Medicine

## 2014-06-18 NOTE — Telephone Encounter (Signed)
Noted  

## 2014-06-18 NOTE — Telephone Encounter (Signed)
[facility] called to let us know that on 06/08/2014 they dispensed Meclazine @ 25mg  not the 12.5 mg ordered. They state they talked to pt. And she has had no problems. They wanted Dr. Anitra Lauth to be aware.

## 2014-06-20 ENCOUNTER — Other Ambulatory Visit: Payer: Self-pay | Admitting: Family Medicine

## 2014-06-24 ENCOUNTER — Other Ambulatory Visit: Payer: Self-pay | Admitting: Family Medicine

## 2014-06-28 ENCOUNTER — Telehealth: Payer: Self-pay | Admitting: *Deleted

## 2014-06-28 NOTE — Telephone Encounter (Signed)
Brooke Mccullough from [facility] called for "ok" for pt to have 2 to 3 more weeks of home health aid. Home health nurse also wanted ok to do new A1c. Please advise? Callback info [phone]

## 2014-06-28 NOTE — Telephone Encounter (Signed)
OK for the ongoing home health aid. However, it is too early to repeat HbA1c b/c her most recent one was 04/24/14 (cannot recheck before 07/24/14).-thx

## 2014-06-29 ENCOUNTER — Telehealth: Payer: Self-pay | Admitting: Family Medicine

## 2014-06-29 NOTE — Telephone Encounter (Signed)
Left detailed message on [name]'s cell.

## 2014-06-29 NOTE — Telephone Encounter (Signed)
Please advise 

## 2014-06-29 NOTE — Telephone Encounter (Signed)
Kndra from [facility] called and needs an additional order for Margarite to have a wheelchair with elevating leg rests. They were unaware this was a different order than a standard wheelchair.

## 2014-06-30 ENCOUNTER — Other Ambulatory Visit: Payer: Self-pay | Admitting: Family Medicine

## 2014-06-30 ENCOUNTER — Telehealth: Payer: Self-pay | Admitting: Family Medicine

## 2014-06-30 NOTE — Telephone Encounter (Signed)
Pts. Home health care nurse called and stated that Brooke Mccullough is having increased pain in her left calf and it is sore to the touch. It does not appear to be red, warm or does not have extra swelling. She does however have a rash. If you have any orders please call Tillie Rung at [phone].

## 2014-06-30 NOTE — Telephone Encounter (Signed)
Please advise 

## 2014-07-02 NOTE — Telephone Encounter (Signed)
[facility] nurse called advising patient is still having the calf pain.   Please advise.

## 2014-07-02 NOTE — Telephone Encounter (Signed)
Sounds like I need to examine her leg--o/v.-thx

## 2014-07-02 NOTE — Telephone Encounter (Signed)
LMOM for pt's [facility] PT to notify patient that Dr would like to reexamine her legs.

## 2014-07-06 ENCOUNTER — Other Ambulatory Visit: Payer: Self-pay | Admitting: Family Medicine

## 2014-07-06 ENCOUNTER — Telehealth: Payer: Self-pay | Admitting: Family Medicine

## 2014-07-06 MED ORDER — MORPHINE SULFATE ER 15 MG PO TBCR: 15.0000 mg | EXTENDED_RELEASE_TABLET | Freq: Two times a day (BID) | ORAL | Status: DC

## 2014-07-06 MED ORDER — ALPRAZOLAM 0.5 MG PO TABS: 0.5000 mg | ORAL_TABLET | Freq: Two times a day (BID) | ORAL | Status: DC | PRN

## 2014-07-06 MED ORDER — FUROSEMIDE 40 MG PO TABS: 40.0000 mg | ORAL_TABLET | Freq: Two times a day (BID) | ORAL | Status: DC

## 2014-07-06 NOTE — Telephone Encounter (Signed)
Patient aware.

## 2014-07-06 NOTE — Telephone Encounter (Signed)
I sent in lasix 40mg  bid, #60, RF x 6--this is her current dosing.  I did not increase it to 80 mg bid.  If she is taking 80 mg bid at this time then tell her to decrease dosing back to 40mg  bid.  Also, I see she has been on MS contin in the recent past.  Ask her who rx'd this for her.-thx

## 2014-07-06 NOTE — Telephone Encounter (Signed)
I rx'd #180 oxycodone pills on 06/17/14 and she was instructed to use 1-2 tabs three times a day.  She should NOT be running out of these until about 3/25. Also, ask if she has ever taken MS contin before. Will make the change on her xanax that she requested. I will double check but I do not think I changed her lasix dosing at all.

## 2014-07-06 NOTE — Telephone Encounter (Signed)
OK, MS contin history reviewed in chart/clarified. I did have her on this in the past. Will do one month supply of MS contin 15mg , 1 tab po bid, #60. Pls remind her that starting this while still taking oxycodone will cause more sedation/impairment so she will want to take oxycodone sparingly (only for severe breakthrough pain between MS contin doses)-thx

## 2014-07-06 NOTE — Telephone Encounter (Signed)
Pt called requesting her xanax rx be changed to quantity 60 so she can take it two times daily.  Also she said that you changed the furosemide Rx to 80 mg BID and she's only getting one.    Also, pt requesting MS Contin because she hasn't seen her pain clinic in a few months and she can't get into their office this week and she needs rf by Friday.  Please advise.

## 2014-07-07 ENCOUNTER — Ambulatory Visit: Payer: Medicare Other | Admitting: Cardiology

## 2014-07-07 ENCOUNTER — Telehealth: Payer: Self-pay | Admitting: Family Medicine

## 2014-07-07 MED ORDER — MORPHINE SULFATE ER 15 MG PO TBCR: EXTENDED_RELEASE_TABLET | ORAL | Status: DC

## 2014-07-07 NOTE — Telephone Encounter (Signed)
Pt called and stated that the MS contin should state take 1 tab in the am and 2 QHS.  Can you re-write this Rx??   Also, pt does have an appointment scheduled with her pain clinic but the first available was not until 09/02/14.

## 2014-07-07 NOTE — Telephone Encounter (Signed)
Noted. MS Contin rx with correct instructions/dispensation printed. Pt to bring back MS Contin rx she picked up yesterday and exchange for this one.

## 2014-07-07 NOTE — Telephone Encounter (Signed)
Patient is aware.  She will have her husband p/u new Rx and return old one.

## 2014-07-08 ENCOUNTER — Ambulatory Visit (INDEPENDENT_AMBULATORY_CARE_PROVIDER_SITE_OTHER): Payer: Medicare Other | Admitting: Family Medicine

## 2014-07-08 ENCOUNTER — Encounter: Payer: Self-pay | Admitting: Family Medicine

## 2014-07-08 VITALS — BP 109/63 | HR 91 | Temp 98.4°F | Ht 64.5 in | Wt 236.0 lb

## 2014-07-08 DIAGNOSIS — G8929 Other chronic pain: Secondary | ICD-10-CM | POA: Diagnosis not present

## 2014-07-08 DIAGNOSIS — G2581 Restless legs syndrome: Secondary | ICD-10-CM

## 2014-07-08 DIAGNOSIS — G894 Chronic pain syndrome: Secondary | ICD-10-CM | POA: Diagnosis not present

## 2014-07-08 DIAGNOSIS — G4701 Insomnia due to medical condition: Secondary | ICD-10-CM

## 2014-07-08 DIAGNOSIS — Z8781 Personal history of (healed) traumatic fracture: Secondary | ICD-10-CM

## 2014-07-08 DIAGNOSIS — I872 Venous insufficiency (chronic) (peripheral): Secondary | ICD-10-CM

## 2014-07-08 DIAGNOSIS — M25571 Pain in right ankle and joints of right foot: Secondary | ICD-10-CM

## 2014-07-08 MED ORDER — OXYCODONE HCL 10 MG PO TABS: ORAL_TABLET | ORAL | Status: DC

## 2014-07-08 MED ORDER — ROPINIROLE HCL 2 MG PO TABS: ORAL_TABLET | ORAL | Status: DC

## 2014-07-08 NOTE — Progress Notes (Signed)
OFFICE NOTE  07/08/2014  CC:  Chief Complaint  Patient presents with  . Follow-up   HPI: Patient is a 69 y.o. Caucasian female who is here for ongoing right ankle pain. She is still having quite a bit of right ankle pain.  Doing a bit better regarding PT, able to walk on it with assistance with a rolling walker.  Overall day to day pain level is same as right after surgery about 5 months ago: 9/10 intensity.  Sleep is impaired due to her pain.   She went through rehab for her ankle after surgery and has continued to get home PT.  She says that she goes to f/u ortho visits and x-rays have shown good healing of her fractures and she is frustrated with not getting any explanation as to why she is still having such pain.    She is utilizing leg lift on her WC now and this has helped with her chronic LE venous insufficiency edema. In afternoons she has tingling and increased restlessness in left LL/foot that gets better if she takes a dose of her requip.  Pertinent PMH:  Past medical, surgical, social, and family history reviewed and no changes are noted since last office visit.  MEDS:  Outpatient Prescriptions Prior to Visit  Medication Sig Dispense Refill  . albuterol (PROVENTIL) (2.5 MG/3ML) 0.083% nebulizer solution Take 3 mLs (2.5 mg total) by nebulization every 4 (four) hours as needed for wheezing or shortness of breath. 75 mL 0  . ALPRAZolam (XANAX) 0.5 MG tablet Take 1 tablet (0.5 mg total) by mouth 2 (two) times daily as needed for anxiety or sleep. 60 tablet 5  . atorvastatin (LIPITOR) 40 MG tablet TAKE 1 TABLET BY MOUTH EVERY DAY 30 tablet 0  . cetirizine (ZYRTEC) 10 MG tablet Take 10 mg by mouth daily as needed for allergies.    . cholecalciferol (VITAMIN D) 1000 UNITS tablet Take 1,000 Units by mouth daily.    . citalopram (CELEXA) 40 MG tablet Take 40 mg by mouth daily.    . clopidogrel (PLAVIX) 75 MG tablet Take 1 tablet (75 mg total) by mouth daily. 90 tablet 3  .  dextromethorphan-guaiFENesin (TUSSIN DM) 10-100 MG/5ML liquid Take 10 mLs by mouth every 4 (four) hours as needed for cough.    . furosemide (LASIX) 40 MG tablet Take 1 tablet (40 mg total) by mouth 2 (two) times daily. 60 tablet 6  . insulin aspart (NOVOLOG) 100 UNIT/ML injection 3 U SQ qAC 10 mL   . Insulin Glargine (LANTUS) 100 UNIT/ML Solostar Pen Inject 40 Units into the skin 2 (two) times daily. 40 units BID    . letrozole (FEMARA) 2.5 MG tablet Take 1 tablet (2.5 mg total) by mouth daily. 90 tablet 1  . levothyroxine (SYNTHROID, LEVOTHROID) 100 MCG tablet Take 1 tablet (100 mcg total) by mouth daily before breakfast. 90 tablet 1  . lisinopril (PRINIVIL,ZESTRIL) 10 MG tablet Take 1 tablet (10 mg total) by mouth daily. 30 tablet 6  . Magnesium Oxide 500 MG (LAX) TABS Take 500 mg by mouth 2 (two) times daily.    . meclizine (ANTIVERT) 12.5 MG tablet TAKE 1-2 TABLETS BY MOUTH EVERY 6 HOURS AS NEEDED FOR NAUSEA AND OR/DIZZINESS 60 tablet 0  . morphine (MS CONTIN) 15 MG 12 hr tablet 1 tab po qAM and 2 tabs po qhs 90 tablet 0  . omeprazole (PRILOSEC) 40 MG capsule Take 1 capsule (40 mg total) by mouth 2 (two) times daily. [place]  capsule 1  . oxyCODONE (OXY IR/ROXICODONE) 5 MG immediate release tablet Take 1-2 tablets (5-10 mg total) by mouth every 6 (six) hours as needed for moderate pain. 180 tablet 0  . polyethylene glycol (MIRALAX / GLYCOLAX) packet Take 17 g by mouth daily. 14 each 0  . pregabalin (LYRICA) 150 MG capsule Take 1 capsule (150 mg total) by mouth 2 (two) times daily. 60 capsule 5  . rOPINIRole (REQUIP) 2 MG tablet Take 2 mg by mouth at bedtime.    Marland Kitchen SPIRIVA HANDIHALER 18 MCG inhalation capsule INHALE CONTENTS OF 1 CAPSULE USING HANDIHALER EVERY DAY 90 capsule 0  . albuterol (PROVENTIL HFA;VENTOLIN HFA) 108 (90 BASE) MCG/ACT inhaler Inhale 2 puffs into the lungs every 6 (six) hours as needed for wheezing or shortness of breath. (Patient not taking: Reported on 06/17/2014) 1 Inhaler 2  .  benzonatate (TESSALON) 100 MG capsule Take 1 capsule (100 mg total) by mouth 3 (three) times daily. X 10 days (Patient not taking: Reported on 06/17/2014) 30 capsule 0   No facility-administered medications prior to visit.    PE: Blood pressure 109/63, pulse 91, temperature 98.4 F (36.9 C), temperature source Temporal, height 5' 4.5" (1.638 m), weight 236 lb (107.049 kg), SpO2 95 %. Gen: sitting comfortably in wheelchair, appears well, pleasant. CV: RRR, no m/r/g LUNGS: CTA bilat, nonlabored resps EXT: bilateral lymphedema of LL's from knees down, sensitive to touch all over LL's, minimal pitting edema present, no rash, no cords. Right ankle with mild diffuse discomfort with palpation, particularly medially.  ROM of L ankle a bit diminished.  No ankle erythema.  Mild right ankle warmth diffusely.  Cap refill in toes <2 sec.  IMPRESSION AND PLAN:  1) Persistent right ankle pain s/p fracture and subsequent ORIF about 5 mo ago. Per pt, x-rays and ortho f/u have been reassuring regarding adequate healing and appearance of stabilization hardware.  Will increase patient's oxycodone dosing for breakthrough pain: 10mg  tabs, 1-2 tid prn, #180 per month.  I encouraged her to go to her next ortho f/u 07/20/14 and if not given an explanation for her ongoing pain then I told her to tactfully ask Dr. Berenice Primas if he had any explanation or a plan on what to do next.  If no satisfactory answer then I told her she could either request that he refer them for a second opinion or they could call me and I would order referral to [facility] ortho for 2nd opinion. Regarding her chronic pain syndrome, I will be assuming rx responsibility for her narcotic pain med until her next pain mgmt MD appt 08/2014.  2) Insomnia secondary to chronic pain, also uncomfortable sleeping position is mandatory due to her ongoing ankle immobility. Increasing night time oxycodone dose to 15 mg (from 10mg ) will be our first step. Also, start  melatonin at 5mg  qhs and titrate up to 10mg  qhs after about a week.    3) Restless legs syndrome; daytime + night time sx's.   She start taking a scheduled afternoon dose of requip so her instructions on this rx will be changed to one 2mg  tab bid.  4) Chronic LE venous insufficiency edema: improved back on lasix 40mg  bid and now that she is able to elevate legs more with her new wheelchair.  Spent 25 min with pt today, with >50% of this time spent in counseling and care coordination regarding the above problems.  An After Visit Summary was printed and given to the patient.  FOLLOW UP: prn

## 2014-07-08 NOTE — Progress Notes (Signed)
Pre visit review using our clinic review tool, if applicable. No additional management support is needed unless otherwise documented below in the visit note. 

## 2014-07-08 NOTE — Patient Instructions (Addendum)
Buy OTC (generic) melatonin 5mg  tab and take 1 every night for 10d and then increase to 2 tabs every night.  Call me after your next f/u with Dr. Berenice Primas if a new referral to [facility] orthopedics is needed.

## 2014-07-14 ENCOUNTER — Telehealth: Payer: Self-pay | Admitting: *Deleted

## 2014-07-14 NOTE — Telephone Encounter (Signed)
Brooke Mccullough from [facility], called and left vm requesting verbal order to extend patient's home health PT/ 2 days a week for 4 more weeks. Please advise?

## 2014-07-14 NOTE — Telephone Encounter (Signed)
Pls give the OK/verbal order to extend PT as per their request.-thx

## 2014-07-15 NOTE — Telephone Encounter (Signed)
Left detailed message on Kendra from [facility] giving approval for PT extension.

## 2014-07-20 ENCOUNTER — Other Ambulatory Visit: Payer: Self-pay | Admitting: Family Medicine

## 2014-07-22 ENCOUNTER — Other Ambulatory Visit: Payer: Self-pay | Admitting: Family Medicine

## 2014-07-28 ENCOUNTER — Other Ambulatory Visit: Payer: Self-pay

## 2014-07-28 ENCOUNTER — Other Ambulatory Visit: Payer: Self-pay | Admitting: Family Medicine

## 2014-07-28 DIAGNOSIS — K219 Gastro-esophageal reflux disease without esophagitis: Secondary | ICD-10-CM

## 2014-07-28 MED ORDER — OMEPRAZOLE 40 MG PO CPDR: 40.0000 mg | DELAYED_RELEASE_CAPSULE | Freq: Every day | ORAL | Status: DC

## 2014-07-29 ENCOUNTER — Ambulatory Visit: Payer: Medicare Other | Admitting: Family Medicine

## 2014-07-30 ENCOUNTER — Telehealth: Payer: Self-pay | Admitting: *Deleted

## 2014-07-30 DIAGNOSIS — K219 Gastro-esophageal reflux disease without esophagitis: Secondary | ICD-10-CM

## 2014-07-30 MED ORDER — OMEPRAZOLE 40 MG PO CPDR: DELAYED_RELEASE_CAPSULE | ORAL | Status: DC

## 2014-07-30 NOTE — Telephone Encounter (Signed)
OK, omeprazole 40mg  twice a day sent to pt's pharmacy.

## 2014-07-30 NOTE — Telephone Encounter (Signed)
Patient notified

## 2014-07-30 NOTE — Telephone Encounter (Signed)
Patient left vm stating that she was unable to get her omeprazole refilled due to discrepancy on the number of pills she is taking daily. Patient stated that she was taking 1 tab qd but the rx was changed to 2 tab qd while she was doing PT. Patient would like an rx sent into pharmacy stating that she takes 2 tab qd. Patient has 2 pills left. Please advise?

## 2014-08-02 ENCOUNTER — Encounter (HOSPITAL_COMMUNITY): Payer: Self-pay | Admitting: Emergency Medicine

## 2014-08-02 ENCOUNTER — Encounter: Payer: Self-pay | Admitting: Family Medicine

## 2014-08-02 ENCOUNTER — Telehealth: Payer: Self-pay | Admitting: *Deleted

## 2014-08-02 ENCOUNTER — Ambulatory Visit (INDEPENDENT_AMBULATORY_CARE_PROVIDER_SITE_OTHER): Payer: Medicare Other | Admitting: Family Medicine

## 2014-08-02 ENCOUNTER — Emergency Department (HOSPITAL_COMMUNITY)
Admission: EM | Admit: 2014-08-02 | Discharge: 2014-08-02 | Disposition: A | Discharge status: Home / Self Care | Payer: Medicare Other | Attending: Emergency Medicine | Admitting: Emergency Medicine

## 2014-08-02 ENCOUNTER — Emergency Department (HOSPITAL_COMMUNITY): Payer: Medicare Other

## 2014-08-02 VITALS — BP 110/66 | HR 67 | Temp 98.2°F | Resp 22 | Ht 64.5 in

## 2014-08-02 DIAGNOSIS — I509 Heart failure, unspecified: Secondary | ICD-10-CM | POA: Insufficient documentation

## 2014-08-02 DIAGNOSIS — Z872 Personal history of diseases of the skin and subcutaneous tissue: Secondary | ICD-10-CM | POA: Diagnosis not present

## 2014-08-02 DIAGNOSIS — R404 Transient alteration of awareness: Secondary | ICD-10-CM | POA: Diagnosis not present

## 2014-08-02 DIAGNOSIS — Z853 Personal history of malignant neoplasm of breast: Secondary | ICD-10-CM | POA: Diagnosis not present

## 2014-08-02 DIAGNOSIS — M1990 Unspecified osteoarthritis, unspecified site: Secondary | ICD-10-CM | POA: Diagnosis not present

## 2014-08-02 DIAGNOSIS — Z91040 Latex allergy status: Secondary | ICD-10-CM | POA: Insufficient documentation

## 2014-08-02 DIAGNOSIS — J9611 Chronic respiratory failure with hypoxia: Secondary | ICD-10-CM

## 2014-08-02 DIAGNOSIS — K219 Gastro-esophageal reflux disease without esophagitis: Secondary | ICD-10-CM | POA: Diagnosis not present

## 2014-08-02 DIAGNOSIS — E119 Type 2 diabetes mellitus without complications: Secondary | ICD-10-CM | POA: Diagnosis not present

## 2014-08-02 DIAGNOSIS — Z8701 Personal history of pneumonia (recurrent): Secondary | ICD-10-CM | POA: Insufficient documentation

## 2014-08-02 DIAGNOSIS — F329 Major depressive disorder, single episode, unspecified: Secondary | ICD-10-CM | POA: Insufficient documentation

## 2014-08-02 DIAGNOSIS — G43909 Migraine, unspecified, not intractable, without status migrainosus: Secondary | ICD-10-CM | POA: Diagnosis not present

## 2014-08-02 DIAGNOSIS — R0600 Dyspnea, unspecified: Secondary | ICD-10-CM | POA: Diagnosis not present

## 2014-08-02 DIAGNOSIS — E039 Hypothyroidism, unspecified: Secondary | ICD-10-CM | POA: Insufficient documentation

## 2014-08-02 DIAGNOSIS — Z794 Long term (current) use of insulin: Secondary | ICD-10-CM | POA: Insufficient documentation

## 2014-08-02 DIAGNOSIS — F419 Anxiety disorder, unspecified: Secondary | ICD-10-CM | POA: Insufficient documentation

## 2014-08-02 DIAGNOSIS — E785 Hyperlipidemia, unspecified: Secondary | ICD-10-CM | POA: Insufficient documentation

## 2014-08-02 DIAGNOSIS — Z9981 Dependence on supplemental oxygen: Secondary | ICD-10-CM | POA: Insufficient documentation

## 2014-08-02 DIAGNOSIS — I499 Cardiac arrhythmia, unspecified: Secondary | ICD-10-CM

## 2014-08-02 DIAGNOSIS — J449 Chronic obstructive pulmonary disease, unspecified: Secondary | ICD-10-CM | POA: Diagnosis not present

## 2014-08-02 DIAGNOSIS — Z8673 Personal history of transient ischemic attack (TIA), and cerebral infarction without residual deficits: Secondary | ICD-10-CM | POA: Insufficient documentation

## 2014-08-02 DIAGNOSIS — Z86018 Personal history of other benign neoplasm: Secondary | ICD-10-CM | POA: Insufficient documentation

## 2014-08-02 DIAGNOSIS — I10 Essential (primary) hypertension: Secondary | ICD-10-CM | POA: Diagnosis not present

## 2014-08-02 DIAGNOSIS — Z7902 Long term (current) use of antithrombotics/antiplatelets: Secondary | ICD-10-CM | POA: Diagnosis not present

## 2014-08-02 DIAGNOSIS — J438 Other emphysema: Secondary | ICD-10-CM

## 2014-08-02 DIAGNOSIS — Z880 Allergy status to penicillin: Secondary | ICD-10-CM | POA: Insufficient documentation

## 2014-08-02 DIAGNOSIS — R0602 Shortness of breath: Secondary | ICD-10-CM | POA: Diagnosis present

## 2014-08-02 LAB — BASIC METABOLIC PANEL
Anion gap: 8 (ref 5–15)
BUN: 36 mg/dL — ABNORMAL HIGH (ref 6–23)
CO2: 33 mmol/L — ABNORMAL HIGH (ref 19–32)
CREATININE: 0.93 mg/dL (ref 0.50–1.10)
Calcium: 9.3 mg/dL (ref 8.4–10.5)
Chloride: 97 mmol/L (ref 96–112)
GFR, EST AFRICAN AMERICAN: 71 mL/min — AB (ref >90)
GFR, EST NON AFRICAN AMERICAN: 61 mL/min — AB (ref >90)
Glucose, Bld: 107 mg/dL — ABNORMAL HIGH (ref 70–99)
Potassium: 4.5 mmol/L (ref 3.5–5.1)
Sodium: 138 mmol/L (ref 135–145)

## 2014-08-02 LAB — CBC
HCT: 31.1 % — ABNORMAL LOW (ref 36.0–46.0)
Hemoglobin: 9.8 g/dL — ABNORMAL LOW (ref 12.0–15.0)
MCH: 25.3 pg — ABNORMAL LOW (ref 26.0–34.0)
MCHC: 31.5 g/dL (ref 30.0–36.0)
MCV: 80.2 fL (ref 78.0–100.0)
PLATELETS: 123 10*3/uL — AB (ref 150–400)
RBC: 3.88 MIL/uL (ref 3.87–5.11)
RDW: 13.6 % (ref 11.5–15.5)
WBC: 7.9 10*3/uL (ref 4.0–10.5)

## 2014-08-02 LAB — HEPATIC FUNCTION PANEL
ALBUMIN: 3.5 g/dL (ref 3.5–5.2)
ALK PHOS: 106 U/L (ref 39–117)
ALT: 18 U/L (ref 0–35)
AST: 24 U/L (ref 0–37)
Bilirubin, Direct: 0.2 mg/dL (ref 0.0–0.5)
Indirect Bilirubin: 0.6 mg/dL (ref 0.3–0.9)
Total Bilirubin: 0.8 mg/dL (ref 0.3–1.2)
Total Protein: 6.7 g/dL (ref 6.0–8.3)

## 2014-08-02 LAB — I-STAT TROPONIN, ED: TROPONIN I, POC: 0.01 ng/mL (ref 0.00–0.08)

## 2014-08-02 LAB — BRAIN NATRIURETIC PEPTIDE: B NATRIURETIC PEPTIDE 5: 56.8 pg/mL (ref 0.0–100.0)

## 2014-08-02 MED ORDER — OXYCODONE HCL 5 MG PO TABS: 10.0000 mg | ORAL_TABLET | Freq: Once | ORAL | Status: DC

## 2014-08-02 MED ORDER — FUROSEMIDE 40 MG PO TABS: 60.0000 mg | ORAL_TABLET | Freq: Two times a day (BID) | ORAL | Status: DC

## 2014-08-02 MED ORDER — FUROSEMIDE 10 MG/ML IJ SOLN
80.0000 mg | Freq: Once | INTRAMUSCULAR | Status: AC
Start: 2014-08-02 — End: 2014-08-02
  Administered 2014-08-02: 80 mg via INTRAVENOUS
  Filled 2014-08-02: qty 8

## 2014-08-02 MED ORDER — OXYCODONE-ACETAMINOPHEN 5-325 MG PO TABS
2.0000 | ORAL_TABLET | Freq: Once | ORAL | Status: AC
  Administered 2014-08-02: 2 via ORAL
  Filled 2014-08-02: qty 2

## 2014-08-02 NOTE — ED Provider Notes (Signed)
CSN: [id number]     Arrival date & time 08/02/14  1629 History   First MD Initiated Contact with Patient 08/02/14 1644     Chief Complaint  Patient presents with  . Shortness of Breath     (Consider location/radiation/quality/duration/timing/severity/associated sxs/prior Treatment) HPI  [name] with PMH COPD, CHF, with recent admission for COPD exacerbation, CHF exacerbation,  who presents with dyspnea.  Onset 2 weeks ago, at home.  Persistent.  Described as "short of breath."  She previously could walk in between the living room and the bedroom with PT at home 4 times.  She can now do this one time.  No alleviation with home dose of 80 mg lasix daily.  No adjustment of lasix were made by PCP.  Negative for CP, abdominal pain, cough, fever, chills.  Negative for abdominal pain, nausea, vomiting, diaphoresis, unilateral leg swelling.  Positive for worsening LE swelling.  She denies new O2 requirement at this time.  Past Medical History  Diagnosis Date  . COPD (chronic obstructive pulmonary disease)     nocturnal oxygen, plus daytime use with activity  . Fibromyalgia     Dx'd 1993.  This has impaired her significantly.  . IBS (irritable bowel syndrome)   . Edema   . Gallstones     asymptomatic per pt (as of 09/30/2013); mild chronic elevation of Alk phos (remainder of hepatic panel wnl).  . Hernia of abdominal wall   . Hypertension   . Hyperlipidemia   . On home oxygen therapy     "3.5L @ night and during day prn" (09/28/2013)  . History of pneumonia     "once"  . Adrenal benign tumor   . Type II diabetes mellitus     hx of good control per pt report + HbA1c <7% June 2015.  Marland Kitchen Anemia 2015    Baseline Hb 11, normocytic.  Marland Kitchen GERD (gastroesophageal reflux disease)   . History of duodenal ulcer   . Daily headache   . Migraine     "@ least monthly" (09/28/2013)  . TIA (transient ischemic attack) 2012; 2014    left facial and left arm numbness  . Degenerative disc disease      Cervical and lumbar  . Osteoarthritis (arthritis due to wear and tear of joints)     "back; knees; elbows; left ankle" (09/28/2013)  . Chronic lower back pain     "L5-S1" (09/28/2013)  . Gout   . Anxiety   . Depression   . Breast cancer 2008    "right".  Lumpectomy + radiation+aromatase inhibitor (femara).  Regular f/u and mammos normal.  . Tremor     "upper extremities; last 6-7 months" (09/28/2013)  . Unspecified hypothyroidism 12/21/2013  . Multinodular goiter summer 2015    FNA 03/01/13 "Follicular lesion of undetermined significance": a follicular lesion/neoplasm can not be ruled out--Dr. Cruzita Mccullough recommended repeat bx in 6-12 mo  . Closed right trimalleolar fracture 01/2014    Surgery/fixation hardware placed.  . Chronic diastolic CHF (congestive heart failure) 2015   Past Surgical History  Procedure Laterality Date  . Lumbar laminectomy  1991; 1996  . Ankle fracture surgery Left 08/2011    Bimalleolar fx  . Total abdominal hysterectomy w/ bilateral salpingoophorectomy  1990  . Breast biopsy Right 2008  . Breast lumpectomy Right 2008  . Cataract extraction w/ intraocular lens implant Right 08/2013  . Cardiac catheterization  2000's X 2  . Bladder tack    . Orif  ankle fracture Right 02/11/2014    Procedure: OPEN REDUCTION INTERNAL FIXATION (ORIF) ANKLE FRACTURE;  Surgeon: Brooke Corning, MD;  Location: [place];  Service: Orthopedics;  Laterality: Right;  . Transthoracic echocardiogram  03/2014    Mild LVH, EF 60-65%, grade I diast dysfxn.  Valves fine.   Family History  Problem Relation Age of Onset  . Arthritis Mother   . Breast cancer Mother   . Hyperlipidemia Mother   . Stroke Mother   . Hypertension Mother   . Mental illness Mother   . Arthritis Father   . Cancer Father   . Hyperlipidemia Father   . Heart disease Father   . Heart disease Maternal Grandmother   . Hyperlipidemia Maternal Grandmother   . Heart disease Paternal Grandmother   . Hypertension Paternal  Grandmother   . Diabetes Paternal Grandmother   . Arthritis Paternal Grandfather    History  Substance Use Topics  . Smoking status: Former Smoker -- 1.00 packs/day for 36 years    Types: Cigarettes    Quit date: 09/09/2013  . Smokeless tobacco: Never Used  . Alcohol Use: No   OB History    No data available     Review of Systems  Constitutional: Negative for fever and chills.  HENT: Negative for facial swelling.   Eyes: Negative for photophobia and pain.  Respiratory: Positive for shortness of breath. Negative for cough.   Cardiovascular: Positive for leg swelling. Negative for chest pain.  Gastrointestinal: Negative for nausea, vomiting and abdominal pain.  Genitourinary: Negative for dysuria.  Musculoskeletal: Negative for arthralgias.  Skin: Negative for rash and wound.  Neurological: Negative for seizures.  Hematological: Negative for adenopathy.      Allergies  Ciprofloxacin; Latex; Clindamycin/lincomycin; Fentanyl; Indocin; Keflex; Penicillins; and Vancomycin  Home Medications   Prior to Admission medications   Medication Sig Start Date End Date Taking? Authorizing Provider  albuterol (PROVENTIL HFA;VENTOLIN HFA) 108 (90 BASE) MCG/ACT inhaler Inhale 2 puffs into the lungs every 6 (six) hours as needed for wheezing or shortness of breath. 10/10/13   Brooke Polite, MD  albuterol (PROVENTIL) (2.5 MG/3ML) 0.083% nebulizer solution Take 3 mLs (2.5 mg total) by nebulization every 4 (four) hours as needed for wheezing or shortness of breath. 01/18/14   Brooke Sou, MD  ALPRAZolam Duanne Moron) 0.5 MG tablet Take 1 tablet (0.5 mg total) by mouth 2 (two) times daily as needed for anxiety or sleep. 07/06/14   Brooke Sou, MD  atorvastatin (LIPITOR) 40 MG tablet TAKE 1 TABLET BY MOUTH DAILY 07/20/14   Brooke Sou, MD  benzonatate (TESSALON) 100 MG capsule Take 1 capsule (100 mg total) by mouth 3 (three) times daily. X 10 days 04/29/14   Brooke Krystal Eaton, MD  cetirizine  (ZYRTEC) 10 MG tablet Take 10 mg by mouth daily as needed for allergies.    Historical Provider, MD  cholecalciferol (VITAMIN D) 1000 UNITS tablet Take 1,000 Units by mouth daily.    Historical Provider, MD  citalopram (CELEXA) 20 MG tablet TAKE 1 TABLET BY MOUTH EVERY DAY Patient not taking: Reported on 08/02/2014 07/28/14   Brooke Sou, MD  citalopram (CELEXA) 40 MG tablet Take 40 mg by mouth daily.    Historical Provider, MD  clopidogrel (PLAVIX) 75 MG tablet Take 1 tablet (75 mg total) by mouth daily. 06/17/14   Brooke Sou, MD  dextromethorphan-guaiFENesin (TUSSIN DM) 10-100 MG/5ML liquid Take 10 mLs by mouth every 4 (four) hours as needed for cough.  Historical Provider, MD  furosemide (LASIX) 40 MG tablet Take 1 tablet (40 mg total) by mouth 2 (two) times daily. 07/06/14   Brooke Sou, MD  insulin aspart (NOVOLOG) 100 UNIT/ML injection 3 U SQ qAC 06/17/14   Brooke Sou, MD  Insulin Glargine (LANTUS) 100 UNIT/ML Solostar Pen Inject 40 Units into the skin 2 (two) times daily. 40 units BID 11/25/13   Brooke Sou, MD  letrozole [facility]) 2.5 MG tablet Take 1 tablet (2.5 mg total) by mouth daily. 11/30/13   Brooke Sou, MD  levothyroxine (SYNTHROID, LEVOTHROID) 100 MCG tablet Take 1 tablet (100 mcg total) by mouth daily before breakfast. 02/09/14   Brooke Sou, MD  lisinopril (PRINIVIL,ZESTRIL) 10 MG tablet Take 1 tablet (10 mg total) by mouth daily. 11/12/13   Brooke Sou, MD  Magnesium Oxide 500 MG (LAX) TABS Take 500 mg by mouth 2 (two) times daily.    Historical Provider, MD  meclizine (ANTIVERT) 12.5 MG tablet TAKE 1 TO 2 TABLETS BY MOUTH EVERY 6 HOURS AS NEEDED FOR NAUSEA OR DIZZINESS 07/22/14   Brooke Sou, MD  morphine (MS CONTIN) 15 MG 12 hr tablet 1 tab po qAM and 2 tabs po qhs 07/07/14   Brooke Sou, MD  omeprazole (PRILOSEC) 40 MG capsule 1 tab po bid 07/30/14   Brooke Sou, MD  Oxycodone HCl 10 MG TABS 1-2 tabs po tid prn severe pain 07/08/14    Brooke Sou, MD  polyethylene glycol (MIRALAX / GLYCOLAX) packet Take 17 g by mouth daily. 04/29/14   Brooke Krystal Eaton, MD  pregabalin (LYRICA) 150 MG capsule Take 1 capsule (150 mg total) by mouth 2 (two) times daily. 12/01/13   Brooke Sou, MD  rOPINIRole (REQUIP) 2 MG tablet 1 tab po bid 07/08/14   Brooke Sou, MD  SPIRIVA HANDIHALER 18 MCG inhalation capsule INHALE CONTENTS OF 1 CAPSULE USING HANDIHALER EVERY DAY 06/24/14   Brooke Sou, MD   BP 123/47 mmHg  Pulse 64  Temp(Src) 97.5 F (36.4 C) (Oral)  Resp 15  Ht 5' 4"  (1.626 m)  Wt 217 lb 4.8 oz (98.567 kg)  BMI 37.28 kg/m2  SpO2 98% Physical Exam  Constitutional: She is oriented to person, place, and time. She appears well-developed and well-nourished. No distress.  HENT:  Head: Normocephalic and atraumatic.  Mouth/Throat: No oropharyngeal exudate.  Eyes: Conjunctivae are normal. Pupils are equal, round, and reactive to light. No scleral icterus.  Neck: Normal range of motion. No tracheal deviation present. No thyromegaly present.  Cardiovascular: Normal rate, regular rhythm and normal heart sounds.  Exam reveals no gallop and no friction rub.   No murmur heard. Pulmonary/Chest: Effort normal. No stridor. No respiratory distress. She has no wheezes. She has rales. She exhibits no tenderness.  Abdominal: Soft. She exhibits no distension and no mass. There is no tenderness. There is no rebound and no guarding.  Musculoskeletal: Normal range of motion. She exhibits edema (BLEs).  Neurological: She is alert and oriented to person, place, and time.  Skin: Skin is warm and dry. She is not diaphoretic.    ED Course  Procedures (including critical care time) Labs Review Labs Reviewed  CBC - Abnormal; Notable for the following:    Hemoglobin 9.8 (*)    HCT 31.1 (*)    MCH 25.3 (*)    Platelets 123 (*)    All other components within normal limits  BASIC METABOLIC PANEL - Abnormal;  Notable for the following:    CO2 33  (*)    Glucose, Bld 107 (*)    BUN 36 (*)    GFR calc non Af Amer 61 (*)    GFR calc Af Amer 71 (*)    All other components within normal limits  BRAIN NATRIURETIC PEPTIDE  HEPATIC FUNCTION PANEL  I-STAT TROPOININ, ED    Imaging Review Dg Chest 2 View  08/02/2014   CLINICAL DATA:  Progressive shortness of breath over the last 2 weeks. Home oxygen. Diminished lung sounds. COPD. Prior tobacco use.  EXAM: CHEST  2 VIEW  COMPARISON:  06/10/2014  FINDINGS: Stable elevation of the right hemidiaphragm. Moderate enlargement of the cardiopericardial silhouette noted with mild interstitial accentuation similar to prior. Mild atelectasis in the right lower lobe appreciable on the lateral projection.  No pleural effusion.  IMPRESSION: 1. Stable appearance of chronic right hemidiaphragmatic elevation with atelectasis at the right lung base, and moderate cardiomegaly. Indistinct pulmonary vasculature but no overt edema.   Electronically Signed   By: Van Clines M.D.   On: 08/02/2014 19:17     EKG Interpretation   Date/Time:  Monday August 02 2014 16:36:40 EDT Ventricular Rate:  68 PR Interval:  163 QRS Duration: 91 QT Interval:  397 QTC Calculation: 422 R Axis:   34 Text Interpretation:  Sinus rhythm Low voltage, precordial leads artifact  noted Otherwise no significant change Confirmed by Christy Gentles  MD, Elenore Rota  [phone]) on 08/02/2014 4:41:40 PM      MDM   Final diagnoses:  Chronic congestive heart failure, unspecified congestive heart failure type    [name] with PMH COPD, CHF, with recent admission for COPD exacerbation, CHF exacerbation,  who presents with dyspnea.  Onset 2 weeks ago, at home.  Persistent.  Described as "short of breath."  She previously could walk in between the living room and the bedroom with PT at home 4 times.  She can now do this one time.  No alleviation with home dose of 80 mg lasix daily.  No adjustment of lasix were made by PCP.  Negative for  CP, abdominal pain, cough, fever, chills.  Negative for abdominal pain, nausea, vomiting, diaphoresis, unilateral leg swelling.  Positive for worsening LE swelling.  She denies new O2 requirement at this time.  Vitals WNL on home O2.  CV exam is difficult due to habitus.  Pulm exam reveals bilateral rales.  EKG without acute change. DDx at this time includes COPD exacerbation vs CHF exacerbation.  I doubt ACS, PE, PTX, PNA, dissection.  Will fu radiographic, lab results, cont to monitor closely, ensure no desaturation while ambulating.    CXR is without pulmonary edema.  Negative for acute changes on EKG.  BNP is stable.  Negative for AKI or abnormalities on LFTs.  Pt ambulates with home O2 and sats 100%.  At this time, there is no indication for inpatient eval or treatment.  Pt stable for discharge, FU c PCP.  I have increased her lasix dosage by 20 mg BID.  All questions answered.  Return precautions given.  I have discussed case and care has been guided by my attending physician, Dr. Christy Gentles.   Doy Hutching, MD 08/03/14 [id number]  Ripley Fraise, MD 08/03/14 1126

## 2014-08-02 NOTE — ED Notes (Signed)
Pt ambulated in room, oxygen noted to be 100% on 3.5L home o2.

## 2014-08-02 NOTE — ED Notes (Signed)
Per eMS: pt from doctors office for eval of sob x2 weeks and getting worse today, EMS notes pt normally on 3.5Lhome o2 and has been increased to 4L to help with breathing. Pt also reports having to sit up more at bedtime to breath. EMS also noted diminished lung sounds to lower lobes. nad noted.

## 2014-08-02 NOTE — ED Notes (Signed)
Pt ambulated to door of room and c/o dizziness and wanted to go back to bed. NT returned pt to bed.

## 2014-08-02 NOTE — Telephone Encounter (Signed)
OK to extend [facility] aid and nursing as per request.

## 2014-08-02 NOTE — ED Notes (Signed)
Patient transported to X-ray 

## 2014-08-02 NOTE — Telephone Encounter (Signed)
Brooke Mccullough from [place] called requesting verbal orders for pt. Nurse wants to continue home health aid and nursing for 60 mor days. Please advise? Also nurse wanted you to know that they can do mobile, all you have to do is send in an order. x-ray. Patient is still having limb weakness, chills and mild confusion. cb [phone]

## 2014-08-02 NOTE — Progress Notes (Signed)
Pre visit review using our clinic review tool, if applicable. No additional management support is needed unless otherwise documented below in the visit note. 

## 2014-08-02 NOTE — Discharge Instructions (Signed)

## 2014-08-02 NOTE — Patient Instructions (Signed)
Please go straight to the emergency department for further evaluation

## 2014-08-02 NOTE — ED Provider Notes (Signed)
Patient seen/examined in the Emergency Department in conjunction with Resident Physician Provider [facility] Patient reports shortness of breath Exam : awake/alert, crackles bilaterally Plan: imaging pending.  Suspect pt will need admission    Ripley Fraise, MD 08/02/14 [phone]

## 2014-08-02 NOTE — Progress Notes (Signed)
OFFICE VISIT  08/02/2014   CC:  Chief Complaint  Patient presents with  . Allergies  . Shortness of Breath   HPI:    Patient is a 69 y.o. Caucasian female who presents for 1 week hx of feeling like she is having trouble breathing. Feels it at rest but worse with exertion, feels like can't get air out.  Subjective fever first day or two but this seems to have resolved.  Nasal congestion the last couple days, taking zyrtec and it helps some.  Denies CP. Appetite down.  Some nausea and heartburn but no vomiting.  No cough in daytime but some cough that awakens her at night and she is assuming this is GERD.  Has to sit up more elevated in order to sleep lately (more than usual). No change in LE swelling in the last week.  No palpitations or racing heart. Mentally she sometimes feels like she is in another world.   Oxygen drops into 80s with exertion and she has turned her oxygen from usual level of 3.5L/min to 4 L/min about 4-5 d/a.  Her oxygen goes back up over 93% after resting.  She has not taken any albuterol for her SOB.   Past Medical History  Diagnosis Date  . COPD (chronic obstructive pulmonary disease)     nocturnal oxygen, plus daytime use with activity  . Fibromyalgia     Dx'd 1993.  This has impaired her significantly.  . IBS (irritable bowel syndrome)   . Edema   . Gallstones     asymptomatic per pt (as of 09/30/2013); mild chronic elevation of Alk phos (remainder of hepatic panel wnl).  . Hernia of abdominal wall   . Hypertension   . Hyperlipidemia   . On home oxygen therapy     "3.5L @ night and during day prn" (09/28/2013)  . History of pneumonia     "once"  . Adrenal benign tumor   . Type II diabetes mellitus     hx of good control per pt report + HbA1c <7% June 2015.  Marland Kitchen Anemia 2015    Baseline Hb 11, normocytic.  Marland Kitchen GERD (gastroesophageal reflux disease)   . History of duodenal ulcer   . Daily headache   . Migraine     "@ least monthly" (09/28/2013)  . TIA  (transient ischemic attack) 2012; 2014    left facial and left arm numbness  . Degenerative disc disease     Cervical and lumbar  . Osteoarthritis (arthritis due to wear and tear of joints)     "back; knees; elbows; left ankle" (09/28/2013)  . Chronic lower back pain     "L5-S1" (09/28/2013)  . Gout   . Anxiety   . Depression   . Breast cancer 2008    "right".  Lumpectomy + radiation+aromatase inhibitor (femara).  Regular f/u and mammos normal.  . Tremor     "upper extremities; last 6-7 months" (09/28/2013)  . Unspecified hypothyroidism 12/21/2013  . Multinodular goiter summer 2015    FNA 56/3/14 "Follicular lesion of undetermined significance": a follicular lesion/neoplasm can not be ruled out--Dr. Cruzita Lederer recommended repeat bx in 6-12 mo  . Closed right trimalleolar fracture 01/2014    Surgery/fixation hardware placed.    Past Surgical History  Procedure Laterality Date  . Lumbar laminectomy  1991; 1996  . Ankle fracture surgery Left 08/2011    Bimalleolar fx  . Total abdominal hysterectomy w/ bilateral salpingoophorectomy  1990  . Breast biopsy Right 2008  .  Breast lumpectomy Right 2008  . Cataract extraction w/ intraocular lens implant Right 08/2013  . Cardiac catheterization  2000's X 2  . Bladder tack    . Orif ankle fracture Right 02/11/2014    Procedure: OPEN REDUCTION INTERNAL FIXATION (ORIF) ANKLE FRACTURE;  Surgeon: Alta Corning, MD;  Location: [place];  Service: Orthopedics;  Laterality: Right;  . Transthoracic echocardiogram  03/2014    Mild LVH, EF 60-65%, grade I diast dysfxn.  Valves fine.    Outpatient Prescriptions Prior to Visit  Medication Sig Dispense Refill  . albuterol (PROVENTIL HFA;VENTOLIN HFA) 108 (90 BASE) MCG/ACT inhaler Inhale 2 puffs into the lungs every 6 (six) hours as needed for wheezing or shortness of breath. 1 Inhaler 2  . albuterol (PROVENTIL) (2.5 MG/3ML) 0.083% nebulizer solution Take 3 mLs (2.5 mg total) by nebulization every 4 (four) hours as  needed for wheezing or shortness of breath. 75 mL 0  . ALPRAZolam (XANAX) 0.5 MG tablet Take 1 tablet (0.5 mg total) by mouth 2 (two) times daily as needed for anxiety or sleep. 60 tablet 5  . atorvastatin (LIPITOR) 40 MG tablet TAKE 1 TABLET BY MOUTH DAILY 30 tablet 3  . benzonatate (TESSALON) 100 MG capsule Take 1 capsule (100 mg total) by mouth 3 (three) times daily. X 10 days 30 capsule 0  . cetirizine (ZYRTEC) 10 MG tablet Take 10 mg by mouth daily as needed for allergies.    . cholecalciferol (VITAMIN D) 1000 UNITS tablet Take 1,000 Units by mouth daily.    . citalopram (CELEXA) 40 MG tablet Take 40 mg by mouth daily.    . clopidogrel (PLAVIX) 75 MG tablet Take 1 tablet (75 mg total) by mouth daily. 90 tablet 3  . dextromethorphan-guaiFENesin (TUSSIN DM) 10-100 MG/5ML liquid Take 10 mLs by mouth every 4 (four) hours as needed for cough.    . furosemide (LASIX) 40 MG tablet Take 1 tablet (40 mg total) by mouth 2 (two) times daily. 60 tablet 6  . insulin aspart (NOVOLOG) 100 UNIT/ML injection 3 U SQ qAC 10 mL   . Insulin Glargine (LANTUS) 100 UNIT/ML Solostar Pen Inject 40 Units into the skin 2 (two) times daily. 40 units BID    . letrozole (FEMARA) 2.5 MG tablet Take 1 tablet (2.5 mg total) by mouth daily. 90 tablet 1  . levothyroxine (SYNTHROID, LEVOTHROID) 100 MCG tablet Take 1 tablet (100 mcg total) by mouth daily before breakfast. 90 tablet 1  . lisinopril (PRINIVIL,ZESTRIL) 10 MG tablet Take 1 tablet (10 mg total) by mouth daily. 30 tablet 6  . Magnesium Oxide 500 MG (LAX) TABS Take 500 mg by mouth 2 (two) times daily.    . meclizine (ANTIVERT) 12.5 MG tablet TAKE 1 TO 2 TABLETS BY MOUTH EVERY 6 HOURS AS NEEDED FOR NAUSEA OR DIZZINESS 60 tablet 0  . morphine (MS CONTIN) 15 MG 12 hr tablet 1 tab po qAM and 2 tabs po qhs 90 tablet 0  . omeprazole (PRILOSEC) 40 MG capsule 1 tab po bid 60 capsule 3  . Oxycodone HCl 10 MG TABS 1-2 tabs po tid prn severe pain 180 tablet 0  . polyethylene  glycol (MIRALAX / GLYCOLAX) packet Take 17 g by mouth daily. 14 each 0  . pregabalin (LYRICA) 150 MG capsule Take 1 capsule (150 mg total) by mouth 2 (two) times daily. 60 capsule 5  . rOPINIRole (REQUIP) 2 MG tablet 1 tab po bid 60 tablet 6  . SPIRIVA HANDIHALER 18 MCG  inhalation capsule INHALE CONTENTS OF 1 CAPSULE USING HANDIHALER EVERY DAY 90 capsule 0  . citalopram (CELEXA) 20 MG tablet TAKE 1 TABLET BY MOUTH EVERY DAY (Patient not taking: Reported on 08/02/2014) 30 tablet 0   No facility-administered medications prior to visit.    Allergies  Allergen Reactions  . Ciprofloxacin Other (See Comments)    Unspecified--noted in old PCP's records.  . Latex Other (See Comments)    Unspecified: noted in old PCP's records.  . Clindamycin/Lincomycin Rash  . Fentanyl Rash    Duragesic Patch  . Indocin [Indomethacin] Rash  . Keflex [Cephalexin] Rash  . Penicillins Rash  . Vancomycin Rash   ROS As per HPI  PE: Blood pressure 110/66, pulse 67, temperature 98.2 F (36.8 C), temperature source Temporal, resp. rate 22, height 5' 4.5" (1.638 m), SpO2 93 %. 4L oxygen nasal cannulae Gen: alert, tired appearing but focuses well and converses normally. XLE:ZVGJ: no injection, icteris, swelling, or exudate.  EOMI, PERRLA. Mouth: lips without lesion/swelling.  Oral mucosa pink and moist. Oropharynx without erythema, exudate, or swelling.  CV: S1 and S2 distant, difficult to discern any heart sounds with regularity, with suspicion of irregular pulse palpable in left radial area: suspect irregular cardiac rhythm vs sinus with frequent ectopy (see EKG result) LUNGS: CTA bilat, diminished aeration diffusely, without wheezing or labored breathing. EXT: nonpitting edema in both LL's.  Fingernails pink.  LABS:  12 lead EKG today: atrial rhythm, rate 81, poor R wave progression (new since last EKG 05/2014).  No ST/T wave changes suggestive of ischemia.   Chemistry      Component Value Date/Time   NA 139  06/17/2014 1525   K 3.8 06/17/2014 1525   CL 100 06/17/2014 1525   CO2 33* 06/17/2014 1525   BUN 31* 06/17/2014 1525   CREATININE 0.83 06/17/2014 1525      Component Value Date/Time   CALCIUM 8.7 06/17/2014 1525   ALKPHOS 91 06/10/2014 1907   AST 15 06/10/2014 1907   ALT 12 06/10/2014 1907   BILITOT 0.6 06/10/2014 1907     Lab Results  Component Value Date   WBC 6.9 06/10/2014   HGB 10.1* 06/10/2014   HCT 30.8* 06/10/2014   MCV 80.4 06/10/2014   PLT 120* 06/10/2014    IMPRESSION AND PLAN:  Worsening dyspnea: pt with acute on chronic hypoxic resp failure, needs evaluation in ED to discern etiology, as she has history of both COPD and diastolic CHF and during her most recent severe resp illness she had hypercapnea.  I feel like she needs ABG, TnI, BNP, and CXR in addition to basic CBC/BMET.  Pt is stable but has level of chronic illness/dysfunction that makes her likelihood of quickly getting unstable pretty high. Pt and husband understand the situation and agree with plan.  Will have EMS transport pt to ED. No meds were given to pt during this visit.  An After Visit Summary was printed and given to the patient.  Spent 55 min with pt today, with >50% of this time spent in counseling and care coordination regarding the above problems.  FOLLOW UP: Return for return to office prn (pt is to go to the ED from the office today).

## 2014-08-03 NOTE — Telephone Encounter (Signed)
Tiny from home health notified.

## 2014-08-05 ENCOUNTER — Other Ambulatory Visit: Payer: Self-pay | Admitting: *Deleted

## 2014-08-05 NOTE — Telephone Encounter (Signed)
Refill request for MS Contin Last filled by MD on- 07/07/14 #90 x0 Last Appt: 08/02/2014 Next Appt: 08/16/2014 Please advise refill?

## 2014-08-06 MED ORDER — MORPHINE SULFATE ER 15 MG PO TBCR: EXTENDED_RELEASE_TABLET | ORAL | Status: DC

## 2014-08-06 NOTE — Addendum Note (Signed)
Addended by: Tammi Sou on: 08/06/2014 12:19 PM   Modules accepted: Level of Service

## 2014-08-09 NOTE — Telephone Encounter (Signed)
MS contin rx printed for pt on 08/06/14.

## 2014-08-16 ENCOUNTER — Encounter: Payer: Self-pay | Admitting: Family Medicine

## 2014-08-16 ENCOUNTER — Ambulatory Visit: Payer: Medicare Other | Admitting: Family Medicine

## 2014-08-18 DIAGNOSIS — S82851D Displaced trimalleolar fracture of right lower leg, subsequent encounter for closed fracture with routine healing: Secondary | ICD-10-CM | POA: Diagnosis not present

## 2014-08-20 ENCOUNTER — Ambulatory Visit (INDEPENDENT_AMBULATORY_CARE_PROVIDER_SITE_OTHER): Payer: Medicare Other | Admitting: Family Medicine

## 2014-08-20 ENCOUNTER — Encounter: Payer: Self-pay | Admitting: Family Medicine

## 2014-08-20 VITALS — BP 126/71 | HR 81 | Temp 98.1°F | Resp 22

## 2014-08-20 DIAGNOSIS — J449 Chronic obstructive pulmonary disease, unspecified: Secondary | ICD-10-CM

## 2014-08-20 DIAGNOSIS — S82851G Displaced trimalleolar fracture of right lower leg, subsequent encounter for closed fracture with delayed healing: Secondary | ICD-10-CM

## 2014-08-20 DIAGNOSIS — R627 Adult failure to thrive: Secondary | ICD-10-CM

## 2014-08-20 DIAGNOSIS — R002 Palpitations: Secondary | ICD-10-CM | POA: Diagnosis not present

## 2014-08-20 DIAGNOSIS — R0602 Shortness of breath: Secondary | ICD-10-CM | POA: Diagnosis not present

## 2014-08-20 DIAGNOSIS — R5383 Other fatigue: Secondary | ICD-10-CM | POA: Diagnosis not present

## 2014-08-20 LAB — COMPREHENSIVE METABOLIC PANEL
ALT: 19 U/L (ref 0–35)
AST: 21 U/L (ref 0–37)
Albumin: 3.6 g/dL (ref 3.5–5.2)
Alkaline Phosphatase: 118 U/L — ABNORMAL HIGH (ref 39–117)
BILIRUBIN TOTAL: 0.4 mg/dL (ref 0.2–1.2)
BUN: 46 mg/dL — AB (ref 6–23)
CO2: 29 mEq/L (ref 19–32)
CREATININE: 0.91 mg/dL (ref 0.50–1.10)
Calcium: 9 mg/dL (ref 8.4–10.5)
Chloride: 96 mEq/L (ref 96–112)
Glucose, Bld: 222 mg/dL — ABNORMAL HIGH (ref 70–99)
Potassium: 4.2 mEq/L (ref 3.5–5.3)
Sodium: 137 mEq/L (ref 135–145)
Total Protein: 6.5 g/dL (ref 6.0–8.3)

## 2014-08-20 LAB — C-REACTIVE PROTEIN: CRP: 2 mg/dL — ABNORMAL HIGH (ref <0.60)

## 2014-08-20 LAB — TSH: TSH: 0.327 u[IU]/mL — ABNORMAL LOW (ref 0.350–4.500)

## 2014-08-20 MED ORDER — BUPROPION HCL ER (XL) 150 MG PO TB24: 150.0000 mg | ORAL_TABLET | Freq: Every day | ORAL | Status: DC

## 2014-08-20 NOTE — Progress Notes (Signed)
Pre visit review using our clinic review tool, if applicable. No additional management support is needed unless otherwise documented below in the visit note. 

## 2014-08-20 NOTE — Progress Notes (Signed)
OFFICE VISIT  08/20/2014   CC:  Chief Complaint  Patient presents with  . Follow-up    per PT pt is getting weaker.    HPI:    Patient is a 69 y.o. Caucasian female who presents for f/u multiple chronic medical comorbidities, polypharmacy. Most recent visit I sent her to ED for worsening of her resp status and the w/u was reassuring (CXR showed likely pulm vasc congestion and CM but no overt pulm edema) and she was d/c'd home on 71m more of lasix at each daily dosing (now on 647mbid)--that was 18 days ago.  PT reports she is acting weaker lately. Ortho has released her and say there is nothing further they can do, recommended continue brace and PT.  Some question of non-union or other x-ray abnormality noted by PA per pt, but Dr. GrBerenice Primasaid everything was fine. Pt asks for 2nd opinion.  Appetite is down except sometimes sweet things appeal to her.  Wants to sleep all the time--says it has to do with wanting to w/draw from others.  She admits she is depressed and hopeless due to her health problems. She has been on citalopram long term. Says she has absolutely no energy. She can now hardly walk from the wheelchair to the commode (3 feet).    Still having random episodes of SOB, disequilibrium.  Sometimes desats into 70s during these episodes and HR reportedly 160 but she did not feel heart racing or palpitations.  She seems to come out of her periods of SOB almost as quickly as they start.    Past Medical History  Diagnosis Date  . COPD (chronic obstructive pulmonary disease)     nocturnal oxygen, plus daytime use with activity  . Fibromyalgia     Dx'd 1993.  This has impaired her significantly.  . IBS (irritable bowel syndrome)   . Edema   . Gallstones     asymptomatic per pt (as of 09/30/2013); mild chronic elevation of Alk phos (remainder of hepatic panel wnl).  . Hernia of abdominal wall   . Hypertension   . Hyperlipidemia   . On home oxygen therapy     "3.5L @ night and  during day prn" (09/28/2013)  . History of pneumonia     "once"  . Adrenal benign tumor   . Type II diabetes mellitus     hx of good control per pt report + HbA1c <7% June 2015.  . Marland Kitchennemia 2015    Baseline Hb 11, normocytic.  . Marland KitchenERD (gastroesophageal reflux disease)   . History of duodenal ulcer   . Daily headache   . Migraine     "@ least monthly" (09/28/2013)  . TIA (transient ischemic attack) 2012; 2014    left facial and left arm numbness  . Degenerative disc disease     Cervical and lumbar  . Osteoarthritis (arthritis due to wear and tear of joints)     "back; knees; elbows; left ankle" (09/28/2013)  . Chronic lower back pain     "L5-S1" (09/28/2013)  . Gout   . Anxiety   . Depression   . Breast cancer 2008    "right".  Lumpectomy + radiation+aromatase inhibitor (femara).  Regular f/u and mammos normal.  . Tremor     "upper extremities; last 6-7 months" (09/28/2013)  . Unspecified hypothyroidism 12/21/2013  . Multinodular goiter summer 2015    FNA 1068/3/41Follicular lesion of undetermined significance": a follicular lesion/neoplasm can not be ruled out--Dr. GhCruzita Ledererecommended repeat  bx in 6-12 mo  . Closed right trimalleolar fracture 01/2014    Surgery/fixation hardware placed.  . Chronic diastolic CHF (congestive heart failure) 2015    Past Surgical History  Procedure Laterality Date  . Lumbar laminectomy  1991; 1996  . Ankle fracture surgery Left 08/2011    Bimalleolar fx  . Total abdominal hysterectomy w/ bilateral salpingoophorectomy  1990  . Breast biopsy Right 2008  . Breast lumpectomy Right 2008  . Cataract extraction w/ intraocular lens implant Right 08/2013  . Cardiac catheterization  2000's X 2  . Bladder tack    . Orif ankle fracture Right 02/11/2014    Procedure: OPEN REDUCTION INTERNAL FIXATION (ORIF) ANKLE FRACTURE;  Surgeon: Alta Corning, MD;  Location: [facility];  Service: Orthopedics;  Laterality: Right;  . Transthoracic echocardiogram  03/2014    Mild LVH, EF  60-65%, grade I diast dysfxn.  Mild MR.    Outpatient Prescriptions Prior to Visit  Medication Sig Dispense Refill  . albuterol (PROVENTIL HFA;VENTOLIN HFA) 108 (90 BASE) MCG/ACT inhaler Inhale 2 puffs into the lungs every 6 (six) hours as needed for wheezing or shortness of breath. 1 Inhaler 2  . albuterol (PROVENTIL) (2.5 MG/3ML) 0.083% nebulizer solution Take 3 mLs (2.5 mg total) by nebulization every 4 (four) hours as needed for wheezing or shortness of breath. 75 mL 0  . ALPRAZolam (XANAX) 0.5 MG tablet Take 1 tablet (0.5 mg total) by mouth 2 (two) times daily as needed for anxiety or sleep. (Patient taking differently: Take 0.5 mg by mouth at bedtime as needed for sleep. ) 60 tablet 5  . atorvastatin (LIPITOR) 40 MG tablet TAKE 1 TABLET BY MOUTH DAILY 30 tablet 3  . benzonatate (TESSALON) 100 MG capsule Take 1 capsule (100 mg total) by mouth 3 (three) times daily. X 10 days 30 capsule 0  . cetirizine (ZYRTEC) 10 MG tablet Take 10 mg by mouth daily as needed for allergies.    . cholecalciferol (VITAMIN D) 1000 UNITS tablet Take 1,000 Units by mouth daily.    . citalopram (CELEXA) 20 MG tablet TAKE 1 TABLET BY MOUTH EVERY DAY 30 tablet 0  . clopidogrel (PLAVIX) 75 MG tablet Take 1 tablet (75 mg total) by mouth daily. 90 tablet 3  . furosemide (LASIX) 40 MG tablet Take 1.5 tablets (60 mg total) by mouth 2 (two) times daily. 60 tablet 6  . insulin aspart (NOVOLOG) 100 UNIT/ML injection 3 U SQ qAC (Patient taking differently: Inject 3 Units into the skin 3 (three) times daily with meals. ) 10 mL   . Insulin Glargine (LANTUS) 100 UNIT/ML Solostar Pen Inject 40 Units into the skin 2 (two) times daily.     Marland Kitchen letrozole (FEMARA) 2.5 MG tablet Take 1 tablet (2.5 mg total) by mouth daily. 90 tablet 1  . levothyroxine (SYNTHROID, LEVOTHROID) 100 MCG tablet Take 1 tablet (100 mcg total) by mouth daily before breakfast. 90 tablet 1  . lisinopril (PRINIVIL,ZESTRIL) 10 MG tablet Take 1 tablet (10 mg total)  by mouth daily. 30 tablet 6  . Magnesium Oxide 500 MG (LAX) TABS Take 500 mg by mouth 2 (two) times daily.    . meclizine (ANTIVERT) 12.5 MG tablet TAKE 1 TO 2 TABLETS BY MOUTH EVERY 6 HOURS AS NEEDED FOR NAUSEA OR DIZZINESS 60 tablet 0  . morphine (MS CONTIN) 15 MG 12 hr tablet 1 tab po qAM and 2 tabs po qhs 90 tablet 0  . omeprazole (PRILOSEC) 40 MG capsule 1 tab  po bid (Patient taking differently: Take 40 mg by mouth 2 (two) times daily. ) 60 capsule 3  . Oxycodone HCl 10 MG TABS 1-2 tabs po tid prn severe pain (Patient taking differently: Take 10-20 mg by mouth 2 (two) times daily. Takes 1 tab in am and 2 tabs at bedtime) 180 tablet 0  . polyethylene glycol (MIRALAX / GLYCOLAX) packet Take 17 g by mouth daily. 14 each 0  . pregabalin (LYRICA) 150 MG capsule Take 1 capsule (150 mg total) by mouth 2 (two) times daily. 60 capsule 5  . rOPINIRole (REQUIP) 2 MG tablet 1 tab po bid (Patient taking differently: Take 2 mg by mouth 2 (two) times daily. ) 60 tablet 6  . SPIRIVA HANDIHALER 18 MCG inhalation capsule INHALE CONTENTS OF 1 CAPSULE USING HANDIHALER EVERY DAY 90 capsule 0   No facility-administered medications prior to visit.    Allergies  Allergen Reactions  . Ciprofloxacin Other (See Comments)    Unspecified--noted in old PCP's records.  . Clindamycin/Lincomycin Rash  . Fentanyl Rash    Duragesic Patch  . Indocin [Indomethacin] Rash  . Keflex [Cephalexin] Rash  . Latex Other (See Comments)    Unspecified: noted in old PCP's records.  . Penicillins Rash  . Vancomycin Rash    ROS As per HPI  PE: Blood pressure 126/71, pulse 81, temperature 98.1 F (36.7 C), temperature source Temporal, resp. rate 22, SpO2 91 %. Gen: Alert, well appearing.  Patient is oriented to person, place, time, and situation. CV: RRR, distant S1 and S2: I could essentially only hear her heart sounds when listening in L axillary area.  I can't hear any murmur or rub. LUNGS: CTA bilat, nonlabored  resps EXT: doughy edema in both LL's but just a touch of pitting edema is discernable in LLs.  No rash or tenderness. Right ankle with brace on.  LABS:    Chemistry      Component Value Date/Time   NA 138 08/02/2014 1647   K 4.5 08/02/2014 1647   CL 97 08/02/2014 1647   CO2 33* 08/02/2014 1647   BUN 36* 08/02/2014 1647   CREATININE 0.93 08/02/2014 1647      Component Value Date/Time   CALCIUM 9.3 08/02/2014 1647   ALKPHOS 106 08/02/2014 1647   AST 24 08/02/2014 1647   ALT 18 08/02/2014 1647   BILITOT 0.8 08/02/2014 1647     Lab Results  Component Value Date   WBC 7.9 08/02/2014   HGB 9.8* 08/02/2014   HCT 31.1* 08/02/2014   MCV 80.2 08/02/2014   PLT 123* 08/02/2014   Lab Results  Component Value Date   TSH 0.731 10/07/2013   Lab Results  Component Value Date   LABPROT 13.5 02/11/2014   Lab Results  Component Value Date   HGBA1C 5.9* 04/24/2014    BNPs and Hb's have been stable the last 4 months.  IMPRESSION AND PLAN:  1) Chronic SOB, with unexplained brief periods of increased SOB. This pt is exceedingly difficult to figure out. She definitely has severe COPD and no one seems to be giving any creedence to the fact that her current pulm condition is perfectly compatible with end stage COPD.  Admittedly there is lots of pressure from pt/husband/son to keep trying to find alternative possible causes.  Will check 48H holter to r/o transient dysrhythmia as cause.  Additionally, will get CT chest angio to r/o PT again (last done about 5 mo ago).  Will make sure her GFR is  still adequate before getting this imaging test. Lastly, will arrange for PFTs to be done.  She needs a pulmonologist, but she has so much going on medically that trying to fit this consult in is difficult at this time.  Will need to get her to pulm at some point in future and if all my testing for her SOB is unrevealing then I will possibly add symbicort to her COPD treatment regimen.  2) Adult  failure to thrive, likely secondary to all her comorbidities and clinical depression. Check CMET and pre-albumin today.   Add wellbutrin XL 164m qd to her citalopram.  3) Hx of right ankle trimalleolar fracture with subsequent ORIF by Dr. GBerenice Primas pt has been released from his care and she is not satisfied with the care so she desires a ortho 2nd opinion.  Ordered this consult today for [facility] orth[facility].  4) HTN, with chronic diastolic CHF: bp well controlled.  Lasix dosing increased from 40 bid to 60 mg bid almost 3 wks ago: check lytes/cr today.  I referred her to cardiology back in February this year but a hospitalization got in the way of her keeping her appt so she has to call and reschedule this (Dr. CStanford Breed.  5) Hypothyroidism: recheck TSH today.  Pt has hx of thyroid nodule, bx 12/2014 interpretation was "could not exclude thyroid malignancy" so plans are in place for f/u with her endocrinologist to get another biopsy 11/2014.  6) Pt's son is in the process of arranging a consult with a geriatrics specialist for [place] which I am in full support of. I told them that the specialist may completely take over her patient care or may just make recommendations--I am fine with either approach.  Spent 60 min with pt today, with >50% of this time spent in counseling and care coordination regarding the above problems.  An After Visit Summary was printed and given to the patient.  FOLLOW UP: Return in about 4 weeks (around 09/17/2014) for routine chronic illness f/u (needs 45 min).

## 2014-08-21 LAB — PREALBUMIN: Prealbumin: 26 mg/dL (ref 17–34)

## 2014-08-21 NOTE — Addendum Note (Signed)
Addended by: Tammi Sou on: 08/21/2014 05:57 PM   Modules accepted: Orders

## 2014-08-22 HISTORY — PX: OTHER SURGICAL HISTORY: SHX169

## 2014-08-25 ENCOUNTER — Other Ambulatory Visit: Payer: Self-pay | Admitting: Family Medicine

## 2014-08-26 ENCOUNTER — Other Ambulatory Visit: Payer: Self-pay | Admitting: *Deleted

## 2014-08-26 DIAGNOSIS — R0602 Shortness of breath: Secondary | ICD-10-CM

## 2014-08-30 ENCOUNTER — Other Ambulatory Visit: Payer: Self-pay | Admitting: *Deleted

## 2014-08-30 ENCOUNTER — Telehealth: Payer: Self-pay | Admitting: *Deleted

## 2014-08-30 MED ORDER — INSULIN GLARGINE 100 UNIT/ML SOLOSTAR PEN: 40.0000 [IU] | PEN_INJECTOR | Freq: Two times a day (BID) | SUBCUTANEOUS | Status: DC

## 2014-08-30 NOTE — Telephone Encounter (Signed)
[facility] at home health advised and voiced understanding.

## 2014-08-30 NOTE — Telephone Encounter (Signed)
No changes b/c according to [place] she is never doing well. If pt initiates a question/complaint about something then I'll consider a change but not until then.--thx

## 2014-08-30 NOTE — Telephone Encounter (Signed)
Brooke Mccullough from [facility] stating that she has noticed some changed in pt since she has started Wellbutrin. Brooke Mccullough stated that she has noticed pt has tremors in her hands and arms, weak, forgetful, decreased appetite. She wants to know if Dr. Anitra Lauth will decrease the dose or change to different medication. CB: [phone]

## 2014-08-30 NOTE — Telephone Encounter (Signed)
Fax from [facility] requesting refill for Lantus Solostar Pen take 40 units BID. LOV 08/20/14, next ov 09/17/14, last filled 11/25/13. Rx sent to pharmacy.

## 2014-08-31 ENCOUNTER — Encounter: Payer: Self-pay | Admitting: Radiology

## 2014-08-31 ENCOUNTER — Encounter (INDEPENDENT_AMBULATORY_CARE_PROVIDER_SITE_OTHER): Payer: Medicare Other

## 2014-08-31 DIAGNOSIS — R0602 Shortness of breath: Secondary | ICD-10-CM | POA: Diagnosis not present

## 2014-08-31 DIAGNOSIS — R002 Palpitations: Secondary | ICD-10-CM

## 2014-08-31 NOTE — Progress Notes (Signed)
Patient ID: Brooke Mccullough, female   DOB: 1946/03/29, 69 y.o.   MRN: [id number] [facility] 48hr holter applied

## 2014-09-03 ENCOUNTER — Ambulatory Visit (HOSPITAL_BASED_OUTPATIENT_CLINIC_OR_DEPARTMENT_OTHER): Payer: Medicare Other

## 2014-09-08 ENCOUNTER — Other Ambulatory Visit: Payer: Self-pay | Admitting: Family Medicine

## 2014-09-09 ENCOUNTER — Ambulatory Visit (HOSPITAL_BASED_OUTPATIENT_CLINIC_OR_DEPARTMENT_OTHER): Payer: Medicare Other

## 2014-09-09 ENCOUNTER — Telehealth: Payer: Self-pay | Admitting: *Deleted

## 2014-09-09 NOTE — Telephone Encounter (Signed)
Brooke Mccullough at [facility] advised and voiced understanding.

## 2014-09-09 NOTE — Telephone Encounter (Signed)
Yes, pls call them back and give verbal order to continue home health aid as per their request.-thx

## 2014-09-09 NOTE — Telephone Encounter (Signed)
Brooke Mccullough from [facility] called requesting order to continue [place] twice a week until 10/05/14. CB: [phone]. Please advise. Thanks.

## 2014-09-10 ENCOUNTER — Encounter: Payer: Self-pay | Admitting: Family Medicine

## 2014-09-17 ENCOUNTER — Ambulatory Visit: Payer: Medicare Other | Admitting: Family Medicine

## 2014-09-23 ENCOUNTER — Telehealth: Payer: Self-pay | Admitting: *Deleted

## 2014-09-23 ENCOUNTER — Other Ambulatory Visit: Payer: Self-pay | Admitting: Family Medicine

## 2014-09-23 ENCOUNTER — Ambulatory Visit (HOSPITAL_BASED_OUTPATIENT_CLINIC_OR_DEPARTMENT_OTHER)
Admission: RE | Admit: 2014-09-23 | Discharge: 2014-09-23 | Disposition: A | Discharge status: Home / Self Care | Payer: Medicare Other | Source: Ambulatory Visit | Attending: Family Medicine | Admitting: Family Medicine

## 2014-09-23 ENCOUNTER — Encounter (HOSPITAL_BASED_OUTPATIENT_CLINIC_OR_DEPARTMENT_OTHER): Payer: Self-pay

## 2014-09-23 DIAGNOSIS — S82851G Displaced trimalleolar fracture of right lower leg, subsequent encounter for closed fracture with delayed healing: Secondary | ICD-10-CM

## 2014-09-23 DIAGNOSIS — J984 Other disorders of lung: Secondary | ICD-10-CM | POA: Diagnosis not present

## 2014-09-23 DIAGNOSIS — R0602 Shortness of breath: Secondary | ICD-10-CM | POA: Diagnosis present

## 2014-09-23 DIAGNOSIS — D3501 Benign neoplasm of right adrenal gland: Secondary | ICD-10-CM | POA: Diagnosis not present

## 2014-09-23 DIAGNOSIS — R938 Abnormal findings on diagnostic imaging of other specified body structures: Secondary | ICD-10-CM | POA: Diagnosis not present

## 2014-09-23 DIAGNOSIS — Q791 Other congenital malformations of diaphragm: Secondary | ICD-10-CM | POA: Diagnosis not present

## 2014-09-23 DIAGNOSIS — R002 Palpitations: Secondary | ICD-10-CM

## 2014-09-23 DIAGNOSIS — R5383 Other fatigue: Secondary | ICD-10-CM

## 2014-09-23 MED ORDER — IOHEXOL 350 MG/ML SOLN
100.0000 mL | Freq: Once | INTRAVENOUS | Status: AC | PRN
  Administered 2014-09-23: 100 mL via INTRAVENOUS

## 2014-09-23 MED ORDER — CLARITHROMYCIN 500 MG PO TABS: ORAL_TABLET | ORAL | Status: DC

## 2014-09-23 NOTE — Telephone Encounter (Signed)
Pls notify pt that her CT scan of chest did not show any blood clots or fluid in her lungs. It did show a few small areas of pneumonia on one side.  This could be either from a virus or a bacteria. I will treat as if it is bacteria: tell her I have sent in an antibiotic to her pharmacy for her to take for 10 days (clarithromycin--reassure her it is not on her allergy list and is not related to any of the antibiotics she is allergic to).-thx

## 2014-09-23 NOTE — Telephone Encounter (Signed)
Adam from [facility] called with report of chest CT. Report: No evidence of PE. Elevated right hemidiaphragm with probable subsegmental atelectasis seen in right middle lobe. Ill defined small densities are noted in right upper lobe. Concerning for possible pneumonia or inflammatory. Stable right adrenal adenoma 2.5cm solid density is seen posterior to right thyroid lobe. Concerning for thyroid nodule. Thyroid U/S recommended for evaluation.

## 2014-09-24 NOTE — Telephone Encounter (Signed)
Pt advised and voiced understanding.   

## 2014-09-26 ENCOUNTER — Other Ambulatory Visit: Payer: Self-pay | Admitting: Family Medicine

## 2014-09-29 ENCOUNTER — Emergency Department (HOSPITAL_COMMUNITY): Payer: Medicare Other

## 2014-09-29 ENCOUNTER — Inpatient Hospital Stay (HOSPITAL_COMMUNITY)
Admission: EM | Admit: 2014-09-29 | Discharge: 2014-10-02 | DRG: 291 | Disposition: A | Discharge status: Home Health | Payer: Medicare Other | Attending: Family Medicine | Admitting: Family Medicine

## 2014-09-29 ENCOUNTER — Encounter (HOSPITAL_COMMUNITY): Payer: Self-pay | Admitting: Family Medicine

## 2014-09-29 DIAGNOSIS — S82851G Displaced trimalleolar fracture of right lower leg, subsequent encounter for closed fracture with delayed healing: Secondary | ICD-10-CM

## 2014-09-29 DIAGNOSIS — Z8673 Personal history of transient ischemic attack (TIA), and cerebral infarction without residual deficits: Secondary | ICD-10-CM | POA: Diagnosis not present

## 2014-09-29 DIAGNOSIS — K5900 Constipation, unspecified: Secondary | ICD-10-CM | POA: Diagnosis not present

## 2014-09-29 DIAGNOSIS — E1140 Type 2 diabetes mellitus with diabetic neuropathy, unspecified: Secondary | ICD-10-CM | POA: Diagnosis present

## 2014-09-29 DIAGNOSIS — R251 Tremor, unspecified: Secondary | ICD-10-CM | POA: Diagnosis present

## 2014-09-29 DIAGNOSIS — M479 Spondylosis, unspecified: Secondary | ICD-10-CM | POA: Diagnosis present

## 2014-09-29 DIAGNOSIS — E039 Hypothyroidism, unspecified: Secondary | ICD-10-CM | POA: Diagnosis present

## 2014-09-29 DIAGNOSIS — Z886 Allergy status to analgesic agent status: Secondary | ICD-10-CM | POA: Diagnosis not present

## 2014-09-29 DIAGNOSIS — Z794 Long term (current) use of insulin: Secondary | ICD-10-CM | POA: Diagnosis not present

## 2014-09-29 DIAGNOSIS — R627 Adult failure to thrive: Secondary | ICD-10-CM | POA: Diagnosis present

## 2014-09-29 DIAGNOSIS — M109 Gout, unspecified: Secondary | ICD-10-CM | POA: Diagnosis present

## 2014-09-29 DIAGNOSIS — Z79891 Long term (current) use of opiate analgesic: Secondary | ICD-10-CM

## 2014-09-29 DIAGNOSIS — Z79899 Other long term (current) drug therapy: Secondary | ICD-10-CM

## 2014-09-29 DIAGNOSIS — Z8249 Family history of ischemic heart disease and other diseases of the circulatory system: Secondary | ICD-10-CM

## 2014-09-29 DIAGNOSIS — Z7902 Long term (current) use of antithrombotics/antiplatelets: Secondary | ICD-10-CM

## 2014-09-29 DIAGNOSIS — E119 Type 2 diabetes mellitus without complications: Secondary | ICD-10-CM

## 2014-09-29 DIAGNOSIS — Z91040 Latex allergy status: Secondary | ICD-10-CM

## 2014-09-29 DIAGNOSIS — Z6838 Body mass index (BMI) 38.0-38.9, adult: Secondary | ICD-10-CM | POA: Diagnosis not present

## 2014-09-29 DIAGNOSIS — I130 Hypertensive heart and chronic kidney disease with heart failure and stage 1 through stage 4 chronic kidney disease, or unspecified chronic kidney disease: Secondary | ICD-10-CM | POA: Diagnosis present

## 2014-09-29 DIAGNOSIS — I5032 Chronic diastolic (congestive) heart failure: Secondary | ICD-10-CM | POA: Diagnosis not present

## 2014-09-29 DIAGNOSIS — Z853 Personal history of malignant neoplasm of breast: Secondary | ICD-10-CM | POA: Diagnosis not present

## 2014-09-29 DIAGNOSIS — J9811 Atelectasis: Secondary | ICD-10-CM | POA: Diagnosis present

## 2014-09-29 DIAGNOSIS — K589 Irritable bowel syndrome without diarrhea: Secondary | ICD-10-CM | POA: Diagnosis present

## 2014-09-29 DIAGNOSIS — Z880 Allergy status to penicillin: Secondary | ICD-10-CM | POA: Diagnosis not present

## 2014-09-29 DIAGNOSIS — N179 Acute kidney failure, unspecified: Secondary | ICD-10-CM | POA: Diagnosis present

## 2014-09-29 DIAGNOSIS — D631 Anemia in chronic kidney disease: Secondary | ICD-10-CM | POA: Diagnosis present

## 2014-09-29 DIAGNOSIS — Z803 Family history of malignant neoplasm of breast: Secondary | ICD-10-CM

## 2014-09-29 DIAGNOSIS — R531 Weakness: Secondary | ICD-10-CM

## 2014-09-29 DIAGNOSIS — M797 Fibromyalgia: Secondary | ICD-10-CM | POA: Diagnosis present

## 2014-09-29 DIAGNOSIS — I872 Venous insufficiency (chronic) (peripheral): Secondary | ICD-10-CM | POA: Diagnosis present

## 2014-09-29 DIAGNOSIS — Z87891 Personal history of nicotine dependence: Secondary | ICD-10-CM

## 2014-09-29 DIAGNOSIS — E1122 Type 2 diabetes mellitus with diabetic chronic kidney disease: Secondary | ICD-10-CM | POA: Diagnosis present

## 2014-09-29 DIAGNOSIS — G43909 Migraine, unspecified, not intractable, without status migrainosus: Secondary | ICD-10-CM | POA: Diagnosis present

## 2014-09-29 DIAGNOSIS — M545 Low back pain: Secondary | ICD-10-CM | POA: Diagnosis present

## 2014-09-29 DIAGNOSIS — Z885 Allergy status to narcotic agent status: Secondary | ICD-10-CM | POA: Diagnosis not present

## 2014-09-29 DIAGNOSIS — D696 Thrombocytopenia, unspecified: Secondary | ICD-10-CM | POA: Diagnosis present

## 2014-09-29 DIAGNOSIS — J181 Lobar pneumonia, unspecified organism: Secondary | ICD-10-CM | POA: Diagnosis present

## 2014-09-29 DIAGNOSIS — M19021 Primary osteoarthritis, right elbow: Secondary | ICD-10-CM | POA: Diagnosis present

## 2014-09-29 DIAGNOSIS — N183 Chronic kidney disease, stage 3 unspecified: Secondary | ICD-10-CM | POA: Diagnosis present

## 2014-09-29 DIAGNOSIS — M7989 Other specified soft tissue disorders: Secondary | ICD-10-CM | POA: Diagnosis not present

## 2014-09-29 DIAGNOSIS — G2581 Restless legs syndrome: Secondary | ICD-10-CM | POA: Diagnosis present

## 2014-09-29 DIAGNOSIS — M19072 Primary osteoarthritis, left ankle and foot: Secondary | ICD-10-CM | POA: Diagnosis present

## 2014-09-29 DIAGNOSIS — J9611 Chronic respiratory failure with hypoxia: Secondary | ICD-10-CM | POA: Diagnosis present

## 2014-09-29 DIAGNOSIS — Z881 Allergy status to other antibiotic agents status: Secondary | ICD-10-CM

## 2014-09-29 DIAGNOSIS — I5033 Acute on chronic diastolic (congestive) heart failure: Secondary | ICD-10-CM | POA: Diagnosis not present

## 2014-09-29 DIAGNOSIS — Z9981 Dependence on supplemental oxygen: Secondary | ICD-10-CM

## 2014-09-29 DIAGNOSIS — M19022 Primary osteoarthritis, left elbow: Secondary | ICD-10-CM | POA: Diagnosis present

## 2014-09-29 DIAGNOSIS — X58XXXD Exposure to other specified factors, subsequent encounter: Secondary | ICD-10-CM | POA: Diagnosis present

## 2014-09-29 DIAGNOSIS — J9612 Chronic respiratory failure with hypercapnia: Secondary | ICD-10-CM

## 2014-09-29 DIAGNOSIS — M170 Bilateral primary osteoarthritis of knee: Secondary | ICD-10-CM | POA: Diagnosis present

## 2014-09-29 DIAGNOSIS — S82851A Displaced trimalleolar fracture of right lower leg, initial encounter for closed fracture: Secondary | ICD-10-CM | POA: Diagnosis present

## 2014-09-29 DIAGNOSIS — I10 Essential (primary) hypertension: Secondary | ICD-10-CM | POA: Diagnosis present

## 2014-09-29 DIAGNOSIS — E785 Hyperlipidemia, unspecified: Secondary | ICD-10-CM | POA: Diagnosis present

## 2014-09-29 DIAGNOSIS — E0520 Thyrotoxicosis with toxic multinodular goiter without thyrotoxic crisis or storm: Secondary | ICD-10-CM | POA: Diagnosis present

## 2014-09-29 DIAGNOSIS — J449 Chronic obstructive pulmonary disease, unspecified: Secondary | ICD-10-CM | POA: Diagnosis present

## 2014-09-29 DIAGNOSIS — G894 Chronic pain syndrome: Secondary | ICD-10-CM | POA: Diagnosis present

## 2014-09-29 DIAGNOSIS — N189 Chronic kidney disease, unspecified: Secondary | ICD-10-CM | POA: Diagnosis not present

## 2014-09-29 HISTORY — DX: Personal history of other diseases of the digestive system: Z87.19

## 2014-09-29 HISTORY — DX: Malignant neoplasm of unspecified site of right female breast: C50.911

## 2014-09-29 LAB — CBC
HEMATOCRIT: 29.4 % — AB (ref 36.0–46.0)
Hemoglobin: 9.4 g/dL — ABNORMAL LOW (ref 12.0–15.0)
MCH: 24.9 pg — ABNORMAL LOW (ref 26.0–34.0)
MCHC: 32 g/dL (ref 30.0–36.0)
MCV: 78 fL (ref 78.0–100.0)
PLATELETS: 93 10*3/uL — AB (ref 150–400)
RBC: 3.77 MIL/uL — ABNORMAL LOW (ref 3.87–5.11)
RDW: 14.5 % (ref 11.5–15.5)
WBC: 13.3 10*3/uL — ABNORMAL HIGH (ref 4.0–10.5)

## 2014-09-29 LAB — URINALYSIS, ROUTINE W REFLEX MICROSCOPIC
Bilirubin Urine: NEGATIVE
Glucose, UA: NEGATIVE mg/dL
Hgb urine dipstick: NEGATIVE
Ketones, ur: NEGATIVE mg/dL
Nitrite: NEGATIVE
PROTEIN: NEGATIVE mg/dL
Specific Gravity, Urine: 1.008 (ref 1.005–1.030)
Urobilinogen, UA: 0.2 mg/dL (ref 0.0–1.0)
pH: 5.5 (ref 5.0–8.0)

## 2014-09-29 LAB — BASIC METABOLIC PANEL
Anion gap: 9 (ref 5–15)
BUN: 62 mg/dL — ABNORMAL HIGH (ref 6–20)
CALCIUM: 9.2 mg/dL (ref 8.9–10.3)
CO2: 32 mmol/L (ref 22–32)
CREATININE: 1.58 mg/dL — AB (ref 0.44–1.00)
Chloride: 94 mmol/L — ABNORMAL LOW (ref 101–111)
GFR calc Af Amer: 37 mL/min — ABNORMAL LOW (ref >60)
GFR calc non Af Amer: 32 mL/min — ABNORMAL LOW (ref >60)
Glucose, Bld: 106 mg/dL — ABNORMAL HIGH (ref 65–99)
Potassium: 4.9 mmol/L (ref 3.5–5.1)
Sodium: 135 mmol/L (ref 135–145)

## 2014-09-29 LAB — CBG MONITORING, ED: GLUCOSE-CAPILLARY: 73 mg/dL (ref 65–99)

## 2014-09-29 LAB — GLUCOSE, CAPILLARY: Glucose-Capillary: 143 mg/dL — ABNORMAL HIGH (ref 65–99)

## 2014-09-29 LAB — URINE MICROSCOPIC-ADD ON

## 2014-09-29 LAB — BRAIN NATRIURETIC PEPTIDE: B Natriuretic Peptide: 48 pg/mL (ref 0.0–100.0)

## 2014-09-29 LAB — TSH: TSH: 0.742 u[IU]/mL (ref 0.350–4.500)

## 2014-09-29 LAB — TROPONIN I: Troponin I: <0.03 ng/mL (ref <0.031)

## 2014-09-29 MED ORDER — OXYCODONE-ACETAMINOPHEN 5-325 MG PO TABS
1.0000 | ORAL_TABLET | Freq: Four times a day (QID) | ORAL | Status: DC | PRN
  Administered 2014-09-30 – 2014-10-02 (×6): 1 via ORAL
  Filled 2014-09-29 (×6): qty 1

## 2014-09-29 MED ORDER — INSULIN GLARGINE 100 UNIT/ML ~~LOC~~ SOLN
40.0000 [IU] | Freq: Two times a day (BID) | SUBCUTANEOUS | Status: DC
  Administered 2014-09-29 – 2014-10-02 (×6): 40 [IU] via SUBCUTANEOUS
  Filled 2014-09-29 (×8): qty 0.4

## 2014-09-29 MED ORDER — MORPHINE SULFATE ER 15 MG PO TBCR
15.0000 mg | EXTENDED_RELEASE_TABLET | Freq: Once | ORAL | Status: AC
  Administered 2014-09-30: 15 mg via ORAL
  Filled 2014-09-29: qty 1

## 2014-09-29 MED ORDER — CITALOPRAM HYDROBROMIDE 20 MG PO TABS
20.0000 mg | ORAL_TABLET | Freq: Every day | ORAL | Status: DC
  Administered 2014-09-30 – 2014-10-02 (×3): 20 mg via ORAL
  Filled 2014-09-29 (×3): qty 1

## 2014-09-29 MED ORDER — LORATADINE 10 MG PO TABS
10.0000 mg | ORAL_TABLET | Freq: Every day | ORAL | Status: DC
  Administered 2014-09-29 – 2014-10-02 (×4): 10 mg via ORAL
  Filled 2014-09-29 (×4): qty 1

## 2014-09-29 MED ORDER — PREGABALIN 75 MG PO CAPS
150.0000 mg | ORAL_CAPSULE | Freq: Two times a day (BID) | ORAL | Status: DC
  Administered 2014-09-29 – 2014-10-02 (×6): 150 mg via ORAL
  Filled 2014-09-29 (×6): qty 2

## 2014-09-29 MED ORDER — SODIUM CHLORIDE 0.9 % IV SOLN
INTRAVENOUS | Status: DC
  Administered 2014-09-29: 15:00:00 via INTRAVENOUS

## 2014-09-29 MED ORDER — MECLIZINE HCL 12.5 MG PO TABS
12.5000 mg | ORAL_TABLET | Freq: Three times a day (TID) | ORAL | Status: DC | PRN
  Filled 2014-09-29: qty 1

## 2014-09-29 MED ORDER — PANTOPRAZOLE SODIUM 40 MG PO TBEC
40.0000 mg | DELAYED_RELEASE_TABLET | Freq: Every day | ORAL | Status: DC
  Administered 2014-09-30: 40 mg via ORAL
  Filled 2014-09-29: qty 1

## 2014-09-29 MED ORDER — ALBUTEROL SULFATE (2.5 MG/3ML) 0.083% IN NEBU: 2.5000 mg | INHALATION_SOLUTION | RESPIRATORY_TRACT | Status: DC | PRN

## 2014-09-29 MED ORDER — ALUM & MAG HYDROXIDE-SIMETH 200-200-20 MG/5ML PO SUSP: 30.0000 mL | Freq: Four times a day (QID) | ORAL | Status: DC | PRN

## 2014-09-29 MED ORDER — MAGNESIUM OXIDE (LAXATIVE) 500 MG PO TABS
1000.0000 mg | ORAL_TABLET | Freq: Every day | ORAL | Status: DC
  Filled 2014-09-29 (×3): qty 2

## 2014-09-29 MED ORDER — MORPHINE SULFATE ER 30 MG PO TBCR: 30.0000 mg | EXTENDED_RELEASE_TABLET | Freq: Every day | ORAL | Status: DC

## 2014-09-29 MED ORDER — MORPHINE SULFATE ER 30 MG PO TBCR
30.0000 mg | EXTENDED_RELEASE_TABLET | Freq: Every day | ORAL | Status: DC
  Administered 2014-09-30 – 2014-10-01 (×2): 30 mg via ORAL
  Filled 2014-09-29 (×2): qty 1

## 2014-09-29 MED ORDER — ATORVASTATIN CALCIUM 40 MG PO TABS
40.0000 mg | ORAL_TABLET | Freq: Every day | ORAL | Status: DC
  Administered 2014-09-30 – 2014-10-02 (×3): 40 mg via ORAL
  Filled 2014-09-29 (×3): qty 1

## 2014-09-29 MED ORDER — LEVOTHYROXINE SODIUM 100 MCG PO TABS
100.0000 ug | ORAL_TABLET | Freq: Every day | ORAL | Status: DC
  Administered 2014-09-30 – 2014-10-02 (×3): 100 ug via ORAL
  Filled 2014-09-29 (×3): qty 1

## 2014-09-29 MED ORDER — ROPINIROLE HCL 1 MG PO TABS
2.0000 mg | ORAL_TABLET | Freq: Two times a day (BID) | ORAL | Status: DC
  Administered 2014-09-29 – 2014-10-02 (×6): 2 mg via ORAL
  Filled 2014-09-29 (×2): qty 2
  Filled 2014-09-29: qty 4
  Filled 2014-09-29 (×2): qty 2
  Filled 2014-09-29: qty 4

## 2014-09-29 MED ORDER — ONDANSETRON HCL 4 MG/2ML IJ SOLN: 4.0000 mg | Freq: Four times a day (QID) | INTRAMUSCULAR | Status: DC | PRN

## 2014-09-29 MED ORDER — CLOPIDOGREL BISULFATE 75 MG PO TABS
75.0000 mg | ORAL_TABLET | Freq: Every day | ORAL | Status: DC
  Administered 2014-09-30 – 2014-10-02 (×3): 75 mg via ORAL
  Filled 2014-09-29 (×3): qty 1

## 2014-09-29 MED ORDER — INSULIN ASPART 100 UNIT/ML ~~LOC~~ SOLN: 0.0000 [IU] | Freq: Every day | SUBCUTANEOUS | Status: DC

## 2014-09-29 MED ORDER — CLARITHROMYCIN 500 MG PO TABS
500.0000 mg | ORAL_TABLET | Freq: Every day | ORAL | Status: DC
  Administered 2014-09-29 – 2014-10-02 (×4): 500 mg via ORAL
  Filled 2014-09-29 (×4): qty 1

## 2014-09-29 MED ORDER — ONDANSETRON HCL 4 MG PO TABS: 4.0000 mg | ORAL_TABLET | Freq: Four times a day (QID) | ORAL | Status: DC | PRN

## 2014-09-29 MED ORDER — ALBUTEROL SULFATE HFA 108 (90 BASE) MCG/ACT IN AERS: 2.0000 | INHALATION_SPRAY | Freq: Four times a day (QID) | RESPIRATORY_TRACT | Status: DC | PRN

## 2014-09-29 MED ORDER — MORPHINE SULFATE ER 15 MG PO TBCR: 15.0000 mg | EXTENDED_RELEASE_TABLET | Freq: Every morning | ORAL | Status: DC

## 2014-09-29 MED ORDER — MAGNESIUM OXIDE 400 (241.3 MG) MG PO TABS
1000.0000 mg | ORAL_TABLET | Freq: Every day | ORAL | Status: DC
  Administered 2014-09-29 – 2014-10-02 (×4): 1000 mg via ORAL
  Filled 2014-09-29 (×4): qty 2.5

## 2014-09-29 MED ORDER — LETROZOLE 2.5 MG PO TABS
2.5000 mg | ORAL_TABLET | Freq: Every day | ORAL | Status: DC
  Administered 2014-09-30 – 2014-10-02 (×3): 2.5 mg via ORAL
  Filled 2014-09-29 (×3): qty 1

## 2014-09-29 MED ORDER — TIOTROPIUM BROMIDE MONOHYDRATE 18 MCG IN CAPS
18.0000 ug | ORAL_CAPSULE | Freq: Every day | RESPIRATORY_TRACT | Status: DC
  Administered 2014-09-29 – 2014-10-02 (×4): 18 ug via RESPIRATORY_TRACT
  Filled 2014-09-29: qty 5

## 2014-09-29 MED ORDER — SODIUM CHLORIDE 0.9 % IV BOLUS (SEPSIS)
500.0000 mL | Freq: Once | INTRAVENOUS | Status: AC
  Administered 2014-09-29: 500 mL via INTRAVENOUS

## 2014-09-29 MED ORDER — INSULIN ASPART 100 UNIT/ML ~~LOC~~ SOLN
0.0000 [IU] | Freq: Three times a day (TID) | SUBCUTANEOUS | Status: DC
  Administered 2014-09-30: 5 [IU] via SUBCUTANEOUS
  Administered 2014-09-30: 1 [IU] via SUBCUTANEOUS
  Administered 2014-10-01: 2 [IU] via SUBCUTANEOUS
  Administered 2014-10-01: 3 [IU] via SUBCUTANEOUS
  Administered 2014-10-02: 2 [IU] via SUBCUTANEOUS
  Administered 2014-10-02: 1 [IU] via SUBCUTANEOUS

## 2014-09-29 MED ORDER — ALPRAZOLAM 0.5 MG PO TABS
0.5000 mg | ORAL_TABLET | Freq: Every evening | ORAL | Status: DC | PRN
  Administered 2014-10-02: 0.5 mg via ORAL
  Filled 2014-09-29: qty 1

## 2014-09-29 MED ORDER — MORPHINE SULFATE ER 15 MG PO TBCR
15.0000 mg | EXTENDED_RELEASE_TABLET | Freq: Two times a day (BID) | ORAL | Status: DC
  Administered 2014-09-29: 15 mg via ORAL
  Filled 2014-09-29: qty 1

## 2014-09-29 NOTE — ED Notes (Signed)
CBG [phone] 

## 2014-09-29 NOTE — ED Notes (Signed)
Attempted report x1. 

## 2014-09-29 NOTE — ED Notes (Signed)
Pt placed on hospital bed for comfort.

## 2014-09-29 NOTE — H&P (Addendum)
[facility] History and Physical  Brooke Mccullough KYH:[id number] DOB: Apr 28, 1945 DOA: 09/29/2014  Referring physician: ER physician: Dr. Ilona Sorrel PCP: Tammi Sou, MD  Chief Complaint: weakness, tremors   HPI: Pt seen and examined at the bedside. I have reviewed H/P done by NP. Agree with NP review and A/P.   69 year old female with multiple medical comorbiditeis including COPD, chronic diastolic CHF grade 1, no healing right ankle fracture, fibromyalgia and related chronic pain, obesity who presented to [facility] ED with progressive weakness and ongoing tremors over past 1 months prior to this admission. Patient who is at the patient's bedside acknowledges the tremors have gotten worse over past few days and pt could barely bear weight and/or ambulate. No apparent fevers at home. No chest pain or palpitations. She does have chronic lower extremity swelling. No abdominal pian, nausea or vomiting. No diarrhea. No falls or loss of consciousness.  In ED, pt was patient was hemodynamically stable. Her blood work reveled WBC count of 13.3, hemoglobin of 9.4, platelets 93, creatinine 1.58 (baseline 0.93), normal troponin level. Chest x-ray revealed increased right basilar subsegmental atelectasis consistent with recent diagnosis of right-sided pneumonia. CT of the head showed no acute abnormalities. She was admitted for further evaluation and management.  Assessment & Plan    Principal Problem: Tremors / Weakness - Unclear etiology - Possibly related to fibromyalgia - Obtain PT evaluation  Active Problems: Acute renal failure on chronic renal insufficiency, stage III (moderate) - Possibly from lasix, or ace inhibitor which were placed on hold at the time of the admission - Continue to monitor renal function - Will stop IV fluids due to concern for worsening LE edema. She has received IV fluids in ED - Follow up BMP tomorrow am  Chronic respiratory failure with hypoxia / /COPD /  Unspecified lobar pneumonia - Continue preadmission clarithromycin started several days ago for pneumonia process seen on outpatient CT of the chest - Respiratory status stable.   HTN (hypertension), benign - BP soft on admission so BP meds on hold     Chronic diastolic heart failure, NYHA class 1 - Compensated - Lasix on hold due to renal insufficiency  Diabetes type 2, controlled with renal manifestations  - Continue insulin per home regimen   Fibromyalgia/Chronic pain syndrome - Continue home medications  Hypothyroidism - Continue Synthroid  Closed right trimalleolar fracture - Pt has been utilizing limited weightbearing with walker and PT at home  Thrombocytopenia (chronic) - Platelets less than 100,000 with noted baseline 120,000  - No reports of bleeding - Using SCD"s for DVT prophylaxis  Obesity in patient with multiple comorbidities (Diabetes, hypertension, dyslipidemia) - Nutrition consulted  Dyslipidemia - Continue statin therapy      DVT prophylaxis:  - SCD's bilaterally   Radiological Exams on Admission: Ct Head Wo Contrast 09/29/2014   No acute abnormalities identified. Stable mild atrophy, small vessel disease and right basal ganglia lacunar infarct.   Electronically Signed   By: Aletta Edouard M.D.   On: 09/29/2014 12:29   Dg Chest Port 1 View 09/29/2014   Increased right basilar subsegmental atelectasis.  Stable cardiomegaly and chronic elevation of right hemidiaphragm.   Electronically Signed   By: [name] M.D.   On: 09/29/2014 11:35   Code Status: Full Family Communication: Plan of care discussed with the patient  Disposition Plan: Admit for further evaluation  Leisa Lenz, MD  [facility] Pager [phone]  Time spent in minutes: 55 minutes  Review of Systems:  Constitutional: Negative for fever, chills and malaise/fatigue. Negative for diaphoresis.  HENT: Negative for hearing loss, ear pain, nosebleeds, congestion, sore throat, neck  pain, tinnitus and ear discharge.   Eyes: Negative for blurred vision, double vision, photophobia, pain, discharge and redness.  Respiratory: Negative for cough, hemoptysis, sputum production, shortness of breath, wheezing and stridor.   Cardiovascular: Negative for chest pain, palpitations, orthopnea, claudication and positive for leg swelling.  Gastrointestinal: Negative for nausea, vomiting and abdominal pain. Negative for heartburn, constipation, blood in stool and melena.  Genitourinary: Negative for dysuria, urgency, frequency, hematuria and flank pain.  Musculoskeletal: Negative for myalgias, back pain, joint pain and falls.  Skin: Negative for itching and rash.  Neurological: per HPI  Endo/Heme/Allergies: Negative for environmental allergies and polydipsia. Does not bruise/bleed easily.  Psychiatric/Behavioral: Negative for suicidal ideas. The patient is not nervous/anxious.      Past Medical History  Diagnosis Date  . COPD (chronic obstructive pulmonary disease)     nocturnal oxygen, plus daytime use with activity  . IBS (irritable bowel syndrome)   . Edema   . Gallstones     asymptomatic per pt (as of 09/30/2013); mild chronic elevation of Alk phos (remainder of hepatic panel wnl).  . Hernia of abdominal wall   . Hypertension   . Hyperlipidemia   . On home oxygen therapy     "3.4 depending on how I feel; 24/7" (09/29/2014)  . Adrenal benign tumor   . Anemia 2015    Baseline Hb 11, normocytic.  Marland Kitchen GERD (gastroesophageal reflux disease)   . History of duodenal ulcer   . TIA (transient ischemic attack) 2012; 2014    left facial and left arm numbness since  . Chronic lower back pain     "L5-S1" (09/29/2014)  . Gout   . Anxiety   . Depression   . Tremor     "upper extremities; last 6-7 months" (09/28/2013)  . Multinodular goiter summer 2015    FNA 97/6/73 "Follicular lesion of undetermined significance": a follicular lesion/neoplasm can not be ruled out--Dr. Cruzita Lederer recommended  repeat bx in 6-12 mo  . Chronic diastolic CHF (congestive heart failure) 2015  . Pneumonia "once"  . Closed right trimalleolar fracture     Surgery/fixation hardware placed.  Marland Kitchen Unspecified hypothyroidism 12/21/2013  . Type II diabetes mellitus     hx of good control per pt report + HbA1c <7% June 2015.  Marland Kitchen History of hiatal hernia   . Migraine     "@ least monthly" (09/29/2014)  . Degenerative disc disease     Cervical and lumbar  . Osteoarthritis (arthritis due to wear and tear of joints)     "back; knees; elbows; left ankle" (09/29/2014)  . Fibromyalgia     Dx'd 1993.  This has impaired her significantly.  . Cancer of right breast 2008    Lumpectomy + radiation+aromatase inhibitor (femara).  Regular f/u and mammos normal.   Past Surgical History  Procedure Laterality Date  . Lumbar laminectomy  1991; 1996  . Orif ankle fracture bimalleolar Left 08/2011  . Breast biopsy Right 2008  . Breast lumpectomy Right 2008  . Cataract extraction w/ intraocular lens implant Right 08/2013  . Cardiac catheterization  2000's X 2  . Combined hysterectomy vaginal / oophorectomy / a&p repair / sacrospinous ligament suspension  03/1989  . Orif ankle fracture Right 02/11/2014    Procedure: OPEN REDUCTION INTERNAL FIXATION (ORIF) ANKLE FRACTURE;  Surgeon: Alta Corning, MD;  Location: [place];  Service: Orthopedics;  Laterality: Right;  . Transthoracic echocardiogram  03/2014    Mild LVH, EF 60-65%, grade I diast dysfxn.  Mild MR.  . Holter monitor  08/2014    NORMAL  . Back surgery    . Fracture surgery    . Vaginal hysterectomy    . Biopsy thyroid  05/2014    "sample wasn't good; will wait 6 months and repeat biopsy"   Social History:  reports that she quit smoking about 12 months ago. Her smoking use included Cigarettes. She has a 36 pack-year smoking history. She has never used smokeless tobacco. She reports that she does not drink alcohol or use illicit drugs.  Allergies  Allergen Reactions  .  Ciprofloxacin Other (See Comments)    Unspecified--noted in old PCP's records.  . Clindamycin/Lincomycin Rash  . Fentanyl Rash    Duragesic Patch  . Indocin [Indomethacin] Rash  . Keflex [Cephalexin] Rash  . Latex Other (See Comments)    Unspecified: noted in old PCP's records.  . Penicillins Rash  . Vancomycin Rash    Family History:  Family History  Problem Relation Age of Onset  . Arthritis Mother   . Breast cancer Mother   . Hyperlipidemia Mother   . Stroke Mother   . Hypertension Mother   . Mental illness Mother   . Arthritis Father   . Cancer Father   . Hyperlipidemia Father   . Heart disease Father   . Heart disease Maternal Grandmother   . Hyperlipidemia Maternal Grandmother   . Heart disease Paternal Grandmother   . Hypertension Paternal Grandmother   . Diabetes Paternal Grandmother   . Arthritis Paternal Grandfather      Prior to Admission medications   Medication Sig Start Date End Date Taking? Authorizing Provider  albuterol (PROVENTIL HFA;VENTOLIN HFA) 108 (90 BASE) MCG/ACT inhaler Inhale 2 puffs into the lungs every 6 (six) hours as needed for wheezing or shortness of breath. 10/10/13  Yes Domenic Polite, MD  albuterol (PROVENTIL) (2.5 MG/3ML) 0.083% nebulizer solution Take 3 mLs (2.5 mg total) by nebulization every 4 (four) hours as needed for wheezing or shortness of breath. 01/18/14  Yes Tammi Sou, MD  ALPRAZolam Duanne Moron) 0.5 MG tablet Take 1 tablet (0.5 mg total) by mouth 2 (two) times daily as needed for anxiety or sleep. Patient taking differently: Take 0.5 mg by mouth at bedtime as needed for sleep.  07/06/14  Yes Tammi Sou, MD  atorvastatin (LIPITOR) 40 MG tablet TAKE 1 TABLET BY MOUTH DAILY 07/20/14  Yes Tammi Sou, MD  cetirizine (ZYRTEC) 10 MG tablet Take 10 mg by mouth daily as needed for allergies.   Yes Historical Provider, MD  cholecalciferol (VITAMIN D) 1000 UNITS tablet Take 1,000 Units by mouth daily.   Yes Historical Provider,  MD  citalopram (CELEXA) 20 MG tablet TAKE 1 TABLET BY MOUTH EVERY DAY 08/25/14  Yes Tammi Sou, MD  clarithromycin (BIAXIN) 500 MG tablet 1 tab po bid x 10 days 09/23/14  Yes Tammi Sou, MD  clopidogrel (PLAVIX) 75 MG tablet Take 1 tablet (75 mg total) by mouth daily. 06/17/14  Yes Tammi Sou, MD  furosemide (LASIX) 40 MG tablet Take 1.5 tablets (60 mg total) by mouth 2 (two) times daily. 08/02/14  Yes Doy Hutching, MD  insulin aspart (NOVOLOG) 100 UNIT/ML injection 3 U SQ qAC Patient taking differently: Inject 3 Units into the skin 3 (three) times daily with meals.  06/17/14  Yes Adrian Blackwater  McGowen, MD  Insulin Glargine (LANTUS) 100 UNIT/ML Solostar Pen Inject 40 Units into the skin 2 (two) times daily. 08/30/14  Yes Tammi Sou, MD  letrozole [facility]) 2.5 MG tablet TAKE 1 TABLET BY MOUTH EVERY DAY 09/27/14  Yes Tammi Sou, MD  levothyroxine (SYNTHROID, LEVOTHROID) 100 MCG tablet Take 1 tablet (100 mcg total) by mouth daily before breakfast. 02/09/14  Yes Tammi Sou, MD  lisinopril (PRINIVIL,ZESTRIL) 10 MG tablet Take 1 tablet (10 mg total) by mouth daily. 11/12/13  Yes Tammi Sou, MD  Magnesium Oxide 500 MG (LAX) TABS Take 1,000 mg by mouth daily.    Yes Historical Provider, MD  meclizine (ANTIVERT) 12.5 MG tablet TAKE 1 TO 2 TABLETS BY MOUTH EVERY 6 HOURS AS NEEDED FOR NAUSEA OR DIZZINESS 09/08/14  Yes Tammi Sou, MD  morphine (MS CONTIN) 15 MG 12 hr tablet 1 tab po qAM and 2 tabs po qhs 08/06/14  Yes Tammi Sou, MD  Multiple Vitamins-Minerals (MULTIVITAMIN PO) Take 1 tablet by mouth daily.   Yes Historical Provider, MD  omeprazole (PRILOSEC) 40 MG capsule 1 tab po bid Patient taking differently: Take 40 mg by mouth 2 (two) times daily.  07/30/14  Yes Tammi Sou, MD  oxyCODONE-acetaminophen (PERCOCET/ROXICET) 5-325 MG per tablet Take 1 tablet by mouth every 6 (six) hours as needed for severe pain.   Yes Historical Provider, MD  pregabalin (LYRICA) 150  MG capsule Take 1 capsule (150 mg total) by mouth 2 (two) times daily. 12/01/13  Yes Tammi Sou, MD  rOPINIRole (REQUIP) 2 MG tablet 1 tab po bid Patient taking differently: Take 2 mg by mouth 2 (two) times daily.  07/08/14  Yes Tammi Sou, MD  SPIRIVA HANDIHALER 18 MCG inhalation capsule INHALE CONTENTS OF 1 CAPSULE USING HANDIHALER EVERY DAY 06/24/14  Yes Tammi Sou, MD  buPROPion (WELLBUTRIN XL) 150 MG 24 hr tablet Take 1 tablet (150 mg total) by mouth daily. Patient not taking: Reported on 09/29/2014 08/20/14   Tammi Sou, MD  polyethylene glycol (MIRALAX / GLYCOLAX) packet Take 17 g by mouth daily. Patient not taking: Reported on 09/29/2014 04/29/14   Ripudeep Krystal Eaton, MD   Physical Exam: Filed Vitals:   09/29/14 1615 09/29/14 1804 09/29/14 1850 09/29/14 2046  BP: 119/43 136/46 113/46 152/47  Pulse: 71 76 71 79  Temp:   98.6 F (37 C) 98.1 F (36.7 C)  TempSrc:   Oral Oral  Resp: 15 20 20 18   SpO2: 100% 100% 100% 100%    Physical Exam  Constitutional: Appears well-developed and well-nourished. No distress.  HENT: Normocephalic. No tonsillar erythema or exudates Eyes: Conjunctivae are normal. No scleral icterus.  Neck: Normal ROM. Neck supple. No JVD. No tracheal deviation. No thyromegaly.  CVS: RRR, S1/S2 +, no murmurs, no gallops, no carotid bruit.  Pulmonary: Effort and breath sounds normal, no stridor, rhonchi, wheezes, rales.  Abdominal: Soft. Obses, BS +,  no distension, tenderness, rebound or guarding.  Musculoskeletal: Normal range of motion. LE +2 edema but no tenderness.  Lymphadenopathy: No lymphadenopathy noted, cervical, inguinal. Neuro: Alert. Normal reflexes, muscle tone coordination. No focal neurologic deficits. Skin: Skin is warm and dry. No rash noted.  No erythema. No pallor.  Psychiatric: Normal mood and affect. Behavior, judgment, thought content normal.   Labs on Admission:  Basic Metabolic Panel:  Recent Labs Lab 09/29/14 1155  NA 135   K 4.9  CL 94*  CO2 32  GLUCOSE 106*  BUN 62*  CREATININE 1.58*  CALCIUM 9.2   Liver Function Tests: No results for input(s): AST, ALT, ALKPHOS, BILITOT, PROT, ALBUMIN in the last 168 hours. No results for input(s): LIPASE, AMYLASE in the last 168 hours. No results for input(s): AMMONIA in the last 168 hours. CBC:  Recent Labs Lab 09/29/14 1155  WBC 13.3*  HGB 9.4*  HCT 29.4*  MCV 78.0  PLT 93*   Cardiac Enzymes:  Recent Labs Lab 09/29/14 1155  TROPONINI <0.03   BNP: Invalid input(s): POCBNP CBG:  Recent Labs Lab 09/29/14 1706  GLUCAP 73    If 7PM-7AM, please contact night-coverage [email or web address] Password [email or web address] 09/29/2014, 9:00 PM        [facility] History and Physical                                                                                    Nigel Wessman, is a 69 y.o. female  MRN: [id number]   DOB - Aug 22, 1945  Admit Date - 09/29/2014  Outpatient Primary MD for the patient is Tammi Sou, MD  Referring MD: Regenia Skeeter / ER  With History of -  Past Medical History  Diagnosis Date  . COPD (chronic obstructive pulmonary disease)     nocturnal oxygen, plus daytime use with activity  . Fibromyalgia     Dx'd 1993.  This has impaired her significantly.  . IBS (irritable bowel syndrome)   . Edema   . Gallstones     asymptomatic per pt (as of 09/30/2013); mild chronic elevation of Alk phos (remainder of hepatic panel wnl).  . Hernia of abdominal wall   . Hypertension   . Hyperlipidemia   . On home oxygen therapy     "3.5L @ night and during day prn" (09/28/2013)  . History of pneumonia     "once"  . Adrenal benign tumor   . Type II diabetes mellitus     hx of good control per pt report + HbA1c <7% June 2015.  Marland Kitchen Anemia 2015    Baseline Hb 11, normocytic.  Marland Kitchen GERD (gastroesophageal reflux disease)   . History of duodenal ulcer   . Daily headache   . Migraine     "@ least monthly" (09/28/2013)  . TIA (transient ischemic  attack) 2012; 2014    left facial and left arm numbness  . Degenerative disc disease     Cervical and lumbar  . Osteoarthritis (arthritis due to wear and tear of joints)     "back; knees; elbows; left ankle" (09/28/2013)  . Chronic lower back pain     "L5-S1" (09/28/2013)  . Gout   . Anxiety   . Depression   . Breast cancer 2008    "right".  Lumpectomy + radiation+aromatase inhibitor (femara).  Regular f/u and mammos normal.  . Tremor     "upper extremities; last 6-7 months" (09/28/2013)  . Unspecified hypothyroidism 12/21/2013  . Multinodular goiter summer 2015    FNA 26/3/33 "Follicular lesion of undetermined significance": a follicular lesion/neoplasm can not be ruled out--Dr. Cruzita Lederer recommended repeat bx in 6-12 mo  . Closed right trimalleolar fracture 01/2014    Surgery/fixation hardware placed.  . Chronic diastolic  CHF (congestive heart failure) 2015      Past Surgical History  Procedure Laterality Date  . Lumbar laminectomy  1991; 1996  . Ankle fracture surgery Left 08/2011    Bimalleolar fx  . Total abdominal hysterectomy w/ bilateral salpingoophorectomy  1990  . Breast biopsy Right 2008  . Breast lumpectomy Right 2008  . Cataract extraction w/ intraocular lens implant Right 08/2013  . Cardiac catheterization  2000's X 2  . Bladder tack    . Orif ankle fracture Right 02/11/2014    Procedure: OPEN REDUCTION INTERNAL FIXATION (ORIF) ANKLE FRACTURE;  Surgeon: Alta Corning, MD;  Location: [place];  Service: Orthopedics;  Laterality: Right;  . Transthoracic echocardiogram  03/2014    Mild LVH, EF 60-65%, grade I diast dysfxn.  Mild MR.  . Holter monitor  08/2014    NORMAL    in for   Chief Complaint  Patient presents with  . Weakness  . Tremors     HPI This is a 47 yp female with known chronic hypoxic resp failure 2/2 COPD (presumed severe per PCP), chronic diastolic heart failure grade 1, history of nonhealing right ankle fracture with limited weightbearing, chronic lower  extremity edema, fibromyalgia and chronic pain syndrome. Last discharged January 2016 after treatment for influenza pneumonitis and acute diastolic heart failure. During that hospitalization Lasix dosage was adjusted upwards (diuresed >13 L that admission). Since that time patient has done relatively well and had increased baseline oxygen from 3 to 4 L. She has noticed progressive weakness for one month. Currently on antibiotics for right upper lobe pneumonia. Reports dizziness and anorexia and weakness especially with ambulation progressive over one month. No dyspnea on exertion worse than baseline. Able to still work with PT 3 days a week although endorses significant weakness. Scheduled for second opinion at [facility] with orthopedist. Has not had any diarrhea. Previous rectal bleeding noted during last admission has resolved.  In ER patient had signs consistent with acute renal failure 62/1.58 with baseline of 36/0.93. Patient noted to be on Lasix 60 mg twice a day an ACE inhibitor at home. Patient was afebrile and was hemodynamically stable with blood pressures ranging between 134/48 and 116/45. She was stable on 3 L oxygen with 96% saturation. Chest x-ray revealed increased right basilar subsegmental atelectasis consistent with recent diagnosis of right-sided pneumonia. CT of the head showed no acute abnormalities. He is an outpatient CT angiogram of the chest to rule out PE on 09/23/14 demonstrated ill-defined small densities in the right upper lobe that were concerning for possible pneumonia had some reason for institution of an biotics. There was also 2.5 cm solid density in the posterior right thyroid lobe concerning for nodule (according to problem list patient has a history of multinodular goiter and underwent ultrasound-guided thyroid biopsy on 01/27/14).   Review of Systems   In addition to the HPI above,  No Fever-chills, myalgias or other constitutional symptoms No Headache, changes with  Vision or hearing, new local weakness, tingling, numbness in any extremity, No problems swallowing food or Liquids, indigestion/reflux No Chest pain, Cough or change in chronic Shortness of Breath; no palpitations, orthopnea or change in chronic DOE No Abdominal pain, N/V; no melena or hematochezia, no dark tarry stools, Bowel movements are regular, No dysuria, hematuria or flank pain No new skin rashes, lesions, masses or bruises, No new joints pains-aches No recent weight gain or loss No polyuria, polydypsia or polyphagia,  *A full 10 point Review of Systems was  done, except as stated above, all other Review of Systems were negative.  Social History History  Substance Use Topics  . Smoking status: Former Smoker -- 1.00 packs/day for 36 years    Types: Cigarettes    Quit date: 09/09/2013  . Smokeless tobacco: Never Used  . Alcohol Use: No    Resides at: Private residence  Lives with: Husband  Ambulatory status: With walker noting has splint to right foot and ankle; works with outpatient physical therapy 3 days per week   Family History Family History  Problem Relation Age of Onset  . Arthritis Mother   . Breast cancer Mother   . Hyperlipidemia Mother   . Stroke Mother   . Hypertension Mother   . Mental illness Mother   . Arthritis Father   . Cancer Father   . Hyperlipidemia Father   . Heart disease Father   . Heart disease Maternal Grandmother   . Hyperlipidemia Maternal Grandmother   . Heart disease Paternal Grandmother   . Hypertension Paternal Grandmother   . Diabetes Paternal Grandmother   . Arthritis Paternal Grandfather      Prior to Admission medications   Medication Sig Start Date End Date Taking? Authorizing Provider  albuterol (PROVENTIL HFA;VENTOLIN HFA) 108 (90 BASE) MCG/ACT inhaler Inhale 2 puffs into the lungs every 6 (six) hours as needed for wheezing or shortness of breath. 10/10/13  Yes Domenic Polite, MD  albuterol (PROVENTIL) (2.5 MG/3ML)  0.083% nebulizer solution Take 3 mLs (2.5 mg total) by nebulization every 4 (four) hours as needed for wheezing or shortness of breath. 01/18/14  Yes Tammi Sou, MD  ALPRAZolam Duanne Moron) 0.5 MG tablet Take 1 tablet (0.5 mg total) by mouth 2 (two) times daily as needed for anxiety or sleep. Patient taking differently: Take 0.5 mg by mouth at bedtime as needed for sleep.  07/06/14  Yes Tammi Sou, MD  atorvastatin (LIPITOR) 40 MG tablet TAKE 1 TABLET BY MOUTH DAILY 07/20/14  Yes Tammi Sou, MD  cetirizine (ZYRTEC) 10 MG tablet Take 10 mg by mouth daily as needed for allergies.   Yes Historical Provider, MD  cholecalciferol (VITAMIN D) 1000 UNITS tablet Take 1,000 Units by mouth daily.   Yes Historical Provider, MD  citalopram (CELEXA) 20 MG tablet TAKE 1 TABLET BY MOUTH EVERY DAY 08/25/14  Yes Tammi Sou, MD  clarithromycin (BIAXIN) 500 MG tablet 1 tab po bid x 10 days 09/23/14  Yes Tammi Sou, MD  clopidogrel (PLAVIX) 75 MG tablet Take 1 tablet (75 mg total) by mouth daily. 06/17/14  Yes Tammi Sou, MD  furosemide (LASIX) 40 MG tablet Take 1.5 tablets (60 mg total) by mouth 2 (two) times daily. 08/02/14  Yes Doy Hutching, MD  insulin aspart (NOVOLOG) 100 UNIT/ML injection 3 U SQ qAC Patient taking differently: Inject 3 Units into the skin 3 (three) times daily with meals.  06/17/14  Yes Tammi Sou, MD  Insulin Glargine (LANTUS) 100 UNIT/ML Solostar Pen Inject 40 Units into the skin 2 (two) times daily. 08/30/14  Yes Tammi Sou, MD  letrozole [facility]) 2.5 MG tablet TAKE 1 TABLET BY MOUTH EVERY DAY 09/27/14  Yes Tammi Sou, MD  levothyroxine (SYNTHROID, LEVOTHROID) 100 MCG tablet Take 1 tablet (100 mcg total) by mouth daily before breakfast. 02/09/14  Yes Tammi Sou, MD  lisinopril (PRINIVIL,ZESTRIL) 10 MG tablet Take 1 tablet (10 mg total) by mouth daily. 11/12/13  Yes Tammi Sou, MD  Magnesium Oxide  500 MG (LAX) TABS Take 1,000 mg by mouth daily.     Yes Historical Provider, MD  meclizine (ANTIVERT) 12.5 MG tablet TAKE 1 TO 2 TABLETS BY MOUTH EVERY 6 HOURS AS NEEDED FOR NAUSEA OR DIZZINESS 09/08/14  Yes Tammi Sou, MD  morphine (MS CONTIN) 15 MG 12 hr tablet 1 tab po qAM and 2 tabs po qhs 08/06/14  Yes Tammi Sou, MD  Multiple Vitamins-Minerals (MULTIVITAMIN PO) Take 1 tablet by mouth daily.   Yes Historical Provider, MD  omeprazole (PRILOSEC) 40 MG capsule 1 tab po bid Patient taking differently: Take 40 mg by mouth 2 (two) times daily.  07/30/14  Yes Tammi Sou, MD  oxyCODONE-acetaminophen (PERCOCET/ROXICET) 5-325 MG per tablet Take 1 tablet by mouth every 6 (six) hours as needed for severe pain.   Yes Historical Provider, MD  pregabalin (LYRICA) 150 MG capsule Take 1 capsule (150 mg total) by mouth 2 (two) times daily. 12/01/13  Yes Tammi Sou, MD  rOPINIRole (REQUIP) 2 MG tablet 1 tab po bid Patient taking differently: Take 2 mg by mouth 2 (two) times daily.  07/08/14  Yes Tammi Sou, MD  SPIRIVA HANDIHALER 18 MCG inhalation capsule INHALE CONTENTS OF 1 CAPSULE USING HANDIHALER EVERY DAY 06/24/14  Yes Tammi Sou, MD  buPROPion (WELLBUTRIN XL) 150 MG 24 hr tablet Take 1 tablet (150 mg total) by mouth daily. Patient not taking: Reported on 09/29/2014 08/20/14   Tammi Sou, MD  polyethylene glycol (MIRALAX / GLYCOLAX) packet Take 17 g by mouth daily. Patient not taking: Reported on 09/29/2014 04/29/14   Ripudeep Krystal Eaton, MD    Allergies  Allergen Reactions  . Ciprofloxacin Other (See Comments)    Unspecified--noted in old PCP's records.  . Clindamycin/Lincomycin Rash  . Fentanyl Rash    Duragesic Patch  . Indocin [Indomethacin] Rash  . Keflex [Cephalexin] Rash  . Latex Other (See Comments)    Unspecified: noted in old PCP's records.  . Penicillins Rash  . Vancomycin Rash    Physical Exam  Vitals  Blood pressure 113/59, pulse 77, temperature 98.9 F (37.2 C), temperature source Oral, resp. rate 14,  SpO2 100 %.   General:  In no acute distress, appears healthy and well nourished  Psych:  Normal affect, Denies Suicidal or Homicidal ideations, Awake Alert, Oriented X 3. Speech and thought patterns are clear and appropriate, no apparent short term memory deficits  Neuro:   No focal neurological deficits, CN II through XII intact, Strength 5/5 all 4 extremities, Sensation intact all 4 extremities.  ENT:  Ears and Eyes appear Normal, Conjunctivae clear, PER. Moist oral mucosa without erythema or exudates.  Neck:  Supple, No lymphadenopathy appreciated  Respiratory:  Symmetrical chest wall movement, Good air movement bilaterally, CTAB. Room Air  Cardiac:  RRR, No Murmurs, no LE edema noted, no JVD, No carotid bruits, peripheral pulses palpable at 2+  Abdomen:  Positive bowel sounds, Soft, Non tender, Non distended,  No masses appreciated, no obvious hepatosplenomegaly  Skin:  No Cyanosis, Normal Skin Turgor, No Skin Rash or Bruise.  Extremities: Symmetrical without obvious trauma or injury,  no effusions. Splint to right foot/ankle  Data Review  CBC  Recent Labs Lab 09/29/14 1155  WBC 13.3*  HGB 9.4*  HCT 29.4*  PLT 93*  MCV 78.0  MCH 24.9*  MCHC 32.0  RDW 14.5    Chemistries   Recent Labs Lab 09/29/14 1155  NA 135  K 4.9  CL  94*  CO2 32  GLUCOSE 106*  BUN 62*  CREATININE 1.58*  CALCIUM 9.2    CrCl cannot be calculated (Unknown ideal weight.).  No results for input(s): TSH, T4TOTAL, T3FREE, THYROIDAB in the last 72 hours.  Invalid input(s): FREET3  Coagulation profile No results for input(s): INR, PROTIME in the last 168 hours.  No results for input(s): DDIMER in the last 72 hours.  Cardiac Enzymes  Recent Labs Lab 09/29/14 1155  TROPONINI <0.03    Invalid input(s): POCBNP  Urinalysis    Component Value Date/Time   COLORURINE YELLOW 09/29/2014 1043   APPEARANCEUR CLEAR 09/29/2014 1043   LABSPEC 1.008 09/29/2014 1043   PHURINE 5.5  09/29/2014 1043   GLUCOSEU NEGATIVE 09/29/2014 1043   HGBUR NEGATIVE 09/29/2014 1043   BILIRUBINUR NEGATIVE 09/29/2014 1043   KETONESUR NEGATIVE 09/29/2014 1043   PROTEINUR NEGATIVE 09/29/2014 1043   UROBILINOGEN 0.2 09/29/2014 1043   NITRITE NEGATIVE 09/29/2014 1043   LEUKOCYTESUR TRACE* 09/29/2014 1043    Imaging results:   Ct Head Wo Contrast  09/29/2014   CLINICAL DATA:  Weakness and tremors.  EXAM: CT HEAD WITHOUT CONTRAST  TECHNIQUE: Contiguous axial images were obtained from the base of the skull through the vertex without intravenous contrast.  COMPARISON:  10/07/2013  FINDINGS: Stable mild atrophy and small vessel disease. Stable probable old lacunar infarct in the inferior aspect of the left basal ganglia. The brain demonstrates no evidence of hemorrhage, acute infarction, edema, mass effect, extra-axial fluid collection, hydrocephalus or mass lesion. The skull is unremarkable. Stable mucous retention cyst in the right maxillary antrum.  IMPRESSION: No acute abnormalities identified. Stable mild atrophy, small vessel disease and right basal ganglia lacunar infarct.   Electronically Signed   By: Aletta Edouard M.D.   On: 09/29/2014 12:29   Ct Angio Chest Pe W/cm &/or Wo Cm  09/23/2014   CLINICAL DATA:  Shortness of breath.  EXAM: CT ANGIOGRAPHY CHEST WITH CONTRAST  TECHNIQUE: Multidetector CT imaging of the chest was performed using the standard protocol during bolus administration of intravenous contrast. Multiplanar CT image reconstructions and MIPs were obtained to evaluate the vascular anatomy.  CONTRAST:  133m OMNIPAQUE IOHEXOL 350 MG/ML SOLN  COMPARISON:  CT scan of April 17, 2014.  FINDINGS: No pneumothorax or pleural effusion is noted. Mild airspace opacity with air bronchograms is noted inferiorly in right middle lobe most consistent with subsegmental atelectasis. Ill-defined densities are noted in right upper lobe concerning for possible early pneumonia or inflammation. No  pulmonary embolus is noted. Elevated right hemidiaphragm is again noted and unchanged. Residual mediastinal adenopathy is noted which is decreased in size compared to prior exam, most consistent with reactive or improving inflammatory adenopathy. There is no evidence thoracic aortic dissection or aneurysm. Atherosclerosis of thoracic aorta is noted.  Stable 2.5 x 2.4 cm solid nodule seen posterior to right thyroid lobe. Stable right adrenal adenoma is noted compared to prior exam. No significant osseous abnormality is noted.  Review of the MIP images confirms the above findings.  IMPRESSION: No evidence of pulmonary embolus.  Elevated right hemidiaphragm is noted with probable subsegmental atelectasis seen in the right middle lobe.  Ill-defined small densities are noted in right upper lobe concerning for possible pneumonia or inflammation.  Stable right adrenal adenoma.  2.5 cm solid density is seen posterior to right thyroid lobe concerning for thyroid nodule. Thyroid ultrasound is recommended for further evaluation.   Electronically Signed   By: JMarijo Conception M.D.   On:  09/23/2014 16:02   Dg Chest Port 1 View  09/29/2014   CLINICAL DATA:  Cough.  Weakness.  Tremors.  COPD.  EXAM: PORTABLE CHEST - 1 VIEW  COMPARISON:  08/02/2014 and 04/20/2014  FINDINGS: Mild cardiomegaly remains stable. No evidence of congestive heart failure. Low lung volumes again noted as well as chronic elevation of right hemidiaphragm. Increased linear opacity is noted in the lateral right lung base, consistent with subsegmental atelectasis. No evidence of pulmonary consolidation.  IMPRESSION: Increased right basilar subsegmental atelectasis.  Stable cardiomegaly and chronic elevation of right hemidiaphragm.   Electronically Signed   By: [name] M.D.   On: 09/29/2014 11:35     EKG: (Independently reviewed) sinus rhythm without any ST or T-wave changes that would be concerning for ischemia   Assessment & Plan  Principal  Problem:   Acute renal failure on Chronic renal insufficiency, stage III (moderate) -Admit to medical floor -Suspect etiology overdiuresis in conjunction with use of ACE inhibitor and therefore may also have mild ATN -Hold Lasix and ACE inhibitor -IV fluid hydration -Follow labs  Active Problems:   Weakness -Appears to correlate with progressive renal dysfunction and volume depletion -Treat underlying also is as noted above -Continue physical therapy during acute illness to minimize decompensation    Chronic respiratory failure with hypoxia/COPD/CAP  -Currently stable and at baseline level of oxygen therapy; no wheezing -We'll continue preadmission clarithromycin started several days ago for pneumonia process seen on outpatient CT of the chest -per PCP notes patient needs eventual follow-up with pulmonologist or a minimum outpatient PFTs to confirm severity of COPD diagnosis    HTN (hypertension), benign -Current blood pressure soft and given dehydration holding home antihypertensives medications      Chronic diastolic heart failure, NYHA class 1 -Appears compensated at this juncture -Hold Lasix -May need to rethink outpatient administration of Lasix i.e. continue with low-dose daily Lasix and adjust/administer extra doses based on weight gain as opposed to twice a day dosing    Diabetes type 2, controlled -Continue preadmission Lantus -Check CBGs before meals/hour of sleep and provide SSI -Check hemoglobin A1c    Fibromyalgia/Chronic pain syndrome -Continue preadmission medications    Hypothyroidism -Continue Synthroid -Since experiencing weakness consider checking TSH noting and last checked April 2016 was low at 0.327    Closed right trimalleolar fracture -Has been utilizing limited weightbearing with walker and PT at home -Patient has outpatient appointment scheduled with [facility]/orthopod this week    Thrombocytopenia (chronic) -Platelets less than 100,000 with noted  baseline 120,000 therefore will defer pharmacological DVT prophylaxis in favor of SCDs    DVT Prophylaxis: SCDs  Family Communication:   Husband at bedside  Code Status:  Full code  Condition: Stable   Discharge disposition: Anticipate will return to home environment with husband after renal failure resolved  Time spent in minutes : 60      ELLIS,ALLISON L. ANP on 09/29/2014 at 2:50 PM  Between 7am to 7pm - Pager - [phone]  After 7pm go to [email or web address] - password [email or web address]  And look for the night coverage person covering me after hours  [facility]

## 2014-09-29 NOTE — ED Provider Notes (Signed)
CSN: [id number]     Arrival date & time 09/29/14  1017 History   First MD Initiated Contact with Patient 09/29/14 1041     Chief Complaint  Patient presents with  . Weakness  . Tremors     (Consider location/radiation/quality/duration/timing/severity/associated sxs/prior Treatment) HPI   69 year old female presents with increasing weakness and intermittent tremors but his bilateral for the past 1 month. Today her husband was unable to get her up out of her bed because she was so weak and so she came to the hospital. The patient's been having a lot of difficulties ablating because of a ankle fracture she suffered in October 2015. The patient has bilateral lower extremity swelling but this is not new. She has a cough but it is improving after she was diagnosed with pneumonia and put on clarithromycin. She denies chest pain or shortness of breath now. Denies any headaches. The weakness is diffuse.  Past Medical History  Diagnosis Date  . COPD (chronic obstructive pulmonary disease)     nocturnal oxygen, plus daytime use with activity  . Fibromyalgia     Dx'd 1993.  This has impaired her significantly.  . IBS (irritable bowel syndrome)   . Edema   . Gallstones     asymptomatic per pt (as of 09/30/2013); mild chronic elevation of Alk phos (remainder of hepatic panel wnl).  . Hernia of abdominal wall   . Hypertension   . Hyperlipidemia   . On home oxygen therapy     "3.5L @ night and during day prn" (09/28/2013)  . History of pneumonia     "once"  . Adrenal benign tumor   . Type II diabetes mellitus     hx of good control per pt report + HbA1c <7% June 2015.  Marland Kitchen Anemia 2015    Baseline Hb 11, normocytic.  Marland Kitchen GERD (gastroesophageal reflux disease)   . History of duodenal ulcer   . Daily headache   . Migraine     "@ least monthly" (09/28/2013)  . TIA (transient ischemic attack) 2012; 2014    left facial and left arm numbness  . Degenerative disc disease     Cervical and lumbar  .  Osteoarthritis (arthritis due to wear and tear of joints)     "back; knees; elbows; left ankle" (09/28/2013)  . Chronic lower back pain     "L5-S1" (09/28/2013)  . Gout   . Anxiety   . Depression   . Breast cancer 2008    "right".  Lumpectomy + radiation+aromatase inhibitor (femara).  Regular f/u and mammos normal.  . Tremor     "upper extremities; last 6-7 months" (09/28/2013)  . Unspecified hypothyroidism 12/21/2013  . Multinodular goiter summer 2015    FNA 06/26/98 "Follicular lesion of undetermined significance": a follicular lesion/neoplasm can not be ruled out--Dr. Cruzita Lederer recommended repeat bx in 6-12 mo  . Closed right trimalleolar fracture 01/2014    Surgery/fixation hardware placed.  . Chronic diastolic CHF (congestive heart failure) 2015   Past Surgical History  Procedure Laterality Date  . Lumbar laminectomy  1991; 1996  . Ankle fracture surgery Left 08/2011    Bimalleolar fx  . Total abdominal hysterectomy w/ bilateral salpingoophorectomy  1990  . Breast biopsy Right 2008  . Breast lumpectomy Right 2008  . Cataract extraction w/ intraocular lens implant Right 08/2013  . Cardiac catheterization  2000's X 2  . Bladder tack    . Orif ankle fracture Right 02/11/2014    Procedure: OPEN REDUCTION INTERNAL  FIXATION (ORIF) ANKLE FRACTURE;  Surgeon: Alta Corning, MD;  Location: [facility];  Service: Orthopedics;  Laterality: Right;  . Transthoracic echocardiogram  03/2014    Mild LVH, EF 60-65%, grade I diast dysfxn.  Mild MR.  . Holter monitor  08/2014    NORMAL   Family History  Problem Relation Age of Onset  . Arthritis Mother   . Breast cancer Mother   . Hyperlipidemia Mother   . Stroke Mother   . Hypertension Mother   . Mental illness Mother   . Arthritis Father   . Cancer Father   . Hyperlipidemia Father   . Heart disease Father   . Heart disease Maternal Grandmother   . Hyperlipidemia Maternal Grandmother   . Heart disease Paternal Grandmother   . Hypertension Paternal  Grandmother   . Diabetes Paternal Grandmother   . Arthritis Paternal Grandfather    History  Substance Use Topics  . Smoking status: Former Smoker -- 1.00 packs/day for 36 years    Types: Cigarettes    Quit date: 09/09/2013  . Smokeless tobacco: Never Used  . Alcohol Use: No   OB History    No data available     Review of Systems  Constitutional: Negative for fever.  Respiratory: Positive for cough. Negative for shortness of breath.   Cardiovascular: Negative for chest pain.  Gastrointestinal: Negative for vomiting and abdominal pain.  Neurological: Positive for tremors and weakness. Negative for headaches.  All other systems reviewed and are negative.     Allergies  Ciprofloxacin; Clindamycin/lincomycin; Fentanyl; Indocin; Keflex; Latex; Penicillins; and Vancomycin  Home Medications   Prior to Admission medications   Medication Sig Start Date End Date Taking? Authorizing Provider  albuterol (PROVENTIL HFA;VENTOLIN HFA) 108 (90 BASE) MCG/ACT inhaler Inhale 2 puffs into the lungs every 6 (six) hours as needed for wheezing or shortness of breath. 10/10/13  Yes Domenic Polite, MD  albuterol (PROVENTIL) (2.5 MG/3ML) 0.083% nebulizer solution Take 3 mLs (2.5 mg total) by nebulization every 4 (four) hours as needed for wheezing or shortness of breath. 01/18/14  Yes Tammi Sou, MD  ALPRAZolam Duanne Moron) 0.5 MG tablet Take 1 tablet (0.5 mg total) by mouth 2 (two) times daily as needed for anxiety or sleep. Patient taking differently: Take 0.5 mg by mouth at bedtime as needed for sleep.  07/06/14  Yes Tammi Sou, MD  atorvastatin (LIPITOR) 40 MG tablet TAKE 1 TABLET BY MOUTH DAILY 07/20/14  Yes Tammi Sou, MD  cetirizine (ZYRTEC) 10 MG tablet Take 10 mg by mouth daily as needed for allergies.   Yes Historical Provider, MD  cholecalciferol (VITAMIN D) 1000 UNITS tablet Take 1,000 Units by mouth daily.   Yes Historical Provider, MD  citalopram (CELEXA) 20 MG tablet TAKE 1  TABLET BY MOUTH EVERY DAY 08/25/14  Yes Tammi Sou, MD  clarithromycin (BIAXIN) 500 MG tablet 1 tab po bid x 10 days 09/23/14  Yes Tammi Sou, MD  clopidogrel (PLAVIX) 75 MG tablet Take 1 tablet (75 mg total) by mouth daily. 06/17/14  Yes Tammi Sou, MD  furosemide (LASIX) 40 MG tablet Take 1.5 tablets (60 mg total) by mouth 2 (two) times daily. 08/02/14  Yes Doy Hutching, MD  insulin aspart (NOVOLOG) 100 UNIT/ML injection 3 U SQ qAC Patient taking differently: Inject 3 Units into the skin 3 (three) times daily with meals.  06/17/14  Yes Tammi Sou, MD  Insulin Glargine (LANTUS) 100 UNIT/ML Solostar Pen Inject 40 Units  into the skin 2 (two) times daily. 08/30/14  Yes Tammi Sou, MD  letrozole [facility]) 2.5 MG tablet TAKE 1 TABLET BY MOUTH EVERY DAY 09/27/14  Yes Tammi Sou, MD  levothyroxine (SYNTHROID, LEVOTHROID) 100 MCG tablet Take 1 tablet (100 mcg total) by mouth daily before breakfast. 02/09/14  Yes Tammi Sou, MD  lisinopril (PRINIVIL,ZESTRIL) 10 MG tablet Take 1 tablet (10 mg total) by mouth daily. 11/12/13  Yes Tammi Sou, MD  Magnesium Oxide 500 MG (LAX) TABS Take 1,000 mg by mouth daily.    Yes Historical Provider, MD  meclizine (ANTIVERT) 12.5 MG tablet TAKE 1 TO 2 TABLETS BY MOUTH EVERY 6 HOURS AS NEEDED FOR NAUSEA OR DIZZINESS 09/08/14  Yes Tammi Sou, MD  morphine (MS CONTIN) 15 MG 12 hr tablet 1 tab po qAM and 2 tabs po qhs 08/06/14  Yes Tammi Sou, MD  Multiple Vitamins-Minerals (MULTIVITAMIN PO) Take 1 tablet by mouth daily.   Yes Historical Provider, MD  omeprazole (PRILOSEC) 40 MG capsule 1 tab po bid Patient taking differently: Take 40 mg by mouth 2 (two) times daily.  07/30/14  Yes Tammi Sou, MD  oxyCODONE-acetaminophen (PERCOCET/ROXICET) 5-325 MG per tablet Take 1 tablet by mouth every 6 (six) hours as needed for severe pain.   Yes Historical Provider, MD  pregabalin (LYRICA) 150 MG capsule Take 1 capsule (150 mg total) by  mouth 2 (two) times daily. 12/01/13  Yes Tammi Sou, MD  rOPINIRole (REQUIP) 2 MG tablet 1 tab po bid Patient taking differently: Take 2 mg by mouth 2 (two) times daily.  07/08/14  Yes Tammi Sou, MD  SPIRIVA HANDIHALER 18 MCG inhalation capsule INHALE CONTENTS OF 1 CAPSULE USING HANDIHALER EVERY DAY 06/24/14  Yes Tammi Sou, MD  buPROPion (WELLBUTRIN XL) 150 MG 24 hr tablet Take 1 tablet (150 mg total) by mouth daily. Patient not taking: Reported on 09/29/2014 08/20/14   Tammi Sou, MD  polyethylene glycol (MIRALAX / GLYCOLAX) packet Take 17 g by mouth daily. Patient not taking: Reported on 09/29/2014 04/29/14   Ripudeep K Rai, MD   BP 134/48 mmHg  Pulse 86  Temp(Src) 98.9 F (37.2 C) (Oral)  Resp 17  SpO2 96% Physical Exam  Constitutional: She is oriented to person, place, and time. She appears well-developed and well-nourished.  HENT:  Head: Normocephalic and atraumatic.  Right Ear: External ear normal.  Left Ear: External ear normal.  Nose: Nose normal.  Eyes: Right eye exhibits no discharge. Left eye exhibits no discharge.  Cardiovascular: Normal rate, regular rhythm and normal heart sounds.   Pulmonary/Chest: Effort normal and breath sounds normal. She has no wheezes. She has no rales.  Abdominal: Soft. She exhibits no distension. There is no tenderness.  Musculoskeletal: She exhibits edema ( bilateral lower extremity pitting edema).   Right ankle in a Velcro splint  Neurological: She is alert and oriented to person, place, and time.  Equal strength in all 4 extremities  Skin: Skin is warm and dry.  Nursing note and vitals reviewed.   ED Course  Procedures (including critical care time) Labs Review Labs Reviewed  CBC - Abnormal; Notable for the following:    WBC 13.3 (*)    RBC 3.77 (*)    Hemoglobin 9.4 (*)    HCT 29.4 (*)    MCH 24.9 (*)    Platelets 93 (*)    All other components within normal limits  BASIC METABOLIC PANEL - Abnormal;  Notable for  the following:    Chloride 94 (*)    Glucose, Bld 106 (*)    BUN 62 (*)    Creatinine, Ser 1.58 (*)    GFR calc non Af Amer 32 (*)    GFR calc Af Amer 37 (*)    All other components within normal limits  URINALYSIS, ROUTINE W REFLEX MICROSCOPIC (NOT AT [facility]) - Abnormal; Notable for the following:    Leukocytes, UA TRACE (*)    All other components within normal limits  TROPONIN I  BRAIN NATRIURETIC PEPTIDE  URINE MICROSCOPIC-ADD ON  CBG MONITORING, ED    Imaging Review Ct Head Wo Contrast  09/29/2014   CLINICAL DATA:  Weakness and tremors.  EXAM: CT HEAD WITHOUT CONTRAST  TECHNIQUE: Contiguous axial images were obtained from the base of the skull through the vertex without intravenous contrast.  COMPARISON:  10/07/2013  FINDINGS: Stable mild atrophy and small vessel disease. Stable probable old lacunar infarct in the inferior aspect of the left basal ganglia. The brain demonstrates no evidence of hemorrhage, acute infarction, edema, mass effect, extra-axial fluid collection, hydrocephalus or mass lesion. The skull is unremarkable. Stable mucous retention cyst in the right maxillary antrum.  IMPRESSION: No acute abnormalities identified. Stable mild atrophy, small vessel disease and right basal ganglia lacunar infarct.   Electronically Signed   By: Aletta Edouard M.D.   On: 09/29/2014 12:29   Dg Chest Port 1 View  09/29/2014   CLINICAL DATA:  Cough.  Weakness.  Tremors.  COPD.  EXAM: PORTABLE CHEST - 1 VIEW  COMPARISON:  08/02/2014 and 04/20/2014  FINDINGS: Mild cardiomegaly remains stable. No evidence of congestive heart failure. Low lung volumes again noted as well as chronic elevation of right hemidiaphragm. Increased linear opacity is noted in the lateral right lung base, consistent with subsegmental atelectasis. No evidence of pulmonary consolidation.  IMPRESSION: Increased right basilar subsegmental atelectasis.  Stable cardiomegaly and chronic elevation of right hemidiaphragm.    Electronically Signed   By: [name] M.D.   On: 09/29/2014 11:35     EKG Interpretation   Date/Time:  Wednesday September 29 2014 10:21:01 EDT Ventricular Rate:  86 PR Interval:  151 QRS Duration: 91 QT Interval:  352 QTC Calculation: 421 R Axis:   9 Text Interpretation:  Sinus rhythm Low voltage, precordial leads  Anteroseptal infarct, old no significant change since April 2016 Confirmed  by Regenia Skeeter  MD, Grant [phone]) on 09/29/2014 12:08:44 PM      MDM   Final diagnoses:  Acute kidney injury    Patient is hemodynamically stable here but does have evidence of acute kidney injury. On further review the patient was in this ER 2 months ago with increased swelling and was increased from 40 mg BID to 60 mg BID  And she has continued this for the past 2 months. Likely this is contracting to the AKI. Otherwise workup is unremarkable, will  Admit to the hospitalist for  Fluids and monitoring.    Sherwood Gambler, MD 09/29/14 [phone]

## 2014-09-29 NOTE — ED Notes (Signed)
Pt transported to CT ?

## 2014-09-29 NOTE — ED Notes (Signed)
Glucose resulted in BMP: 106

## 2014-09-29 NOTE — ED Notes (Addendum)
Pt presents from [facility] with c/o generalized weakness and tremors in her upper and lower extremities x 40month.  Pt is A&Ox4 and in NAD.  Pt uses 4L [place] at home and has hx COPD.  Pt also has a right ankle fracture that has not healed x8 months.  Pt diagnosed with Pneumonia via Chest CT Angio at [facility] last week, and is due to have a CT of her right ankle   Pt states was not admitted and was not given antibiotics.

## 2014-09-29 NOTE — ED Notes (Signed)
Admitting at bedside 

## 2014-09-30 DIAGNOSIS — I5032 Chronic diastolic (congestive) heart failure: Secondary | ICD-10-CM

## 2014-09-30 DIAGNOSIS — I5033 Acute on chronic diastolic (congestive) heart failure: Secondary | ICD-10-CM

## 2014-09-30 DIAGNOSIS — D696 Thrombocytopenia, unspecified: Secondary | ICD-10-CM

## 2014-09-30 DIAGNOSIS — I10 Essential (primary) hypertension: Secondary | ICD-10-CM

## 2014-09-30 DIAGNOSIS — R531 Weakness: Secondary | ICD-10-CM

## 2014-09-30 DIAGNOSIS — N189 Chronic kidney disease, unspecified: Secondary | ICD-10-CM

## 2014-09-30 DIAGNOSIS — E039 Hypothyroidism, unspecified: Secondary | ICD-10-CM

## 2014-09-30 DIAGNOSIS — J9611 Chronic respiratory failure with hypoxia: Secondary | ICD-10-CM

## 2014-09-30 DIAGNOSIS — N179 Acute kidney failure, unspecified: Secondary | ICD-10-CM

## 2014-09-30 DIAGNOSIS — J9612 Chronic respiratory failure with hypercapnia: Secondary | ICD-10-CM

## 2014-09-30 DIAGNOSIS — G894 Chronic pain syndrome: Secondary | ICD-10-CM

## 2014-09-30 DIAGNOSIS — M797 Fibromyalgia: Secondary | ICD-10-CM

## 2014-09-30 DIAGNOSIS — E119 Type 2 diabetes mellitus without complications: Secondary | ICD-10-CM

## 2014-09-30 DIAGNOSIS — S82851S Displaced trimalleolar fracture of right lower leg, sequela: Secondary | ICD-10-CM

## 2014-09-30 LAB — HEMOGLOBIN A1C
Hgb A1c MFr Bld: 6.6 % — ABNORMAL HIGH (ref 4.8–5.6)
Mean Plasma Glucose: 143 mg/dL

## 2014-09-30 LAB — BASIC METABOLIC PANEL
Anion gap: 10 (ref 5–15)
BUN: 55 mg/dL — ABNORMAL HIGH (ref 6–20)
CALCIUM: 8.8 mg/dL — AB (ref 8.9–10.3)
CHLORIDE: 95 mmol/L — AB (ref 101–111)
CO2: 31 mmol/L (ref 22–32)
CREATININE: 1.28 mg/dL — AB (ref 0.44–1.00)
GFR calc Af Amer: 48 mL/min — ABNORMAL LOW (ref >60)
GFR calc non Af Amer: 41 mL/min — ABNORMAL LOW (ref >60)
Glucose, Bld: 91 mg/dL (ref 65–99)
POTASSIUM: 4.4 mmol/L (ref 3.5–5.1)
Sodium: 136 mmol/L (ref 135–145)

## 2014-09-30 LAB — BLOOD GAS, ARTERIAL
ACID-BASE EXCESS: 5.2 mmol/L — AB (ref 0.0–2.0)
Bicarbonate: 29.9 mEq/L — ABNORMAL HIGH (ref 20.0–24.0)
Drawn by: 39899
O2 Content: 2 L/min
O2 SAT: 94.9 %
PCO2 ART: 50.4 mmHg — AB (ref 35.0–45.0)
PH ART: 7.391 (ref 7.350–7.450)
Patient temperature: 98.6
TCO2: 31.5 mmol/L (ref 0–100)
pO2, Arterial: 76 mmHg — ABNORMAL LOW (ref 80.0–100.0)

## 2014-09-30 LAB — GLUCOSE, CAPILLARY
GLUCOSE-CAPILLARY: 254 mg/dL — AB (ref 65–99)
Glucose-Capillary: 88 mg/dL (ref 65–99)
Glucose-Capillary: 147 mg/dL — ABNORMAL HIGH (ref 65–99)
Glucose-Capillary: 176 mg/dL — ABNORMAL HIGH (ref 65–99)

## 2014-09-30 LAB — CBC
HCT: 26.3 % — ABNORMAL LOW (ref 36.0–46.0)
HEMOGLOBIN: 8.3 g/dL — AB (ref 12.0–15.0)
MCH: 24.9 pg — AB (ref 26.0–34.0)
MCHC: 31.6 g/dL (ref 30.0–36.0)
MCV: 78.7 fL (ref 78.0–100.0)
Platelets: 91 10*3/uL — ABNORMAL LOW (ref 150–400)
RBC: 3.34 MIL/uL — AB (ref 3.87–5.11)
RDW: 14.8 % (ref 11.5–15.5)
WBC: 6.7 10*3/uL (ref 4.0–10.5)

## 2014-09-30 LAB — PROTIME-INR
INR: 1.16 (ref 0.00–1.49)
Prothrombin Time: 15 seconds (ref 11.6–15.2)

## 2014-09-30 LAB — HEPATIC FUNCTION PANEL
ALBUMIN: 3 g/dL — AB (ref 3.5–5.0)
ALK PHOS: 92 U/L (ref 38–126)
ALT: 16 U/L (ref 14–54)
AST: 19 U/L (ref 15–41)
BILIRUBIN TOTAL: 0.6 mg/dL (ref 0.3–1.2)
Bilirubin, Direct: 0.1 mg/dL (ref 0.1–0.5)
Indirect Bilirubin: 0.5 mg/dL (ref 0.3–0.9)
TOTAL PROTEIN: 6.2 g/dL — AB (ref 6.5–8.1)

## 2014-09-30 MED ORDER — MOMETASONE FURO-FORMOTEROL FUM 200-5 MCG/ACT IN AERO
2.0000 | INHALATION_SPRAY | Freq: Two times a day (BID) | RESPIRATORY_TRACT | Status: DC
  Administered 2014-09-30 – 2014-10-02 (×4): 2 via RESPIRATORY_TRACT
  Filled 2014-09-30: qty 8.8

## 2014-09-30 MED ORDER — OXYCODONE-ACETAMINOPHEN 5-325 MG PO TABS
1.0000 | ORAL_TABLET | ORAL | Status: AC
  Administered 2014-09-30: 1 via ORAL
  Filled 2014-09-30: qty 1

## 2014-09-30 MED ORDER — MORPHINE SULFATE ER 15 MG PO TBCR
15.0000 mg | EXTENDED_RELEASE_TABLET | Freq: Every morning | ORAL | Status: DC
  Administered 2014-09-30 – 2014-10-02 (×3): 15 mg via ORAL
  Filled 2014-09-30 (×3): qty 1

## 2014-09-30 MED ORDER — IPRATROPIUM-ALBUTEROL 0.5-2.5 (3) MG/3ML IN SOLN
3.0000 mL | Freq: Four times a day (QID) | RESPIRATORY_TRACT | Status: DC
  Administered 2014-09-30 – 2014-10-01 (×5): 3 mL via RESPIRATORY_TRACT
  Filled 2014-09-30 (×5): qty 3

## 2014-09-30 MED ORDER — PANTOPRAZOLE SODIUM 40 MG PO TBEC
40.0000 mg | DELAYED_RELEASE_TABLET | Freq: Two times a day (BID) | ORAL | Status: DC
  Administered 2014-09-30 – 2014-10-02 (×4): 40 mg via ORAL
  Filled 2014-09-30 (×4): qty 1

## 2014-09-30 MED ORDER — SORBITOL 70 % SOLN
20.0000 mL | Freq: Every day | Status: DC | PRN
  Filled 2014-09-30: qty 30

## 2014-09-30 NOTE — Consult Note (Signed)
CARDIOLOGY CONSULT NOTE   Patient ID: Brooke Mccullough MRN: [id number] DOB/AGE: 70-18-1947 70 y.o.  Admit date: 09/29/2014  Primary Physician   Tammi Sou, MD Primary Cardiologist   Dr. Stanford Breed  Reason for Consultation  CHF  HPI: Brooke Mccullough is a 69 y.o. female with a history of  chronic diastolic CHF grade 1, HTN, HL, multiple TIA 2012/2014, cardio-renal syndrome with baseline CKD III, chronic anemia, adrenal tumor, DM type II, no healing right ankle fracture, fibromyalgia and related chronic pain, obesity, COPD on 24/7 oxygen at home, who presented to [facility] ED 09/29/14 with progressive weakness and ongoing tremor over past 1 months prior to this admission.   Last seem by Dr. Anitra Lauth,  her PCP, 08/20/2014. Of his note, difficult to figure out unexplained brief period of increased SOB. Following work up for dyspnea negative of PE on CT angio of her chest 09/23/14 and no arrhthymias noted on 48 hours Holter monitor after the last appointment. Plan to possibly involement of pulmonologist or geriatrics specialist care. She is taking lasix 13m po at home. She had a chronic ankle edema and gained 20lb over the past 5-6 months. She uses three pillow at home with 24/7 home oxygen. Limited activity. Denies chest pain, palpation, nausea, vomiting or dizziness. Unable to find Dr. CJacalyn Lefevrenote in epic.   Her lasix and ACE I were hold yesterday due to acute on chronic renal injury. Creatinine improved to 1.28 from 1.58. Lytes normal. TSH normal.  Chest x-ray revealed increased right basilar subsegmental atelectasis consistent with recent diagnosis of right-sided pneumonia. EKG NSR with rate of 86. No acute abnormality  Today cardiology is consulted for diuresis management in a setting of cardiorenal syndrome.   Past Medical History  Diagnosis Date  . COPD (chronic obstructive pulmonary disease)     nocturnal oxygen, plus daytime use with activity  . IBS (irritable bowel syndrome)   . Edema    . Gallstones     asymptomatic per pt (as of 09/30/2013); mild chronic elevation of Alk phos (remainder of hepatic panel wnl).  . Hernia of abdominal wall   . Hypertension   . Hyperlipidemia   . On home oxygen therapy     "3.4 depending on how I feel; 24/7" (09/29/2014)  . Adrenal benign tumor   . Anemia 2015    Baseline Hb 11, normocytic.  .Marland KitchenGERD (gastroesophageal reflux disease)   . History of duodenal ulcer   . TIA (transient ischemic attack) 2012; 2014    left facial and left arm numbness since  . Chronic lower back pain     "L5-S1" (09/29/2014)  . Gout   . Anxiety   . Depression   . Tremor     "upper extremities; last 6-7 months" (09/28/2013)  . Multinodular goiter summer 2015    FNA 157/0/17"Follicular lesion of undetermined significance": a follicular lesion/neoplasm can not be ruled out--Dr. GCruzita Ledererrecommended repeat bx in 6-12 mo  . Chronic diastolic CHF (congestive heart failure) 2015  . Pneumonia "once"  . Closed right trimalleolar fracture     Surgery/fixation hardware placed.  .Marland KitchenUnspecified hypothyroidism 12/21/2013  . Type II diabetes mellitus     hx of good control per pt report + HbA1c <7% June 2015.  .Marland KitchenHistory of hiatal hernia   . Migraine     "@ least monthly" (09/29/2014)  . Degenerative disc disease     Cervical and lumbar  . Osteoarthritis (arthritis due to wear and tear of joints)     "  back; knees; elbows; left ankle" (09/29/2014)  . Fibromyalgia     Dx'd 1993.  This has impaired her significantly.  . Cancer of right breast 2008    Lumpectomy + radiation+aromatase inhibitor (femara).  Regular f/u and mammos normal.     Past Surgical History  Procedure Laterality Date  . Lumbar laminectomy  1991; 1996  . Orif ankle fracture bimalleolar Left 08/2011  . Breast biopsy Right 2008  . Breast lumpectomy Right 2008  . Cataract extraction w/ intraocular lens implant Right 08/2013  . Cardiac catheterization  2000's X 2  . Combined hysterectomy vaginal / oophorectomy  / a&p repair / sacrospinous ligament suspension  03/1989  . Orif ankle fracture Right 02/11/2014    Procedure: OPEN REDUCTION INTERNAL FIXATION (ORIF) ANKLE FRACTURE;  Surgeon: Alta Corning, MD;  Location: [place];  Service: Orthopedics;  Laterality: Right;  . Transthoracic echocardiogram  03/2014    Mild LVH, EF 60-65%, grade I diast dysfxn.  Mild MR.  . Holter monitor  08/2014    NORMAL  . Back surgery    . Fracture surgery    . Vaginal hysterectomy    . Biopsy thyroid  05/2014    "sample wasn't good; will wait 6 months and repeat biopsy"    Allergies  Allergen Reactions  . Ciprofloxacin Other (See Comments)    Unspecified--noted in old PCP's records.  . Clindamycin/Lincomycin Rash  . Fentanyl Rash    Duragesic Patch  . Indocin [Indomethacin] Rash  . Keflex [Cephalexin] Rash  . Latex Other (See Comments)    Unspecified: noted in old PCP's records.  . Penicillins Rash  . Vancomycin Rash    I have reviewed the patient's current medications . atorvastatin  40 mg Oral Daily  . citalopram  20 mg Oral Daily  . clarithromycin  500 mg Oral Daily  . clopidogrel  75 mg Oral Daily  . insulin aspart  0-5 Units Subcutaneous QHS  . insulin aspart  0-9 Units Subcutaneous TID WC  . insulin glargine  40 Units Subcutaneous BID  . letrozole  2.5 mg Oral Daily  . levothyroxine  100 mcg Oral QAC breakfast  . loratadine  10 mg Oral Daily  . magnesium oxide  1,000 mg Oral Daily  . Magnesium Oxide  1,000 mg Oral Daily  . mometasone-formoterol  2 puff Inhalation BID  . morphine  15 mg Oral q morning - 10a  . morphine  30 mg Oral QHS  . pantoprazole  40 mg Oral Daily  . pregabalin  150 mg Oral BID  . rOPINIRole  2 mg Oral BID  . tiotropium  18 mcg Inhalation Daily   . sodium chloride 10 mL/hr at 09/29/14 2126   albuterol, ALPRAZolam, alum & mag hydroxide-simeth, meclizine, ondansetron **OR** ondansetron (ZOFRAN) IV, oxyCODONE-acetaminophen, sorbitol  Prior to Admission medications     Medication Sig Start Date End Date Taking? Authorizing Provider  albuterol (PROVENTIL HFA;VENTOLIN HFA) 108 (90 BASE) MCG/ACT inhaler Inhale 2 puffs into the lungs every 6 (six) hours as needed for wheezing or shortness of breath. 10/10/13  Yes Domenic Polite, MD  albuterol (PROVENTIL) (2.5 MG/3ML) 0.083% nebulizer solution Take 3 mLs (2.5 mg total) by nebulization every 4 (four) hours as needed for wheezing or shortness of breath. 01/18/14  Yes Tammi Sou, MD  ALPRAZolam Duanne Moron) 0.5 MG tablet Take 1 tablet (0.5 mg total) by mouth 2 (two) times daily as needed for anxiety or sleep. Patient taking differently: Take 0.5 mg by mouth at  bedtime as needed for sleep.  07/06/14  Yes Tammi Sou, MD  atorvastatin (LIPITOR) 40 MG tablet TAKE 1 TABLET BY MOUTH DAILY 07/20/14  Yes Tammi Sou, MD  cetirizine (ZYRTEC) 10 MG tablet Take 10 mg by mouth daily as needed for allergies.   Yes Historical Provider, MD  cholecalciferol (VITAMIN D) 1000 UNITS tablet Take 1,000 Units by mouth daily.   Yes Historical Provider, MD  citalopram (CELEXA) 20 MG tablet TAKE 1 TABLET BY MOUTH EVERY DAY 08/25/14  Yes Tammi Sou, MD  clarithromycin (BIAXIN) 500 MG tablet 1 tab po bid x 10 days 09/23/14  Yes Tammi Sou, MD  clopidogrel (PLAVIX) 75 MG tablet Take 1 tablet (75 mg total) by mouth daily. 06/17/14  Yes Tammi Sou, MD  furosemide (LASIX) 40 MG tablet Take 1.5 tablets (60 mg total) by mouth 2 (two) times daily. 08/02/14  Yes Doy Hutching, MD  insulin aspart (NOVOLOG) 100 UNIT/ML injection 3 U SQ qAC Patient taking differently: Inject 3 Units into the skin 3 (three) times daily with meals.  06/17/14  Yes Tammi Sou, MD  Insulin Glargine (LANTUS) 100 UNIT/ML Solostar Pen Inject 40 Units into the skin 2 (two) times daily. 08/30/14  Yes Tammi Sou, MD  letrozole [facility]) 2.5 MG tablet TAKE 1 TABLET BY MOUTH EVERY DAY 09/27/14  Yes Tammi Sou, MD  levothyroxine (SYNTHROID, LEVOTHROID)  100 MCG tablet Take 1 tablet (100 mcg total) by mouth daily before breakfast. 02/09/14  Yes Tammi Sou, MD  lisinopril (PRINIVIL,ZESTRIL) 10 MG tablet Take 1 tablet (10 mg total) by mouth daily. 11/12/13  Yes Tammi Sou, MD  Magnesium Oxide 500 MG (LAX) TABS Take 1,000 mg by mouth daily.    Yes Historical Provider, MD  meclizine (ANTIVERT) 12.5 MG tablet TAKE 1 TO 2 TABLETS BY MOUTH EVERY 6 HOURS AS NEEDED FOR NAUSEA OR DIZZINESS 09/08/14  Yes Tammi Sou, MD  morphine (MS CONTIN) 15 MG 12 hr tablet 1 tab po qAM and 2 tabs po qhs 08/06/14  Yes Tammi Sou, MD  Multiple Vitamins-Minerals (MULTIVITAMIN PO) Take 1 tablet by mouth daily.   Yes Historical Provider, MD  omeprazole (PRILOSEC) 40 MG capsule 1 tab po bid Patient taking differently: Take 40 mg by mouth 2 (two) times daily.  07/30/14  Yes Tammi Sou, MD  oxyCODONE-acetaminophen (PERCOCET/ROXICET) 5-325 MG per tablet Take 1 tablet by mouth every 6 (six) hours as needed for severe pain.   Yes Historical Provider, MD  pregabalin (LYRICA) 150 MG capsule Take 1 capsule (150 mg total) by mouth 2 (two) times daily. 12/01/13  Yes Tammi Sou, MD  rOPINIRole (REQUIP) 2 MG tablet 1 tab po bid Patient taking differently: Take 2 mg by mouth 2 (two) times daily.  07/08/14  Yes Tammi Sou, MD  SPIRIVA HANDIHALER 18 MCG inhalation capsule INHALE CONTENTS OF 1 CAPSULE USING HANDIHALER EVERY DAY 06/24/14  Yes Tammi Sou, MD  polyethylene glycol (MIRALAX / GLYCOLAX) packet Take 17 g by mouth daily. Patient not taking: Reported on 09/29/2014 04/29/14   Ripudeep Krystal Eaton, MD     History   Social History  . Marital Status: Married    Spouse Name: N/A  . Number of Children: N/A  . Years of Education: N/A   Occupational History  . Not on file.   Social History Main Topics  . Smoking status: Former Smoker -- 1.00 packs/day for 36 years  Types: Cigarettes    Quit date: 09/09/2013  . Smokeless tobacco: Never Used  .  Alcohol Use: No  . Drug Use: No  . Sexual Activity: Not Currently   Other Topics Concern  . Not on file   Social History Narrative   Married, 2 children (one son and one daughter).   Relocated from [place] 08/2013 to be closer to children.   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   Educ: HS and 2 yrs business college.   Tobacco: 36 pack-yr hx, quit 2015.   Alcohol: none.  No drugs.   Ambulated with walker prior to right ankle trimalleolar fracture and subsequent surgery 01/2014.          Family Status  Relation Status Death Age  . Mother Deceased   . Father Deceased   . Brother Alive   . Daughter Alive   . Son Alive   . Maternal Grandmother Deceased   . Maternal Grandfather Deceased   . Paternal Grandmother Deceased   . Paternal Grandfather Deceased    Family History  Problem Relation Age of Onset  . Arthritis Mother   . Breast cancer Mother   . Hyperlipidemia Mother   . Stroke Mother   . Hypertension Mother   . Mental illness Mother   . Arthritis Father   . Cancer Father   . Hyperlipidemia Father   . Heart disease Father   . Heart disease Maternal Grandmother   . Hyperlipidemia Maternal Grandmother   . Heart disease Paternal Grandmother   . Hypertension Paternal Grandmother   . Diabetes Paternal Grandmother   . Arthritis Paternal Grandfather      ROS:  Full 14 point review of systems complete and found to be negative unless listed above.  Physical Exam: Blood pressure 133/43, pulse 70, temperature 98 F (36.7 C), temperature source Oral, resp. rate 18, height 5' 4"  (1.626 m), weight 223 lb (101.152 kg), SpO2 97 %.  General: Well developed, well nourished, female in no acute distress Head: Eyes PERRLA, No xanthomas. Normocephalic and atraumatic, oropharynx without edema or exudate.  Lungs: Resp regular and unlabored. diminished breath sound to R  basilar with rales. No wheezing.  Heart: RRR no s3, s4, or murmurs..   Neck: No carotid  bruits. No lymphadenopathy. no  JVD. Abdomen: Bowel sounds present, abdomen soft and non-tender without masses or hernias noted. Msk:  No spine or cva tenderness. No weakness, no joint deformities or effusions. Extremities: No clubbing, cyanosis. 2+ edema, more at ankle and feet. DP/PT/Radials 2+ and equal bilaterally. Neuro: Alert and oriented X 3. No focal deficits noted. Psych:  Good affect, responds appropriately Skin: No rashes or lesions noted.  Labs:   Lab Results  Component Value Date   WBC 6.7 09/30/2014   HGB 8.3* 09/30/2014   HCT 26.3* 09/30/2014   MCV 78.7 09/30/2014   PLT 91* 09/30/2014    Recent Labs  09/30/14 0957  INR 1.16    Recent Labs Lab 09/30/14 0320 09/30/14 0957  NA 136  --   K 4.4  --   CL 95*  --   CO2 31  --   BUN 55*  --   CREATININE 1.28*  --   CALCIUM 8.8*  --   PROT  --  6.2*  BILITOT  --  0.6  ALKPHOS  --  92  ALT  --  16  AST  --  19  GLUCOSE 91  --   ALBUMIN  --  3.0*    Recent Labs  09/29/14 1155  TROPONINI <0.03   Echo: 03/2014   LV EF: 60% -  65%  ------------------------------------------------------------------- Indications:   CHF - 428.0.  ------------------------------------------------------------------- History:  PMH:  Chest pain. Dyspnea. Chronic obstructive pulmonary disease. Risk factors: Diabetes mellitus.  ------------------------------------------------------------------- Study Conclusions  - Left ventricle: E/e&'>14.8 suggestive of elevated LV filling pressures. There was mild concentric hypertrophy. Systolic function was normal. The estimated ejection fraction was in the range of 60% to 65%. There was an increased relative contribution of atrial contraction to ventricular filling. Doppler parameters are consistent with abnormal left ventricular relaxation (grade 1 diastolic dysfunction). - Aortic valve: Mildly calcified annulus. Mildly calcified leaflets. - Mitral valve:  Calcified annulus. There was mild regurgitation. - Left atrium: The atrium was mildly to moderately dilated.  ECG: NST, no acute ST or T wave change.   Radiology:  Ct Head Wo Contrast  09/29/2014   CLINICAL DATA:  Weakness and tremors.  EXAM: CT HEAD WITHOUT CONTRAST  TECHNIQUE: Contiguous axial images were obtained from the base of the skull through the vertex without intravenous contrast.  COMPARISON:  10/07/2013  FINDINGS: Stable mild atrophy and small vessel disease. Stable probable old lacunar infarct in the inferior aspect of the left basal ganglia. The brain demonstrates no evidence of hemorrhage, acute infarction, edema, mass effect, extra-axial fluid collection, hydrocephalus or mass lesion. The skull is unremarkable. Stable mucous retention cyst in the right maxillary antrum.  IMPRESSION: No acute abnormalities identified. Stable mild atrophy, small vessel disease and right basal ganglia lacunar infarct.   Electronically Signed   By: Aletta Edouard M.D.   On: 09/29/2014 12:29   Dg Chest Port 1 View  09/29/2014   CLINICAL DATA:  Cough.  Weakness.  Tremors.  COPD.  EXAM: PORTABLE CHEST - 1 VIEW  COMPARISON:  08/02/2014 and 04/20/2014  FINDINGS: Mild cardiomegaly remains stable. No evidence of congestive heart failure. Low lung volumes again noted as well as chronic elevation of right hemidiaphragm. Increased linear opacity is noted in the lateral right lung base, consistent with subsegmental atelectasis. No evidence of pulmonary consolidation.  IMPRESSION: Increased right basilar subsegmental atelectasis.  Stable cardiomegaly and chronic elevation of right hemidiaphragm.   Electronically Signed   By: [name] M.D.   On: 09/29/2014 11:35    ASSESSMENT AND PLAN:    Principal Problem:   Acute renal failure Active Problems:   Fibromyalgia   Chronic pain syndrome   Chronic renal insufficiency, stage III (moderate)   Hypothyroidism   HTN (hypertension), benign   Closed right trimalleolar  fracture   COPD (chronic obstructive pulmonary disease)   Chronic diastolic heart failure, NYHA class 1   Thrombocytopenia (chronic)   Diabetes type 2, controlled   Weakness   Chronic respiratory failure with hypoxia   1. Acute on chronic diastolic failure - Lasix and ACE on hold, creatinine improved. Net I/O positive 1.5L since admission. SOB stable. BNP normal. Echo 03/2014 showed LV EF of 60-65%; LVH; incrased ventricular filling pressure;  grade 1 DD; mild mitral reg; mild to moderate dilated L atrium. - She had a chronic leg edema, no JVD or significant rales. Will recommended lasix 74m po BID. Consider adding selective BB like bisoprolol.   2. Acute on chronic renal insufficieny, stage 3 - held lasix and ACE, creatinine improved. As above.   3. HTN - antihypertensive regiment held. As above.   4. HL - Continue statin.   5. DM - As primary  6. Chronic dyspnea/ COPD - pulmonary to follow  7. Multiple TIAs -Continue Plavix    Signed: Geovanna Simko, PA 09/30/2014, 3:56 PM Pager [phone]  Co-Sign MD

## 2014-09-30 NOTE — Evaluation (Signed)
Physical Therapy Evaluation Patient Details Name: Brooke Mccullough MRN: [id number] DOB: 1946-03-08 Today's Date: 09/30/2014   History of Present Illness  69 year old female with multiple medical comorbiditeis including COPD, chronic diastolic CHF grade 1, no healing right ankle fracture, fibromyalgia and related chronic pain, obesity who presented to [facility] ED with progressive weakness and ongoing tremors, tremors have gotten worse over past few days and pt could barely bear weight and/or ambulate.  Clinical Impression  Patient demonstrates deficits in functional mobility as indicated below. Will need continued skilled PT to address deficits and maximize function. Will see as indicated and progress as tolerated. Recommend resuming HHPT upon acute discharge.    Follow Up Recommendations Home health PT;Supervision/Assistance - 24 hour    Equipment Recommendations  None recommended by PT    Recommendations for Other Services       Precautions / Restrictions Precautions Precautions: Fall;Other (comment) (aircast brace left ankle) Precaution Comments: unheeled ankle fx RLE       Mobility  Bed Mobility Overal bed mobility: Needs Assistance Bed Mobility: Supine to Sit;Sit to Supine     Supine to sit: Min assist Sit to supine: Min assist      Transfers Overall transfer level: Needs assistance Equipment used: Rolling walker (2 wheeled) Transfers: Sit to/from [facility] Sit to Stand: Min assist Stand pivot transfers: Min assist       General transfer comment: Min assist for elevation to upright and for stability during transfer  Ambulation/Gait Ambulation/Gait assistance: Min assist Ambulation Distance (Feet): 6 Feet Assistive device: Rolling walker (2 wheeled) Gait Pattern/deviations: Step-to pattern        Stairs            Wheelchair Mobility    Modified Rankin (Stroke Patients Only)       Balance Overall balance assessment: History of  Falls                                           Pertinent Vitals/Pain      Home Living Family/patient expects to be discharged to:: Private residence Living Arrangements: Spouse/significant other Available Help at Discharge: Family;Available 24 hours/day Type of Home: Apartment Home Access: Stairs to enter     Home Layout: One level Home Equipment: Walker - 2 wheels;Grab bars - toilet;Grab bars - tub/shower;Shower seat - built in;Wheelchair - manual Additional Comments: lives at [employer] [facility] and is attending rehab since R ankle fracture    Prior Function           Comments: pt has walk in shower with built in seat and handicap bars around toilet and shower with elevated toilet; walked in apartment with RW independently, used WC for long distances     Hand Dominance   Dominant Hand: Right    Extremity/Trunk Assessment               Lower Extremity Assessment: RLE deficits/detail      Cervical / Trunk Assessment:  (increased body habitus)  Communication   Communication: No difficulties  Cognition Arousal/Alertness: Awake/alert Behavior During Therapy: WFL for tasks assessed/performed Overall Cognitive Status: Within Functional Limits for tasks assessed                      General Comments      Exercises        Assessment/Plan    PT Assessment  Patient needs continued PT services  PT Diagnosis Difficulty walking;Abnormality of gait;Generalized weakness;Acute pain   PT Problem List Decreased strength;Decreased range of motion;Decreased activity tolerance;Decreased balance;Decreased mobility;Obesity;Pain  PT Treatment Interventions DME instruction;Gait training;Functional mobility training;Therapeutic activities;Therapeutic exercise;Balance training;Patient/family education   PT Goals (Current goals can be found in the Care Plan section) Acute Rehab PT Goals Patient Stated Goal: to go home PT Goal Formulation: With  patient Time For Goal Achievement: 10/14/14 Potential to Achieve Goals: Good    Frequency Min 3X/week   Barriers to discharge        Co-evaluation               End of Session Equipment Utilized During Treatment: Oxygen Activity Tolerance: No increased pain;Patient limited by fatigue Patient left: in bed;with call bell/phone within reach;with bed alarm set Nurse Communication: Mobility status         Time: 5681-2751 PT Time Calculation (min) (ACUTE ONLY): 18 min   Charges:   PT Evaluation $Initial PT Evaluation Tier I: 1 Procedure     PT G CodesDuncan Dull 10/19/14, 3:46 PM  Alben Deeds, [place] DPT  [phone]

## 2014-09-30 NOTE — Progress Notes (Signed)
[facility]  Patient Status: Active (receiving services up to time of hospitalization)  [facility] is providing the following services: PT and HHA  If patient discharges after hours, please call [phone].   Edwinna Areola 09/30/2014, 12:51 PM

## 2014-09-30 NOTE — Progress Notes (Addendum)
Brooke Mccullough VOJ:[id number] DOB: 12-15-1945 DOA: 09/29/2014 PCP: Tammi Sou, MD  Brief narrative:  33 ? recent R ankle # and prolonged CIR stay till 05/2014, DM ty II on insulin, Htn, HLd, COPD Gold stage III-IV [end stage per PCP?] on night time O2, Diastolic HF foll by Dr. Stanford Breed Brooke Mccullough weight 208?]-last echo 04/22/14=EF 60-65% prior breast CA s/p Lumpectomy 2008, TMN goiter follwed by Dr. [name], Multiple TIA 2012/2014, Cardio-renal syndrome with baseline CKD III long-standing anemia-TCP Fibromyalgia  1993, Chronic pain followed by Dr. [name] admitted with inability to bear weight as well as overall weakness  Past medical history-As per Problem list Chart reviewed as below-   Consultants:  Pulmonary  Procedures:  none  Antibiotics:  none   Subjective   Feels fair. Constipated No nausea no vomiting tolerating diet Weakness seems to be a little better No chest pain No nausea No vomiting States that the pain in her foot is about baseline    Objective    Interim History:   Telemetry: Sinus   Objective: Filed Vitals:   09/30/14 0533 09/30/14 0833 09/30/14 0939 09/30/14 1000  BP: 132/42  133/43   Pulse: 69  70   Temp: 97.9 F (36.6 C)  98 F (36.7 C)   TempSrc: Oral  Oral   Resp: 16  18   Height:    5\' 4"  (1.626 m)  Weight:    101.152 kg (223 lb)  SpO2: 97% 98% 97%     Intake/Output Summary (Last 24 hours) at 09/30/14 1250 Last data filed at 09/30/14 0927  Gross per 24 hour  Intake 1746.67 ml  Output    450 ml  Net 1296.67 ml    Exam:  General: EOMI NCAT Morbid obesity, Body mass index is 38.26 kg/(m^2).  Cardiovascular: S1-S2 regular rate rhythm Respiratory: Clinically clear no rales no rhonchi Abdomen: 0 soft nontender obese nondistended Skin evidences of chronic venous insufficiency with stasis dermatitis as well as grade 1 lower extremity edema Neuro grossly intact tremor  Data Reviewed: Basic Metabolic Panel:  Recent  Labs Lab 09/29/14 1155 09/30/14 0320  NA 135 136  K 4.9 4.4  CL 94* 95*  CO2 32 31  GLUCOSE 106* 91  BUN 62* 55*  CREATININE 1.58* 1.28*  CALCIUM 9.2 8.8*   Liver Function Tests:  Recent Labs Lab 09/30/14 0957  AST 19  ALT 16  ALKPHOS 92  BILITOT 0.6  PROT 6.2*  ALBUMIN 3.0*   No results for input(s): LIPASE, AMYLASE in the last 168 hours. No results for input(s): AMMONIA in the last 168 hours. CBC:  Recent Labs Lab 09/29/14 1155 09/30/14 0320  WBC 13.3* 6.7  HGB 9.4* 8.3*  HCT 29.4* 26.3*  MCV 78.0 78.7  PLT 93* 91*   Cardiac Enzymes:  Recent Labs Lab 09/29/14 1155  TROPONINI <0.03   BNP: Invalid input(s): POCBNP CBG:  Recent Labs Lab 09/29/14 1706 09/29/14 2137 09/30/14 0807 09/30/14 1210  GLUCAP 73 143* 88 147*    No results found for this or any previous visit (from the past 240 hour(s)).   Studies:              All Imaging reviewed and is as per above notation   Scheduled Meds: . atorvastatin  40 mg Oral Daily  . citalopram  20 mg Oral Daily  . clarithromycin  500 mg Oral Daily  . clopidogrel  75 mg Oral Daily  . insulin aspart  0-5 Units Subcutaneous QHS  . insulin  aspart  0-9 Units Subcutaneous TID WC  . insulin glargine  40 Units Subcutaneous BID  . letrozole  2.5 mg Oral Daily  . levothyroxine  100 mcg Oral QAC breakfast  . loratadine  10 mg Oral Daily  . magnesium oxide  1,000 mg Oral Daily  . Magnesium Oxide  1,000 mg Oral Daily  . morphine  15 mg Oral q morning - 10a  . morphine  30 mg Oral QHS  . pantoprazole  40 mg Oral Daily  . pregabalin  150 mg Oral BID  . rOPINIRole  2 mg Oral BID  . tiotropium  18 mcg Inhalation Daily   Continuous Infusions: . sodium chloride 10 mL/hr at 09/29/14 2126     Assessment/Plan: 1.  Multifactorial ATS grade 3 dyspnea-smoker X-37 years quit 8768, diastolic heart failure and probable restrictive component. See below 2. Stage 3 Gold COPD on nocturnal oxygen-pulmonology consulted.  Defer PFTs at present time. Continue tiotropium 18 g daily, change albuterol to 5 mg every 4 when necessary wheeze, add Dulera 200 g 2 puffs twice a day. I do not think she needs a steroidal burst. 3. Acute decompensated diastolic heart failure-last echocardiogram = 03/2014 = EF 60-65%, dry weight? 208, currently 223 pounds. Unfortunately has also cardiorenal syndrome making diuresis challenging. We will saline lock. Her Lasix 60 twice a day is on hold.  We might need to involve cardiology in her care. 4. Acute kidney injury-see above discussion. Baseline CK D stage III -will need to keep volumes even. Repeat labs in am. 5. Toxic multinodular goiter-outpatient follow-up with endocrinology-apparently has had treatment? Currently on Synthroid 100 g daily 6. Multiple TIAs 2012/2014-continue Plavix 75 daily 7. Breast cancer status post lumpectomy 2008-continue letrozole 2.5 daily 8. Hypertension-lisinopril 10 mg daily on hold because of #4 going forward may not benefit from this and we will discontinue this and she may require a beta blocker if okayed by pulmonology 9. Hyperlipidemia-continue atorvastatin 40 mg daily for secondary prevention 10. Right nonhealing wound to foot-see's [facility] for second opinion-status post surgery 05/2014-continue morphine MS Contin 15 mg every 12-has a pain management physician Dr. [name] who she will follow up with. No pain medications will be refilled on discharge from hospital. Continue Lyrica 150 twice a day 11. Restless leg syndrome continue Requip 2 mg 1 tab twice a day 12. ThromboCytopenia acute/chronic-1.1% of cases, class effect of letrozole-follow-up as an outpatient. No heparinois abdomen tests the patient had no what was initially he was wondering if he can eat what is okay and the bleeding and a. SCDs  13. Adult failure to thrive-complex situation. Carries a diagnosis of fibromyalgia since 1993, multiple mood medications and also documented  history in chart of patient's gradual decline since surgery. I fear that she's coming to the end of her chronological lifespan. We will support her however we can   Code Status:  full Family Communication:  no family present at bedside Disposition Plan:  inpatient   [name], MD  [facility] Pager [phone] 09/30/2014, 12:50 PM    LOS: 1 day

## 2014-09-30 NOTE — Consult Note (Signed)
PULMONARY / CRITICAL CARE MEDICINE   Name: Brooke Mccullough MRN: [id number] DOB: 1945/05/30    ADMISSION DATE:  09/29/2014 CONSULTATION DATE:  09/30/2014   REFERRING MD :  Dr [name]   CHIEF COMPLAINT:  Copd - chronic resp failure - consult   HISTORY OF PRESENT ILLNESS:   69 year old obese/overweight female. Brooke Mccullough bird relocated from [place] a year ago (son lives in [place]) [place] year. Carries diagnosis of COPD NOS (used to a Dr Idolina Primer or Damita Dunnings). Been on o2 for several years but says self weaned it off based on subjective assessment around 2 years ago but apparently on moving to [facility] PCP MCGOWEN,PHILIP H, MD assessed her to need 24/7 02 and she is on 2L. She has been fairly less mobile after all and RT ankle fracture with CIR Stay through feb 2016 (She uses walker). In addtion has chronic pain, fibromylagia,. TIA hx, diag chf, chronic pedal; edema etc., . Admitted 09/29/2014 for weakness, tremors and difficulty standing. Found to have WBC 13K and mild AKI with creat 1.58mg % (baseline 0.9mg %), normal trop. CXR Rt sided infiltrate v atelectasis per radiology (but per PCCM MD this is all right diaph paralyuss). PCCM consulted for copd and chronic resp failure consultation. Her PCP has intended for her to see a pulmonary doc. Patient currently endorses no acute worsening.     PAST MEDICAL HISTORY :   has a past medical history of COPD (chronic obstructive pulmonary disease); IBS (irritable bowel syndrome); Edema; Gallstones; Hernia of abdominal wall; Hypertension; Hyperlipidemia; On home oxygen therapy; Adrenal benign tumor; Anemia (2015); GERD (gastroesophageal reflux disease); History of duodenal ulcer; TIA (transient ischemic attack) (2012; 2014); Chronic lower back pain; Gout; Anxiety; Depression; Tremor; Multinodular goiter (summer 2015); Chronic diastolic CHF (congestive heart failure) (2015); Pneumonia ("once"); Closed right trimalleolar fracture; Unspecified hypothyroidism (12/21/2013); Type II  diabetes mellitus; History of hiatal hernia; Migraine; Degenerative disc disease; Osteoarthritis (arthritis due to wear and tear of joints); Fibromyalgia; and Cancer of right breast (2008).  has past surgical history that includes Lumbar laminectomy (1991; 1996); ORIF ankle fracture bimalleolar (Left, 08/2011); Breast biopsy (Right, 2008); Breast lumpectomy (Right, 2008); Cataract extraction w/ intraocular lens implant (Right, 08/2013); Cardiac catheterization (2000's X 2); Combined hysterectomy vaginal / oophorectomy / A&P repair / sacrospinous ligament suspension (03/1989); ORIF ankle fracture (Right, 02/11/2014); transthoracic echocardiogram (03/2014); HOLTER MONITOR (08/2014); Back surgery; Fracture surgery; Vaginal hysterectomy; and Biopsy thyroid (05/2014). Prior to Admission medications   Medication Sig Start Date End Date Taking? Authorizing Provider  albuterol (PROVENTIL HFA;VENTOLIN HFA) 108 (90 BASE) MCG/ACT inhaler Inhale 2 puffs into the lungs every 6 (six) hours as needed for wheezing or shortness of breath. 10/10/13  Yes Domenic Polite, MD  albuterol (PROVENTIL) (2.5 MG/3ML) 0.083% nebulizer solution Take 3 mLs (2.5 mg total) by nebulization every 4 (four) hours as needed for wheezing or shortness of breath. 01/18/14  Yes Tammi Sou, MD  ALPRAZolam Duanne Moron) 0.5 MG tablet Take 1 tablet (0.5 mg total) by mouth 2 (two) times daily as needed for anxiety or sleep. Patient taking differently: Take 0.5 mg by mouth at bedtime as needed for sleep.  07/06/14  Yes Tammi Sou, MD  atorvastatin (LIPITOR) 40 MG tablet TAKE 1 TABLET BY MOUTH DAILY 07/20/14  Yes Tammi Sou, MD  cetirizine (ZYRTEC) 10 MG tablet Take 10 mg by mouth daily as needed for allergies.   Yes Historical Provider, MD  cholecalciferol (VITAMIN D) 1000 UNITS tablet Take 1,000 Units by mouth daily.  Yes Historical Provider, MD  citalopram (CELEXA) 20 MG tablet TAKE 1 TABLET BY MOUTH EVERY DAY 08/25/14  Yes Tammi Sou, MD   clarithromycin (BIAXIN) 500 MG tablet 1 tab po bid x 10 days 09/23/14  Yes Tammi Sou, MD  clopidogrel (PLAVIX) 75 MG tablet Take 1 tablet (75 mg total) by mouth daily. 06/17/14  Yes Tammi Sou, MD  furosemide (LASIX) 40 MG tablet Take 1.5 tablets (60 mg total) by mouth 2 (two) times daily. 08/02/14  Yes Doy Hutching, MD  insulin aspart (NOVOLOG) 100 UNIT/ML injection 3 U SQ qAC Patient taking differently: Inject 3 Units into the skin 3 (three) times daily with meals.  06/17/14  Yes Tammi Sou, MD  Insulin Glargine (LANTUS) 100 UNIT/ML Solostar Pen Inject 40 Units into the skin 2 (two) times daily. 08/30/14  Yes Tammi Sou, MD  letrozole [facility]) 2.5 MG tablet TAKE 1 TABLET BY MOUTH EVERY DAY 09/27/14  Yes Tammi Sou, MD  levothyroxine (SYNTHROID, LEVOTHROID) 100 MCG tablet Take 1 tablet (100 mcg total) by mouth daily before breakfast. 02/09/14  Yes Tammi Sou, MD  lisinopril (PRINIVIL,ZESTRIL) 10 MG tablet Take 1 tablet (10 mg total) by mouth daily. 11/12/13  Yes Tammi Sou, MD  Magnesium Oxide 500 MG (LAX) TABS Take 1,000 mg by mouth daily.    Yes Historical Provider, MD  meclizine (ANTIVERT) 12.5 MG tablet TAKE 1 TO 2 TABLETS BY MOUTH EVERY 6 HOURS AS NEEDED FOR NAUSEA OR DIZZINESS 09/08/14  Yes Tammi Sou, MD  morphine (MS CONTIN) 15 MG 12 hr tablet 1 tab po qAM and 2 tabs po qhs 08/06/14  Yes Tammi Sou, MD  Multiple Vitamins-Minerals (MULTIVITAMIN PO) Take 1 tablet by mouth daily.   Yes Historical Provider, MD  omeprazole (PRILOSEC) 40 MG capsule 1 tab po bid Patient taking differently: Take 40 mg by mouth 2 (two) times daily.  07/30/14  Yes Tammi Sou, MD  oxyCODONE-acetaminophen (PERCOCET/ROXICET) 5-325 MG per tablet Take 1 tablet by mouth every 6 (six) hours as needed for severe pain.   Yes Historical Provider, MD  pregabalin (LYRICA) 150 MG capsule Take 1 capsule (150 mg total) by mouth 2 (two) times daily. 12/01/13  Yes Tammi Sou, MD   rOPINIRole (REQUIP) 2 MG tablet 1 tab po bid Patient taking differently: Take 2 mg by mouth 2 (two) times daily.  07/08/14  Yes Tammi Sou, MD  SPIRIVA HANDIHALER 18 MCG inhalation capsule INHALE CONTENTS OF 1 CAPSULE USING HANDIHALER EVERY DAY 06/24/14  Yes Tammi Sou, MD  polyethylene glycol (MIRALAX / GLYCOLAX) packet Take 17 g by mouth daily. Patient not taking: Reported on 09/29/2014 04/29/14   Ripudeep Krystal Eaton, MD   Allergies  Allergen Reactions  . Ciprofloxacin Other (See Comments)    Unspecified--noted in old PCP's records.  . Clindamycin/Lincomycin Rash  . Fentanyl Rash    Duragesic Patch  . Indocin [Indomethacin] Rash  . Keflex [Cephalexin] Rash  . Latex Other (See Comments)    Unspecified: noted in old PCP's records.  . Penicillins Rash  . Vancomycin Rash    FAMILY HISTORY:  indicated that her mother is deceased. She indicated that her father is deceased. She indicated that her brother is alive. She indicated that her maternal grandmother is deceased. She indicated that her maternal grandfather is deceased. She indicated that her paternal grandmother is deceased. She indicated that her paternal grandfather is deceased. She indicated that her daughter is  alive. She indicated that her son is alive.  SOCIAL HISTORY:  reports that she quit smoking about 12 months ago. Her smoking use included Cigarettes. She has a 36 pack-year smoking history. She has never used smokeless tobacco. She reports that she does not drink alcohol or use illicit drugs.  REVIEW OF SYSTEMS:    SUBJECTIVE:   VITAL SIGNS: Temp:  [97.9 F (36.6 C)-98.6 F (37 C)] 98 F (36.7 C) (06/09 1400) Pulse Rate:  [69-106] 106 (06/09 1400) Resp:  [16-22] 22 (06/09 1400) BP: (113-179)/(42-71) 179/71 mmHg (06/09 1400) SpO2:  [96 %-100 %] 98 % (06/09 1400) Weight:  [101.152 kg (223 lb)] 101.152 kg (223 lb) (06/09 1000) HEMODYNAMICS:   VENTILATOR SETTINGS:   INTAKE / OUTPUT:  Intake/Output Summary  (Last 24 hours) at 09/30/14 1729 Last data filed at 09/30/14 1400  Gross per 24 hour  Intake 1831.67 ml  Output    450 ml  Net 1381.67 ml    PHYSICAL EXAMINATION: General:  Obese. Lying in bed,. Looks deconditioned Neuro:  AxOX3. Speech normal.  HEENT:  Nasal cannula on.Neck supple Cardiovascular:  S1S2+. No murmurs Lungs:  CTA bilaterally but only anteriorly examined. No disterss Abdomen:  Obese, soft, normal bowel sounds. No mas Musculoskeletal:  Rt side in ankle boot. Bialteral edema + Skin:  intact  LABS:  CBC  Recent Labs Lab 09/29/14 1155 09/30/14 0320  WBC 13.3* 6.7  HGB 9.4* 8.3*  HCT 29.4* 26.3*  PLT 93* 91*   Coag's  Recent Labs Lab 09/30/14 0957  INR 1.16   BMET  Recent Labs Lab 09/29/14 1155 09/30/14 0320  NA 135 136  K 4.9 4.4  CL 94* 95*  CO2 32 31  BUN 62* 55*  CREATININE 1.58* 1.28*  GLUCOSE 106* 91   Electrolytes  Recent Labs Lab 09/29/14 1155 09/30/14 0320  CALCIUM 9.2 8.8*   Sepsis Markers No results for input(s): LATICACIDVEN, PROCALCITON, O2SATVEN in the last 168 hours. ABG No results for input(s): PHART, PCO2ART, PO2ART in the last 168 hours. Liver Enzymes  Recent Labs Lab 09/30/14 0957  AST 19  ALT 16  ALKPHOS 92  BILITOT 0.6  ALBUMIN 3.0*   Cardiac Enzymes  Recent Labs Lab 09/29/14 1155  TROPONINI <0.03   Glucose  Recent Labs Lab 09/29/14 1706 09/29/14 2137 09/30/14 0807 09/30/14 1210  GLUCAP 73 143* 88 147*    Imaging No results found. CT chst 09/23/14 - personally visualized image - Significant Rt diaph elevation.  PE negative  ASSESSMENT / PLAN:  PULMONARY  A: Chronic resp failure - hypoxemic 2L in seting 30 ppd smoking  - new problem for PCCM MD but stable for patient  - likely copd based  on hx; unable to test due to her pain and ankel issues  - also has right diaphragm paralysis with ass atelectasiss -  - I do not think she has pneumonia based on hx and imaging  = high risk PE but  ruled out 07/28/14  = at ris cor pulmonale   P:   o2 for pusle ox > 88% duoneb schedule Duplex LE - rule out edema due to her edema Check ABG Cannot do anything more curerntly due to bedridden state  - such as PFT to assses severity  - might be worth repeating echo      Dr. Brand Males, M.D., [employer]C.P Pulmonary and Critical Care Medicine Staff Physician [facility] Pager: [phone], If no answer or between  15:00h - 7:00h: call [phone]  [phone]  09/30/2014 5:52 PM

## 2014-10-01 ENCOUNTER — Inpatient Hospital Stay (HOSPITAL_COMMUNITY): Payer: Medicare Other

## 2014-10-01 ENCOUNTER — Ambulatory Visit: Payer: Medicare Other | Admitting: Family Medicine

## 2014-10-01 DIAGNOSIS — M7989 Other specified soft tissue disorders: Secondary | ICD-10-CM

## 2014-10-01 LAB — GLUCOSE, CAPILLARY
GLUCOSE-CAPILLARY: 215 mg/dL — AB (ref 65–99)
GLUCOSE-CAPILLARY: 195 mg/dL — AB (ref 65–99)
Glucose-Capillary: 117 mg/dL — ABNORMAL HIGH (ref 65–99)
Glucose-Capillary: 173 mg/dL — ABNORMAL HIGH (ref 65–99)

## 2014-10-01 LAB — CBC WITH DIFFERENTIAL/PLATELET
Basophils Absolute: 0 10*3/uL (ref 0.0–0.1)
Basophils Relative: 0 % (ref 0–1)
EOS ABS: 0.1 10*3/uL (ref 0.0–0.7)
Eosinophils Relative: 2 % (ref 0–5)
HCT: 26.5 % — ABNORMAL LOW (ref 36.0–46.0)
Hemoglobin: 8.1 g/dL — ABNORMAL LOW (ref 12.0–15.0)
LYMPHS PCT: 28 % (ref 12–46)
Lymphs Abs: 1.4 10*3/uL (ref 0.7–4.0)
MCH: 23.8 pg — ABNORMAL LOW (ref 26.0–34.0)
MCHC: 30.6 g/dL (ref 30.0–36.0)
MCV: 77.9 fL — ABNORMAL LOW (ref 78.0–100.0)
MONO ABS: 0.4 10*3/uL (ref 0.1–1.0)
Monocytes Relative: 7 % (ref 3–12)
Neutro Abs: 3.1 10*3/uL (ref 1.7–7.7)
Neutrophils Relative %: 62 % (ref 43–77)
Platelets: 88 10*3/uL — ABNORMAL LOW (ref 150–400)
RBC: 3.4 MIL/uL — AB (ref 3.87–5.11)
RDW: 14.7 % (ref 11.5–15.5)
WBC: 5 10*3/uL (ref 4.0–10.5)

## 2014-10-01 LAB — COMPREHENSIVE METABOLIC PANEL
ALT: 25 U/L (ref 14–54)
ANION GAP: 10 (ref 5–15)
AST: 30 U/L (ref 15–41)
Albumin: 3 g/dL — ABNORMAL LOW (ref 3.5–5.0)
Alkaline Phosphatase: 99 U/L (ref 38–126)
BUN: 43 mg/dL — AB (ref 6–20)
CALCIUM: 9.1 mg/dL (ref 8.9–10.3)
CHLORIDE: 98 mmol/L — AB (ref 101–111)
CO2: 29 mmol/L (ref 22–32)
CREATININE: 1.27 mg/dL — AB (ref 0.44–1.00)
GFR calc non Af Amer: 42 mL/min — ABNORMAL LOW (ref >60)
GFR, EST AFRICAN AMERICAN: 48 mL/min — AB (ref >60)
GLUCOSE: 117 mg/dL — AB (ref 65–99)
Potassium: 4.4 mmol/L (ref 3.5–5.1)
Sodium: 137 mmol/L (ref 135–145)
TOTAL PROTEIN: 6.2 g/dL — AB (ref 6.5–8.1)
Total Bilirubin: 0.6 mg/dL (ref 0.3–1.2)

## 2014-10-01 LAB — PROTIME-INR
INR: 1.07 (ref 0.00–1.49)
Prothrombin Time: 14.1 seconds (ref 11.6–15.2)

## 2014-10-01 MED ORDER — BISOPROLOL FUMARATE 5 MG PO TABS
5.0000 mg | ORAL_TABLET | Freq: Every day | ORAL | Status: DC
  Administered 2014-10-01 – 2014-10-02 (×2): 5 mg via ORAL
  Filled 2014-10-01 (×2): qty 1

## 2014-10-01 MED ORDER — ALBUTEROL SULFATE (2.5 MG/3ML) 0.083% IN NEBU
2.5000 mg | INHALATION_SOLUTION | Freq: Three times a day (TID) | RESPIRATORY_TRACT | Status: DC
  Administered 2014-10-02 (×2): 2.5 mg via RESPIRATORY_TRACT
  Filled 2014-10-01 (×2): qty 3

## 2014-10-01 NOTE — Progress Notes (Signed)
OT Cancellation Note  Patient Details Name: Brooke Mccullough MRN: [id number] DOB: 1946-02-22   Cancelled Treatment:    Reason Eval/Treat Not Completed: Patient at procedure or test/ unavailable. Pt currently in radiology.  Almon Register [id number] 10/01/2014, 2:10 PM

## 2014-10-01 NOTE — Progress Notes (Signed)
Inpatient Diabetes Program Recommendations  AACE/ADA: New Consensus Statement on Inpatient Glycemic Control (2013)  Target Ranges:  Prepandial:   less than 140 mg/dL      Peak postprandial:   less than 180 mg/dL (1-2 hours)      Critically ill patients:  140 - 180 mg/dL   Inpatient Diabetes Program Recommendations Insulin - Meal Coverage: add Novolog 3 units TID with meals per Glycemic Control order set  Thank you  Raoul Pitch BSN, RN,CDE Inpatient Diabetes Coordinator [phone] (team pager)

## 2014-10-01 NOTE — Progress Notes (Signed)
VASCULAR LAB PRELIMINARY  PRELIMINARY  PRELIMINARY  PRELIMINARY  Bilateral lower extremity venous duplex  completed.    Preliminary report:  Bilateral:  No evidence of DVT, superficial thrombosis, or Baker's Cyst.    Lyndy Russman, RVT 10/01/2014, 2:47 PM

## 2014-10-01 NOTE — Progress Notes (Signed)
PULMONARY / CRITICAL CARE MEDICINE   Name: Brooke Mccullough MRN: [id number] DOB: Mar 02, 1946    ADMISSION DATE:  09/29/2014 CONSULTATION DATE:  09/30/2014   REFERRING MD :  Dr [name]   CHIEF COMPLAINT:  Copd - chronic resp failure - consult   HISTORY OF PRESENT ILLNESS:   69 year old obese/overweight female. Brooke Mccullough bird relocated from [place] a year ago (son lives in [place]) [place] year. Carries diagnosis of COPD NOS (used to a Dr Idolina Primer or Damita Dunnings). Been on o2 for several years but says self weaned it off based on subjective assessment around 2 years ago but apparently on moving to [facility] PCP MCGOWEN,PHILIP H, MD assessed her to need 24/7 02 and she is on 2L. She has been fairly less mobile after all and RT ankle fracture with CIR Stay through feb 2016 (She uses walker). In addtion has chronic pain, fibromylagia,. TIA hx, diag chf, chronic pedal; edema etc., . Admitted 09/29/2014 for weakness, tremors and difficulty standing. Found to have WBC 13K and mild AKI with creat 1.58mg % (baseline 0.9mg %), normal trop. CXR Rt sided infiltrate v atelectasis per radiology (but per PCCM MD this is all right diaph paralyuss). PCCM consulted for copd and chronic resp failure consultation. Her PCP has intended for her to see a pulmonary doc. Patient currently endorses no acute worsening.       SUBJECTIVE:  Breathing better  VITAL SIGNS: Temp:  [97.4 F (36.3 C)-98.5 F (36.9 C)] 97.4 F (36.3 C) (06/10 0505) Pulse Rate:  [68-106] 68 (06/10 0505) Resp:  [16-22] 16 (06/10 0505) BP: (128-179)/(42-71) 133/42 mmHg (06/10 0505) SpO2:  [94 %-98 %] 98 % (06/10 0924) Weight:  [223 lb (101.152 kg)] 223 lb (101.152 kg) (06/09 1000) HEMODYNAMICS:   VENTILATOR SETTINGS:   INTAKE / OUTPUT:  Intake/Output Summary (Last 24 hours) at 10/01/14 0956 Last data filed at 10/01/14 0900  Gross per 24 hour  Intake 1074.17 ml  Output    400 ml  Net 674.17 ml    PHYSICAL EXAM Sitting in chair,. Looks better Neuro:   AxOX3. Speech normal.  HEENT:  Nasal cannula on.Neck supple Cardiovascular:  S1S2+. No murmurs Lungs:  CTA bilaterally , decreased in bases Abdomen:  Obese, soft, normal bowel sounds.  Musculoskeletal:  Rt side in ankle boot. Bialteral edema + Skin:  intact  LABS:  CBC  Recent Labs Lab 09/29/14 1155 09/30/14 0320 10/01/14 0342  WBC 13.3* 6.7 5.0  HGB 9.4* 8.3* 8.1*  HCT 29.4* 26.3* 26.5*  PLT 93* 91* 88*   Coag's  Recent Labs Lab 09/30/14 0957 10/01/14 0342  INR 1.16 1.07   BMET  Recent Labs Lab 09/29/14 1155 09/30/14 0320 10/01/14 0342  NA 135 136 137  K 4.9 4.4 4.4  CL 94* 95* 98*  CO2 32 31 29  BUN 62* 55* 43*  CREATININE 1.58* 1.28* 1.27*  GLUCOSE 106* 91 117*   Electrolytes  Recent Labs Lab 09/29/14 1155 09/30/14 0320 10/01/14 0342  CALCIUM 9.2 8.8* 9.1   Sepsis Markers No results for input(s): LATICACIDVEN, PROCALCITON, O2SATVEN in the last 168 hours. ABG  Recent Labs Lab 09/30/14 1810  PHART 7.391  PCO2ART 50.4*  PO2ART 76.0*   Liver Enzymes  Recent Labs Lab 09/30/14 0957 10/01/14 0342  AST 19 30  ALT 16 25  ALKPHOS 92 99  BILITOT 0.6 0.6  ALBUMIN 3.0* 3.0*   Cardiac Enzymes  Recent Labs Lab 09/29/14 1155  TROPONINI <0.03   Glucose  Recent Labs Lab 09/29/14 2137  09/30/14 0807 09/30/14 1210 09/30/14 1729 09/30/14 2059 10/01/14 0732  GLUCAP 143* 88 147* 254* 176* 117*   ABG    Component Value Date/Time   PHART 7.391 09/30/2014 1810   PCO2ART 50.4* 09/30/2014 1810   PO2ART 76.0* 09/30/2014 1810   HCO3 29.9* 09/30/2014 1810   TCO2 31.5 09/30/2014 1810   ACIDBASEDEF 2.0 10/07/2013 2148   O2SAT 94.9 09/30/2014 1810     Imaging No results found. CT chst 09/23/14 - personally visualized image - Significant Rt diaph elevation.  PE negative  ASSESSMENT / PLAN:  PULMONARY  A: Chronic resp failure - hypoxemic 2L in seting 30 ppd smoking, quit 1 year ago  - new problem for PCCM MD but stable for patient  -  likely copd based  on hx; unable to test due to her pain and ankel issues  - also has right diaphragm paralysis with ass atelectasiss -  - I do not think she has pneumonia based on hx and imaging  = high risk PE but ruled out 07/28/14  = at risk cor pulmonale   P:   o2 for pusle ox > 88% duoneb schedule Duplex LE - rule out edema due to her edema(pending) ABG consistent chronic co2 retention therefore avoid nocturnal sedation Cannot do anything more curerntly due to bedridden state  - such as PFT to assses severity  - might be worth repeating echo             -Pulmonary OPT follow up We will not see this weekend   [place] Pager [phone] till 3 pm If no answer page [phone] 10/01/2014, 10:03 AM

## 2014-10-01 NOTE — Progress Notes (Signed)
Pt. ambulated in hall with NT on room air, once returned to use bedside toilet was 93% on room air, replaced humidified 2 lpm n/c per home regimen.

## 2014-10-01 NOTE — Progress Notes (Signed)
Brooke Mccullough NWG:[id number] DOB: 11-04-1945 DOA: 09/29/2014 PCP: Tammi Sou, MD  Brief narrative:  61 ? recent R ankle # and prolonged CIR stay till 05/2014, DM ty II on insulin, Htn, HLd, COPD Gold stage III-IV [end stage per PCP?] on night time O2, Diastolic HF foll by Dr. Stanford Breed Brooke Mccullough weight 208?]-last echo 04/22/14=EF 60-65% prior breast CA s/p Lumpectomy 2008, TMN goiter follwed by Dr. Renne Mccullough, Multiple TIA 2012/2014, Cardio-renal syndrome with baseline CKD III long-standing anemia-TCP Fibromyalgia  1993, Chronic pain followed by Dr. [name] admitted with inability to bear weight as well as overall weakness She has had overall Adult FTT picture per PCP notes documented and has been weaker and weaker since her ankle injury in 05/2014  Past medical history-As per Problem list Chart reviewed as below-   Consultants:  Pulmonary  Procedures:  none  Antibiotics:  none   Subjective   Feels fair. Constipated No nausea no vomiting tolerating diet   Objective    Interim History:   Telemetry: Sinus   Objective: Filed Vitals:   10/01/14 0924 10/01/14 1405 10/01/14 1509 10/01/14 1530  BP:  140/60    Pulse:  84    Temp:  98.4 F (36.9 C)    TempSrc:  Oral    Resp:  16    Height:      Weight:      SpO2: 98% 98% 98% 98%    Intake/Output Summary (Last 24 hours) at 10/01/14 1555 Last data filed at 10/01/14 1300  Gross per 24 hour  Intake 1469.17 ml  Output    400 ml  Net 1069.17 ml    Exam:  General: EOMI NCAT Morbid obesity, Body mass index is 38.26 kg/(m^2).  Cardiovascular: S1-S2 regular rate rhythm Respiratory: Clinically clear no rales no rhonchi Abdomen:  soft nontender obese nondistended Skin: evidences of chronic venous insufficiency with stasis dermatitis as well as grade 1 lower extremity edema Neuro: grossly intact, coarse tremor, however not with intention  Data Reviewed: Basic Metabolic Panel:  Recent Labs Lab 09/29/14 1155  09/30/14 0320 10/01/14 0342  NA 135 136 137  K 4.9 4.4 4.4  CL 94* 95* 98*  CO2 32 31 29  GLUCOSE 106* 91 117*  BUN 62* 55* 43*  CREATININE 1.58* 1.28* 1.27*  CALCIUM 9.2 8.8* 9.1   Liver Function Tests:  Recent Labs Lab 09/30/14 0957 10/01/14 0342  AST 19 30  ALT 16 25  ALKPHOS 92 99  BILITOT 0.6 0.6  PROT 6.2* 6.2*  ALBUMIN 3.0* 3.0*   No results for input(s): LIPASE, AMYLASE in the last 168 hours. No results for input(s): AMMONIA in the last 168 hours. CBC:  Recent Labs Lab 09/29/14 1155 09/30/14 0320 10/01/14 0342  WBC 13.3* 6.7 5.0  NEUTROABS  --   --  3.1  HGB 9.4* 8.3* 8.1*  HCT 29.4* 26.3* 26.5*  MCV 78.0 78.7 77.9*  PLT 93* 91* 88*   Cardiac Enzymes:  Recent Labs Lab 09/29/14 1155  TROPONINI <0.03   BNP: Invalid input(s): POCBNP CBG:  Recent Labs Lab 09/30/14 1210 09/30/14 1729 09/30/14 2059 10/01/14 0732 10/01/14 1231  GLUCAP 147* 254* 176* 117* 215*    No results found for this or any previous visit (from the past 240 hour(s)).   Studies:              All Imaging reviewed and is as per above notation   Scheduled Meds: . atorvastatin  40 mg Oral Daily  . citalopram  20  mg Oral Daily  . clarithromycin  500 mg Oral Daily  . clopidogrel  75 mg Oral Daily  . insulin aspart  0-5 Units Subcutaneous QHS  . insulin aspart  0-9 Units Subcutaneous TID WC  . insulin glargine  40 Units Subcutaneous BID  . ipratropium-albuterol  3 mL Nebulization Q6H  . letrozole  2.5 mg Oral Daily  . levothyroxine  100 mcg Oral QAC breakfast  . loratadine  10 mg Oral Daily  . magnesium oxide  1,000 mg Oral Daily  . mometasone-formoterol  2 puff Inhalation BID  . morphine  15 mg Oral q morning - 10a  . morphine  30 mg Oral QHS  . pantoprazole  40 mg Oral BID  . pregabalin  150 mg Oral BID  . rOPINIRole  2 mg Oral BID  . tiotropium  18 mcg Inhalation Daily   Continuous Infusions: . sodium chloride 10 mL/hr at 09/29/14 2126      Assessment/Plan:  1.  Multifactorial ATS grade 3 dyspnea-smoker X-37 years quit 3299, diastolic heart failure and probable restrictive component. See below 2. Stage 3 Gold COPD on nocturnal oxygen-appreciate pulmonology consulted. Defer PFTs at present time. Continue tiotropium 18 g daily, change albuterol to 5 mg every 4 when necessary wheeze, add Dulera 200 g 2 puffs twice a day.  3. Acute decompensated diastolic heart failure-last echocardiogram = 03/2014 = EF 60-65%, dry weight? 208, currently 223 pounds. Unfortunately has also cardiorenal syndrome making diuresis challenging.  We will saline lock.  Her Lasix 60 twice a day was  to 40 po bid by cardiology.  Add TED hose  4. Acute kidney injury-see above discussion. Baseline CK D stage III -will need to keep volumes even. gfr ? from 32 on admit-->41.   5. Toxic multinodular goiter-outpatient follow-up with endocrinology-apparently has had treatment? Currently on Synthroid 100 g daily. 6. Multiple TIAs 2012/2014-continue Plavix 75 daily. 7. Breast cancer status post lumpectomy 2008-continue letrozole 2.5 daily. 8. Hypertension-lisinopril 10 mg daily on hold because of #4 going forward may not benefit from this -bisoprolol 5 added 6/10 9. Hyperlipidemia-continue atorvastatin 40 mg daily for secondary prevention. 10. Right nonhealing wound to foot-see's [employer] for second opinion-status post surgery 05/2014-continue morphine MS Contin 15 mg every 12-has a pain management physician Dr. [name] who she will follow up with. No pain medications will be refilled on discharge from hospital. Continue Lyrica 150 twice a day. 11. Restless leg syndrome continue Requip 2 mg 1 tab twice a day. ThromboCytopenia acute/chronic-1.1% of cases, class effect of letrozole-follow-up as an outpatient. risk bleeding ?SCD's.  12. Adult failure to thrive-complex situation. Carries a diagnosis of fibromyalgia since 1993, multiple mood medications and  also documented history in chart of patient's gradual decline since surgery.    Code Status:  full Family Communication:  no family present at bedside Disposition Plan:  inpatient   [name], MD  [facility] Pager [phone] 10/01/2014, 3:55 PM    LOS: 2 days

## 2014-10-01 NOTE — Care Management Note (Signed)
Case Management Note  Patient Details  Name: Brooke Mccullough MRN: [id number] Date of Birth: 28-Jul-1945  Subjective/Objective:     Pt adm on 09/29/14 with ARF, weakness, and acute resp failure.  PTA, pt resides at home with spouse.                 Action/Plan: Will follow for dc needs as pt progresses  Expected Discharge Date:                  Expected Discharge Plan:  [place]  In-House Referral:     Discharge planning Services  CM Consult  Post Acute Care Choice:    Choice offered to:     DME Arranged:    DME Agency:     HH Arranged:    HH Agency:     Status of Service:  In process, will continue to follow  Medicare Important Message Given:    Date Medicare IM Given:    Medicare IM give by:    Date Additional Medicare IM Given:    Additional Medicare Important Message give by:     If discussed at [place] of Stay Meetings, dates discussed:    Additional Comments:  Reinaldo Raddle, RN, BSN  Trauma/Neuro ICU Case Manager [phone]

## 2014-10-02 LAB — BASIC METABOLIC PANEL
Anion gap: 9 (ref 5–15)
BUN: 35 mg/dL — ABNORMAL HIGH (ref 6–20)
CO2: 29 mmol/L (ref 22–32)
CREATININE: 1.05 mg/dL — AB (ref 0.44–1.00)
Calcium: 9.2 mg/dL (ref 8.9–10.3)
Chloride: 99 mmol/L — ABNORMAL LOW (ref 101–111)
GFR calc Af Amer: >60 mL/min (ref >60)
GFR, EST NON AFRICAN AMERICAN: 53 mL/min — AB (ref >60)
GLUCOSE: 141 mg/dL — AB (ref 65–99)
Potassium: 4.7 mmol/L (ref 3.5–5.1)
Sodium: 137 mmol/L (ref 135–145)

## 2014-10-02 LAB — GLUCOSE, CAPILLARY
GLUCOSE-CAPILLARY: 123 mg/dL — AB (ref 65–99)
Glucose-Capillary: 134 mg/dL — ABNORMAL HIGH (ref 65–99)

## 2014-10-02 MED ORDER — MOMETASONE FURO-FORMOTEROL FUM 200-5 MCG/ACT IN AERO: 2.0000 | INHALATION_SPRAY | Freq: Two times a day (BID) | RESPIRATORY_TRACT | Status: DC

## 2014-10-02 MED ORDER — BISOPROLOL FUMARATE 5 MG PO TABS: 5.0000 mg | ORAL_TABLET | Freq: Every day | ORAL | Status: DC

## 2014-10-02 MED ORDER — FUROSEMIDE 40 MG PO TABS: 40.0000 mg | ORAL_TABLET | Freq: Two times a day (BID) | ORAL | Status: DC

## 2014-10-02 NOTE — Progress Notes (Signed)
Brooke Mccullough to be D/C'd [facility] per MD order.  Discussed with the patient and all questions fully answered.  VSS, Skin clean, dry and intact without evidence of skin break down, no evidence of skin tears noted.  An After Visit Summary was printed and given to the patient. Patient received prescription.  D/c education completed with patient/family including follow up instructions, medication list, d/c activities limitations if indicated, with other d/c instructions as indicated by MD - patient able to verbalize understanding, all questions fully answered.   Patient instructed to return to ED, call 911, or call MD for any changes in condition.   [facility] to transport [facility] to [facility].  [name] 10/02/2014 1:28 PM

## 2014-10-02 NOTE — Discharge Summary (Signed)
Physician Discharge Summary  Brooke Mccullough GUR:[id number] DOB: [birth year] DOA: 09/29/2014  PCP: Tammi Sou, MD  Admit date: 09/29/2014 Discharge date: 10/02/2014  Time spent: 40 minutes  Recommendations for Outpatient Follow-up:  1.  to meds as below  Modify on Discharge  - furosemide (LASIX) 40 MG tablet Stop Taking on Discharge  - clarithromycin (BIAXIN) 500 MG tablet - lisinopril (PRINIVIL,ZESTRIL) 10 MG tablet Order on Discharge  - bisoprolol (ZEBETA) 5 MG tablet - mometasone-formoterol (DULERA) 200-5 MCG/ACT AERO  2. Highly recommned close follow up Geriatrician Dr. [name] as OP 3. Follow up c Ortho as prn 4. Follow up with pain Management as prn 5. Pulmonology should set-up follow up as OP 6. Needs bmet and cbc 1 week PCP office 7. recommned TSH when at steady state as HPA axis can be deranged with chronic pain-defer to Pain management to assit with this Dr. [name] 8. Needs OP Oncology surveillance-She is on Letrozole which can cause TCP and this might need to be adjusted 9. Resume HH with [facility] PT and [facility]  Discharge Diagnoses:  Principal Problem:   Acute renal failure Active Problems:   Fibromyalgia   Chronic pain syndrome   Chronic renal insufficiency, stage III (moderate)   Hypothyroidism   HTN (hypertension), benign   Closed right trimalleolar fracture   COPD (chronic obstructive pulmonary disease)   Chronic diastolic heart failure, NYHA class 1   Thrombocytopenia (chronic)   Diabetes type 2, controlled   Weakness   Chronic respiratory failure with hypoxia   Chronic respiratory failure   Discharge Condition: fair  Diet recommendation: heart healthy low salt  Filed Weights   09/30/14 1000 10/02/14 0523  Weight: 101.152 kg (223 lb) 102.4 kg (225 lb 12 oz)    History of present illness:  69 ? recent R ankle # and prolonged CIR stay till 05/2014, DM ty II on insulin, Htn, HLd, COPD Gold stage III-IV [end stage per PCP?] on night time O2, Diastolic HF  foll by Dr. Stanford Breed Brooke Mccullough weight 208?]-last echo 04/22/14=EF 60-65% prior breast CA s/p Lumpectomy 2008, TMN goiter follwed by Dr. [name], Multiple TIA 2012/2014, Cardio-renal syndrome with baseline CKD III long-standing anemia-TCP Fibromyalgia  1993, Chronic pain followed by Dr. [name] admitted with inability to bear weight as well as overall weakness She has had overall Adult FTT picture per PCP notes documented and has been weaker and weaker since her ankle injury in 05/2014  Hospital Course:    1. Multifactorial ATS grade 3 dyspnea-smoker X-37 years quit 2831, diastolic heart failure and probable restrictive component. See below 2. Stage 3 Gold COPD on nocturnal oxygen-appreciate pulmonology consulted. Defer PFTs at present time. Continue tiotropium 18 g daily, change albuterol to 5 mg every 4 when necessary wheeze, add Dulera 200 g 2 puffs twice a day.  3. Acute decompensated diastolic heart failure-last echocardiogram = 03/2014 = EF 60-65%, dry weight? 208, currently 223 pounds. Unfortunately has also cardiorenal syndrome making diuresis challenging. We will saline lock. Her Lasix 60 twice a day was  to 40 po bid by cardiology and she will continue this dose on d/c home. Add TED hose  4. Acute kidney injury-see above discussion. Baseline CK D stage III -will need to keep volumes even. Gfr ? from 32 on admit-->41.  5. Toxic multinodular goiter-outpatient follow-up with endocrinology-apparently has had treatment? Currently on Synthroid 100 g daily. 6. Multiple TIAs 2012/2014-continue Plavix 75 daily. 7. Breast cancer status post lumpectomy 2008-continue letrozole 2.5 daily. 8. Hypertension-lisinopril 10 mg daily on  hold because of #4 going forward may not benefit from this -bisoprolol 5 added 6/10 9. Hyperlipidemia-continue atorvastatin 40 mg daily for secondary prevention. 10. Right nonhealing wound to foot-see's [employer] for second opinion-status post surgery  05/2014-continue morphine MS Contin 15 mg every 12-has a pain management physician Dr. [name] who she will follow up with. No pain medications will be refilled on discharge from hospital. Continue Lyrica 150 twice a day. 11. Restless leg syndrome continue Requip 2 mg 1 tab twice a day. 18. Thrombocytopenia acute/chronic-1.1% of cases, class effect of letrozole-follow-up as an outpatient. risk bleeding ?SCD's.  12. Adult failure to thrive-complex situation. Carries a diagnosis of fibromyalgia since 1993, multiple mood medications and also documented history in chart of patient's gradual decline since surgery   Consultants: Pulmonary  Procedures: none  Antibiotics   Discharge Exam: Filed Vitals:   10/02/14 0523  BP: 165/68  Pulse: 65  Temp: 97.7 F (36.5 C)  Resp: 16    General: alert pleasant oriented Cardiovascular: s1 s 2no m/r/g Respiratory: clear  Discharge Instructions    Discharge Instructions    Diet - low sodium heart healthy    Complete by:  As directed      Discharge instructions    Complete by:  As directed   New meds have been added to your list and some others have been discontinued.   You will need close OP follow up with Lung MD and pain management It is a very good idea to follow up with Geriatrician Dr. [name] as she can help you simplify medications on your list Get some lab work done soon at Dr. Rulon Mccullough office     Increase activity slowly    Complete by:  As directed           Current Discharge Medication List    START taking these medications   Details  bisoprolol (ZEBETA) 5 MG tablet Take 1 tablet (5 mg total) by mouth daily. Qty: 30 tablet, Refills: 0    mometasone-formoterol (DULERA) 200-5 MCG/ACT AERO Inhale 2 puffs into the lungs 2 (two) times daily. Qty: 1 Inhaler, Refills: 0      CONTINUE these medications which have CHANGED   Details  furosemide (LASIX) 40 MG tablet Take 1 tablet (40 mg total) by mouth 2 (two) times daily. Qty:  60 tablet, Refills: 6      CONTINUE these medications which have NOT CHANGED   Details  albuterol (PROVENTIL HFA;VENTOLIN HFA) 108 (90 BASE) MCG/ACT inhaler Inhale 2 puffs into the lungs every 6 (six) hours as needed for wheezing or shortness of breath. Qty: 1 Inhaler, Refills: 2    albuterol (PROVENTIL) (2.5 MG/3ML) 0.083% nebulizer solution Take 3 mLs (2.5 mg total) by nebulization every 4 (four) hours as needed for wheezing or shortness of breath. Qty: 75 mL, Refills: 0    ALPRAZolam (XANAX) 0.5 MG tablet Take 1 tablet (0.5 mg total) by mouth 2 (two) times daily as needed for anxiety or sleep. Qty: 60 tablet, Refills: 5    atorvastatin (LIPITOR) 40 MG tablet TAKE 1 TABLET BY MOUTH DAILY Qty: 30 tablet, Refills: 3    cetirizine (ZYRTEC) 10 MG tablet Take 10 mg by mouth daily as needed for allergies.    cholecalciferol (VITAMIN D) 1000 UNITS tablet Take 1,000 Units by mouth daily.    citalopram (CELEXA) 20 MG tablet TAKE 1 TABLET BY MOUTH EVERY DAY Qty: 30 tablet, Refills: 6    clopidogrel (PLAVIX) 75 MG tablet Take 1  tablet (75 mg total) by mouth daily. Qty: 90 tablet, Refills: 3    insulin aspart (NOVOLOG) 100 UNIT/ML injection 3 U SQ qAC Qty: 10 mL    Insulin Glargine (LANTUS) 100 UNIT/ML Solostar Pen Inject 40 Units into the skin 2 (two) times daily. Qty: 3 pen, Refills: 3    letrozole (FEMARA) 2.5 MG tablet TAKE 1 TABLET BY MOUTH EVERY DAY Qty: 90 tablet, Refills: 3    levothyroxine (SYNTHROID, LEVOTHROID) 100 MCG tablet Take 1 tablet (100 mcg total) by mouth daily before breakfast. Qty: 90 tablet, Refills: 1    Magnesium Oxide 500 MG (LAX) TABS Take 1,000 mg by mouth daily.     meclizine (ANTIVERT) 12.5 MG tablet TAKE 1 TO 2 TABLETS BY MOUTH EVERY 6 HOURS AS NEEDED FOR NAUSEA OR DIZZINESS Qty: 60 tablet, Refills: 3    morphine (MS CONTIN) 15 MG 12 hr tablet 1 tab po qAM and 2 tabs po qhs Qty: 90 tablet, Refills: 0    Multiple Vitamins-Minerals (MULTIVITAMIN PO)  Take 1 tablet by mouth daily.    omeprazole (PRILOSEC) 40 MG capsule 1 tab po bid Qty: 60 capsule, Refills: 3   Associated Diagnoses: Gastroesophageal reflux disease, esophagitis presence not specified    oxyCODONE-acetaminophen (PERCOCET/ROXICET) 5-325 MG per tablet Take 1 tablet by mouth every 6 (six) hours as needed for severe pain.    pregabalin (LYRICA) 150 MG capsule Take 1 capsule (150 mg total) by mouth 2 (two) times daily. Qty: 60 capsule, Refills: 5    rOPINIRole (REQUIP) 2 MG tablet 1 tab po bid Qty: 60 tablet, Refills: 6    SPIRIVA HANDIHALER 18 MCG inhalation capsule INHALE CONTENTS OF 1 CAPSULE USING HANDIHALER EVERY DAY Qty: 90 capsule, Refills: 0    polyethylene glycol (MIRALAX / GLYCOLAX) packet Take 17 g by mouth daily. Qty: 14 each, Refills: 0      STOP taking these medications     clarithromycin (BIAXIN) 500 MG tablet      lisinopril (PRINIVIL,ZESTRIL) 10 MG tablet        Allergies  Allergen Reactions  . Ciprofloxacin Other (See Comments)    Unspecified--noted in old PCP's records.  . Clindamycin/Lincomycin Rash  . Fentanyl Rash    Duragesic Patch  . Indocin [Indomethacin] Rash  . Keflex [Cephalexin] Rash  . Latex Other (See Comments)    Unspecified: noted in old PCP's records.  . Penicillins Rash  . Vancomycin Rash      The results of significant diagnostics from this hospitalization (including imaging, microbiology, ancillary and laboratory) are listed below for reference.    Significant Diagnostic Studies: Ct Head Wo Contrast  09/29/2014   CLINICAL DATA:  Weakness and tremors.  EXAM: CT HEAD WITHOUT CONTRAST  TECHNIQUE: Contiguous axial images were obtained from the base of the skull through the vertex without intravenous contrast.  COMPARISON:  10/07/2013  FINDINGS: Stable mild atrophy and small vessel disease. Stable probable old lacunar infarct in the inferior aspect of the left basal ganglia. The brain demonstrates no evidence of hemorrhage,  acute infarction, edema, mass effect, extra-axial fluid collection, hydrocephalus or mass lesion. The skull is unremarkable. Stable mucous retention cyst in the right maxillary antrum.  IMPRESSION: No acute abnormalities identified. Stable mild atrophy, small vessel disease and right basal ganglia lacunar infarct.   Electronically Signed   By: Aletta Edouard M.D.   On: 09/29/2014 12:29   Ct Angio Chest Pe W/cm &/or Wo Cm  09/23/2014   CLINICAL DATA:  Shortness of breath.  EXAM: CT ANGIOGRAPHY CHEST WITH CONTRAST  TECHNIQUE: Multidetector CT imaging of the chest was performed using the standard protocol during bolus administration of intravenous contrast. Multiplanar CT image reconstructions and MIPs were obtained to evaluate the vascular anatomy.  CONTRAST:  116mL OMNIPAQUE IOHEXOL 350 MG/ML SOLN  COMPARISON:  CT scan of April 17, 2014.  FINDINGS: No pneumothorax or pleural effusion is noted. Mild airspace opacity with air bronchograms is noted inferiorly in right middle lobe most consistent with subsegmental atelectasis. Ill-defined densities are noted in right upper lobe concerning for possible early pneumonia or inflammation. No pulmonary embolus is noted. Elevated right hemidiaphragm is again noted and unchanged. Residual mediastinal adenopathy is noted which is decreased in size compared to prior exam, most consistent with reactive or improving inflammatory adenopathy. There is no evidence thoracic aortic dissection or aneurysm. Atherosclerosis of thoracic aorta is noted.  Stable 2.5 x 2.4 cm solid nodule seen posterior to right thyroid lobe. Stable right adrenal adenoma is noted compared to prior exam. No significant osseous abnormality is noted.  Review of the MIP images confirms the above findings.  IMPRESSION: No evidence of pulmonary embolus.  Elevated right hemidiaphragm is noted with probable subsegmental atelectasis seen in the right middle lobe.  Ill-defined small densities are noted in right  upper lobe concerning for possible pneumonia or inflammation.  Stable right adrenal adenoma.  2.5 cm solid density is seen posterior to right thyroid lobe concerning for thyroid nodule. Thyroid ultrasound is recommended for further evaluation.   Electronically Signed   By: Marijo Conception, M.D.   On: 09/23/2014 16:02   Dg Chest Port 1 View  09/29/2014   CLINICAL DATA:  Cough.  Weakness.  Tremors.  COPD.  EXAM: PORTABLE CHEST - 1 VIEW  COMPARISON:  08/02/2014 and 04/20/2014  FINDINGS: Mild cardiomegaly remains stable. No evidence of congestive heart failure. Low lung volumes again noted as well as chronic elevation of right hemidiaphragm. Increased linear opacity is noted in the lateral right lung base, consistent with subsegmental atelectasis. No evidence of pulmonary consolidation.  IMPRESSION: Increased right basilar subsegmental atelectasis.  Stable cardiomegaly and chronic elevation of right hemidiaphragm.   Electronically Signed   By: [name] M.D.   On: 09/29/2014 11:35    Microbiology: No results found for this or any previous visit (from the past 240 hour(s)).   Labs: Basic Metabolic Panel:  Recent Labs Lab 09/29/14 1155 09/30/14 0320 10/01/14 0342 10/02/14 0335  NA 135 136 137 137  K 4.9 4.4 4.4 4.7  CL 94* 95* 98* 99*  CO2 32 31 29 29   GLUCOSE 106* 91 117* 141*  BUN 62* 55* 43* 35*  CREATININE 1.58* 1.28* 1.27* 1.05*  CALCIUM 9.2 8.8* 9.1 9.2   Liver Function Tests:  Recent Labs Lab 09/30/14 0957 10/01/14 0342  AST 19 30  ALT 16 25  ALKPHOS 92 99  BILITOT 0.6 0.6  PROT 6.2* 6.2*  ALBUMIN 3.0* 3.0*   No results for input(s): LIPASE, AMYLASE in the last 168 hours. No results for input(s): AMMONIA in the last 168 hours. CBC:  Recent Labs Lab 09/29/14 1155 09/30/14 0320 10/01/14 0342  WBC 13.3* 6.7 5.0  NEUTROABS  --   --  3.1  HGB 9.4* 8.3* 8.1*  HCT 29.4* 26.3* 26.5*  MCV 78.0 78.7 77.9*  PLT 93* 91* 88*   Cardiac Enzymes:  Recent Labs Lab  09/29/14 1155  TROPONINI <0.03   BNP: BNP (last 3 results)  Recent Labs  06/10/14  1907 08/02/14 1647 09/29/14 1155  BNP 69.6 56.8 48.0    ProBNP (last 3 results)  Recent Labs  10/07/13 1908  PROBNP 919.7*    CBG:  Recent Labs Lab 10/01/14 0732 10/01/14 1231 10/01/14 1733 10/01/14 2134 10/02/14 0717  GLUCAP 117* 215* 173* 195* 123*       Signed:  Nita Sells  [facility] 10/02/2014, 11:32 AM

## 2014-10-06 ENCOUNTER — Telehealth: Payer: Self-pay | Admitting: *Deleted

## 2014-10-06 NOTE — Telephone Encounter (Signed)
Brooke Mccullough with [facility] called stating that pt was recently seen in ER and they changed some of her medications and she can't remember which medications she is to be taking. Per discharge summary from ER they d/c the lisinopril and started her on bisoprolol. [name] at [facility] advised and voiced understanding. This was reviewed and okayed by Dr. Anitra Lauth.

## 2014-10-09 ENCOUNTER — Other Ambulatory Visit: Payer: Self-pay | Admitting: Family Medicine

## 2014-10-11 NOTE — Telephone Encounter (Signed)
LOV: 08/20/14 NOV: 10/27/14 Last filled: 06/24/14 #90 w/ 0RF.

## 2014-10-16 DIAGNOSIS — N183 Chronic kidney disease, stage 3 (moderate): Secondary | ICD-10-CM | POA: Diagnosis not present

## 2014-10-16 DIAGNOSIS — I129 Hypertensive chronic kidney disease with stage 1 through stage 4 chronic kidney disease, or unspecified chronic kidney disease: Secondary | ICD-10-CM

## 2014-10-16 DIAGNOSIS — S82851D Displaced trimalleolar fracture of right lower leg, subsequent encounter for closed fracture with routine healing: Secondary | ICD-10-CM | POA: Diagnosis not present

## 2014-10-16 DIAGNOSIS — E1122 Type 2 diabetes mellitus with diabetic chronic kidney disease: Secondary | ICD-10-CM | POA: Diagnosis not present

## 2014-10-18 ENCOUNTER — Other Ambulatory Visit: Payer: Self-pay | Admitting: Family Medicine

## 2014-10-27 ENCOUNTER — Inpatient Hospital Stay: Payer: Medicare Other | Admitting: Adult Health

## 2014-10-27 ENCOUNTER — Ambulatory Visit (HOSPITAL_BASED_OUTPATIENT_CLINIC_OR_DEPARTMENT_OTHER)
Admission: RE | Admit: 2014-10-27 | Discharge: 2014-10-27 | Disposition: A | Discharge status: Home / Self Care | Payer: Medicare Other | Source: Ambulatory Visit | Attending: Family Medicine | Admitting: Family Medicine

## 2014-10-27 ENCOUNTER — Ambulatory Visit (INDEPENDENT_AMBULATORY_CARE_PROVIDER_SITE_OTHER): Payer: Medicare Other | Admitting: Family Medicine

## 2014-10-27 ENCOUNTER — Encounter: Payer: Self-pay | Admitting: Family Medicine

## 2014-10-27 VITALS — BP 116/64 | HR 71 | Temp 98.1°F | Resp 16

## 2014-10-27 DIAGNOSIS — G894 Chronic pain syndrome: Secondary | ICD-10-CM | POA: Diagnosis not present

## 2014-10-27 DIAGNOSIS — R0602 Shortness of breath: Secondary | ICD-10-CM

## 2014-10-27 DIAGNOSIS — N183 Chronic kidney disease, stage 3 unspecified: Secondary | ICD-10-CM

## 2014-10-27 DIAGNOSIS — D509 Iron deficiency anemia, unspecified: Secondary | ICD-10-CM

## 2014-10-27 DIAGNOSIS — N189 Chronic kidney disease, unspecified: Secondary | ICD-10-CM | POA: Diagnosis not present

## 2014-10-27 DIAGNOSIS — Q791 Other congenital malformations of diaphragm: Secondary | ICD-10-CM | POA: Insufficient documentation

## 2014-10-27 DIAGNOSIS — J438 Other emphysema: Secondary | ICD-10-CM

## 2014-10-27 DIAGNOSIS — J9611 Chronic respiratory failure with hypoxia: Secondary | ICD-10-CM

## 2014-10-27 DIAGNOSIS — J449 Chronic obstructive pulmonary disease, unspecified: Secondary | ICD-10-CM | POA: Diagnosis not present

## 2014-10-27 DIAGNOSIS — I517 Cardiomegaly: Secondary | ICD-10-CM | POA: Insufficient documentation

## 2014-10-27 DIAGNOSIS — E118 Type 2 diabetes mellitus with unspecified complications: Secondary | ICD-10-CM

## 2014-10-27 DIAGNOSIS — I5032 Chronic diastolic (congestive) heart failure: Secondary | ICD-10-CM

## 2014-10-27 DIAGNOSIS — N2889 Other specified disorders of kidney and ureter: Secondary | ICD-10-CM

## 2014-10-27 DIAGNOSIS — E662 Morbid (severe) obesity with alveolar hypoventilation: Secondary | ICD-10-CM

## 2014-10-27 NOTE — Progress Notes (Signed)
Pre visit review using our clinic review tool, if applicable. No additional management support is needed unless otherwise documented below in the visit note. 

## 2014-10-27 NOTE — Progress Notes (Signed)
OFFICE VISIT  11/03/2014   CC:  Chief Complaint  Patient presents with  . Follow-up   HPI:    Patient is a 69 y.o. Caucasian female who presents for 2 mo f/u. Last 2 d, worsening SOB without CP or cough.  No nausea or palpitations or fevers.  Oxygen dropping intermittently to 70s-80s, then comes back up randomly.  Eating less, drinking same or more.  Swelling in lower legs.  She doesn't feel like she has abdominal swelling. She sees cardiologist next week.  She has seen the geriatrician once.    Ortho second opinion at [facility] has occurred: ankle is healed and the MD did not know why she was still in such pain. She is to continue to wear the walking cast, walk as much as possible.    Past Medical History  Diagnosis Date  . COPD (chronic obstructive pulmonary disease)     nocturnal oxygen, plus daytime use with activity  . IBS (irritable bowel syndrome)   . Edema   . Gallstones     asymptomatic per pt (as of 09/30/2013); mild chronic elevation of Alk phos (remainder of hepatic panel wnl).  . Hernia of abdominal wall   . Hypertension   . Hyperlipidemia   . On home oxygen therapy     "3.4 depending on how I feel; 24/7" (09/29/2014)  . Adrenal benign tumor   . Anemia 2015    Baseline Hb 11, normocytic.  Marland Kitchen GERD (gastroesophageal reflux disease)   . History of duodenal ulcer   . TIA (transient ischemic attack) 2012; 2014    left facial and left arm numbness since  . Chronic lower back pain     "L5-S1" (09/29/2014)  . Gout   . Anxiety   . Depression   . Tremor     "upper extremities; last 6-7 months" (09/28/2013)  . Multinodular goiter summer 2015    FNA 97/6/73 "Follicular lesion of undetermined significance": a follicular lesion/neoplasm can not be ruled out--Dr. Cruzita Lederer recommended repeat bx in 6-12 mo  . Chronic diastolic CHF (congestive heart failure) 2015  . Pneumonia "once"  . Closed right trimalleolar fracture     Surgery/fixation hardware placed.  Marland Kitchen Unspecified  hypothyroidism 12/21/2013  . Type II diabetes mellitus     hx of good control per pt report + HbA1c <7% June 2015.  Marland Kitchen History of hiatal hernia   . Migraine     "@ least monthly" (09/29/2014)  . Degenerative disc disease     Cervical and lumbar  . Osteoarthritis (arthritis due to wear and tear of joints)     "back; knees; elbows; left ankle" (09/29/2014)  . Fibromyalgia     Dx'd 1993.  This has impaired her significantly.  . Cancer of right breast 2008    Lumpectomy + radiation+aromatase inhibitor (femara).  Regular f/u and mammos normal.    Past Surgical History  Procedure Laterality Date  . Lumbar laminectomy  1991; 1996  . Orif ankle fracture bimalleolar Left 08/2011  . Breast biopsy Right 2008  . Breast lumpectomy Right 2008  . Cataract extraction w/ intraocular lens implant Right 08/2013  . Cardiac catheterization  2000's X 2  . Combined hysterectomy vaginal / oophorectomy / a&p repair / sacrospinous ligament suspension  03/1989  . Orif ankle fracture Right 02/11/2014    Procedure: OPEN REDUCTION INTERNAL FIXATION (ORIF) ANKLE FRACTURE;  Surgeon: Alta Corning, MD;  Location: [place];  Service: Orthopedics;  Laterality: Right;  . Transthoracic echocardiogram  03/2014  Mild LVH, EF 60-65%, grade I diast dysfxn.  Mild MR.  . Holter monitor  08/2014    NORMAL  . Back surgery    . Fracture surgery    . Vaginal hysterectomy    . Biopsy thyroid  05/2014    "sample wasn't good; will wait 6 months and repeat biopsy"    Outpatient Prescriptions Prior to Visit  Medication Sig Dispense Refill  . albuterol (PROVENTIL HFA;VENTOLIN HFA) 108 (90 BASE) MCG/ACT inhaler Inhale 2 puffs into the lungs every 6 (six) hours as needed for wheezing or shortness of breath. 1 Inhaler 2  . albuterol (PROVENTIL) (2.5 MG/3ML) 0.083% nebulizer solution Take 3 mLs (2.5 mg total) by nebulization every 4 (four) hours as needed for wheezing or shortness of breath. 75 mL 0  . ALPRAZolam (XANAX) 0.5 MG tablet Take 1  tablet (0.5 mg total) by mouth 2 (two) times daily as needed for anxiety or sleep. (Patient taking differently: Take 0.5 mg by mouth at bedtime as needed for sleep. ) 60 tablet 5  . atorvastatin (LIPITOR) 40 MG tablet TAKE 1 TABLET BY MOUTH DAILY 30 tablet 3  . bisoprolol (ZEBETA) 5 MG tablet Take 1 tablet (5 mg total) by mouth daily. 30 tablet 0  . cetirizine (ZYRTEC) 10 MG tablet Take 10 mg by mouth daily as needed for allergies.    . cholecalciferol (VITAMIN D) 1000 UNITS tablet Take 1,000 Units by mouth daily.    . citalopram (CELEXA) 20 MG tablet TAKE 1 TABLET BY MOUTH EVERY DAY 30 tablet 6  . clopidogrel (PLAVIX) 75 MG tablet Take 1 tablet (75 mg total) by mouth daily. 90 tablet 3  . furosemide (LASIX) 40 MG tablet Take 1 tablet (40 mg total) by mouth 2 (two) times daily. 60 tablet 6  . insulin aspart (NOVOLOG) 100 UNIT/ML injection 3 U SQ qAC (Patient taking differently: Inject 3 Units into the skin 3 (three) times daily with meals. ) 10 mL   . Insulin Glargine (LANTUS) 100 UNIT/ML Solostar Pen Inject 40 Units into the skin 2 (two) times daily. 3 pen 3  . letrozole (FEMARA) 2.5 MG tablet TAKE 1 TABLET BY MOUTH EVERY DAY 90 tablet 3  . levothyroxine (SYNTHROID, LEVOTHROID) 100 MCG tablet Take 1 tablet (100 mcg total) by mouth daily before breakfast. 90 tablet 1  . Magnesium Oxide 500 MG (LAX) TABS Take 1,000 mg by mouth daily.     . meclizine (ANTIVERT) 12.5 MG tablet TAKE 1 TO 2 TABLETS BY MOUTH EVERY 6 HOURS AS NEEDED FOR NAUSEA OR DIZZINESS 60 tablet 3  . mometasone-formoterol (DULERA) 200-5 MCG/ACT AERO Inhale 2 puffs into the lungs 2 (two) times daily. 1 Inhaler 0  . morphine (MS CONTIN) 15 MG 12 hr tablet 1 tab po qAM and 2 tabs po qhs 90 tablet 0  . Multiple Vitamins-Minerals (MULTIVITAMIN PO) Take 1 tablet by mouth daily.    Marland Kitchen omeprazole (PRILOSEC) 40 MG capsule 1 tab po bid (Patient taking differently: Take 40 mg by mouth 2 (two) times daily. ) 60 capsule 3  . pregabalin (LYRICA) 150  MG capsule Take 1 capsule (150 mg total) by mouth 2 (two) times daily. 60 capsule 5  . rOPINIRole (REQUIP) 2 MG tablet 1 tab po bid (Patient taking differently: Take 2 mg by mouth 2 (two) times daily. ) 60 tablet 6  . SPIRIVA HANDIHALER 18 MCG inhalation capsule INHALE CONTENTS OF 1 CAPSULE USINH HANDIHALER EVERY DAY 90 capsule 0  . oxyCODONE-acetaminophen (PERCOCET/ROXICET) 5-325  MG per tablet Take 1 tablet by mouth every 6 (six) hours as needed for severe pain.    . polyethylene glycol (MIRALAX / GLYCOLAX) packet Take 17 g by mouth daily. (Patient not taking: Reported on 10/27/2014) 14 each 0   No facility-administered medications prior to visit.    Allergies  Allergen Reactions  . Ciprofloxacin Other (See Comments)    Unspecified--noted in old PCP's records.  . Clindamycin/Lincomycin Rash  . Fentanyl Rash    Duragesic Patch  . Indocin [Indomethacin] Rash  . Keflex [Cephalexin] Rash  . Latex Other (See Comments)    Unspecified: noted in old PCP's records.  . Penicillins Rash  . Vancomycin Rash    ROS As per HPI  PE: Blood pressure 116/64, pulse 71, temperature 98.1 F (36.7 C), temperature source Oral, resp. rate 16, SpO2 98 %. O2 sat fluctuated between 83 and 95% on RA --improvements were seen when she took several deep breaths in a row. Gen: Alert, well appearing.  Patient is oriented to person, place, time, and situation. RCV:ELFY: no injection, icteris, swelling, or exudate.  EOMI, PERRLA. Mouth: lips without lesion/swelling.  Oral mucosa pink and moist. Oropharynx without erythema, exudate, or swelling.  CV: RRR, no m/r/g LUNGS: CTA bilat, nonlabored resps EXT: 2-3+ LE edema bilat  LABS:  Lab Results  Component Value Date   HGBA1C 6.6* 09/29/2014    Lab Results  Component Value Date   TSH 0.742 09/29/2014   Lab Results  Component Value Date   WBC 6.8 10/27/2014   HGB 9.1* 10/27/2014   HCT 28.1* 10/27/2014   MCV 77.3* 10/27/2014   PLT 115.0* 10/27/2014    Lab Results  Component Value Date   CREATININE 0.96 10/27/2014   BUN 30* 10/27/2014   NA 139 10/27/2014   K 4.8 10/27/2014   CL 97 10/27/2014   CO2 36* 10/27/2014   Lab Results  Component Value Date   ALT 25 10/01/2014   AST 30 10/01/2014   ALKPHOS 99 10/01/2014   BILITOT 0.6 10/01/2014   Lab Results  Component Value Date   CHOL 144 04/24/2014   Lab Results  Component Value Date   HDL 33* 04/24/2014   Lab Results  Component Value Date   LDLCALC 68 04/24/2014   Lab Results  Component Value Date   TRIG 213* 04/24/2014   Lab Results  Component Value Date   CHOLHDL 4.4 04/24/2014   IMPRESSION AND PLAN:  1) COPD, severe, oxygen-requiring.  Suspect obesity hypoventilation syndrome. Suspect cor pulmonale secondary to these lung problems.  She does not seem to be decompensating at this time. Pulm referral ordered today.  Pt encouraged to take [facility] as rx'd: 2 p bid NOT PRN, as she has been doing. Check CXR today.  2) Worsening anemia, microcytic: Hb 9.8 down to 8.1 from 4/11 to 10/01/2014.  Getting frequent blood draws. Recheck CBC today.  Check BMET and hemoccult cards x 3 as well.  3) HTN; The current medical regimen is effective;  continue present plan and medications.  4) Hx of closed R trimalleolar fracture, s/p surgery/fixation.  Healing has been documented by local ortho as well as second opinion ortho at [facility].  No reason can be found for excessive pain still present in ankle.  An After Visit Summary was printed and given to the patient.  FOLLOW UP: Return in about 6 weeks (around 12/08/2014) for routine chronic illness f/u.  I need to call Dr. [name], the geriatric specialist who has seen pt  once.

## 2014-10-28 LAB — BASIC METABOLIC PANEL
BUN: 30 mg/dL — AB (ref 6–23)
CHLORIDE: 97 meq/L (ref 96–112)
CO2: 36 mEq/L — ABNORMAL HIGH (ref 19–32)
Calcium: 9.1 mg/dL (ref 8.4–10.5)
Creatinine, Ser: 0.96 mg/dL (ref 0.40–1.20)
GFR: 61.01 mL/min (ref >60.00)
Glucose, Bld: 150 mg/dL — ABNORMAL HIGH (ref 70–99)
POTASSIUM: 4.8 meq/L (ref 3.5–5.1)
Sodium: 139 mEq/L (ref 135–145)

## 2014-10-28 LAB — CBC WITH DIFFERENTIAL/PLATELET
BASOS PCT: 0.3 % (ref 0.0–3.0)
Basophils Absolute: 0 10*3/uL (ref 0.0–0.1)
Eosinophils Absolute: 0.1 10*3/uL (ref 0.0–0.7)
Eosinophils Relative: 1.5 % (ref 0.0–5.0)
HCT: 28.1 % — ABNORMAL LOW (ref 36.0–46.0)
HEMOGLOBIN: 9.1 g/dL — AB (ref 12.0–15.0)
LYMPHS PCT: 19.7 % (ref 12.0–46.0)
Lymphs Abs: 1.3 10*3/uL (ref 0.7–4.0)
MCHC: 32.6 g/dL (ref 30.0–36.0)
MCV: 77.3 fl — ABNORMAL LOW (ref 78.0–100.0)
MONOS PCT: 4.7 % (ref 3.0–12.0)
Monocytes Absolute: 0.3 10*3/uL (ref 0.1–1.0)
NEUTROS ABS: 5 10*3/uL (ref 1.4–7.7)
Neutrophils Relative %: 73.8 % (ref 43.0–77.0)
Platelets: 115 10*3/uL — ABNORMAL LOW (ref 150.0–400.0)
RBC: 3.63 Mil/uL — AB (ref 3.87–5.11)
RDW: 16.6 % — ABNORMAL HIGH (ref 11.5–15.5)
WBC: 6.8 10*3/uL (ref 4.0–10.5)

## 2014-11-02 NOTE — Progress Notes (Signed)
HPI: FU diastolic CHF; Holter 5/15 showed sinus; echo 12/15 showed normal LV function, mild LVH, grade 1 diastolic dysfunction, mild MR, mild to moderate LAE. Lower ext dopplers 6/16 showed no DVT. CTA 6/16 showed no pulmonary embolus; thyroid nodule and ultrasound recommmended. Has microcytic anemia. Admitted recently and noted to have worsening renal insuff; lasix reduced; since DC, patient is now on continuous oxygen. She has limited mobility and walks using a walker. She has some dyspnea on exertion but denies dyspnea at rest. She has bilateral lower extremity edema. She denies chest pain.  Current Outpatient Prescriptions  Medication Sig Dispense Refill  . albuterol (PROVENTIL HFA;VENTOLIN HFA) 108 (90 BASE) MCG/ACT inhaler Inhale 2 puffs into the lungs every 6 (six) hours as needed for wheezing or shortness of breath. 1 Inhaler 2  . albuterol (PROVENTIL) (2.5 MG/3ML) 0.083% nebulizer solution Take 3 mLs (2.5 mg total) by nebulization every 4 (four) hours as needed for wheezing or shortness of breath. 75 mL 0  . ALPRAZolam (XANAX) 0.5 MG tablet Take 1 tablet (0.5 mg total) by mouth 2 (two) times daily as needed for anxiety or sleep. (Patient taking differently: Take 0.5 mg by mouth at bedtime as needed for sleep. ) 60 tablet 5  . atorvastatin (LIPITOR) 40 MG tablet TAKE 1 TABLET BY MOUTH DAILY 30 tablet 3  . bisoprolol (ZEBETA) 5 MG tablet Take 1 tablet (5 mg total) by mouth daily. 30 tablet 0  . cetirizine (ZYRTEC) 10 MG tablet Take 10 mg by mouth daily as needed for allergies.    . cholecalciferol (VITAMIN D) 1000 UNITS tablet Take 1,000 Units by mouth daily.    . citalopram (CELEXA) 20 MG tablet TAKE 1 TABLET BY MOUTH EVERY DAY 30 tablet 6  . clopidogrel (PLAVIX) 75 MG tablet Take 1 tablet (75 mg total) by mouth daily. 90 tablet 3  . furosemide (LASIX) 40 MG tablet Take 1 tablet (40 mg total) by mouth 2 (two) times daily. 60 tablet 6  . HYDROcodone-acetaminophen (NORCO) 7.5-325 MG per  tablet TAKE 1 TABLET PO Q 6 H  0  . insulin aspart (NOVOLOG) 100 UNIT/ML injection 3 U SQ qAC (Patient taking differently: Inject 3 Units into the skin 3 (three) times daily with meals. ) 10 mL   . Insulin Glargine (LANTUS) 100 UNIT/ML Solostar Pen Inject 40 Units into the skin 2 (two) times daily. 3 pen 3  . letrozole (FEMARA) 2.5 MG tablet TAKE 1 TABLET BY MOUTH EVERY DAY 90 tablet 3  . levothyroxine (SYNTHROID, LEVOTHROID) 100 MCG tablet Take 1 tablet (100 mcg total) by mouth daily before breakfast. 90 tablet 1  . Magnesium Oxide 500 MG (LAX) TABS Take 1,000 mg by mouth daily.     . meclizine (ANTIVERT) 12.5 MG tablet TAKE 1 TO 2 TABLETS BY MOUTH EVERY 6 HOURS AS NEEDED FOR NAUSEA OR DIZZINESS 60 tablet 3  . mometasone-formoterol (DULERA) 200-5 MCG/ACT AERO Inhale 2 puffs into the lungs 2 (two) times daily. 1 Inhaler 0  . morphine (MS CONTIN) 15 MG 12 hr tablet 1 tab po qAM and 2 tabs po qhs 90 tablet 0  . Multiple Vitamins-Minerals (MULTIVITAMIN PO) Take 1 tablet by mouth daily.    Marland Kitchen omeprazole (PRILOSEC) 40 MG capsule 1 tab po bid (Patient taking differently: Take 40 mg by mouth 2 (two) times daily. ) 60 capsule 3  . polyethylene glycol (MIRALAX / GLYCOLAX) packet Take 17 g by mouth daily. 14 each 0  . pregabalin (  LYRICA) 150 MG capsule Take 1 capsule (150 mg total) by mouth 2 (two) times daily. 60 capsule 5  . rOPINIRole (REQUIP) 2 MG tablet 1 tab po bid (Patient taking differently: Take 2 mg by mouth 2 (two) times daily. ) 60 tablet 6  . SPIRIVA HANDIHALER 18 MCG inhalation capsule INHALE CONTENTS OF 1 CAPSULE USINH HANDIHALER EVERY DAY 90 capsule 0   No current facility-administered medications for this visit.     Past Medical History  Diagnosis Date  . COPD (chronic obstructive pulmonary disease)     nocturnal oxygen, plus daytime use with activity  . IBS (irritable bowel syndrome)   . Edema   . Gallstones     asymptomatic per pt (as of 09/30/2013); mild chronic elevation of Alk  phos (remainder of hepatic panel wnl).  . Hernia of abdominal wall   . Hypertension   . Hyperlipidemia   . On home oxygen therapy     "3.4 depending on how I feel; 24/7" (09/29/2014)  . Adrenal benign tumor   . Anemia 2015    Baseline Hb 11, normocytic.  Marland Kitchen GERD (gastroesophageal reflux disease)   . History of duodenal ulcer   . TIA (transient ischemic attack) 2012; 2014    left facial and left arm numbness since  . Chronic lower back pain     "L5-S1" (09/29/2014)  . Gout   . Anxiety   . Depression   . Tremor     "upper extremities; last 6-7 months" (09/28/2013)  . Multinodular goiter summer 2015    FNA 03/30/77 "Follicular lesion of undetermined significance": a follicular lesion/neoplasm can not be ruled out--Dr. Cruzita Lederer recommended repeat bx in 6-12 mo  . Chronic diastolic CHF (congestive heart failure) 2015  . Pneumonia "once"  . Closed right trimalleolar fracture     Surgery/fixation hardware placed.  Marland Kitchen Unspecified hypothyroidism 12/21/2013  . Type II diabetes mellitus     hx of good control per pt report + HbA1c <7% June 2015.  Marland Kitchen History of hiatal hernia   . Migraine     "@ least monthly" (09/29/2014)  . Degenerative disc disease     Cervical and lumbar  . Osteoarthritis (arthritis due to wear and tear of joints)     "back; knees; elbows; left ankle" (09/29/2014)  . Fibromyalgia     Dx'd 1993.  This has impaired her significantly.  . Cancer of right breast 2008    Lumpectomy + radiation+aromatase inhibitor (femara).  Regular f/u and mammos normal.    Past Surgical History  Procedure Laterality Date  . Lumbar laminectomy  1991; 1996  . Orif ankle fracture bimalleolar Left 08/2011  . Breast biopsy Right 2008  . Breast lumpectomy Right 2008  . Cataract extraction w/ intraocular lens implant Right 08/2013  . Cardiac catheterization  2000's X 2  . Combined hysterectomy vaginal / oophorectomy / a&p repair / sacrospinous ligament suspension  03/1989  . Orif ankle fracture Right  02/11/2014    Procedure: OPEN REDUCTION INTERNAL FIXATION (ORIF) ANKLE FRACTURE;  Surgeon: Alta Corning, MD;  Location: [place];  Service: Orthopedics;  Laterality: Right;  . Transthoracic echocardiogram  03/2014    Mild LVH, EF 60-65%, grade I diast dysfxn.  Mild MR.  . Holter monitor  08/2014    NORMAL  . Back surgery    . Fracture surgery    . Vaginal hysterectomy    . Biopsy thyroid  05/2014    "sample wasn't good; will wait 6 months and  repeat biopsy"    History   Social History  . Marital Status: Married    Spouse Name: N/A  . Number of Children: N/A  . Years of Education: N/A   Occupational History  . Not on file.   Social History Main Topics  . Smoking status: Former Smoker -- 1.00 packs/day for 36 years    Types: Cigarettes    Quit date: 09/09/2013  . Smokeless tobacco: Never Used  . Alcohol Use: No  . Drug Use: No  . Sexual Activity: Not Currently   Other Topics Concern  . Not on file   Social History Narrative   Married, 2 children (one son and one daughter).   Relocated from [place] 08/2013 to be closer to children.   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   Educ: HS and 2 yrs business college.   Tobacco: 36 pack-yr hx, quit 2015.   Alcohol: none.  No drugs.   Ambulated with walker prior to right ankle trimalleolar fracture and subsequent surgery 01/2014.          ROS: no fevers or chills, productive cough, hemoptysis, dysphasia, odynophagia, melena, hematochezia, dysuria, hematuria, rash, seizure activity, orthopnea, PND, claudication. Remaining systems are negative.  Physical Exam: Well-developed well-nourished in no acute distress.  Skin is warm and dry.  HEENT is normal.  Neck is supple.  Chest with diminished breath sounds at the bases. Cardiovascular exam is regular rate and rhythm.  Abdominal exam nontender or distended. No masses palpated. Extremities show 1+ edema. neuro grossly intact

## 2014-11-03 ENCOUNTER — Ambulatory Visit (INDEPENDENT_AMBULATORY_CARE_PROVIDER_SITE_OTHER): Payer: Medicare Other | Admitting: Cardiology

## 2014-11-03 ENCOUNTER — Other Ambulatory Visit: Payer: Self-pay | Admitting: *Deleted

## 2014-11-03 ENCOUNTER — Encounter: Payer: Self-pay | Admitting: Cardiology

## 2014-11-03 VITALS — BP 143/57 | HR 61 | Ht 64.0 in | Wt 224.0 lb

## 2014-11-03 DIAGNOSIS — J438 Other emphysema: Secondary | ICD-10-CM

## 2014-11-03 DIAGNOSIS — R609 Edema, unspecified: Secondary | ICD-10-CM

## 2014-11-03 DIAGNOSIS — J9611 Chronic respiratory failure with hypoxia: Secondary | ICD-10-CM

## 2014-11-03 DIAGNOSIS — I10 Essential (primary) hypertension: Secondary | ICD-10-CM | POA: Diagnosis not present

## 2014-11-03 DIAGNOSIS — E662 Morbid (severe) obesity with alveolar hypoventilation: Secondary | ICD-10-CM

## 2014-11-03 DIAGNOSIS — I5032 Chronic diastolic (congestive) heart failure: Secondary | ICD-10-CM

## 2014-11-03 NOTE — Assessment & Plan Note (Signed)
Patient is scheduled to see pulmonary. She is on continuous home oxygen.

## 2014-11-03 NOTE — Assessment & Plan Note (Signed)
Probably multifactorial including venous insufficiency, right heart failure related to COPD and diastolic dysfunction. Plan to continue present dose of Lasix. She did have significant worsening renal insufficiency on 60 mg twice a day. I have asked her to continue leg elevation and compression hose. We discussed fluid restriction and sodium obstruction.

## 2014-11-03 NOTE — Assessment & Plan Note (Signed)
Continue present blood pressure medications. 

## 2014-11-03 NOTE — Patient Instructions (Signed)
Medication Instructions:  - no change  Labwork: - none   Testing/Procedures: - none  Follow-Up: Your physician wants you to follow-up in: 4 month with Dr. Stanford Breed. You will receive a reminder letter in the mail two months in advance. If you don't receive a letter, please call our office to schedule the follow-up appointment.   Any Other Special Instructions Will Be Listed Below (If Applicable).

## 2014-11-03 NOTE — Assessment & Plan Note (Signed)
Continue present dose of Lasix as outlined under peripheral edema.

## 2014-11-04 ENCOUNTER — Encounter: Payer: Self-pay | Admitting: Family Medicine

## 2014-11-05 ENCOUNTER — Other Ambulatory Visit: Payer: Self-pay | Admitting: *Deleted

## 2014-11-05 MED ORDER — BISOPROLOL FUMARATE 5 MG PO TABS: 5.0000 mg | ORAL_TABLET | Freq: Every day | ORAL | Status: DC

## 2014-11-05 NOTE — Telephone Encounter (Signed)
MyChart message from pt requesting refill for Zebeta. Okay to refill per Dr. Anitra Lauth. LOV: 10/27/14 NOV: 12/16/14 Last written: 10/02/14 #30 w/ 6YQ

## 2014-11-08 ENCOUNTER — Other Ambulatory Visit: Payer: Self-pay | Admitting: *Deleted

## 2014-11-08 MED ORDER — PREGABALIN 150 MG PO CAPS: 150.0000 mg | ORAL_CAPSULE | Freq: Two times a day (BID) | ORAL | Status: DC

## 2014-11-08 NOTE — Telephone Encounter (Signed)
RF request for Lyrica LOV: 10/27/14 Next ov: 12/16/14 Last written: 12/01/13 #60 w/ 5RF Please advise. Thanks.

## 2014-11-10 ENCOUNTER — Other Ambulatory Visit (INDEPENDENT_AMBULATORY_CARE_PROVIDER_SITE_OTHER): Payer: Medicare Other

## 2014-11-10 ENCOUNTER — Other Ambulatory Visit: Payer: Self-pay | Admitting: Family Medicine

## 2014-11-10 DIAGNOSIS — D649 Anemia, unspecified: Secondary | ICD-10-CM

## 2014-11-11 LAB — FECAL OCCULT BLOOD, IMMUNOCHEMICAL: FECAL OCCULT BLD: NEGATIVE

## 2014-11-12 ENCOUNTER — Encounter: Payer: Self-pay | Admitting: Internal Medicine

## 2014-11-12 ENCOUNTER — Ambulatory Visit (INDEPENDENT_AMBULATORY_CARE_PROVIDER_SITE_OTHER): Payer: Medicare Other | Admitting: Internal Medicine

## 2014-11-12 VITALS — BP 130/60 | HR 61 | Ht 64.0 in | Wt 223.0 lb

## 2014-11-12 DIAGNOSIS — J9612 Chronic respiratory failure with hypercapnia: Secondary | ICD-10-CM

## 2014-11-12 DIAGNOSIS — I10 Essential (primary) hypertension: Secondary | ICD-10-CM

## 2014-11-12 DIAGNOSIS — E669 Obesity, unspecified: Secondary | ICD-10-CM | POA: Diagnosis not present

## 2014-11-12 DIAGNOSIS — R0600 Dyspnea, unspecified: Secondary | ICD-10-CM | POA: Diagnosis not present

## 2014-11-12 NOTE — Progress Notes (Signed)
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  to meds as below Modify on Discharge  - furosemide (LASIX) 40 MG tablet Stop Taking on Discharge  - clarithromycin (BIAXIN) 500 MG tablet - lisinopril (PRINIVIL,ZESTRIL) 10 MG tablet Order on Discharge  - bisoprolol (ZEBETA) 5 MG tablet - mometasone-formoterol (DULERA) 200-5 MCG/ACT AERO  2. Highly recommned close follow up Geriatrician Dr. [name] as OP 3. Follow up c Ortho as prn 4. Follow up with pain Management as prn 5. Pulmonology should set-up follow up as OP 6. Needs bmet and cbc 1 week PCP office 7. recommned TSH when at steady state as HPA axis can be deranged with chronic pain-defer to Pain management to assit with this Dr. [name] 8. Needs OP Oncology surveillance-She is on Letrozole which can cause TCP and this might need to be adjusted 9. Resume HH with [facility] PT and [facility]  Discharge Diagnoses:  Principal Problem:  Acute renal failure Active Problems:  Fibromyalgia  Chronic pain syndrome  Chronic renal insufficiency, stage III (moderate)  Hypothyroidism  HTN (hypertension), benign  Closed right trimalleolar fracture  COPD (chronic obstructive pulmonary disease)  Chronic diastolic heart failure, NYHA class 1  Thrombocytopenia (chronic)  Diabetes type 2, controlled  Weakness  Chronic respiratory failure with hypoxia  Chronic respiratory failure   Discharge Condition:  fair  Diet recommendation: heart healthy low salt  Filed Weights   09/30/14 1000 10/02/14 0523  Weight: 101.152 kg (223 lb) 102.4 kg (225 lb 12 oz)    History of present illness:  69 ? recent R ankle # and prolonged CIR stay till 05/2014, DM ty II on insulin, Htn, HLd, COPD Gold stage III-IV [end stage per PCP?] on night time O2, Diastolic HF foll by Dr. Stanford Breed [name] weight 208?]-last echo 04/22/14=EF 60-65% prior breast CA s/p Lumpectomy 2008, TMN goiter follwed by Dr. [name], Multiple TIA 2012/2014, Cardio-renal syndrome with baseline CKD III long-standing anemia-TCP Fibromyalgia  1993, Chronic pain followed by Dr. [name] admitted with inability to bear weight as well as overall weakness She has had overall Adult FTT picture per PCP notes documented and has been weaker and weaker since her ankle injury in 05/2014  Hospital Course:    1. Multifactorial ATS grade 3 dyspnea-smoker X-37 years quit 3646, diastolic heart failure and probable restrictive component. See below 2. Stage 3 Gold COPD on nocturnal oxygen-appreciate pulmonology consulted. Defer PFTs at present time. Continue tiotropium 18 g daily, change albuterol to 5 mg every 4 when necessary wheeze, add Dulera 200 g 2 puffs twice a day.  3. Acute decompensated diastolic heart failure-last echocardiogram = 03/2014 = EF 60-65%, dry weight? 208, currently 223 pounds. Unfortunately has also cardiorenal syndrome making diuresis challenging. We will saline lock. Her Lasix 60 twice a day was  to 40 po bid by cardiology and she will continue this dose on d/c home. Add TED hose  4. Acute kidney injury-see above discussion.  Baseline CK D stage III -will need to keep volumes even. Gfr ? from 32 on admit-->41.  5. Toxic multinodular goiter-outpatient follow-up with endocrinology-apparently has had treatment? Currently on Synthroid 100 g daily. 6. Multiple TIAs 2012/2014-continue Plavix 75 daily. 7. Breast cancer status post  lumpectomy 2008-continue letrozole 2.5 daily. 8. Hypertension-lisinopril 10 mg daily on hold because of #4 going forward may not benefit from this -bisoprolol 5 added 6/10 9. Hyperlipidemia-continue atorvastatin 40 mg daily for secondary prevention. 10. Right nonhealing wound to foot-see's [employer] for second opinion-status post surgery 05/2014-continue morphine MS Contin 15 mg every 12-has a pain management physician Dr. [name] who she will follow up with. No pain medications will be refilled on discharge from hospital. Continue Lyrica 150 twice a day. 11. Restless leg syndrome continue Requip 2 mg 1 tab twice a day. 18. Thrombocytopenia acute/chronic-1.1% of cases, class effect of letrozole-follow-up as an outpatient. risk bleeding ?SCD's.  12. Adult failure to thrive-complex situation. Carries a diagnosis of fibromyalgia since 1993, multiple mood medications and also documented history in chart of patient's gradual decline since surgery        11/12/2014 1st [facility] office visit/ Brooke Mccullough   Chief Complaint  Patient presents with  . PULMONARY CONSULT    Referred by Dr Anitra Lauth. Recent cxr and CT chest.    When moved to [place] May 2015 was off 02 x 3 y and since then back on 02 x 3-4 liters 24/7  Each am dulera x 2 / spiriva dpi  More limited by wt and legs than breathing   No obvious day to day or daytime variability or assoc chronic cough or cp or chest tightness, subjective wheeze or overt sinus or hb symptoms. No unusual exp hx or h/o childhood pna/ asthma or knowledge of premature birth.  Sleeping ok without nocturnal  or early am exacerbation  of respiratory  c/o's or need for noct saba. Also denies any obvious fluctuation of symptoms with weather or environmental changes or other aggravating or alleviating factors except as outlined above   Current Medications, Allergies, Complete Past Medical History, Past Surgical History, Family History, and Social History were  reviewed in [employer] record.            Review of Systems  Constitutional: Positive for appetite change and unexpected weight change. Negative for fever.  HENT: Positive for congestion, dental problem and trouble swallowing. Negative for ear pain, nosebleeds, postnasal drip, rhinorrhea, sinus pressure, sneezing and sore throat.   Eyes: Negative for redness and itching.  Respiratory: Positive for shortness of breath. Negative for cough, chest tightness and wheezing.   Cardiovascular: Positive for leg swelling ( feet). Negative for palpitations.  Gastrointestinal: Positive for abdominal pain. Negative for nausea and vomiting.       Acid heartburn // indigestion  Genitourinary: Negative for dysuria.  Musculoskeletal: Positive for joint swelling and arthralgias.  Skin: Negative for rash.  Neurological: Negative for headaches.  Hematological: Does not bruise/bleed easily.  Psychiatric/Behavioral: Positive for dysphoric mood. The patient is nervous/anxious.        Objective:   Physical Exam  W/c bound obese wf nad   Wt Readings from Last 3 Encounters:  11/12/14 223 lb (101.152 kg)  11/03/14 224 lb (101.606 kg)  10/02/14 225 lb 12 oz (102.4 kg)    Vital signs reviewed   HEENT: nl dentition, turbinates, and orophanx. Nl external ear canals without cough reflex   NECK :  without JVD/Nodes/TM/ nl carotid  upstrokes bilaterally   LUNGS: no acc muscle use, surprisingly clear to A and P bilaterally without cough on insp or exp maneuvers   CV:  RRR  no s3 or murmur or increase in P2, 2+ bilateral sym lower ext edema   ABD:  Obese soft and nontender with limited  excursion   No bruits or organomegaly, bowel sounds nl  MS:  warm without deformities, calf tenderness, cyanosis or clubbing  SKIN: warm and dry without lesions    NEURO:  alert, approp, no deficits     I personally reviewed images and agree with radiology impression as follows:  CXR:     10/27/14 COPD with right basilar atelectasis and chronic right hemidiaphragm elevation. Chronic enlargement of the cardiac silhouette without definite pulmonary edema.  CTa 09/23/14 No evidence of pulmonary embolus.  Elevated right hemidiaphragm is noted with probable subsegmental atelectasis seen in the right middle lobe.  Ill-defined small densities are noted in right upper lobe concerning for possible pneumonia or inflammation.      Assessment & Plan:

## 2014-11-12 NOTE — Patient Instructions (Signed)
No change in pulmonary medications for now  02 sats above 86% are acceptable   Please schedule a follow up office visit in 6 weeks, call sooner if needed with pfts and all inhalers in hand

## 2014-11-13 ENCOUNTER — Encounter: Payer: Self-pay | Admitting: Internal Medicine

## 2014-11-13 DIAGNOSIS — R0600 Dyspnea, unspecified: Secondary | ICD-10-CM | POA: Insufficient documentation

## 2014-11-13 NOTE — Assessment & Plan Note (Signed)
Adequate control on present rx, reviewed > no change in rx needed  > would not rechallenge with ACEi in this setting

## 2014-11-13 NOTE — Assessment & Plan Note (Signed)
Clearly this is not predominantly copd>  Full pfts ordered

## 2014-11-13 NOTE — Assessment & Plan Note (Signed)
Body mass index is 38.26 kg/(m^2).  Lab Results  Component Value Date   TSH 0.742 09/29/2014     Contributing to gerd tendency/ OHS  needs to achieve and maintain neg calorie balance >f/u primary care

## 2014-11-13 NOTE — Assessment & Plan Note (Addendum)
Note HC03  35  on 10/27/14   This is classic ohs perhaps with a very mild component of copd and now that she's off acei and not smoking we should see some improvement and be able to de-escalate her care considerably, at least from a pulmonary perspective.  In the meantime a sat above 85% is perfectly reasonable.  We will need to see her back for full pfts to sort out the restrictive components to her problem but in the meantime no change in rx.  I had an extended discussion with the patient and husband  reviewing all relevant studies completed to date    Each maintenance medication was reviewed in detail including most importantly the difference between maintenance and prns and under what circumstances the prns are to be triggered using an action plan format that is not reflected in the computer generated alphabetically organized AVS.    Please see instructions for details which were reviewed in writing and the patient given a copy highlighting the part that I personally wrote and discussed at today's ov.

## 2014-11-14 ENCOUNTER — Other Ambulatory Visit: Payer: Self-pay | Admitting: Family Medicine

## 2014-11-15 NOTE — Telephone Encounter (Signed)
RF request for atorvastatin.  LOV: 10/27/14 Next ov: 12/16/14 Last written: 07/20/14 #30 w/ 6RF

## 2014-11-22 ENCOUNTER — Encounter: Payer: Self-pay | Admitting: Family Medicine

## 2014-11-22 DIAGNOSIS — E662 Morbid (severe) obesity with alveolar hypoventilation: Secondary | ICD-10-CM | POA: Insufficient documentation

## 2014-11-29 ENCOUNTER — Encounter: Payer: Self-pay | Admitting: Family Medicine

## 2014-12-09 ENCOUNTER — Encounter: Payer: Self-pay | Admitting: Family Medicine

## 2014-12-13 ENCOUNTER — Other Ambulatory Visit: Payer: Self-pay | Admitting: *Deleted

## 2014-12-13 MED ORDER — INSULIN GLARGINE 100 UNIT/ML SOLOSTAR PEN: 40.0000 [IU] | PEN_INJECTOR | Freq: Two times a day (BID) | SUBCUTANEOUS | Status: DC

## 2014-12-13 NOTE — Telephone Encounter (Signed)
RF request for lantus LOV: 10/27/14 Next ov: 12/16/14 Last written: 08/30/14 3 pens w/ 3RF

## 2014-12-16 ENCOUNTER — Ambulatory Visit: Payer: Medicare Other | Admitting: Family Medicine

## 2014-12-17 ENCOUNTER — Encounter: Payer: Self-pay | Admitting: Family Medicine

## 2014-12-20 ENCOUNTER — Ambulatory Visit: Payer: Medicare Other | Admitting: Internal Medicine

## 2014-12-21 ENCOUNTER — Encounter: Payer: Self-pay | Admitting: Internal Medicine

## 2014-12-21 ENCOUNTER — Telehealth: Payer: Self-pay | Admitting: Family Medicine

## 2014-12-21 ENCOUNTER — Encounter: Payer: Self-pay | Admitting: Family Medicine

## 2014-12-21 NOTE — Telephone Encounter (Signed)
Pt called requesting Rx for electric wheel chair and portable O2 machine.  Patient uses [facility] for O2 machines.

## 2014-12-22 ENCOUNTER — Telehealth: Payer: Self-pay | Admitting: Internal Medicine

## 2014-12-22 NOTE — Telephone Encounter (Signed)
Pls contact [facility] (resp therapy company) and see if you can get Tiera a portable oxygen tank.  She already gets her home oxygen tank through them.-thx

## 2014-12-22 NOTE — Telephone Encounter (Signed)
Magda Paganini did receive this and will be given to MW at [facility].  Called Heather and made aware. Nothing further needed

## 2014-12-22 NOTE — Telephone Encounter (Signed)
Will do rx for the wheelchair but will let pulmonology do the rx for portable oxygen.

## 2014-12-22 NOTE — Telephone Encounter (Signed)
Spoke to [place] with [employer] who has been handling pts case. She stated that pt needs to have her O2 saturation redone. She stated that pt can see either [place] or her Dr. Melvyn Novas (she stated that Dr. Morrison Old office would be best since they are familiar with the rules and regulations medicare requires for portable oxygen). She stated once this has been done notes will need to be faxed to them so they can process it and get pt her oxygen. Please advise. Thanks. (I looks like pt has been sending messages to Dr. Morrison Old office as well).

## 2014-12-22 NOTE — Telephone Encounter (Signed)
I checked the fax and nothing on this pt was received  Spoke with Nira Conn and she will refax

## 2014-12-22 NOTE — Telephone Encounter (Signed)
FYI: Spoke to pt and she stated that she has an apt on Friday at 2:00pm for a "box test". I called Dr. Morrison Old office and spoke to [place] who stated that they have pt scheduled Friday at 3:00pm for PFT and $:00pm to see Dr. Melvyn Novas. I advised her that pt needs an order for portable oxygen but [facility] needs pt to have her O2 saturation redone and faxed to [facility] so they can reorder this for pt. Wilburn Cornelia stated that she will send a message back to Dr. Morrison Old nurse to see if this can be done. I also faxed over Letter from [facility] with their/insurance requirments for this test. Wilburn Cornelia voiced understanding and their office is going to let us know if they can do the O2 saturation for pt. Pt was advised and voiced understanding.

## 2014-12-22 NOTE — Telephone Encounter (Signed)
Pls see if pt will be at home/available for me to do a home visit this Friday afternoon after 3 o'clock.  I'll have Diane block time for me so I can go to her house and do this oxygen testing. If not avail this Friday, ask about late this afternoon around 4:30 or 5 o'clock--thx

## 2014-12-23 ENCOUNTER — Other Ambulatory Visit: Payer: Self-pay | Admitting: Family Medicine

## 2014-12-23 NOTE — Telephone Encounter (Signed)
Order faxed to [facility] in [place]. Left message with pts husband to have pt call back to be advised. No consent to speak to husband.

## 2014-12-23 NOTE — Telephone Encounter (Signed)
RF request for levothyroxine LOV: 10/27/14 Next ov: 12/29/14 Last written: 02/09/14 #90 w/ 1RF 0.742 TSH done 09/29/14

## 2014-12-24 ENCOUNTER — Ambulatory Visit: Payer: Medicare Other | Admitting: Internal Medicine

## 2014-12-24 ENCOUNTER — Telehealth: Payer: Self-pay | Admitting: Internal Medicine

## 2014-12-24 NOTE — Telephone Encounter (Signed)
Patient wanted earlier appointment than 9/28. Scheduled patient to see Dr. Melvyn Novas on 9/12 and to do PFT on 9/8 Patient appreciative of earlier appointments and doesn't mind that they are 2 separate days. Nothing further needed.

## 2014-12-29 ENCOUNTER — Ambulatory Visit: Payer: Medicare Other | Admitting: Family Medicine

## 2014-12-30 ENCOUNTER — Other Ambulatory Visit: Payer: Self-pay | Admitting: Family Medicine

## 2014-12-30 ENCOUNTER — Ambulatory Visit: Payer: Medicare Other | Admitting: Family Medicine

## 2014-12-31 NOTE — Telephone Encounter (Signed)
RF request for Spiriva LOV: 10/27/14 Next ov:  01/10/15 Last written: 10/11/14 #90 w/ 0RF

## 2015-01-03 ENCOUNTER — Ambulatory Visit: Payer: Medicare Other | Admitting: Internal Medicine

## 2015-01-07 DIAGNOSIS — I5032 Chronic diastolic (congestive) heart failure: Secondary | ICD-10-CM | POA: Insufficient documentation

## 2015-01-07 DIAGNOSIS — M1990 Unspecified osteoarthritis, unspecified site: Secondary | ICD-10-CM | POA: Insufficient documentation

## 2015-01-10 ENCOUNTER — Ambulatory Visit: Payer: Medicare Other | Admitting: Family Medicine

## 2015-01-12 ENCOUNTER — Ambulatory Visit (INDEPENDENT_AMBULATORY_CARE_PROVIDER_SITE_OTHER): Payer: Medicare Other | Admitting: Family Medicine

## 2015-01-12 ENCOUNTER — Encounter: Payer: Self-pay | Admitting: Family Medicine

## 2015-01-12 VITALS — BP 160/70 | HR 60 | Temp 98.4°F | Resp 18

## 2015-01-12 DIAGNOSIS — R609 Edema, unspecified: Secondary | ICD-10-CM | POA: Diagnosis not present

## 2015-01-12 DIAGNOSIS — Z23 Encounter for immunization: Secondary | ICD-10-CM

## 2015-01-12 DIAGNOSIS — M25531 Pain in right wrist: Secondary | ICD-10-CM

## 2015-01-12 DIAGNOSIS — R251 Tremor, unspecified: Secondary | ICD-10-CM | POA: Diagnosis not present

## 2015-01-12 DIAGNOSIS — E118 Type 2 diabetes mellitus with unspecified complications: Secondary | ICD-10-CM

## 2015-01-12 DIAGNOSIS — I890 Lymphedema, not elsewhere classified: Secondary | ICD-10-CM

## 2015-01-12 DIAGNOSIS — D649 Anemia, unspecified: Secondary | ICD-10-CM | POA: Diagnosis not present

## 2015-01-12 NOTE — Progress Notes (Signed)
OFFICE VISIT  01/12/2015  CC:  Chief Complaint  Patient presents with  . Follow-up    needs order for power wheel chair   HPI:    Patient is a 69 y.o. Caucasian female who presents for 6 wk f/u:  1) pt worried about tremors she has noted for the last month at least, worse the last 1 week.  Right arm is the worst but she and husband insist her "whole body, even her head" is tremulous at some points, mornings seem to be the worst.  When sleeping, the motion ceases apparently.  Nothing helps it during day that she can identify.  Didn't start coincidental with any new med or change in med regimen.  She has been declining from a strength standpoint over the last year ever since a fall that resulted in R ankle fracture and subsequent surgery, now with chronic pain and inability to ambulate.   Looking back in my PMH section of chart, I see that I commented on her having "tremor in upper extremities last 6-7 mo" dated in 2015. She has not been seen by a neurologist for this.  2) Says lower extremity swelling is "getting worse", which is essentially her complaint every f/u visit for the last year.  No persistent change in her breathing, denies swelling in hands or abdomen.  Compression hose that were custom fit at one point no longer fit/cannot be pulled on.  She is adamant that she adheres to a low Na diet but admits she drinks "a lot" of fluids b/c she wants to keep her kidneys well hydrated.   No redness of LE's.  3) Breathing is at baseline: she has lots of variation in SOB throughout her day, often exacerbated only by a simple position change, but these periods seem to be fleeting.  She keeps her oxygen on to try to keep oxygen 90% or above.  No cough, no CP. She does not check her bp at home but has a bp cuff.  4) Right wrist pain x 1 yr since falling and hurting it (same fall that resulted in her right ankle fx). Seems to wax and wane in intensity.  She has never had the wrist x-rayed.  ROS:  no hematochezia or melena, no abd pain  Past Medical History  Diagnosis Date  . COPD (chronic obstructive pulmonary disease)     nocturnal oxygen, plus daytime use with activity  . IBS (irritable bowel syndrome)   . Edema     venous insuff + cor pulmonale  . Gallstones     asymptomatic per pt (as of 09/30/2013); mild chronic elevation of Alk phos (remainder of hepatic panel wnl).  . Hernia of abdominal wall   . Hypertension   . Hyperlipidemia   . Obesity hypoventilation syndrome     "3-4 L depending on how I feel; 24/7" (09/29/2014)  . Adrenal benign tumor   . Anemia 2015    Baseline Hb 11, normocytic.  Marland Kitchen GERD (gastroesophageal reflux disease)   . History of duodenal ulcer   . TIA (transient ischemic attack) 2012; 2014    left facial and left arm numbness since  . Chronic lower back pain     "L5-S1" (09/29/2014)  . Gout   . Anxiety and depression   . Tremor     "upper extremities; last 6-7 months" (09/28/2013)  . Multinodular goiter summer 2015    FNA 34/7/42 "Follicular lesion of undetermined significance": a follicular lesion/neoplasm can not be ruled out--Dr. Cruzita Lederer recommended  repeat bx in 6-12 mo  . Chronic diastolic CHF (congestive heart failure) 2015  . History of pneumonia "once"  . Closed right trimalleolar fracture     Surgery/fixation hardware placed.  Marland Kitchen Unspecified hypothyroidism 12/21/2013  . Type II diabetes mellitus     hx of good control per pt report + HbA1c <7% June 2015.  Marland Kitchen History of hiatal hernia   . Migraine     "@ least monthly" (09/29/2014)  . Degenerative disc disease     Cervical and lumbar  . Osteoarthritis (arthritis due to wear and tear of joints)     "back; knees; elbows; left ankle" (09/29/2014)  . Fibromyalgia     Dx'd 1993.  This has impaired her significantly.  . Cancer of right breast 2008    Lumpectomy + radiation+aromatase inhibitor (femara).  Regular f/u and mammos normal.  . Obesity, Class II, BMI 35-39.9     Past Surgical History   Procedure Laterality Date  . Lumbar laminectomy  1991; 1996  . Orif ankle fracture bimalleolar Left 08/2011  . Breast biopsy Right 2008  . Breast lumpectomy Right 2008  . Cataract extraction w/ intraocular lens implant Right 08/2013  . Cardiac catheterization  2000's X 2  . Combined hysterectomy vaginal / oophorectomy / a&p repair / sacrospinous ligament suspension  03/1989  . Orif ankle fracture Right 02/11/2014    Procedure: OPEN REDUCTION INTERNAL FIXATION (ORIF) ANKLE FRACTURE;  Surgeon: Alta Corning, MD;  Location: [facility];  Service: Orthopedics;  Laterality: Right;  . Transthoracic echocardiogram  03/2014    Mild LVH, EF 60-65%, grade I diast dysfxn.  Mild MR.  . Holter monitor  08/2014    48H:  NORMAL  . Back surgery    . Fracture surgery    . Vaginal hysterectomy    . Biopsy thyroid  05/2014    "sample wasn't good; will wait 6 months and repeat biopsy"    Outpatient Prescriptions Prior to Visit  Medication Sig Dispense Refill  . albuterol (PROVENTIL HFA;VENTOLIN HFA) 108 (90 BASE) MCG/ACT inhaler Inhale 2 puffs into the lungs every 6 (six) hours as needed for wheezing or shortness of breath. 1 Inhaler 2  . albuterol (PROVENTIL) (2.5 MG/3ML) 0.083% nebulizer solution Take 3 mLs (2.5 mg total) by nebulization every 4 (four) hours as needed for wheezing or shortness of breath. 75 mL 0  . ALPRAZolam (XANAX) 0.5 MG tablet Take 1 tablet (0.5 mg total) by mouth 2 (two) times daily as needed for anxiety or sleep. (Patient taking differently: Take 0.5 mg by mouth at bedtime as needed for sleep. ) 60 tablet 5  . atorvastatin (LIPITOR) 40 MG tablet TAKE 1 TABLET BY MOUTH DAILY 30 tablet 3  . bisoprolol (ZEBETA) 5 MG tablet Take 1 tablet (5 mg total) by mouth daily. 30 tablet 3  . cetirizine (ZYRTEC) 10 MG tablet Take 10 mg by mouth daily as needed for allergies.    . cholecalciferol (VITAMIN D) 1000 UNITS tablet Take 1,000 Units by mouth daily.    . citalopram (CELEXA) 20 MG tablet TAKE 1  TABLET BY MOUTH EVERY DAY 30 tablet 6  . clopidogrel (PLAVIX) 75 MG tablet Take 1 tablet (75 mg total) by mouth daily. 90 tablet 3  . furosemide (LASIX) 40 MG tablet Take 1 tablet (40 mg total) by mouth 2 (two) times daily. 60 tablet 6  . Hydrocodone-Acetaminophen 2.5-325 MG TABS Take 1 tablet by mouth every 6 (six) hours as needed.    Marland Kitchen  insulin aspart (NOVOLOG) 100 UNIT/ML injection 3 U SQ qAC (Patient taking differently: Inject 3 Units into the skin 3 (three) times daily with meals. ) 10 mL   . Insulin Glargine (LANTUS) 100 UNIT/ML Solostar Pen Inject 40 Units into the skin 2 (two) times daily. 3 pen 3  . letrozole (FEMARA) 2.5 MG tablet TAKE 1 TABLET BY MOUTH EVERY DAY 90 tablet 3  . levothyroxine (SYNTHROID, LEVOTHROID) 100 MCG tablet TAKE 1 TABLET BY MOUTH EVERY DAY BEFORE BREAKFAST 90 tablet 3  . Magnesium Oxide 500 MG (LAX) TABS Take 1,000 mg by mouth daily.     . meclizine (ANTIVERT) 12.5 MG tablet TAKE 1 TO 2 TABLETS BY MOUTH EVERY 6 HOURS AS NEEDED FOR NAUSEA OR DIZZINESS 60 tablet 3  . mometasone-formoterol (DULERA) 200-5 MCG/ACT AERO Inhale 2 puffs into the lungs 2 (two) times daily. 1 Inhaler 0  . morphine (MS CONTIN) 15 MG 12 hr tablet 1 tab po qAM and 2 tabs po qhs 90 tablet 0  . Multiple Vitamins-Minerals (MULTIVITAMIN PO) Take 1 tablet by mouth daily.    Marland Kitchen omeprazole (PRILOSEC) 40 MG capsule 1 tab po bid (Patient taking differently: Take 40 mg by mouth 2 (two) times daily. ) 60 capsule 3  . polyethylene glycol (MIRALAX / GLYCOLAX) packet Take 17 g by mouth daily. 14 each 0  . pregabalin (LYRICA) 150 MG capsule Take 1 capsule (150 mg total) by mouth 2 (two) times daily. 60 capsule 5  . rOPINIRole (REQUIP) 2 MG tablet 1 tab po bid (Patient taking differently: Take 2 mg by mouth 2 (two) times daily. ) 60 tablet 6  . SPIRIVA HANDIHALER 18 MCG inhalation capsule INHALE CONTENTS OF 1 CAPSULE USING HANDIHALER EVERY DAY 90 capsule 1   No facility-administered medications prior to visit.     Allergies  Allergen Reactions  . Ciprofloxacin Other (See Comments) and Itching    Unspecified--noted in old PCP's records. Unspecified--noted in old PCP's records.  . Clindamycin/Lincomycin Rash  . Fentanyl Rash    Duragesic Patch Duragesic Patch  . Indocin [Indomethacin] Rash  . Keflex [Cephalexin] Rash  . Latex Other (See Comments) and Itching    Unspecified: noted in old PCP's records. Unspecified: noted in old PCP's records.  . Lincomycin Hcl Rash  . Penicillins Rash  . Vancomycin Rash    ROS As per HPI  PE: Blood pressure 160/70, pulse 60, temperature 98.4 F (36.9 C), temperature source Oral, resp. rate 18, SpO2 89 %. Gen: Alert, well appearing, sitting up in wheelchair, wearing nasal oxygen.  Patient is oriented to person, place, time, and situation. AFFECT: pleasant, lucid thought and speech. ENT: PERRLA, EOMI.  Face symmetric, tongue midline. She has the mildest of head tremors at different points during the exam.  Essentially the only part of her body with a persistent tremor is R arm/hand and this is a fine tremor at rest and gets better if she lies her hand on lap.  No worsening of the tremor with FNF maneuver. No cogwheel rigidity of wrists, elbows, or shoulders.  Facies not significantly mask-like.  Cannot assess gait b/c pt not ambulatory. General strength 5-/5 in all areas today.  No DTR testing done today.  No sensory testing done today. Right wrist : mild diffuse TTP, including over the anatomic snuff box. No erythema, warmth, or swelling of R wrist.  R wrist ROM intact. LE's: bilat 2+ pitting edema from the knees down into feet, but a significant component of her edema appears  to by "doughy" lymphedema.  Skin has fibrous thickening, without erythema.   LABS:  Lab Results  Component Value Date   HGBA1C 6.6* 09/29/2014      Chemistry      Component Value Date/Time   NA 139 10/27/2014 1531   K 4.8 10/27/2014 1531   CL 97 10/27/2014 1531   CO2 36*  10/27/2014 1531   BUN 30* 10/27/2014 1531   CREATININE 0.96 10/27/2014 1531   CREATININE 0.91 08/20/2014 1559      Component Value Date/Time   CALCIUM 9.1 10/27/2014 1531   ALKPHOS 99 10/01/2014 0342   AST 30 10/01/2014 0342   ALT 25 10/01/2014 0342   BILITOT 0.6 10/01/2014 0342     Lab Results  Component Value Date   WBC 6.8 10/27/2014   HGB 9.1* 10/27/2014   HCT 28.1* 10/27/2014   MCV 77.3* 10/27/2014   PLT 115.0* 10/27/2014   Lab Results  Component Value Date   IRON 54 04/20/2014   TIBC 245* 04/20/2014   FERRITIN 158 04/20/2014     IMPRESSION AND PLAN:  1) Tremors: UE's for the most part.  Will ask neuro to evaluate her for this problem.  No meds rx'd for this today. Will ask her to d/c her bronchodilators as much as she can (dulera and albuterol) to see if these are contributing.  2) LE edema; venous insufficiency edema + Obesity hypoventilation syndrome likely leading to chronic cor pulmonale. Also has known chronic diastolic HF. Renal function borderline stage II/III insufficiency so have to be careful not to over-diurese just to get less swelling in legs. Will check BMET today to see if we have a little room to increase diuresis, although she is nearly constantly peeing as it is. Contin low Na diet.  Will ask  [facility] to see her to see if they have anything to add.  3) Right wrist pain, chronic--since her fall 1 yr ago. Will x-ray wrist to see if there is any remnant/sign of old bony injury.  4) Pulmonary: OHS + questionable amount of contribution from COPD per Dr. Gustavus Bryant most recent eval of her.  PFTs to be done next week, continue oxygen and shoot for 85% oxygenation as lower limit of normal for her (as per Dr. Gustavus Bryant note). No sign that her diastolic CHF is decompensated at this time.  5) DM 2: did not get to discuss this today at this busy visit, but she is due for HbA1c so I'll check this today.  6) Microcytic anemia: Hb was up last  check a couple months ago. Recently tried to get hemoccults x 3 and pt was inadvertently given hemoccult ICT cards (this came back negative). Will give pt hemoccults x 3 at next f/u in 1 week when I see her for her mobility evaluation. She'll continue on 2 iron tabs per day.  Monitor CBC today.  7) Prev health care: Flu vaccine ("regular dose") given IM today.  An After Visit Summary was printed and given to the patient.  FOLLOW UP: Return in about 1 week (around 01/19/2015) for 45 min appt mobility eval.

## 2015-01-12 NOTE — Progress Notes (Signed)
Pre visit review using our clinic review tool, if applicable. No additional management support is needed unless otherwise documented below in the visit note. 

## 2015-01-13 LAB — COMPREHENSIVE METABOLIC PANEL
ALK PHOS: 93 U/L (ref 39–117)
ALT: 14 U/L (ref 0–35)
AST: 18 U/L (ref 0–37)
Albumin: 3.8 g/dL (ref 3.5–5.2)
BUN: 33 mg/dL — ABNORMAL HIGH (ref 6–23)
CHLORIDE: 96 meq/L (ref 96–112)
CO2: 38 mEq/L — ABNORMAL HIGH (ref 19–32)
CREATININE: 0.8 mg/dL (ref 0.40–1.20)
Calcium: 9.1 mg/dL (ref 8.4–10.5)
GFR: 75.25 mL/min (ref >60.00)
Glucose, Bld: 89 mg/dL (ref 70–99)
Potassium: 4.5 mEq/L (ref 3.5–5.1)
Sodium: 139 mEq/L (ref 135–145)
TOTAL PROTEIN: 6.7 g/dL (ref 6.0–8.3)
Total Bilirubin: 0.5 mg/dL (ref 0.2–1.2)

## 2015-01-13 LAB — CBC WITH DIFFERENTIAL/PLATELET
BASOS ABS: 0 10*3/uL (ref 0.0–0.1)
BASOS PCT: 0.1 % (ref 0.0–3.0)
EOS ABS: 0.1 10*3/uL (ref 0.0–0.7)
Eosinophils Relative: 2.7 % (ref 0.0–5.0)
HCT: 29.7 % — ABNORMAL LOW (ref 36.0–46.0)
HEMOGLOBIN: 9.8 g/dL — AB (ref 12.0–15.0)
LYMPHS PCT: 22.4 % (ref 12.0–46.0)
Lymphs Abs: 0.7 10*3/uL (ref 0.7–4.0)
MCHC: 32.9 g/dL (ref 30.0–36.0)
MCV: 77.2 fl — ABNORMAL LOW (ref 78.0–100.0)
Monocytes Absolute: 0.2 10*3/uL (ref 0.1–1.0)
Monocytes Relative: 6.1 % (ref 3.0–12.0)
Neutro Abs: 2 10*3/uL (ref 1.4–7.7)
Neutrophils Relative %: 68.7 % (ref 43.0–77.0)
Platelets: 104 10*3/uL — ABNORMAL LOW (ref 150.0–400.0)
RBC: 3.85 Mil/uL — AB (ref 3.87–5.11)
RDW: 16.2 % — AB (ref 11.5–15.5)
WBC: 3 10*3/uL — ABNORMAL LOW (ref 4.0–10.5)

## 2015-01-13 LAB — HEMOGLOBIN A1C: Hgb A1c MFr Bld: 5.6 % (ref 4.6–6.5)

## 2015-01-20 ENCOUNTER — Ambulatory Visit: Payer: Medicare Other | Admitting: Family Medicine

## 2015-01-21 ENCOUNTER — Ambulatory Visit (INDEPENDENT_AMBULATORY_CARE_PROVIDER_SITE_OTHER): Payer: Medicare Other | Admitting: Internal Medicine

## 2015-01-21 DIAGNOSIS — R0600 Dyspnea, unspecified: Secondary | ICD-10-CM

## 2015-01-21 HISTORY — PX: OTHER SURGICAL HISTORY: SHX169

## 2015-01-21 LAB — PULMONARY FUNCTION TEST
DL/VA % pred: 114 %
DL/VA: 5.48 ml/min/mmHg/L
DLCO unc % pred: 46 %
DLCO unc: 11.17 ml/min/mmHg
FEF 25-75 POST: 0.69 L/s
FEF 25-75 PRE: 0.44 L/s
FEF2575-%CHANGE-POST: 59 %
FEF2575-%PRED-POST: 37 %
FEF2575-%PRED-PRE: 23 %
FEV1-%Change-Post: 6 %
FEV1-%PRED-POST: 28 %
FEV1-%PRED-PRE: 26 %
FEV1-POST: 0.64 L
FEV1-PRE: 0.6 L
FEV1FVC-%CHANGE-POST: 0 %
FEV1FVC-%Pred-Pre: 104 %
FEV6-%CHANGE-POST: 7 %
FEV6-%Pred-Post: 27 %
FEV6-%Pred-Pre: 25 %
FEV6-PRE: 0.73 L
FEV6-Post: 0.78 L
FEV6FVC-%Change-Post: 2 %
FEV6FVC-%Pred-Post: 104 %
FEV6FVC-%Pred-Pre: 101 %
FVC-%CHANGE-POST: 7 %
FVC-%PRED-PRE: 25 %
FVC-%Pred-Post: 27 %
FVC-PRE: 0.75 L
FVC-Post: 0.81 L
POST FEV1/FVC RATIO: 79 %
PRE FEV1/FVC RATIO: 80 %
Post FEV6/FVC ratio: 99 %
Pre FEV6/FVC Ratio: 96 %

## 2015-01-24 ENCOUNTER — Ambulatory Visit: Payer: Medicare Other | Admitting: Internal Medicine

## 2015-01-24 DIAGNOSIS — R0600 Dyspnea, unspecified: Secondary | ICD-10-CM

## 2015-01-24 NOTE — Progress Notes (Signed)
PFT done today. 

## 2015-01-25 ENCOUNTER — Ambulatory Visit: Payer: Medicare Other | Admitting: Internal Medicine

## 2015-01-27 ENCOUNTER — Ambulatory Visit: Payer: Medicare Other | Admitting: Neurology

## 2015-01-30 ENCOUNTER — Other Ambulatory Visit: Payer: Self-pay | Admitting: Family Medicine

## 2015-02-01 ENCOUNTER — Encounter: Payer: Self-pay | Admitting: Family Medicine

## 2015-02-01 ENCOUNTER — Telehealth: Payer: Self-pay | Admitting: *Deleted

## 2015-02-01 NOTE — Telephone Encounter (Signed)
Cory with Hospice called and stated that pts daughter is requesting that pt be evaluated by home health nurse for at home care evaluation since pts husband is not at home to help. He also requested a copy of her last ov. Please advise. Thanks.

## 2015-02-01 NOTE — Telephone Encounter (Signed)
Opened in Error.

## 2015-02-03 ENCOUNTER — Encounter: Payer: Self-pay | Admitting: Family Medicine

## 2015-02-04 ENCOUNTER — Ambulatory Visit (INDEPENDENT_AMBULATORY_CARE_PROVIDER_SITE_OTHER): Payer: Medicare Other | Admitting: Family Medicine

## 2015-02-04 ENCOUNTER — Encounter: Payer: Self-pay | Admitting: Family Medicine

## 2015-02-04 VITALS — BP 138/74 | HR 57 | Temp 98.9°F | Resp 20

## 2015-02-04 DIAGNOSIS — Z7409 Other reduced mobility: Secondary | ICD-10-CM | POA: Diagnosis not present

## 2015-02-04 DIAGNOSIS — Z789 Other specified health status: Secondary | ICD-10-CM | POA: Insufficient documentation

## 2015-02-04 DIAGNOSIS — J438 Other emphysema: Secondary | ICD-10-CM

## 2015-02-04 DIAGNOSIS — E662 Morbid (severe) obesity with alveolar hypoventilation: Secondary | ICD-10-CM

## 2015-02-04 NOTE — Telephone Encounter (Signed)
Yes

## 2015-02-04 NOTE — Telephone Encounter (Signed)
Per your conversation with Georgina Snell today, this has been taken care of, right?

## 2015-02-04 NOTE — Progress Notes (Signed)
Pre visit review using our clinic review tool, if applicable. No additional management support is needed unless otherwise documented below in the visit note. 

## 2015-02-04 NOTE — Progress Notes (Signed)
69 y/o WF here today for mobility evaluation and to test oxygen in order to see if she qualifies for home oxygen (which she is already on).  She has no acute complaints.  Past Medical History  Diagnosis Date  . COPD (chronic obstructive pulmonary disease) (HCC)     nocturnal oxygen, plus daytime use with activity  . IBS (irritable bowel syndrome)   . Edema     venous insuff + cor pulmonale  . Gallstones     asymptomatic per pt (as of 09/30/2013); mild chronic elevation of Alk phos (remainder of hepatic panel wnl).  . Hernia of abdominal wall   . Hypertension   . Hyperlipidemia   . Obesity hypoventilation syndrome ([place])     "3-4 L depending on how I feel; 24/7" (09/29/2014)  . Adrenal benign tumor   . Anemia 2015    Baseline Hb 11, normocytic.  Marland Kitchen GERD (gastroesophageal reflux disease)   . History of duodenal ulcer   . TIA (transient ischemic attack) 2012; 2014    left facial and left arm numbness since  . Chronic lower back pain     "L5-S1" (09/29/2014)  . Gout   . Anxiety and depression   . Tremor     "upper extremities; last 6-7 months" (09/28/2013)  . Multinodular goiter summer 2015    FNA 26/7/12 "Follicular lesion of undetermined significance": a follicular lesion/neoplasm can not be ruled out--Dr. Cruzita Lederer recommended repeat bx in 6-12 mo  . Chronic diastolic CHF (congestive heart failure) ([place]) 2015  . History of pneumonia "once"  . Closed right trimalleolar fracture     Surgery/fixation hardware placed.  Marland Kitchen Unspecified hypothyroidism 12/21/2013  . Type II diabetes mellitus (HCC)     hx of good control per pt report + HbA1c <7% June 2015.  Marland Kitchen History of hiatal hernia   . Migraine     "@ least monthly" (09/29/2014)  . Degenerative disc disease     Cervical and lumbar  . Osteoarthritis (arthritis due to wear and tear of joints)     "back; knees; elbows; left ankle" (09/29/2014)  . Fibromyalgia     Dx'd 1993.  This has impaired her significantly.  . Cancer of right breast ([place])  2008    Lumpectomy + radiation+aromatase inhibitor (femara).  Regular f/u and mammos normal.  . Obesity, Class II, BMI 35-39.9    PSH reviewed.  Gen: alert, oriented x 4.   AFFECT: pleasant, lucid thought and speech.   1) This patient has the following mobility-related ADL(s) that will be significantly improved in the home:  Mobilize to the restroom for toileting, mobilizing to the kitchen to get something to eat, mobilizing around her home in general.   2) A cane or walker will not allow for improvement in mobility-related ADL's in home because:  Patient does not have the LE strength to stand, much less to ambulate with use of a cane or walker.  She also lacks the balance needed for a cane or walker.  3) A manual wheelchair will not allow for improvement in mobility-related ADL's in home because:  Patient cannot use manual wheelchair due to chronic fatigue and dyspnea AT REST plus poor UE strength and calluses on hands from pushing the wheels on manual wheelchair.  (see objective measurements below)  4) A scooter will not allow for improvement in mobility-related ADL's in home because:  Pt has poor upper and lower body strength and cannot get on/off a scooter and cannot balance on a scooter  seat or utilize a handlebar steering control.  5) In my opinion, this patient can physically and mentally operate the power wheelchair safely.  6) Physical exam:  UE strength: 3/5                        UE ROM:  Fully intact  LE strength:  3/5                        LE ROM:   Diminished in hips to 30 deg flexion and extension bilat  Diminished in knees to 90 deg flexion and to 170 deg extension.  Diminished in R ankle (no range of motion in this ankle).  L ankle ROM is fully intact.  Distance with mobility aide:  NONE (she is unable to ambulate 1 step with a walker. She can use her arms to move her manual wheelchair about 4 pushes before she has to stop and rest).  Oxygen SAT levels:  83% on RA at  rest.  Goes up between 90 and 94% on 4 L oxygen at rest.  Overall pain scale:  8/10  Patient's weight:  221 lbs   A/P: Impaired mobility/ADLs. Mobility evaluation done today in order to obtain necessary mobility device (power wheelchair).  Spent 35 min with pt today, with >50% of this time spent in counseling and care coordination regarding the above problems.

## 2015-02-06 ENCOUNTER — Other Ambulatory Visit: Payer: Self-pay | Admitting: Family Medicine

## 2015-02-07 NOTE — Telephone Encounter (Signed)
DR McGowen pt. RF request for alprazolam. Last RX 07/06/14 x 5 rfs.  Last OV 02/04/15.  Please advise.

## 2015-02-07 NOTE — Telephone Encounter (Signed)
rx faxed

## 2015-02-10 ENCOUNTER — Telehealth: Payer: Self-pay | Admitting: *Deleted

## 2015-02-10 NOTE — Telephone Encounter (Signed)
No action needed. This is just a note for record. I received a call yesterday (02/09/15 at 4:30pm) from [place] who works for [employer]). She stated that she needs Dr. Anitra Lauth to make an addendum to pts ov note on 02/04/15. She stated that the section of pts note stating that her oxygen was 90%-94% on 4Lmp at rest needs to say at exercise. She also stated that she needs new orders faxed over with order for 4Lpm when exercising w/ conserving device POC plus an additional gas tank for emergencies. I asker her why we needed to make these changed because per Medicare guidelines pt automatically qualifies for portable oxygen based off her RA at rest oxygen of 83%. She stated that medicare will not cover this unless we make to changes to show that pts oxygen does recover with the 4Lmp of oxygen when exercising. After ending call with Lyn Hollingshead called [facility] and spoke to [place] and she stated that this office always calls and no need to changed any office notes or orders. She stated that pts case manager Rodena Piety will be in tomorrow (02/10/15) and I can call back and discuss this with her. I spoke to [place] today (02/10/15 at 1:55pm) she stated that she is still reviewing pts notes and orders but that based off what she has seen pt automatically qualifies due to her RA oxygen level of 83% at rest. She stated that the only reason we would need to have the pt move/exercise is if her RA at rest oxygen was >88% then we would need to have her ambulate/exercise to see if her oxygen dropped below 88%. She stated that there is no need to make any changeds at this time and that she will contact us if there is any problems.

## 2015-02-11 ENCOUNTER — Ambulatory Visit: Payer: Medicare Other | Admitting: Neurology

## 2015-02-14 ENCOUNTER — Ambulatory Visit (INDEPENDENT_AMBULATORY_CARE_PROVIDER_SITE_OTHER): Payer: Medicare Other | Admitting: Internal Medicine

## 2015-02-14 ENCOUNTER — Encounter: Payer: Self-pay | Admitting: Internal Medicine

## 2015-02-14 VITALS — BP 132/60 | HR 61 | Ht 64.0 in | Wt 221.0 lb

## 2015-02-14 DIAGNOSIS — J9612 Chronic respiratory failure with hypercapnia: Secondary | ICD-10-CM | POA: Diagnosis not present

## 2015-02-14 DIAGNOSIS — J449 Chronic obstructive pulmonary disease, unspecified: Secondary | ICD-10-CM

## 2015-02-14 NOTE — Progress Notes (Signed)
Subjective:   Patient ID: Brooke Mccullough, female    DOB: [birth year],    MRN: [id number]    Brief patient profile:  41 yowf quit smoking 2015 with onset of doe x around 1990s  eval by [place] pulmonary doctor dx of copd and on 02 x 7 years referred by Dr Anitra Lauth to pulmonary clinic  11/12/2014 with very little airflow obst on spirometry on initial visit despite  being characterized as possible endstage copd as recently as her last admit when her ACEi were stopped due to renal insufficiency  Admit date: 09/29/2014 Discharge date: 10/02/2014   Recommendations for Outpatient Follow-up:  1.  to meds as below Modify on Discharge  - furosemide (LASIX) 40 MG tablet Stop Taking on Discharge  - clarithromycin (BIAXIN) 500 MG tablet - lisinopril (PRINIVIL,ZESTRIL) 10 MG tablet Order on Discharge  - bisoprolol (ZEBETA) 5 MG tablet - mometasone-formoterol (DULERA) 200-5 MCG/ACT AERO  2. Highly recommned close follow up Geriatrician Dr. [name] as OP 3. Follow up c Ortho as prn 4. Follow up with pain Management as prn 5. Pulmonology should set-up follow up as OP 6. Needs bmet and cbc 1 week PCP office 7. recommned TSH when at steady state as HPA axis can be deranged with chronic pain-defer to Pain management to assit with this Dr. [name] 8. Needs OP Oncology surveillance-She is on Letrozole which can cause TCP and this might need to be adjusted 9. Resume HH with [facility] PT and [facility]  Discharge Diagnoses:  Principal Problem:  Acute renal failure Active Problems:  Fibromyalgia  Chronic pain syndrome  Chronic renal insufficiency, stage III (moderate)  Hypothyroidism  HTN (hypertension), benign  Closed right trimalleolar fracture  COPD (chronic obstructive pulmonary disease)  Chronic diastolic heart failure, NYHA class 1  Thrombocytopenia (chronic)  Diabetes type 2, controlled  Weakness  Chronic respiratory failure with hypoxia  Chronic respiratory failure   Discharge  Condition: fair  Diet recommendation: heart healthy low salt  Filed Weights   09/30/14 1000 10/02/14 0523  Weight: 101.152 kg (223 lb) 102.4 kg (225 lb 12 oz)    History of present illness:  69 ? recent R ankle # and prolonged CIR stay till 05/2014, DM ty II on insulin, Htn, HLd, COPD Gold stage III-IV [end stage per PCP?] on night time O2, Diastolic HF foll by Dr. Stanford Breed Brooke Mccullough weight 208?]-last echo 04/22/14=EF 60-65% prior breast CA s/p Lumpectomy 2008, TMN goiter follwed by Dr. [name], Multiple TIA 2012/2014, Cardio-renal syndrome with baseline CKD III long-standing anemia-TCP Fibromyalgia  1993, Chronic pain followed by Dr. [name] admitted with inability to bear weight as well as overall weakness She has had overall Adult FTT picture per PCP notes documented and has been weaker and weaker since her ankle injury in 05/2014  Hospital Course:    1. Multifactorial ATS grade 3 dyspnea-smoker X-37 years quit 8466, diastolic heart failure and probable restrictive component. See below 2. Stage 3 Gold COPD on nocturnal oxygen-appreciate pulmonology consulted. Defer PFTs at present time. Continue tiotropium 18 g daily, change albuterol to 5 mg every 4 when necessary wheeze, add Dulera 200 g 2 puffs twice a day.  3. Acute decompensated diastolic heart failure-last echocardiogram = 03/2014 = EF 60-65%, dry weight? 208, currently 223 pounds. Unfortunately has also cardiorenal syndrome making diuresis challenging. We will saline lock. Her Lasix 60 twice a day was  to 40 po bid by cardiology and she will continue this dose on d/c home. Add TED hose  4. Acute kidney  injury-see above discussion. Baseline CK D stage III -will need to keep volumes even. Gfr ? from 32 on admit-->41.  5. Toxic multinodular goiter-outpatient follow-up with endocrinology-apparently has had treatment? Currently on Synthroid 100 g daily. 6. Multiple TIAs 2012/2014-continue Plavix 75 daily. 7. Breast cancer  status post lumpectomy 2008-continue letrozole 2.5 daily. 8. Hypertension-lisinopril 10 mg daily on hold because of #4 going forward may not benefit from this -bisoprolol 5 added 6/10 9. Hyperlipidemia-continue atorvastatin 40 mg daily for secondary prevention. 10. Right nonhealing wound to foot-see's [employer] for second opinion-status post surgery 05/2014-continue morphine MS Contin 15 mg every 12-has a pain management physician Dr. [name] who she will follow up with. No pain medications will be refilled on discharge from hospital. Continue Lyrica 150 twice a day. 11. Restless leg syndrome continue Requip 2 mg 1 tab twice a day. 18. Thrombocytopenia acute/chronic-1.1% of cases, class effect of letrozole-follow-up as an outpatient. risk bleeding ?SCD's.  12. Adult failure to thrive-complex situation. Carries a diagnosis of fibromyalgia since 1993, multiple mood medications and also documented history in chart of patient's gradual decline since surgery        11/12/2014 1st [facility] office visit/ Brooke Mccullough   Chief Complaint  Patient presents with  . PULMONARY CONSULT    Referred by Dr Anitra Lauth. Recent cxr and CT chest.   When moved to [place] May 2015 was off 02 x 3 y and since then back on 02 x 3-4 liters 24/7  Each am dulera x 2 / spiriva dpi  More limited by wt and legs than breathing  rec No change in pulmonary medications for now 02 sats above 86% are acceptable      02/14/2015  f/u ov/Brooke Mccullough re:  Chief Complaint  Patient presents with  . Follow-up    Pt here to review PFT results- test done 01/21/15. Pt states since she has been using new POC she feels like she is not getting enough air.    maint rx spiriva with rare need for saba  No obvious day to day or daytime variability or assoc chronic cough or cp or chest tightness, subjective wheeze or overt sinus or hb symptoms. No unusual exp hx or h/o childhood pna/ asthma or knowledge of premature birth.  Sleeping ok  without nocturnal  or early am exacerbation  of respiratory  c/o's or need for noct saba. Also denies any obvious fluctuation of symptoms with weather or environmental changes or other aggravating or alleviating factors except as outlined above   Current Medications, Allergies, Complete Past Medical History, Past Surgical History, Family History, and Social History were reviewed in [employer] record.  ROS  The following are not active complaints unless bolded sore throat, dysphagia, dental problems, itching, sneezing,  nasal congestion or excess/ purulent secretions, ear ache,   fever, chills, sweats, unintended wt loss, classically pleuritic or exertional cp, hemoptysis,  orthopnea pnd or leg swelling, presyncope, palpitations, abdominal pain, anorexia, nausea, vomiting, diarrhea  or change in bowel or bladder habits, change in stools or urine, dysuria,hematuria,  rash, arthralgias, visual complaints, headache, numbness, weakness or ataxia or problems with walking or coordination,  change in mood/affect or memory.                               Objective:   Physical Exam  W/c bound obese wf nad  02/14/2015      Wt Readings from Last 3  Encounters:  11/12/14 223 lb (101.152 kg)  11/03/14 224 lb (101.606 kg)  10/02/14 225 lb 12 oz (102.4 kg)    Vital signs reviewed   HEENT: nl dentition, turbinates, and orophanx. Nl external ear canals without cough reflex   NECK :  without JVD/Nodes/TM/ nl carotid upstrokes bilaterally   LUNGS: no acc muscle use, surprisingly clear to A and P bilaterally without cough on insp or exp maneuvers   CV:  RRR  no s3 or murmur or increase in P2,  2+ bilateral sym lower ext edema   ABD:  Obese soft and nontender with limited  excursion   No bruits or organomegaly, bowel sounds nl  MS:  warm without deformities, calf tenderness, cyanosis or clubbing  SKIN: warm and dry without lesions    NEURO:  alert, approp, no  deficits     I personally reviewed images and agree with radiology impression as follows:  CXR:    10/27/14 COPD with right basilar atelectasis and chronic right hemidiaphragm elevation. Chronic enlargement of the cardiac silhouette without definite pulmonary edema.  CTa 09/23/14 No evidence of pulmonary embolus. Elevated right hemidiaphragm is noted with probable subsegmental atelectasis seen in the right middle lobe. Ill-defined small densities are noted in right upper lobe concerning for possible pneumonia or inflammation.      Assessment & Plan:

## 2015-02-14 NOTE — Patient Instructions (Signed)
Try off spiriva to see what difference if any this makes and keep your rescue inhaler/nebulizer handy   Your main problem with your breathing is your weight   The goal with the oxygen is a sat above 86 but never above 96%   Pulmonary follow up is as needed

## 2015-02-18 ENCOUNTER — Encounter: Payer: Self-pay | Admitting: Internal Medicine

## 2015-02-18 NOTE — Assessment & Plan Note (Signed)
Note HC03  36  on 10/27/14   She self monitors and advised any sats above 85% are ok in a pt with hypercarbia but we certainly don't want them above 95

## 2015-02-18 NOTE — Assessment & Plan Note (Addendum)
PFT's  01/21/15  FEV1 0.64 (28%)   ratio 79  p no % improvement from saba with DLCO  46 % corrects to 114 % for alv volume   and fev1/vc = 65%   So there is airflow obst though it is minimal and technically does not meet the criteria for copd.  Her main problem is obesity and it may well be what little airflow obst she does have doesn't require spiriva so ok to try off and see what effect this has on sob or need for saba and restart if needed.   I had an extended discussion with the patient reviewing all relevant studies completed to date and  lasting 15 to 20 minutes of a 25 minute visit    Each maintenance medication was reviewed in detail including most importantly the difference between maintenance and prns and under what circumstances the prns are to be triggered using an action plan format that is not reflected in the computer generated alphabetically organized AVS.    Please see instructions for details which were reviewed in writing and the patient given a copy highlighting the part that I personally wrote and discussed at today's ov.

## 2015-02-18 NOTE — Assessment & Plan Note (Signed)
Complicated by hbp/hyperlipidemai/ ohs   Body mass index is 37.92 trending down   Lab Results  Component Value Date   TSH 0.742 09/29/2014     Contributing to gerd tendency/ doe/reviewed the need and the process to achieve and maintain neg calorie balance > defer f/u primary care including intermittently monitoring thyroid status

## 2015-02-21 ENCOUNTER — Other Ambulatory Visit: Payer: Self-pay | Admitting: Family Medicine

## 2015-02-21 NOTE — Telephone Encounter (Signed)
RF request for bisoprolol LOV: 02/04/15 Next ov: None Last written: 11/05/14 #30 w/ 3RF

## 2015-02-22 DIAGNOSIS — Z4789 Encounter for other orthopedic aftercare: Secondary | ICD-10-CM | POA: Diagnosis not present

## 2015-02-27 ENCOUNTER — Other Ambulatory Visit: Payer: Self-pay | Admitting: Family Medicine

## 2015-02-28 NOTE — Telephone Encounter (Signed)
RF request for requip LOV: 02/04/15 Next ov: None Last written: 07/08/14 #60 w/ 6RF

## 2015-03-08 ENCOUNTER — Other Ambulatory Visit: Payer: Self-pay | Admitting: Family Medicine

## 2015-03-08 NOTE — Telephone Encounter (Signed)
RF request for omeprazole LOV: 02/04/15 Next ov: None Last written: 01/31/15 #60 w/ 0RF

## 2015-03-14 ENCOUNTER — Telehealth: Payer: Self-pay | Admitting: Family Medicine

## 2015-03-14 NOTE — Telephone Encounter (Signed)
Brooke Mccullough is returning [employer] phone call in regards to Brooke Mccullough. [phone]

## 2015-03-14 NOTE — Telephone Encounter (Signed)
Left message for Monica to call back.

## 2015-03-14 NOTE — Telephone Encounter (Signed)
Tried calling back number busy  

## 2015-03-15 DIAGNOSIS — Z4789 Encounter for other orthopedic aftercare: Secondary | ICD-10-CM

## 2015-03-16 ENCOUNTER — Ambulatory Visit: Payer: Medicare Other | Admitting: Family Medicine

## 2015-03-20 ENCOUNTER — Other Ambulatory Visit: Payer: Self-pay | Admitting: Family Medicine

## 2015-03-21 NOTE — Telephone Encounter (Signed)
RF request for atorvastatin LOV: 02/04/15 Next ov: None Last written: 11/15/14 #30 w/ 3RF

## 2015-03-28 ENCOUNTER — Ambulatory Visit: Payer: Medicare Other | Admitting: Internal Medicine

## 2015-03-28 ENCOUNTER — Other Ambulatory Visit: Payer: Self-pay | Admitting: *Deleted

## 2015-03-29 ENCOUNTER — Other Ambulatory Visit: Payer: Self-pay | Admitting: *Deleted

## 2015-03-29 ENCOUNTER — Other Ambulatory Visit: Payer: Self-pay | Admitting: Family Medicine

## 2015-03-29 NOTE — Telephone Encounter (Signed)
RF request for meclizine LOV: 02/14/15 Next ov: 03/30/15 Last written: 09/08/14 #60 w/ 3RF

## 2015-03-29 NOTE — Telephone Encounter (Signed)
RF request for alprazolam LOV: 02/04/15 Next ov:  03/30/15 Last written: 02/07/15 360 w/ 0RF  Please advise. Thanks.

## 2015-03-30 ENCOUNTER — Encounter: Payer: Self-pay | Admitting: Family Medicine

## 2015-03-30 ENCOUNTER — Ambulatory Visit (INDEPENDENT_AMBULATORY_CARE_PROVIDER_SITE_OTHER): Payer: Medicare Other | Admitting: Family Medicine

## 2015-03-30 ENCOUNTER — Other Ambulatory Visit: Payer: Self-pay | Admitting: Family Medicine

## 2015-03-30 VITALS — BP 163/72 | HR 57 | Temp 98.1°F | Resp 16

## 2015-03-30 DIAGNOSIS — G250 Essential tremor: Secondary | ICD-10-CM | POA: Diagnosis not present

## 2015-03-30 DIAGNOSIS — E662 Morbid (severe) obesity with alveolar hypoventilation: Secondary | ICD-10-CM | POA: Diagnosis not present

## 2015-03-30 DIAGNOSIS — T402X5A Adverse effect of other opioids, initial encounter: Secondary | ICD-10-CM

## 2015-03-30 DIAGNOSIS — J9612 Chronic respiratory failure with hypercapnia: Secondary | ICD-10-CM

## 2015-03-30 DIAGNOSIS — I10 Essential (primary) hypertension: Secondary | ICD-10-CM

## 2015-03-30 DIAGNOSIS — E039 Hypothyroidism, unspecified: Secondary | ICD-10-CM

## 2015-03-30 DIAGNOSIS — D61818 Other pancytopenia: Secondary | ICD-10-CM

## 2015-03-30 DIAGNOSIS — R202 Paresthesia of skin: Secondary | ICD-10-CM

## 2015-03-30 DIAGNOSIS — E118 Type 2 diabetes mellitus with unspecified complications: Secondary | ICD-10-CM | POA: Diagnosis not present

## 2015-03-30 DIAGNOSIS — K5903 Drug induced constipation: Secondary | ICD-10-CM

## 2015-03-30 DIAGNOSIS — Z794 Long term (current) use of insulin: Secondary | ICD-10-CM

## 2015-03-30 MED ORDER — ALPRAZOLAM 0.5 MG PO TABS: ORAL_TABLET | ORAL | Status: DC

## 2015-03-30 MED ORDER — PREGABALIN 150 MG PO CAPS: ORAL_CAPSULE | ORAL | Status: AC

## 2015-03-30 MED ORDER — LINACLOTIDE 145 MCG PO CAPS: 145.0000 ug | ORAL_CAPSULE | Freq: Every day | ORAL | Status: AC

## 2015-03-30 NOTE — Progress Notes (Signed)
OFFICE VISIT  03/30/2015   CC:  Chief Complaint  Patient presents with  . Shaking    x 2 months   HPI:    Patient is a 69 y.o. Caucasian female who presents for "shaking". Describes not only chronic burning/tingling/numbness in feet but also now has it in all fingers and both palms.   Has had shaking in UE's for at least a year now, worse with trying to hold something or reach for something.  Drops tea cup easily, has trouble putting makeup on sometimes.  Seems to be worse in evenings she thinks.  Continues to complain of chronic SOB, esp with exertion, wears 3-4 L oxygen 24/7, trying to keep sats 85-95%.    Complains of constipation since being on narcotic pain meds chronically.  Miralax and otc stool softener use daily in the past did not help this.  She typically has a BM q3-7d and strains a lot to get BM out, occ with BRBPR after lots of straining.  No melena.  Saw Dr. Renaldo Reel at [facility] since I last saw her.  His impression was that she had primarily lymphedema, recommended LE dopplers (which she had to cancel) and remarked that she may be a candidate for sequential compression stockings--she needs to follow up in his office. No signif recent change in her chronic severe LE edema.  Past Medical History  Diagnosis Date  . COPD (chronic obstructive pulmonary disease) (HCC)     nocturnal oxygen, plus daytime use with activity  . IBS (irritable bowel syndrome)   . Edema     venous insuff + cor pulmonale  . Gallstones     asymptomatic per pt (as of 09/30/2013); mild chronic elevation of Alk phos (remainder of hepatic panel wnl).  . Hernia of abdominal wall   . Hypertension   . Hyperlipidemia   . Obesity hypoventilation syndrome ([facility])     "3-4 L depending on how I feel; 24/7" (09/29/2014)  . Adrenal benign tumor   . Anemia 2015    Baseline Hb 11, normocytic.  Marland Kitchen GERD (gastroesophageal reflux disease)   . History of duodenal ulcer   . TIA (transient  ischemic attack) 2012; 2014    left facial and left arm numbness since  . Chronic lower back pain     "L5-S1" (09/29/2014)  . Gout   . Anxiety and depression   . Tremor     "upper extremities; last 6-7 months" (09/28/2013)  . Multinodular goiter summer 2015    FNA 90/2/11 "Follicular lesion of undetermined significance": a follicular lesion/neoplasm can not be ruled out--Dr. Cruzita Lederer recommended repeat bx in 6-12 mo  . Chronic diastolic CHF (congestive heart failure) ([facility]) 2015  . History of pneumonia "once"  . Closed right trimalleolar fracture     Surgery/fixation hardware placed.  Marland Kitchen Unspecified hypothyroidism 12/21/2013  . Type II diabetes mellitus (HCC)     hx of good control per pt report + HbA1c <7% June 2015.  Marland Kitchen History of hiatal hernia   . Migraine     "@ least monthly" (09/29/2014)  . Degenerative disc disease     Cervical and lumbar  . Osteoarthritis (arthritis due to wear and tear of joints)     "back; knees; elbows; left ankle" (09/29/2014)  . Fibromyalgia     Dx'd 1993.  This has impaired her significantly.  . Cancer of right breast ([facility]) 2008    Lumpectomy + radiation+aromatase inhibitor (femara).  Regular f/u and mammos normal.  .  Obesity, Class II, BMI 35-39.9     Past Surgical History  Procedure Laterality Date  . Lumbar laminectomy  1991; 1996  . Orif ankle fracture bimalleolar Left 08/2011  . Breast biopsy Right 2008  . Breast lumpectomy Right 2008  . Cataract extraction w/ intraocular lens implant Right 08/2013  . Cardiac catheterization  2000's X 2  . Combined hysterectomy vaginal / oophorectomy / a&p repair / sacrospinous ligament suspension  03/1989  . Orif ankle fracture Right 02/11/2014    Procedure: OPEN REDUCTION INTERNAL FIXATION (ORIF) ANKLE FRACTURE;  Surgeon: Alta Corning, MD;  Location: [facility];  Service: Orthopedics;  Laterality: Right;  . Transthoracic echocardiogram  03/2014    Mild LVH, EF 60-65%, grade I diast dysfxn.  Mild MR.  . Holter monitor   08/2014    48H:  NORMAL  . Back surgery    . Fracture surgery    . Vaginal hysterectomy    . Biopsy thyroid  05/2014    "sample wasn't good; will wait 6 months and repeat biopsy"  . Pfts  01/21/15    Obstructive airway dz with unclear severity and superimposed restrictive dz   MEDS: no longer on any inhalers Outpatient Prescriptions Prior to Visit  Medication Sig Dispense Refill  . ALPRAZolam (XANAX) 0.5 MG tablet TAKE 1 TABLET BY MOUTH TWICE DAILY AS NEEDED FOR ANXIETY OR SLEEP 60 tablet 5  . atorvastatin (LIPITOR) 40 MG tablet TAKE 1 TABLET BY MOUTH DAILY 30 tablet 11  . bisoprolol (ZEBETA) 5 MG tablet TAKE 1 TABLET(5 MG) BY MOUTH DAILY 30 tablet 11  . cetirizine (ZYRTEC) 10 MG tablet Take 10 mg by mouth daily as needed for allergies.    . cholecalciferol (VITAMIN D) 1000 UNITS tablet Take 1,000 Units by mouth daily.    . citalopram (CELEXA) 20 MG tablet TAKE 1 TABLET BY MOUTH EVERY DAY 30 tablet 11  . clopidogrel (PLAVIX) 75 MG tablet Take 1 tablet (75 mg total) by mouth daily. 90 tablet 3  . ferrous sulfate 325 (65 FE) MG tablet Take 325 mg by mouth 2 (two) times daily with a meal.    . furosemide (LASIX) 40 MG tablet Take 1 tablet (40 mg total) by mouth 2 (two) times daily. 60 tablet 6  . Hydrocodone-Acetaminophen 2.5-325 MG TABS Take 1 tablet by mouth every 6 (six) hours as needed.    . insulin aspart (NOVOLOG) 100 UNIT/ML injection 3 U SQ qAC (Patient taking differently: Inject 3 Units into the skin 3 (three) times daily with meals. ) 10 mL   . Insulin Glargine (LANTUS) 100 UNIT/ML Solostar Pen Inject 40 Units into the skin 2 (two) times daily. 3 pen 3  . letrozole (FEMARA) 2.5 MG tablet TAKE 1 TABLET BY MOUTH EVERY DAY 90 tablet 3  . levothyroxine (SYNTHROID, LEVOTHROID) 100 MCG tablet TAKE 1 TABLET BY MOUTH EVERY DAY BEFORE BREAKFAST 90 tablet 3  . Magnesium Oxide 500 MG (LAX) TABS Take 1,000 mg by mouth daily.     . meclizine (ANTIVERT) 12.5 MG tablet TAKE 1 TO 2 TABLETS BY MOUTH  EVERY 6 HOURS AS NEEDED FOR NAUSEA OR DIZZINESS 60 tablet 6  . morphine (MS CONTIN) 30 MG 12 hr tablet Take 30 mg by mouth every 12 (twelve) hours.    Marland Kitchen omeprazole (PRILOSEC) 40 MG capsule TAKE 1 CAPSULE BY MOUTH TWICE DAILY 60 capsule 11  . rOPINIRole (REQUIP) 2 MG tablet TAKE 1 TABLET BY MOUTH TWICE DAILY 60 tablet 11  . pregabalin (  LYRICA) 150 MG capsule Take 1 capsule (150 mg total) by mouth 2 (two) times daily. 60 capsule 5  . Multiple Vitamins-Minerals (MULTIVITAMIN PO) Take 1 tablet by mouth daily.    Marland Kitchen albuterol (PROVENTIL HFA;VENTOLIN HFA) 108 (90 BASE) MCG/ACT inhaler Inhale 2 puffs into the lungs every 6 (six) hours as needed for wheezing or shortness of breath. (Patient not taking: Reported on 02/14/2015) 1 Inhaler 2  . albuterol (PROVENTIL) (2.5 MG/3ML) 0.083% nebulizer solution Take 3 mLs (2.5 mg total) by nebulization every 4 (four) hours as needed for wheezing or shortness of breath. (Patient not taking: Reported on 03/30/2015) 75 mL 0  . citalopram (CELEXA) 20 MG tablet TAKE 1 TABLET BY MOUTH EVERY DAY 30 tablet 6  . omeprazole (PRILOSEC) 40 MG capsule TAKE 1 CAPSULE BY MOUTH TWICE DAILY (Patient not taking: Reported on 03/30/2015) 60 capsule 0  . polyethylene glycol (MIRALAX / GLYCOLAX) packet Take 17 g by mouth daily. (Patient not taking: Reported on 03/30/2015) 14 each 0  . SPIRIVA HANDIHALER 18 MCG inhalation capsule INHALE CONTENTS OF 1 CAPSULE USING HANDIHALER EVERY DAY (Patient not taking: Reported on 03/30/2015) 90 capsule 1   No facility-administered medications prior to visit.    Allergies  Allergen Reactions  . Ciprofloxacin Other (See Comments) and Itching    Unspecified--noted in old PCP's records. Unspecified--noted in old PCP's records.  . Clindamycin/Lincomycin Rash  . Fentanyl Rash    Duragesic Patch Duragesic Patch  . Indocin [Indomethacin] Rash  . Keflex [Cephalexin] Rash  . Latex Other (See Comments) and Itching    Unspecified: noted in old PCP's  records. Unspecified: noted in old PCP's records.  . Lincomycin Hcl Rash  . Penicillins Rash  . Vancomycin Rash    ROS As per HPI  PE: Blood pressure 163/72, pulse 57, temperature 98.1 F (36.7 C), temperature source Oral, resp. rate 16, SpO2 96 %. Gen: sitting up in wheelchair, leaning to the right somewhat. VOH:YWVP: no injection, icteris, swelling, or exudate.  EOMI, PERRLA. Mouth: lips without lesion/swelling.  Oral mucosa pink and moist. Oropharynx without erythema, exudate, or swelling.  CV: RRR, no m/r/g.   LUNGS: CTA bilat, nonlabored resps, good aeration in all lung fields. EXT: hands without swelling or color abnormality. Feet: 3+ edema bilat from knees down into feet.  Feet are normal temp, cap refill 1 sec, and I cannot palpate any DP or PT pulses due to the dense tissue edema.  No skin breakdown or rash.  Mildly diminished sensation to fine touch on plantar surfaces bilat. R ankle in fixed eversion position, ROM markedly limited.  L ankle with normal ROM. NEURO: minimal fine tremor in bilat hands/fingers with arms held outstretched in front of her.  No head tremor. Minimal tremor at the end of her reach on FNF testing (bilat).  No tremor with hands resting on her thighs. Remainder of neuro exam nonfocal.  LABS:  Lab Results  Component Value Date   HGBA1C 5.6 01/12/2015     Chemistry      Component Value Date/Time   NA 139 01/12/2015 1547   K 4.5 01/12/2015 1547   CL 96 01/12/2015 1547   CO2 38* 01/12/2015 1547   BUN 33* 01/12/2015 1547   CREATININE 0.80 01/12/2015 1547   CREATININE 0.91 08/20/2014 1559      Component Value Date/Time   CALCIUM 9.1 01/12/2015 1547   ALKPHOS 93 01/12/2015 1547   AST 18 01/12/2015 1547   ALT 14 01/12/2015 1547   BILITOT 0.5  01/12/2015 1547     Lab Results  Component Value Date   WBC 3.0* 01/12/2015   HGB 9.8* 01/12/2015   HCT 29.7* 01/12/2015   MCV 77.2* 01/12/2015   PLT 104.0* 01/12/2015   Lab Results  Component  Value Date   TSH 0.742 09/29/2014   Lab Results  Component Value Date   IRON 54 04/20/2014   TIBC 245* 04/20/2014   FERRITIN 158 04/20/2014    IMPRESSION AND PLAN:  1) Essential tremor: recent worsening.  No clear reason why. Discussed relatively benign nature of this disorder and I recommended against trial of med since there is high chance of adverse med interaction in her case.   2) Diabetic peripheral neuropathy: seems to be affecting fingers as well as toes/feet. Her circulatory insufficiency (lymph>venous insufficiency) is severe but stable. Increase lyrica to 171m tid (instead of bid).  Check vit B12 today.  3) Opioid induced constipation: no help from miralax and senna/colace. Start trial of linzess 145 mcg qd.  Increase to 2 caps qd if no improvement in about 10d.  4) Hypothyroidism: recheck TSH today.  5) Chronic hypercarbic, hypoxic resp failure; obesity hypoventilation syndrome + chronic diast CHF. Continue all current meds and oxygen.  Pulm has determined that COPD is not present to any significant extent so she is off all inhaled meds lately.   CMET today.  6) Pancytopenia, with low MCV.  Platelets and Hb have been consistently low, but WBC normal until last check 12/2014.   iFOB neg 10/2014.   Has been on FeSO4 3283mbid. REcheck CBC w/diff today.  An After Visit Summary was printed and given to the patient.  FOLLOW UP: Return in about 3 months (around 06/28/2015) for routine chronic illness f/u.

## 2015-03-30 NOTE — Telephone Encounter (Signed)
Rx faxed

## 2015-03-30 NOTE — Telephone Encounter (Signed)
RF request for citalopram LOV: 02/04/15 Next ov: 03/30/15 Last written: 08/25/14 #30 w/ 6RF

## 2015-03-30 NOTE — Progress Notes (Signed)
Pre visit review using our clinic review tool, if applicable. No additional management support is needed unless otherwise documented below in the visit note. 

## 2015-03-30 NOTE — Patient Instructions (Signed)
Take one linzess per day for about 10d and if not noticing significant improvement in constipation increase to 2 caps per day.

## 2015-03-31 LAB — COMPREHENSIVE METABOLIC PANEL
ALT: 12 U/L (ref 0–35)
AST: 17 U/L (ref 0–37)
Albumin: 3.6 g/dL (ref 3.5–5.2)
Alkaline Phosphatase: 97 U/L (ref 39–117)
BILIRUBIN TOTAL: 0.5 mg/dL (ref 0.2–1.2)
BUN: 29 mg/dL — ABNORMAL HIGH (ref 6–23)
CALCIUM: 9.1 mg/dL (ref 8.4–10.5)
CHLORIDE: 96 meq/L (ref 96–112)
CO2: 37 meq/L — AB (ref 19–32)
Creatinine, Ser: 0.89 mg/dL (ref 0.40–1.20)
GFR: 66.5 mL/min (ref >60.00)
Glucose, Bld: 104 mg/dL — ABNORMAL HIGH (ref 70–99)
POTASSIUM: 4.3 meq/L (ref 3.5–5.1)
Sodium: 139 mEq/L (ref 135–145)
Total Protein: 6.5 g/dL (ref 6.0–8.3)

## 2015-03-31 LAB — CBC WITH DIFFERENTIAL/PLATELET
BASOS PCT: 0.1 % (ref 0.0–3.0)
Basophils Absolute: 0 10*3/uL (ref 0.0–0.1)
Eosinophils Absolute: 0.1 10*3/uL (ref 0.0–0.7)
Eosinophils Relative: 1.3 % (ref 0.0–5.0)
HCT: 32.2 % — ABNORMAL LOW (ref 36.0–46.0)
Hemoglobin: 10.4 g/dL — ABNORMAL LOW (ref 12.0–15.0)
LYMPHS PCT: 17.7 % (ref 12.0–46.0)
Lymphs Abs: 1.1 10*3/uL (ref 0.7–4.0)
MCHC: 32.2 g/dL (ref 30.0–36.0)
MCV: 78.9 fl (ref 78.0–100.0)
MONOS PCT: 3.6 % (ref 3.0–12.0)
Monocytes Absolute: 0.2 10*3/uL (ref 0.1–1.0)
NEUTROS ABS: 4.8 10*3/uL (ref 1.4–7.7)
NEUTROS PCT: 77.3 % — AB (ref 43.0–77.0)
PLATELETS: 112 10*3/uL — AB (ref 150.0–400.0)
RBC: 4.08 Mil/uL (ref 3.87–5.11)
RDW: 15.7 % — AB (ref 11.5–15.5)
WBC: 6.2 10*3/uL (ref 4.0–10.5)

## 2015-03-31 LAB — TSH: TSH: 1.27 u[IU]/mL (ref 0.35–4.50)

## 2015-03-31 LAB — VITAMIN B12: Vitamin B-12: 1214 pg/mL — ABNORMAL HIGH (ref 211–911)

## 2015-04-15 ENCOUNTER — Encounter: Payer: Self-pay | Admitting: Family Medicine

## 2015-04-26 ENCOUNTER — Encounter (INDEPENDENT_AMBULATORY_CARE_PROVIDER_SITE_OTHER): Payer: Medicare Other

## 2015-04-26 ENCOUNTER — Ambulatory Visit (INDEPENDENT_AMBULATORY_CARE_PROVIDER_SITE_OTHER): Payer: Medicare Other | Admitting: Nurse Practitioner

## 2015-04-26 ENCOUNTER — Encounter: Payer: Self-pay | Admitting: Nurse Practitioner

## 2015-04-26 VITALS — BP 150/56 | HR 60 | Ht 64.0 in

## 2015-04-26 DIAGNOSIS — R002 Palpitations: Secondary | ICD-10-CM

## 2015-04-26 DIAGNOSIS — N289 Disorder of kidney and ureter, unspecified: Secondary | ICD-10-CM

## 2015-04-26 DIAGNOSIS — I10 Essential (primary) hypertension: Secondary | ICD-10-CM

## 2015-04-26 DIAGNOSIS — I5032 Chronic diastolic (congestive) heart failure: Secondary | ICD-10-CM

## 2015-04-26 LAB — CBC
HCT: 28.8 % — ABNORMAL LOW (ref 36.0–46.0)
Hemoglobin: 9.5 g/dL — ABNORMAL LOW (ref 12.0–15.0)
MCH: 25.5 pg — ABNORMAL LOW (ref 26.0–34.0)
MCHC: 33 g/dL (ref 30.0–36.0)
MCV: 77.2 fL — ABNORMAL LOW (ref 78.0–100.0)
MPV: 9.7 fL (ref 8.6–12.4)
Platelets: 106 10*3/uL — ABNORMAL LOW (ref 150–400)
RBC: 3.73 MIL/uL — ABNORMAL LOW (ref 3.87–5.11)
RDW: 15.2 % (ref 11.5–15.5)
WBC: 7.1 10*3/uL (ref 4.0–10.5)

## 2015-04-26 LAB — BASIC METABOLIC PANEL
BUN: 38 mg/dL — ABNORMAL HIGH (ref 7–25)
CO2: 36 mmol/L — ABNORMAL HIGH (ref 20–31)
Calcium: 8.8 mg/dL (ref 8.6–10.4)
Chloride: 96 mmol/L — ABNORMAL LOW (ref 98–110)
Creat: 0.82 mg/dL (ref 0.60–0.93)
Glucose, Bld: 59 mg/dL — ABNORMAL LOW (ref 65–99)
Potassium: 4.4 mmol/L (ref 3.5–5.3)
Sodium: 139 mmol/L (ref 135–146)

## 2015-04-26 MED ORDER — FUROSEMIDE 40 MG PO TABS: 60.0000 mg | ORAL_TABLET | Freq: Two times a day (BID) | ORAL | Status: DC

## 2015-04-26 NOTE — Progress Notes (Signed)
CARDIOLOGY OFFICE NOTE  Date:  04/26/2015    Brooke Mccullough Date of Birth: 1945-06-09 Medical Record #[id number]  PCP:  Tammi Sou, MD  Cardiologist:  Stanford Breed  Chief Complaint  Patient presents with  . Congestive Heart Failure    6 month check - seen for Dr. Stanford Breed    History of Present Illness: Brooke Mccullough is a 70 y.o. female who presents today for a 6 month check. Seen for Dr. Stanford Breed.   She has diastolic CHF, venous insufficiency, HTN, COPD, anemia, type 2 DM, obesity and chronic pain syndrome. Holter 5/15 showed sinus; echo 12/15 showed normal LV function, mild LVH, grade 1 diastolic dysfunction, mild MR, mild to moderate LAE. Lower ext dopplers 6/16 showed no DVT. CTA 6/16 showed no pulmonary embolus; thyroid nodule and ultrasound recommended - does not look like this was done.   Last seen here back in July - felt to be stable from cardiac standpoint.   Comes back today. Here alone - she is in a motorized wheelchair. Very limited mobility. She denies any chest pain. She feels like her breathing has gotten worse. She remains on oxygen. Chronic edema - says this is at her baseline but that her PCP was considering increasing her Lasix. She notes some "flutters" in her chest - does not happen every day. Will make her lightheaded. No syncope reported. Very sedentary. She is to see endocrine about her thyroid and has plans to see Dr. Aleda Grana at the [facility] for consideration of compression therapy. She is not able to wear shoes.    Past Medical History  Diagnosis Date  . COPD (chronic obstructive pulmonary disease) (HCC)     nocturnal oxygen, plus daytime use with activity  . IBS (irritable bowel syndrome)   . Edema     venous insuff + cor pulmonale  . Gallstones     asymptomatic per pt (as of 09/30/2013); mild chronic elevation of Alk phos (remainder of hepatic panel wnl).  . Hernia of abdominal wall   . Hypertension   . Hyperlipidemia   . Obesity  hypoventilation syndrome ([place])     "3-4 L depending on how I feel; 24/7" (09/29/2014)  . Adrenal benign tumor   . Anemia 2015    Baseline Hb 11, normocytic.  iFOB neg 10/2014.  Marland Kitchen GERD (gastroesophageal reflux disease)   . History of duodenal ulcer   . TIA (transient ischemic attack) 2012; 2014    left facial and left arm numbness since  . Chronic lower back pain     "L5-S1" (09/29/2014)  . Gout   . Anxiety and depression   . Tremor     "upper extremities; last 6-7 months" (09/28/2013)  . Multinodular goiter summer 2015    FNA 35/5/73 "Follicular lesion of undetermined significance": a follicular lesion/neoplasm can not be ruled out--Dr. Cruzita Lederer recommended repeat bx in 6-12 mo  . Chronic diastolic CHF (congestive heart failure) ([place]) 2015  . History of pneumonia "once"  . Closed right trimalleolar fracture     Surgery/fixation hardware placed.  Marland Kitchen Unspecified hypothyroidism 12/21/2013  . Type II diabetes mellitus (HCC)     hx of good control per pt report + HbA1c <7% June 2015.  Marland Kitchen History of hiatal hernia   . Migraine     "@ least monthly" (09/29/2014)  . Degenerative disc disease     Cervical and lumbar  . Osteoarthritis (arthritis due to wear and tear of joints)     "back; knees; elbows; left  ankle" (09/29/2014)  . Fibromyalgia     Dx'd 1993.  This has impaired her significantly.  . Cancer of right breast ([place]) 2008    Lumpectomy + radiation+aromatase inhibitor (femara).  Regular f/u and mammos normal.  . Obesity, Class II, BMI 35-39.9   . Thrombocytopenia (HCC)     Chronic, mild.  ? Letrozole effect ?--consider getting pt f/u with oncology locally to see if letrozole can be adjusted or stopped.    Past Surgical History  Procedure Laterality Date  . Lumbar laminectomy  1991; 1996  . Orif ankle fracture bimalleolar Left 08/2011  . Breast biopsy Right 2008  . Breast lumpectomy Right 2008  . Cataract extraction w/ intraocular lens implant Right 08/2013  . Cardiac catheterization   2000's X 2  . Combined hysterectomy vaginal / oophorectomy / a&p repair / sacrospinous ligament suspension  03/1989  . Orif ankle fracture Right 02/11/2014    Procedure: OPEN REDUCTION INTERNAL FIXATION (ORIF) ANKLE FRACTURE;  Surgeon: Alta Corning, MD;  Location: [facility];  Service: Orthopedics;  Laterality: Right;  . Transthoracic echocardiogram  03/2014    Mild LVH, EF 60-65%, grade I diast dysfxn.  Mild MR.  . Holter monitor  08/2014    48H:  NORMAL  . Back surgery    . Fracture surgery    . Vaginal hysterectomy    . Biopsy thyroid  05/2014    "sample wasn't good; will wait 6 months and repeat biopsy"  . Pfts  01/21/15    Obstructive airway dz with unclear severity and superimposed restrictive dz     Medications: Current Outpatient Prescriptions  Medication Sig Dispense Refill  . ALPRAZolam (XANAX) 0.5 MG tablet TAKE 1 TABLET BY MOUTH TWICE DAILY AS NEEDED FOR ANXIETY OR SLEEP 60 tablet 5  . atorvastatin (LIPITOR) 40 MG tablet TAKE 1 TABLET BY MOUTH DAILY 30 tablet 11  . bisoprolol (ZEBETA) 5 MG tablet TAKE 1 TABLET(5 MG) BY MOUTH DAILY 30 tablet 11  . cetirizine (ZYRTEC) 10 MG tablet Take 10 mg by mouth daily as needed for allergies.    . cholecalciferol (VITAMIN D) 1000 UNITS tablet Take 1,000 Units by mouth daily.    . citalopram (CELEXA) 20 MG tablet TAKE 1 TABLET BY MOUTH EVERY DAY 30 tablet 11  . clopidogrel (PLAVIX) 75 MG tablet Take 1 tablet (75 mg total) by mouth daily. 90 tablet 3  . ferrous sulfate 325 (65 FE) MG tablet Take 325 mg by mouth 2 (two) times daily with a meal.    . furosemide (LASIX) 40 MG tablet Take 1.5 tablets (60 mg total) by mouth 2 (two) times daily. 90 tablet 6  . Hydrocodone-Acetaminophen 2.5-325 MG TABS Take 1 tablet by mouth every 6 (six) hours as needed.    . insulin aspart (NOVOLOG) 100 UNIT/ML injection 3 U SQ qAC (Patient taking differently: Inject 3 Units into the skin 3 (three) times daily with meals. ) 10 mL   . Insulin Glargine (LANTUS) 100  UNIT/ML Solostar Pen Inject 40 Units into the skin 2 (two) times daily. 3 pen 3  . letrozole (FEMARA) 2.5 MG tablet TAKE 1 TABLET BY MOUTH EVERY DAY 90 tablet 3  . levothyroxine (SYNTHROID, LEVOTHROID) 100 MCG tablet TAKE 1 TABLET BY MOUTH EVERY DAY BEFORE BREAKFAST 90 tablet 3  . Linaclotide (LINZESS) 145 MCG CAPS capsule Take 1 capsule (145 mcg total) by mouth daily. 30 capsule 6  . Magnesium Oxide 500 MG (LAX) TABS Take 1,000 mg by mouth daily.     Marland Kitchen  meclizine (ANTIVERT) 12.5 MG tablet TAKE 1 TO 2 TABLETS BY MOUTH EVERY 6 HOURS AS NEEDED FOR NAUSEA OR DIZZINESS 60 tablet 6  . morphine (MS CONTIN) 30 MG 12 hr tablet Take 30 mg by mouth every 12 (twelve) hours.    . Multiple Vitamins-Minerals (MULTIVITAMIN PO) Take 1 tablet by mouth daily.    . NON FORMULARY Place 3-3.5 L into the nose continuous. Copd/emphesema    . omeprazole (PRILOSEC) 40 MG capsule TAKE 1 CAPSULE BY MOUTH TWICE DAILY 60 capsule 11  . pregabalin (LYRICA) 150 MG capsule 1 tab po tid 90 capsule 5  . rOPINIRole (REQUIP) 2 MG tablet TAKE 1 TABLET BY MOUTH TWICE DAILY 60 tablet 11   No current facility-administered medications for this visit.    Allergies: Allergies  Allergen Reactions  . Ciprofloxacin Other (See Comments) and Itching    Unspecified--noted in old PCP's records. Unspecified--noted in old PCP's records.  . Clindamycin/Lincomycin Rash  . Fentanyl Rash    Duragesic Patch Duragesic Patch  . Indocin [Indomethacin] Rash  . Keflex [Cephalexin] Rash  . Latex Other (See Comments) and Itching    Unspecified: noted in old PCP's records. Unspecified: noted in old PCP's records.  . Lincomycin Hcl Rash  . Penicillins Rash  . Vancomycin Rash    Social History: The patient  reports that she quit smoking about 19 months ago. Her smoking use included Cigarettes. She has a 36 pack-year smoking history. She has never used smokeless tobacco. She reports that she does not drink alcohol or use illicit drugs.   Family  History: The patient's family history includes Arthritis in her father, mother, and paternal grandfather; Breast cancer in her mother; Cancer in her father; Diabetes in her paternal grandmother; Heart disease in her father, maternal grandmother, and paternal grandmother; Hyperlipidemia in her father, maternal grandmother, and mother; Hypertension in her mother and paternal grandmother; Mental illness in her mother; Stroke in her mother.   Review of Systems: Please see the history of present illness.   Otherwise, the review of systems is positive for none.   All other systems are reviewed and negative.   Physical Exam: VS:  BP 150/56 mmHg  Pulse 60  Ht 5' 4"  (1.626 m) .  BMI There is no weight on file to calculate BMI.  Wt Readings from Last 3 Encounters:  02/14/15 221 lb (100.245 kg)  11/12/14 223 lb (101.152 kg)  11/03/14 224 lb (101.606 kg)    General: Pleasant. She is morbidly obese. Not able to stand to weigh. Seems a little sedated but in no acute distress.  HEENT: Normal. Neck: Supple, thick.   Cardiac: Regular rate and rhythm. No murmurs, rubs, or gallops. Massive edema of the lower legs/feet.  Respiratory:  Lungs are clear to auscultation bilaterally with normal work of breathing.  GI: Soft and nontender.  MS: No deformity or atrophy. Gait and ROM intact. Skin: Warm and dry. Color is normal.  Neuro:  Strength and sensation are intact and no gross focal deficits noted.  Psych: Alert, appropriate and with normal affect.   LABORATORY DATA:  EKG:  EKG is ordered today. This shows sinus bradycardia - rate of 58.  Lab Results  Component Value Date   WBC 6.2 03/30/2015   HGB 10.4* 03/30/2015   HCT 32.2* 03/30/2015   PLT 112.0* 03/30/2015   GLUCOSE 104* 03/30/2015   CHOL 144 04/24/2014   TRIG 213* 04/24/2014   HDL 33* 04/24/2014   LDLCALC 68 04/24/2014   ALT  12 03/30/2015   AST 17 03/30/2015   NA 139 03/30/2015   K 4.3 03/30/2015   CL 96 03/30/2015   CREATININE 0.89  03/30/2015   BUN 29* 03/30/2015   CO2 37* 03/30/2015   TSH 1.27 03/30/2015   INR 1.07 10/01/2014   HGBA1C 5.6 01/12/2015    BNP (last 3 results)  Recent Labs  06/10/14 1907 08/02/14 1647 09/29/14 1155  BNP 69.6 56.8 48.0    ProBNP (last 3 results) No results for input(s): PROBNP in the last 8760 hours.   Other Studies Reviewed Today:  Echo Study Conclusions from 2015  - Left ventricle: E/e&'>14.8 suggestive of elevated LV filling pressures. There was mild concentric hypertrophy. Systolic function was normal. The estimated ejection fraction was in the range of 60% to 65%. There was an increased relative contribution of atrial contraction to ventricular filling. Doppler parameters are consistent with abnormal left ventricular relaxation (grade 1 diastolic dysfunction). - Aortic valve: Mildly calcified annulus. Mildly calcified leaflets. - Mitral valve: Calcified annulus. There was mild regurgitation. - Left atrium: The atrium was mildly to moderately dilated.   Assessment/Plan: 1. Chronic diastolic HF - most likely multifactorial - not able to weigh. Recheck labs today. Increase Lasix to 60 mg BID.   2. Cor pulmonale/COPD - oxygen sat was only 77% on her 3 liters of pulse therapy - changed to our tank at 2 liters continuously and got her sat up to 94%. Her pulse therapy does not seem to be working - she was sent out on continuous at 2 liters on her tank.   3. Morbid obesity - do not see this changing - she is not able to exercise.   4. Chronic dyspnea - most likely multifactorial. Will increase her lasix.   5. Heart palpitations - negative Holter 8 months ago - now with more "flutters" and is lightheaded - would worry about PAF. Event monitor placed today.   6. Thyroid nodule - seeing Endocrine later this month  7. Chronic edema - venous insufficiency - tells me her swelling is at her baseline - she is seeing Dr. Aleda Grana later this month for  possible compression therapy.   Overall prognosis is tenuous at best.   Current medicines are reviewed with the patient today.  The patient does not have concerns regarding medicines other than what has been noted above.  The following changes have been made:  See above.  Labs/ tests ordered today include:    Orders Placed This Encounter  Procedures  . Basic metabolic panel  . Brain natriuretic peptide  . CBC  . Cardiac event monitor  . EKG 12-Lead     Disposition:   FU with Dr. Stanford Breed in 6 weeks.   Patient is agreeable to this plan and will call if any problems develop in the interim.   Signed: Burtis Junes, RN, ANP-C 04/26/2015 3:10 PM  [facility] [address] [place], [place]  [address] Phone: [phone] Fax: [phone]

## 2015-04-26 NOTE — Patient Instructions (Addendum)
We will be checking the following labs today - BMET, CBC and BNP   Medication Instructions:    Continue with your current medicines. BUT  I am increasing the Lasix to 60 mg twice a day - this will be a pill and a half taken twice a day    Testing/Procedures To Be Arranged:  Event monitor  Follow-Up:   See Dr. Stanford Breed in about 5 to 6 weeks     Other Special Instructions:   N/A    If you need a refill on your cardiac medications before your next appointment, please call your pharmacy.   Call the [place] office at [phone] if you have any questions, problems or concerns.

## 2015-04-27 ENCOUNTER — Other Ambulatory Visit: Payer: Self-pay | Admitting: *Deleted

## 2015-04-27 DIAGNOSIS — I5032 Chronic diastolic (congestive) heart failure: Secondary | ICD-10-CM

## 2015-04-27 LAB — BRAIN NATRIURETIC PEPTIDE: Brain Natriuretic Peptide: 111.4 pg/mL — ABNORMAL HIGH (ref 0.0–100.0)

## 2015-05-09 ENCOUNTER — Emergency Department (HOSPITAL_COMMUNITY)
Admission: EM | Admit: 2015-05-09 | Discharge: 2015-05-09 | Discharge status: Against Medical Advice | Payer: Medicare Other | Source: Home / Self Care

## 2015-05-09 ENCOUNTER — Encounter (HOSPITAL_COMMUNITY): Payer: Self-pay | Admitting: Family Medicine

## 2015-05-09 DIAGNOSIS — E669 Obesity, unspecified: Secondary | ICD-10-CM

## 2015-05-09 DIAGNOSIS — I5033 Acute on chronic diastolic (congestive) heart failure: Secondary | ICD-10-CM | POA: Diagnosis not present

## 2015-05-09 DIAGNOSIS — K5900 Constipation, unspecified: Secondary | ICD-10-CM | POA: Diagnosis not present

## 2015-05-09 DIAGNOSIS — G8929 Other chronic pain: Secondary | ICD-10-CM | POA: Insufficient documentation

## 2015-05-09 DIAGNOSIS — Y998 Other external cause status: Secondary | ICD-10-CM

## 2015-05-09 DIAGNOSIS — Y9389 Activity, other specified: Secondary | ICD-10-CM | POA: Insufficient documentation

## 2015-05-09 DIAGNOSIS — E119 Type 2 diabetes mellitus without complications: Secondary | ICD-10-CM

## 2015-05-09 DIAGNOSIS — I5032 Chronic diastolic (congestive) heart failure: Secondary | ICD-10-CM | POA: Insufficient documentation

## 2015-05-09 DIAGNOSIS — J449 Chronic obstructive pulmonary disease, unspecified: Secondary | ICD-10-CM

## 2015-05-09 DIAGNOSIS — I10 Essential (primary) hypertension: Secondary | ICD-10-CM | POA: Insufficient documentation

## 2015-05-09 DIAGNOSIS — W01190A Fall on same level from slipping, tripping and stumbling with subsequent striking against furniture, initial encounter: Secondary | ICD-10-CM

## 2015-05-09 DIAGNOSIS — Z6839 Body mass index (BMI) 39.0-39.9, adult: Secondary | ICD-10-CM

## 2015-05-09 DIAGNOSIS — S0990XA Unspecified injury of head, initial encounter: Secondary | ICD-10-CM | POA: Insufficient documentation

## 2015-05-09 DIAGNOSIS — Y9289 Other specified places as the place of occurrence of the external cause: Secondary | ICD-10-CM | POA: Insufficient documentation

## 2015-05-09 NOTE — ED Notes (Addendum)
Patient is leaving and says she will wait until her doctor's appt tomorrow. IV placed by EMS discontinued.

## 2015-05-09 NOTE — ED Notes (Signed)
Pt here for fall during transition to bedside commode. sts hit back right side of head on dresser. sts some swelling to head. No bleeding. Pt is on plavix. Denies LOC. haedache vision. PERRLA. sts some dizzy and nausea with BP changes. 214 sys initial. 18 LAC.

## 2015-05-10 ENCOUNTER — Emergency Department (HOSPITAL_COMMUNITY): Payer: Medicare Other

## 2015-05-10 ENCOUNTER — Encounter (HOSPITAL_COMMUNITY): Payer: Self-pay | Admitting: *Deleted

## 2015-05-10 ENCOUNTER — Inpatient Hospital Stay (HOSPITAL_COMMUNITY)
Admission: EM | Admit: 2015-05-10 | Discharge: 2015-05-16 | DRG: 291 | Disposition: A | Discharge status: Skilled Nursing Facility | Payer: Medicare Other | Attending: Internal Medicine | Admitting: Internal Medicine

## 2015-05-10 ENCOUNTER — Inpatient Hospital Stay (HOSPITAL_COMMUNITY): Payer: Medicare Other

## 2015-05-10 DIAGNOSIS — Z888 Allergy status to other drugs, medicaments and biological substances status: Secondary | ICD-10-CM

## 2015-05-10 DIAGNOSIS — R809 Proteinuria, unspecified: Secondary | ICD-10-CM | POA: Diagnosis present

## 2015-05-10 DIAGNOSIS — M109 Gout, unspecified: Secondary | ICD-10-CM | POA: Diagnosis present

## 2015-05-10 DIAGNOSIS — I509 Heart failure, unspecified: Secondary | ICD-10-CM

## 2015-05-10 DIAGNOSIS — E662 Morbid (severe) obesity with alveolar hypoventilation: Secondary | ICD-10-CM | POA: Diagnosis present

## 2015-05-10 DIAGNOSIS — Z87891 Personal history of nicotine dependence: Secondary | ICD-10-CM

## 2015-05-10 DIAGNOSIS — G894 Chronic pain syndrome: Secondary | ICD-10-CM | POA: Diagnosis present

## 2015-05-10 DIAGNOSIS — J811 Chronic pulmonary edema: Secondary | ICD-10-CM | POA: Diagnosis present

## 2015-05-10 DIAGNOSIS — T402X5A Adverse effect of other opioids, initial encounter: Secondary | ICD-10-CM | POA: Diagnosis present

## 2015-05-10 DIAGNOSIS — J9621 Acute and chronic respiratory failure with hypoxia: Secondary | ICD-10-CM | POA: Diagnosis not present

## 2015-05-10 DIAGNOSIS — Z8673 Personal history of transient ischemic attack (TIA), and cerebral infarction without residual deficits: Secondary | ICD-10-CM

## 2015-05-10 DIAGNOSIS — J449 Chronic obstructive pulmonary disease, unspecified: Secondary | ICD-10-CM | POA: Diagnosis present

## 2015-05-10 DIAGNOSIS — Z853 Personal history of malignant neoplasm of breast: Secondary | ICD-10-CM

## 2015-05-10 DIAGNOSIS — M797 Fibromyalgia: Secondary | ICD-10-CM | POA: Diagnosis present

## 2015-05-10 DIAGNOSIS — E042 Nontoxic multinodular goiter: Secondary | ICD-10-CM | POA: Diagnosis present

## 2015-05-10 DIAGNOSIS — S82851S Displaced trimalleolar fracture of right lower leg, sequela: Secondary | ICD-10-CM | POA: Diagnosis not present

## 2015-05-10 DIAGNOSIS — Z880 Allergy status to penicillin: Secondary | ICD-10-CM

## 2015-05-10 DIAGNOSIS — I5033 Acute on chronic diastolic (congestive) heart failure: Secondary | ICD-10-CM | POA: Diagnosis not present

## 2015-05-10 DIAGNOSIS — Z993 Dependence on wheelchair: Secondary | ICD-10-CM

## 2015-05-10 DIAGNOSIS — I5031 Acute diastolic (congestive) heart failure: Secondary | ICD-10-CM | POA: Diagnosis present

## 2015-05-10 DIAGNOSIS — F419 Anxiety disorder, unspecified: Secondary | ICD-10-CM | POA: Diagnosis present

## 2015-05-10 DIAGNOSIS — F329 Major depressive disorder, single episode, unspecified: Secondary | ICD-10-CM | POA: Diagnosis present

## 2015-05-10 DIAGNOSIS — R296 Repeated falls: Secondary | ICD-10-CM | POA: Diagnosis present

## 2015-05-10 DIAGNOSIS — E039 Hypothyroidism, unspecified: Secondary | ICD-10-CM | POA: Diagnosis present

## 2015-05-10 DIAGNOSIS — K589 Irritable bowel syndrome without diarrhea: Secondary | ICD-10-CM | POA: Diagnosis present

## 2015-05-10 DIAGNOSIS — Z794 Long term (current) use of insulin: Secondary | ICD-10-CM

## 2015-05-10 DIAGNOSIS — Z6841 Body Mass Index (BMI) 40.0 and over, adult: Secondary | ICD-10-CM

## 2015-05-10 DIAGNOSIS — K5900 Constipation, unspecified: Secondary | ICD-10-CM

## 2015-05-10 DIAGNOSIS — E785 Hyperlipidemia, unspecified: Secondary | ICD-10-CM | POA: Diagnosis present

## 2015-05-10 DIAGNOSIS — Z79899 Other long term (current) drug therapy: Secondary | ICD-10-CM

## 2015-05-10 DIAGNOSIS — W050XXA Fall from non-moving wheelchair, initial encounter: Secondary | ICD-10-CM | POA: Diagnosis present

## 2015-05-10 DIAGNOSIS — K5903 Drug induced constipation: Secondary | ICD-10-CM | POA: Diagnosis present

## 2015-05-10 DIAGNOSIS — S82851A Displaced trimalleolar fracture of right lower leg, initial encounter for closed fracture: Secondary | ICD-10-CM | POA: Diagnosis present

## 2015-05-10 DIAGNOSIS — I10 Essential (primary) hypertension: Secondary | ICD-10-CM | POA: Diagnosis present

## 2015-05-10 DIAGNOSIS — Z7902 Long term (current) use of antithrombotics/antiplatelets: Secondary | ICD-10-CM

## 2015-05-10 DIAGNOSIS — Z885 Allergy status to narcotic agent status: Secondary | ICD-10-CM

## 2015-05-10 DIAGNOSIS — Z881 Allergy status to other antibiotic agents status: Secondary | ICD-10-CM

## 2015-05-10 DIAGNOSIS — E1129 Type 2 diabetes mellitus with other diabetic kidney complication: Secondary | ICD-10-CM | POA: Diagnosis present

## 2015-05-10 DIAGNOSIS — I2781 Cor pulmonale (chronic): Secondary | ICD-10-CM | POA: Diagnosis present

## 2015-05-10 DIAGNOSIS — K219 Gastro-esophageal reflux disease without esophagitis: Secondary | ICD-10-CM | POA: Diagnosis present

## 2015-05-10 DIAGNOSIS — Z91040 Latex allergy status: Secondary | ICD-10-CM

## 2015-05-10 DIAGNOSIS — D696 Thrombocytopenia, unspecified: Secondary | ICD-10-CM | POA: Diagnosis present

## 2015-05-10 DIAGNOSIS — E119 Type 2 diabetes mellitus without complications: Secondary | ICD-10-CM

## 2015-05-10 LAB — URINALYSIS, ROUTINE W REFLEX MICROSCOPIC
BILIRUBIN URINE: NEGATIVE
Glucose, UA: NEGATIVE mg/dL
KETONES UR: NEGATIVE mg/dL
NITRITE: NEGATIVE
PH: 5.5 (ref 5.0–8.0)
Specific Gravity, Urine: 1.011 (ref 1.005–1.030)

## 2015-05-10 LAB — BASIC METABOLIC PANEL
ANION GAP: 9 (ref 5–15)
BUN: 44 mg/dL — ABNORMAL HIGH (ref 6–20)
CALCIUM: 8.7 mg/dL — AB (ref 8.9–10.3)
CO2: 33 mmol/L — AB (ref 22–32)
CREATININE: 0.8 mg/dL (ref 0.44–1.00)
Chloride: 93 mmol/L — ABNORMAL LOW (ref 101–111)
Glucose, Bld: 92 mg/dL (ref 65–99)
Potassium: 4.3 mmol/L (ref 3.5–5.1)
SODIUM: 135 mmol/L (ref 135–145)

## 2015-05-10 LAB — CBC WITH DIFFERENTIAL/PLATELET
BASOS ABS: 0 10*3/uL (ref 0.0–0.1)
BASOS PCT: 0 %
EOS ABS: 0.1 10*3/uL (ref 0.0–0.7)
EOS PCT: 1 %
HCT: 31.8 % — ABNORMAL LOW (ref 36.0–46.0)
Hemoglobin: 10 g/dL — ABNORMAL LOW (ref 12.0–15.0)
Lymphocytes Relative: 8 %
Lymphs Abs: 0.7 10*3/uL (ref 0.7–4.0)
MCH: 25.6 pg — ABNORMAL LOW (ref 26.0–34.0)
MCHC: 31.4 g/dL (ref 30.0–36.0)
MCV: 81.5 fL (ref 78.0–100.0)
MONO ABS: 0.4 10*3/uL (ref 0.1–1.0)
MONOS PCT: 4 %
Neutro Abs: 7.5 10*3/uL (ref 1.7–7.7)
Neutrophils Relative %: 87 %
PLATELETS: 99 10*3/uL — AB (ref 150–400)
RBC: 3.9 MIL/uL (ref 3.87–5.11)
RDW: 13.9 % (ref 11.5–15.5)
WBC: 8.6 10*3/uL (ref 4.0–10.5)

## 2015-05-10 LAB — URINE MICROSCOPIC-ADD ON: BACTERIA UA: NONE SEEN

## 2015-05-10 LAB — PROCALCITONIN

## 2015-05-10 LAB — CBG MONITORING, ED: Glucose-Capillary: 82 mg/dL (ref 65–99)

## 2015-05-10 LAB — BRAIN NATRIURETIC PEPTIDE: B NATRIURETIC PEPTIDE 5: 322.7 pg/mL — AB (ref 0.0–100.0)

## 2015-05-10 LAB — GLUCOSE, CAPILLARY: GLUCOSE-CAPILLARY: 125 mg/dL — AB (ref 65–99)

## 2015-05-10 MED ORDER — VITAMIN D 1000 UNITS PO TABS
1000.0000 [IU] | ORAL_TABLET | Freq: Every day | ORAL | Status: DC
  Administered 2015-05-11 – 2015-05-16 (×6): 1000 [IU] via ORAL
  Filled 2015-05-10 (×9): qty 1

## 2015-05-10 MED ORDER — FERROUS SULFATE 325 (65 FE) MG PO TABS
325.0000 mg | ORAL_TABLET | Freq: Two times a day (BID) | ORAL | Status: DC
  Administered 2015-05-10 – 2015-05-11 (×2): 325 mg via ORAL
  Filled 2015-05-10 (×2): qty 1

## 2015-05-10 MED ORDER — LETROZOLE 2.5 MG PO TABS
2.5000 mg | ORAL_TABLET | Freq: Every day | ORAL | Status: DC
  Administered 2015-05-11 – 2015-05-16 (×6): 2.5 mg via ORAL
  Filled 2015-05-10 (×6): qty 1

## 2015-05-10 MED ORDER — BISOPROLOL FUMARATE 5 MG PO TABS
5.0000 mg | ORAL_TABLET | Freq: Every day | ORAL | Status: DC
  Administered 2015-05-10 – 2015-05-16 (×7): 5 mg via ORAL
  Filled 2015-05-10 (×8): qty 1

## 2015-05-10 MED ORDER — FUROSEMIDE 10 MG/ML IJ SOLN
80.0000 mg | Freq: Two times a day (BID) | INTRAMUSCULAR | Status: DC
  Administered 2015-05-10 – 2015-05-11 (×2): 80 mg via INTRAVENOUS
  Filled 2015-05-10 (×2): qty 8

## 2015-05-10 MED ORDER — ACETAMINOPHEN 325 MG PO TABS
650.0000 mg | ORAL_TABLET | ORAL | Status: DC | PRN
  Administered 2015-05-16: 650 mg via ORAL
  Filled 2015-05-10: qty 2

## 2015-05-10 MED ORDER — FUROSEMIDE 10 MG/ML IJ SOLN
40.0000 mg | Freq: Once | INTRAMUSCULAR | Status: AC
  Administered 2015-05-10: 40 mg via INTRAVENOUS
  Filled 2015-05-10: qty 4

## 2015-05-10 MED ORDER — HYDROCODONE-ACETAMINOPHEN 7.5-325 MG PO TABS
1.0000 | ORAL_TABLET | Freq: Three times a day (TID) | ORAL | Status: DC | PRN
Start: 2015-05-10 — End: 2015-05-16
  Administered 2015-05-10 – 2015-05-16 (×13): 1 via ORAL
  Filled 2015-05-10 (×13): qty 1

## 2015-05-10 MED ORDER — LEVOTHYROXINE SODIUM 100 MCG PO TABS
100.0000 ug | ORAL_TABLET | Freq: Every day | ORAL | Status: DC
  Administered 2015-05-11 – 2015-05-16 (×6): 100 ug via ORAL
  Filled 2015-05-10 (×6): qty 1

## 2015-05-10 MED ORDER — LINACLOTIDE 145 MCG PO CAPS
145.0000 ug | ORAL_CAPSULE | Freq: Every day | ORAL | Status: DC
  Administered 2015-05-11 – 2015-05-16 (×6): 145 ug via ORAL
  Filled 2015-05-10 (×6): qty 1

## 2015-05-10 MED ORDER — PANTOPRAZOLE SODIUM 40 MG PO TBEC
40.0000 mg | DELAYED_RELEASE_TABLET | Freq: Every day | ORAL | Status: DC
  Administered 2015-05-11 – 2015-05-16 (×6): 40 mg via ORAL
  Filled 2015-05-10 (×6): qty 1

## 2015-05-10 MED ORDER — MORPHINE SULFATE ER 15 MG PO TBCR
30.0000 mg | EXTENDED_RELEASE_TABLET | Freq: Two times a day (BID) | ORAL | Status: DC
  Administered 2015-05-10 – 2015-05-16 (×12): 30 mg via ORAL
  Filled 2015-05-10 (×8): qty 2
  Filled 2015-05-10: qty 1
  Filled 2015-05-10 (×2): qty 2
  Filled 2015-05-10: qty 1

## 2015-05-10 MED ORDER — ATORVASTATIN CALCIUM 40 MG PO TABS
40.0000 mg | ORAL_TABLET | Freq: Every day | ORAL | Status: DC
  Administered 2015-05-10 – 2015-05-15 (×6): 40 mg via ORAL
  Filled 2015-05-10 (×6): qty 1

## 2015-05-10 MED ORDER — CLOPIDOGREL BISULFATE 75 MG PO TABS
75.0000 mg | ORAL_TABLET | Freq: Every day | ORAL | Status: DC
  Administered 2015-05-10 – 2015-05-16 (×7): 75 mg via ORAL
  Filled 2015-05-10 (×7): qty 1

## 2015-05-10 MED ORDER — SODIUM CHLORIDE 0.9 % IJ SOLN
3.0000 mL | Freq: Two times a day (BID) | INTRAMUSCULAR | Status: DC
  Administered 2015-05-10 – 2015-05-11 (×2): 3 mL via INTRAVENOUS

## 2015-05-10 MED ORDER — SODIUM CHLORIDE 0.9 % IV BOLUS (SEPSIS)
1000.0000 mL | Freq: Once | INTRAVENOUS | Status: AC
  Administered 2015-05-10: 1000 mL via INTRAVENOUS

## 2015-05-10 MED ORDER — MAGNESIUM OXIDE 400 (241.3 MG) MG PO TABS
800.0000 mg | ORAL_TABLET | Freq: Every day | ORAL | Status: DC
  Administered 2015-05-11 – 2015-05-16 (×6): 800 mg via ORAL
  Filled 2015-05-10 (×6): qty 2

## 2015-05-10 MED ORDER — MAGNESIUM OXIDE (LAXATIVE) 500 MG PO TABS: 1000.0000 mg | ORAL_TABLET | Freq: Every day | ORAL | Status: DC

## 2015-05-10 MED ORDER — LORATADINE 10 MG PO TABS
10.0000 mg | ORAL_TABLET | Freq: Every day | ORAL | Status: DC
  Administered 2015-05-11 – 2015-05-16 (×6): 10 mg via ORAL
  Filled 2015-05-10 (×6): qty 1

## 2015-05-10 MED ORDER — ONDANSETRON HCL 4 MG/2ML IJ SOLN
4.0000 mg | Freq: Four times a day (QID) | INTRAMUSCULAR | Status: DC | PRN
  Administered 2015-05-15 – 2015-05-16 (×2): 4 mg via INTRAVENOUS
  Filled 2015-05-10 (×2): qty 2

## 2015-05-10 MED ORDER — ROPINIROLE HCL 1 MG PO TABS
2.0000 mg | ORAL_TABLET | Freq: Two times a day (BID) | ORAL | Status: DC
  Administered 2015-05-10 – 2015-05-16 (×12): 2 mg via ORAL
  Filled 2015-05-10 (×15): qty 2

## 2015-05-10 MED ORDER — SODIUM CHLORIDE 0.9 % IJ SOLN: 3.0000 mL | INTRAMUSCULAR | Status: DC | PRN

## 2015-05-10 MED ORDER — SODIUM CHLORIDE 0.9 % IV SOLN: 250.0000 mL | INTRAVENOUS | Status: DC | PRN

## 2015-05-10 MED ORDER — PREGABALIN 75 MG PO CAPS
150.0000 mg | ORAL_CAPSULE | Freq: Three times a day (TID) | ORAL | Status: DC
  Administered 2015-05-10 – 2015-05-16 (×17): 150 mg via ORAL
  Filled 2015-05-10: qty 3
  Filled 2015-05-10 (×2): qty 2
  Filled 2015-05-10: qty 3
  Filled 2015-05-10: qty 2
  Filled 2015-05-10 (×4): qty 3
  Filled 2015-05-10: qty 2
  Filled 2015-05-10 (×4): qty 3
  Filled 2015-05-10: qty 2
  Filled 2015-05-10: qty 3
  Filled 2015-05-10: qty 2

## 2015-05-10 MED ORDER — INSULIN GLARGINE 100 UNIT/ML ~~LOC~~ SOLN
40.0000 [IU] | Freq: Two times a day (BID) | SUBCUTANEOUS | Status: DC
Start: 2015-05-10 — End: 2015-05-11
  Administered 2015-05-10 – 2015-05-11 (×2): 40 [IU] via SUBCUTANEOUS
  Filled 2015-05-10 (×3): qty 0.4

## 2015-05-10 MED ORDER — INSULIN ASPART 100 UNIT/ML ~~LOC~~ SOLN: 0.0000 [IU] | Freq: Every day | SUBCUTANEOUS | Status: DC

## 2015-05-10 MED ORDER — ALPRAZOLAM 0.5 MG PO TABS
0.5000 mg | ORAL_TABLET | Freq: Two times a day (BID) | ORAL | Status: DC | PRN
  Administered 2015-05-10 – 2015-05-16 (×10): 0.5 mg via ORAL
  Filled 2015-05-10 (×11): qty 1

## 2015-05-10 MED ORDER — INSULIN ASPART 100 UNIT/ML ~~LOC~~ SOLN
0.0000 [IU] | Freq: Three times a day (TID) | SUBCUTANEOUS | Status: DC
  Administered 2015-05-11 – 2015-05-14 (×2): 1 [IU] via SUBCUTANEOUS
  Administered 2015-05-15: 2 [IU] via SUBCUTANEOUS
  Administered 2015-05-16: 1 [IU] via SUBCUTANEOUS

## 2015-05-10 MED ORDER — INSULIN GLARGINE 100 UNIT/ML SOLOSTAR PEN: 40.0000 [IU] | PEN_INJECTOR | Freq: Two times a day (BID) | SUBCUTANEOUS | Status: DC

## 2015-05-10 MED ORDER — MECLIZINE HCL 12.5 MG PO TABS
12.5000 mg | ORAL_TABLET | Freq: Three times a day (TID) | ORAL | Status: DC | PRN
  Filled 2015-05-10: qty 1

## 2015-05-10 MED ORDER — PREGABALIN 75 MG PO CAPS: 150.0000 mg | ORAL_CAPSULE | Freq: Three times a day (TID) | ORAL | Status: DC

## 2015-05-10 MED ORDER — CITALOPRAM HYDROBROMIDE 20 MG PO TABS
20.0000 mg | ORAL_TABLET | Freq: Every day | ORAL | Status: DC
  Administered 2015-05-11 – 2015-05-16 (×6): 20 mg via ORAL
  Filled 2015-05-10 (×6): qty 1

## 2015-05-10 NOTE — H&P (Signed)
[facility] History and Physical                                                                                    Brooke Mccullough, is a 70 y.o. female  MRN: [id number]   DOB - [birth year]  Admit Date - 05/10/2015  Outpatient Primary MD for the patient is Tammi Sou, MD  Referring MD: [facility] / ER  PMH: Past Medical History  Diagnosis Date  . COPD (chronic obstructive pulmonary disease) (HCC)     nocturnal oxygen, plus daytime use with activity  . IBS (irritable bowel syndrome)   . Edema     venous insuff + cor pulmonale  . Gallstones     asymptomatic per pt (as of 09/30/2013); mild chronic elevation of Alk phos (remainder of hepatic panel wnl).  . Hernia of abdominal wall   . Hypertension   . Hyperlipidemia   . Obesity hypoventilation syndrome ([place])     "3-4 L depending on how I feel; 24/7" (09/29/2014)  . Adrenal benign tumor   . Anemia 2015    Baseline Hb 11, normocytic.  iFOB neg 10/2014.  Marland Kitchen GERD (gastroesophageal reflux disease)   . History of duodenal ulcer   . TIA (transient ischemic attack) 2012; 2014    left facial and left arm numbness since  . Chronic lower back pain     "L5-S1" (09/29/2014)  . Gout   . Anxiety and depression   . Tremor     "upper extremities; last 6-7 months" (09/28/2013)  . Multinodular goiter summer 2015    FNA 85/0/27 "Follicular lesion of undetermined significance": a follicular lesion/neoplasm can not be ruled out--Dr. Cruzita Lederer recommended repeat bx in 6-12 mo  . Chronic diastolic CHF (congestive heart failure) ([place]) 2015  . History of pneumonia "once"  . Closed right trimalleolar fracture     Surgery/fixation hardware placed.  Marland Kitchen Unspecified hypothyroidism 12/21/2013  . Type II diabetes mellitus (HCC)     hx of good control per pt report + HbA1c <7% June 2015.  Marland Kitchen History of hiatal hernia   . Migraine     "@ least monthly" (09/29/2014)  . Degenerative disc disease     Cervical and lumbar  . Osteoarthritis (arthritis due to wear  and tear of joints)     "back; knees; elbows; left ankle" (09/29/2014)  . Fibromyalgia     Dx'd 1993.  This has impaired her significantly.  . Cancer of right breast ([place]) 2008    Lumpectomy + radiation+aromatase inhibitor ([facility]).  Regular f/u and mammos normal.  . Obesity, Class II, BMI 35-39.9   . Thrombocytopenia (HCC)     Chronic, mild.  ? Letrozole effect ?--consider getting pt f/u with oncology locally to see if letrozole can be adjusted or stopped.      PSH: Past Surgical History  Procedure Laterality Date  . Lumbar laminectomy  1991; 1996  . Orif ankle fracture bimalleolar Left 08/2011  . Breast biopsy Right 2008  . Breast lumpectomy Right 2008  . Cataract extraction w/ intraocular lens implant Right 08/2013  . Cardiac catheterization  2000's X 2  . Combined hysterectomy vaginal / oophorectomy / a&p  repair / sacrospinous ligament suspension  03/1989  . Orif ankle fracture Right 02/11/2014    Procedure: OPEN REDUCTION INTERNAL FIXATION (ORIF) ANKLE FRACTURE;  Surgeon: Alta Corning, MD;  Location: [facility];  Service: Orthopedics;  Laterality: Right;  . Transthoracic echocardiogram  03/2014    Mild LVH, EF 60-65%, grade I diast dysfxn.  Mild MR.  . Holter monitor  08/2014    48H:  NORMAL  . Back surgery    . Fracture surgery    . Vaginal hysterectomy    . Biopsy thyroid  05/2014    "sample wasn't good; will wait 6 months and repeat biopsy"  . Pfts  01/21/15    Obstructive airway dz with unclear severity and superimposed restrictive dz     CC:  Chief Complaint  Patient presents with  . Fall     HPI: This is a 70 year old female patient with multiple medical problems including but not limited to: Chronic respiratory failure in setting of COPD and obesity hypoventilation syndrome, chronic recurrent grade 1 diastolic heart failure, hypertension, microalbuminuria in setting of diabetes with current normal GFR, chronic pain syndrome with fibromyalgia, opioid-induced constipation,  diabetes on insulin, hypothyroidism, chronic, thrombocytopenia, chronic closed right trimalleolar fracture with splint and morbid obesity. Patient presented to the ER after becoming dizzy and lightheaded and falling out of her wheelchair and striking her head against the dresser. She has been having issues with progressive dyspnea. She was recently evaluated by her cardiologist on 1/3 at which point her Lasix was increased from 40 to 60 mg by mouth twice a day. During that evaluation patient was found to be hypoxemic with saturation 77% on 3 L pulse oxygen therapy. She was transitioned over to 2 L continuous oxygen and had improved saturations greater than 90 at that time. Patient reports that after increase in Lasix dosage she has not had any significant increase in urinary output or improvement in her symptoms. She's been having issues with palpitations recently and has an event monitor in place. Patient has noticed now she must sleep sitting up in the chair, increasing edema in her lower extremities, she has not had any constant on 2 additional symptoms such as fever or cough or abdominal pain. She did have some vomiting last night. For several days she has felt chilled. Patient reports she's had difficulty eating because of shortness of breath.  ER Evaluation and treatment: Afebrile, normotensive, pulse 70, 4 L oxygen 96% saturation CT head and cervical spine: Without acute fracture or subluxation of the spine and no evidence of acute infarction, hemorrhage, hydrocephalus or mass lesion max effect. Complete right ankle x-ray: Diffuse soft tissue swelling, stable changes of bilateral malleoli or open reduction internal fixation with hardware intact Two-view pelvic x-ray: No acute injury 2 view chest x-ray: Cardiomegaly with diffuse bilateral pulmonary infiltrates consistent with heart failure although bilateral pneumonia could not be excluded, also read as dense right lower lobe atelectasis with right  pleural effusion not excluded the patient does have a chronically elevated right hemidiaphragm XR right forearm: No acute injury XR right humerus: No acute injury EKG: Sinus rhythm with ventricular rate 70 bpm, QTC 435 ms, incomplete right bundle branch block, no acute ischemic changes BNP 323 CO2 33 BUN 44 and creatinine 0.8 Hemoglobin 10 Platelets 99,000 WBC 8600 with neutrophils 87% Urinalysis with small amount of hemoglobin, trace leukocytes, greater than 300 protein, wbc's 0-5 Normal saline 1000 mL 1 Lasix 40 mg IV 1  Review of Systems   In  addition to the HPI above,  No Fever, myalgias or other constitutional symptoms No Headache, changes with Vision or hearing, new weakness, tingling, numbness in any extremity, No problems swallowing food or Liquids, indigestion/reflux No Chest pain, Cough No Abdominal pain, nausea; no melena or hematochezia, no dark tarry stools No dysuria, hematuria or flank pain No new skin rashes, lesions, masses or bruises, No new joints pains-aches No recent weight gain or loss No polyuria, polydypsia or polyphagia,  *A full 10 point Review of Systems was done, except as stated above, all other Review of Systems were negative.  Social History Social History  Substance Use Topics  . Smoking status: Former Smoker -- 1.00 packs/day for 36 years    Types: Cigarettes    Quit date: 09/09/2013  . Smokeless tobacco: Never Used  . Alcohol Use: No    Resides at: Private residence  Lives with: Spouse  Ambulatory status: Basically immobile-wheelchair bound-able to stand and pivot to bedside commode with walker only   Family History Family History  Problem Relation Age of Onset  . Arthritis Mother   . Breast cancer Mother   . Hyperlipidemia Mother   . Stroke Mother   . Hypertension Mother   . Mental illness Mother   . Arthritis Father   . Cancer Father   . Hyperlipidemia Father   . Heart disease Father   . Heart disease Maternal  Grandmother   . Hyperlipidemia Maternal Grandmother   . Heart disease Paternal Grandmother   . Hypertension Paternal Grandmother   . Diabetes Paternal Grandmother   . Arthritis Paternal Grandfather      Prior to Admission medications   Medication Sig Start Date End Date Taking? Authorizing Provider  ALPRAZolam (XANAX) 0.5 MG tablet TAKE 1 TABLET BY MOUTH TWICE DAILY AS NEEDED FOR ANXIETY OR SLEEP 03/30/15  Yes Tammi Sou, MD  atorvastatin (LIPITOR) 40 MG tablet TAKE 1 TABLET BY MOUTH DAILY 03/21/15  Yes Tammi Sou, MD  bisoprolol (ZEBETA) 5 MG tablet TAKE 1 TABLET(5 MG) BY MOUTH DAILY 02/21/15  Yes Tammi Sou, MD  cetirizine (ZYRTEC) 10 MG tablet Take 10 mg by mouth daily as needed for allergies.   Yes Historical Provider, MD  cholecalciferol (VITAMIN D) 1000 UNITS tablet Take 1,000 Units by mouth daily.   Yes Historical Provider, MD  clopidogrel (PLAVIX) 75 MG tablet Take 1 tablet (75 mg total) by mouth daily. 06/17/14  Yes Tammi Sou, MD  ferrous sulfate 325 (65 FE) MG tablet Take 325 mg by mouth 2 (two) times daily with a meal.   Yes Historical Provider, MD  furosemide (LASIX) 40 MG tablet Take 1.5 tablets (60 mg total) by mouth 2 (two) times daily. Patient taking differently: Take 40 mg by mouth 2 (two) times daily.  04/26/15  Yes Burtis Junes, NP  HYDROcodone-acetaminophen (NORCO) 7.5-325 MG tablet Take 1 tablet by mouth every 8 (eight) hours as needed (pain).  04/05/15  Yes Historical Provider, MD  Insulin Glargine (LANTUS) 100 UNIT/ML Solostar Pen Inject 40 Units into the skin 2 (two) times daily. 12/13/14  Yes Tammi Sou, MD  letrozole ([place]) 2.5 MG tablet TAKE 1 TABLET BY MOUTH EVERY DAY 09/27/14  Yes Tammi Sou, MD  levothyroxine (SYNTHROID, LEVOTHROID) 100 MCG tablet TAKE 1 TABLET BY MOUTH EVERY DAY BEFORE BREAKFAST 12/23/14  Yes Tammi Sou, MD  Linaclotide (LINZESS) 145 MCG CAPS capsule Take 1 capsule (145 mcg total) by mouth daily. 03/30/15   Yes Adrian Blackwater  McGowen, MD  Magnesium Oxide 500 MG (LAX) TABS Take 1,000 mg by mouth daily.    Yes Historical Provider, MD  meclizine (ANTIVERT) 12.5 MG tablet TAKE 1 TO 2 TABLETS BY MOUTH EVERY 6 HOURS AS NEEDED FOR NAUSEA OR DIZZINESS 03/29/15  Yes Tammi Sou, MD  morphine (MS CONTIN) 30 MG 12 hr tablet Take 30 mg by mouth every 12 (twelve) hours.   Yes Historical Provider, MD  Multiple Vitamins-Minerals (MULTIVITAMIN PO) Take 1 tablet by mouth daily.   Yes Historical Provider, MD  NON FORMULARY Place 3-3.5 L into the nose continuous. Copd/emphesema   Yes Historical Provider, MD  omeprazole (PRILOSEC) 40 MG capsule TAKE 1 CAPSULE BY MOUTH TWICE DAILY 03/08/15  Yes Tammi Sou, MD  pregabalin (LYRICA) 150 MG capsule 1 tab po tid 03/30/15  Yes Tammi Sou, MD  rOPINIRole (REQUIP) 2 MG tablet TAKE 1 TABLET BY MOUTH TWICE DAILY 02/28/15  Yes Tammi Sou, MD  citalopram (CELEXA) 20 MG tablet TAKE 1 TABLET BY MOUTH EVERY DAY 03/30/15   Tammi Sou, MD  insulin aspart (NOVOLOG) 100 UNIT/ML injection 3 U SQ qAC Patient not taking: Reported on 05/10/2015 06/17/14   Tammi Sou, MD    Allergies  Allergen Reactions  . Ciprofloxacin Other (See Comments) and Itching    Unspecified--noted in old PCP's records. Unspecified--noted in old PCP's records.  . Clindamycin/Lincomycin Rash  . Fentanyl Rash    Duragesic Patch Duragesic Patch  . Indocin [Indomethacin] Rash  . Keflex [Cephalexin] Rash  . Latex Other (See Comments) and Itching    Unspecified: noted in old PCP's records. Unspecified: noted in old PCP's records.  . Lincomycin Hcl Rash  . Penicillins Rash  . Vancomycin Rash    Physical Exam  Vitals  Blood pressure 173/124, pulse 96, temperature 98.3 F (36.8 C), temperature source Oral, resp. rate 22, SpO2 95 %.   General:  In no acute distress, appears chronically ill  Psych:  Normal affect, Denies Suicidal or Homicidal ideations, Awake Alert, Oriented X 3.  Speech and thought patterns are clear and appropriate, no apparent short term memory deficits  Neuro:   No focal neurological deficits, CN II through XII intact, Strength 3-4/5 all 4 extremities with lower extremities weaker, Sensation intact all 4 extremities.  ENT:  Ears and Eyes appear Normal, Conjunctivae clear, PER. Moist oral mucosa without erythema or exudates.  Neck:  Supple, No lymphadenopathy appreciated  Respiratory:  Symmetrical chest wall movement, diminished air movement bilaterally, consistent with attempts to talk, diminished in bases especially with crackles and upper airways, initially was on 4 L nasal cannula oxygen which is now been increased to 6 L due to complaints of dyspnea  Cardiac:  RRR, No Murmurs, marked bilateral 2-3+ LE edema noted, due to body positioning and body habitus unable to accurately assess for JVD, No carotid bruits, peripheral pulses palpable at 2+; audible event monitor in place  Abdomen:  Positive bowel sounds, Soft, Non tender, Non distended,  No masses appreciated, no obvious hepatosplenomegaly  Skin:  No Cyanosis, Normal Skin Turgor, No Skin Rash or Bruise.  Extremities: Symmetrical without obvious trauma or injury,  no effusions. Charcot changes both feet worse on right; ankle splint on right  Data Review  CBC  Recent Labs Lab 05/10/15 0845  WBC 8.6  HGB 10.0*  HCT 31.8*  PLT 99*  MCV 81.5  MCH 25.6*  MCHC 31.4  RDW 13.9  LYMPHSABS 0.7  MONOABS 0.4  EOSABS 0.1  BASOSABS 0.0    Chemistries   Recent Labs Lab 05/10/15 0845  NA 135  K 4.3  CL 93*  CO2 33*  GLUCOSE 92  BUN 44*  CREATININE 0.80  CALCIUM 8.7*    CrCl cannot be calculated (Unknown ideal weight.).  No results for input(s): TSH, T4TOTAL, T3FREE, THYROIDAB in the last 72 hours.  Invalid input(s): FREET3  Coagulation profile No results for input(s): INR, PROTIME in the last 168 hours.  No results for input(s): DDIMER in the last 72 hours.  Cardiac  Enzymes No results for input(s): CKMB, TROPONINI, MYOGLOBIN in the last 168 hours.  Invalid input(s): CK  Invalid input(s): POCBNP  Urinalysis    Component Value Date/Time   COLORURINE YELLOW 05/10/2015 1010   APPEARANCEUR CLEAR 05/10/2015 1010   LABSPEC 1.011 05/10/2015 1010   PHURINE 5.5 05/10/2015 1010   GLUCOSEU NEGATIVE 05/10/2015 1010   HGBUR SMALL* 05/10/2015 1010   BILIRUBINUR NEGATIVE 05/10/2015 1010   KETONESUR NEGATIVE 05/10/2015 1010   PROTEINUR >300* 05/10/2015 1010   UROBILINOGEN 0.2 09/29/2014 1043   NITRITE NEGATIVE 05/10/2015 1010   LEUKOCYTESUR TRACE* 05/10/2015 1010    Imaging results:   Dg Chest 2 View  05/10/2015  CLINICAL DATA:  Dizziness and lightheadedness. EXAM: CHEST  2 VIEW COMPARISON:  10/27/2014 . FINDINGS: Cardiomegaly with pulmonary vascular prominence and bilateral pulmonary infiltrates noted suggesting congestive heart failure with pulmonary edema. Bilateral pneumonia cannot be excluded. Right lower lobe atelectasis. Right pleural effusion cannot be excluded. No pneumothorax. IMPRESSION: 1. Cardiomegaly with diffuse bilateral pulmonary infiltrates suggesting congestive heart failure pulmonary edema. Bilateral pneumonia cannot be excluded. 2. Dense right lower lobe atelectasis. Right pleural effusion cannot be excluded . Electronically Signed   By: Marcello Moores  Register   On: 05/10/2015 08:37   Dg Pelvis 1-2 Views  05/10/2015  CLINICAL DATA:  Status post fall.  Hurts all over. EXAM: PELVIS - 1-2 VIEW COMPARISON:  None. FINDINGS: There is no evidence of pelvic fracture or diastasis. No pelvic bone lesions are seen. IMPRESSION: No acute osseous injury of the pelvis. Electronically Signed   By: Kathreen Devoid   On: 05/10/2015 08:38   Dg Forearm Right  05/10/2015  CLINICAL DATA:  Status post fall.  Pain all over. EXAM: RIGHT FOREARM - 2 VIEW COMPARISON:  None. FINDINGS: There is no evidence of fracture or other focal bone lesions. Soft tissues are unremarkable.  IMPRESSION: No acute osseous injury of the right forearm. Electronically Signed   By: Kathreen Devoid   On: 05/10/2015 08:37   Dg Ankle Complete Right  05/10/2015  CLINICAL DATA:  Fall. EXAM: RIGHT ANKLE - COMPLETE 3+ VIEW COMPARISON:  04/22/2014 . FINDINGS: Diffuse soft tissue swelling. No acute bony abnormality identified. Plate and screw fixation of the malleoli are noted. Hardware intact. IMPRESSION: Diffuse soft tissue swelling. No acute abnormality. Stable changes of bilateral malleolar open reduction internal fixation. Hardware intact. Electronically Signed   By: Marcello Moores  Register   On: 05/10/2015 08:40   Ct Head Wo Contrast  05/10/2015  CLINICAL DATA:  Fall yesterday at home with headache and posterior neck pain. Initial encounter. EXAM: CT HEAD WITHOUT CONTRAST CT CERVICAL SPINE WITHOUT CONTRAST TECHNIQUE: Multidetector CT imaging of the head and cervical spine was performed following the standard protocol without intravenous contrast. Multiplanar CT image reconstructions of the cervical spine were also generated. COMPARISON:  Head CT 09/29/2014 FINDINGS: CT HEAD FINDINGS Skull and Sinuses:Negative for fracture or destructive process. The visualized mastoids, middle ears, and imaged paranasal sinuses  are clear. Visualized orbits: Right cataract resection Brain: No evidence of acute infarction, hemorrhage, hydrocephalus, or mass lesion/mass effect. No extra-axial collection. Cortical atrophy at the vertex with sulcal widening, stable from 2015. Low-density below the left putamen may reflect a dilated perivascular space. There is a remote small vessel infarct in the peripheral left cerebellum. CT CERVICAL SPINE FINDINGS Motion degraded but overall diagnostic. Negative for acute fracture or subluxation. No prevertebral edema. No gross cervical canal hematoma. No significant osseous canal or foraminal stenosis. Multinodular goiter with greater enlargement on the right, appearance stable from 2015. IMPRESSION:  No evidence of intracranial or cervical spine injury. Electronically Signed   By: Monte Fantasia M.D.   On: 05/10/2015 08:04   Ct Cervical Spine Wo Contrast  05/10/2015  CLINICAL DATA:  Fall yesterday at home with headache and posterior neck pain. Initial encounter. EXAM: CT HEAD WITHOUT CONTRAST CT CERVICAL SPINE WITHOUT CONTRAST TECHNIQUE: Multidetector CT imaging of the head and cervical spine was performed following the standard protocol without intravenous contrast. Multiplanar CT image reconstructions of the cervical spine were also generated. COMPARISON:  Head CT 09/29/2014 FINDINGS: CT HEAD FINDINGS Skull and Sinuses:Negative for fracture or destructive process. The visualized mastoids, middle ears, and imaged paranasal sinuses are clear. Visualized orbits: Right cataract resection Brain: No evidence of acute infarction, hemorrhage, hydrocephalus, or mass lesion/mass effect. No extra-axial collection. Cortical atrophy at the vertex with sulcal widening, stable from 2015. Low-density below the left putamen may reflect a dilated perivascular space. There is a remote small vessel infarct in the peripheral left cerebellum. CT CERVICAL SPINE FINDINGS Motion degraded but overall diagnostic. Negative for acute fracture or subluxation. No prevertebral edema. No gross cervical canal hematoma. No significant osseous canal or foraminal stenosis. Multinodular goiter with greater enlargement on the right, appearance stable from 2015. IMPRESSION: No evidence of intracranial or cervical spine injury. Electronically Signed   By: Monte Fantasia M.D.   On: 05/10/2015 08:04   Dg Humerus Right  05/10/2015  CLINICAL DATA:  Multiple falls over the last few days. EXAM: RIGHT HUMERUS - 2+ VIEW COMPARISON:  None. FINDINGS: There is no evidence of fracture or other focal bone lesions. There are mild degenerative changes of the acromioclavicular joint. Soft tissues are unremarkable. IMPRESSION: No acute osseous injury of the  right humerus. Electronically Signed   By: Kathreen Devoid   On: 05/10/2015 08:36     EKG: (Independently reviewed)  Sinus rhythm with ventricular rate 70 bpm, QTC 435 ms, incomplete right bundle branch block, no acute ischemic changes   Assessment & Plan  Principal Problem:   Acute on chronic respiratory failure with hypoxia:   A) Acute on chronic diastolic congestive heart failure, NYHA class 1    B) COPD GOLD IV criteria but only if use FEV1/VC = 65%   C) Obesity hypoventilation syndrome -Telemetry/observation -Begin aggressive IV diuresis with Lasix 80 mg IV every 12 hours -Echocardiogram -Strict I/O and insert Foley catheter due to patient's underlying immobility and recent issues with urinary incontinence -Daily weights -ACE inhibitor discontinued in June secondary to acute kidney injury with current GFR normal-unclear if can attempt at alternative agent such as an ARB this hospitalization -Current O2 needs higher than baseline of 2 L-wean oxygen as tolerated once heart failure compensated -Repeat chest x-ray in a.m. -Baseline CO2 33-34 with increased O2 need to monitor for acute hypercarbic respiratory failure  Active Problems:   Fibromyalgia/Chronic pain syndrome -Continue preadmission Xanax, Celexa, Norco, and MS Contin -Continue Lyrica but  note patient on very high dose and may need pharmacy declare 5 -Continue Requip    Microalbuminuria due to type 2 diabetes mellitus  -Current urinalysis > 300 of protein with normal GFR -Not on ACE or ARB secondary to acute kidney injury June 2016    HTN  -Continue bisoprolol -Lasix as above    Diabetes mellitus type 2, insulin dependent -Continue preadmission insulin -SSI -Hemoglobin A1c September 2016 5.6    Therapeutic opioid induced constipation -Patient reports vomiting past 24 hours -Abdominal x-ray consistent with retained fecal material throughout colon -Continue Linzess -May require additional bowel regimen -Suspect  bowel edema from heart failure also contributing    Multinodular goiter (nontoxic)/Hypothyroidism -Continue current dose of Synthroid -TSH December 2016 1.27    Closed right trimalleolar fracture -Continue ankle splint    Thrombocytopenia (chronic) -Current platelets near baseline but are less than 100,000 so will not give Lovenox    Morbid obesity     DVT Prophylaxis: SCDs  Family Communication:   Husband at bedside  Code Status:  Full code  Condition:  Stable  Discharge disposition: Anticipate discharge back to home environment once medically stable in heart failure compensated  Time spent in minutes : 60      Kallan Merrick L. ANP on 05/10/2015 at 12:31 PM  You may contact me by going to [email or web address] - password [email or web address]  I am available from 7a-7p but please confirm I am on the schedule by going to Amion as above.   After 7p please contact night coverage person covering me after hours  [facility]

## 2015-05-10 NOTE — ED Notes (Signed)
MD at bedside. 

## 2015-05-10 NOTE — ED Notes (Signed)
Pt remains in xray.

## 2015-05-10 NOTE — Progress Notes (Signed)
  Echocardiogram 2D Echocardiogram has been performed.  Diamond Nickel 05/10/2015, 1:57 PM

## 2015-05-10 NOTE — Care Management Note (Signed)
Case Management Note  Patient Details  Name: Brooke Mccullough MRN: [id number] Date of Birth: [birth year]  Subjective/Objective:                  70 y.o. female with a Past Medical History of COPD, IBS, gallstones, HTN, HLD, benign adrenal tumor, GERD, TIA, gout, DM, migraines, breast cancer who presents with acute on chronic respiratory failure secondary to COPD exacerbation and CHF exacerbation.//Home with spouse.    Action/Plan: Follow for disposition needs.   Expected Discharge Date:      05/12/14            Expected Discharge Plan:  Home w Hospice Care  In-House Referral:  NA  Discharge planning Services  CM Consult  Post Acute Care Choice:    Choice offered to:  [facility] POA / Guardian, Adult Children  DME Arranged:    DME Agency:     HH Arranged:    [place]  Status of Service:  In process, will continue to follow  Medicare Important Message Given:    Date Medicare IM Given:    Medicare IM give by:    Date Additional Medicare IM Given:    Additional Medicare Important Message give by:     If discussed at [place] of Stay Meetings, dates discussed:    Additional Comments: EDCM contacted by Correy [phone] of [facility] stating that pt chose [facility] for [facility] needs of her last hospital admission but canceled because she did not think she was ready.  Correy has been in contact with pt son, Brooke Mccullough [phone] and assured him that they would accept pt when she is ready.  EDCM confirmed with pt son who states that he has been been in conversations with pt to prepare for d/c from hospital. Fuller Mandril, RN 05/10/2015, 2:14 PM

## 2015-05-10 NOTE — ED Provider Notes (Addendum)
CSN: [id number]     Arrival date & time 05/10/15  0865 History   First MD Initiated Contact with Patient 05/10/15 [phone]     Chief Complaint  Patient presents with  . Fall     (Consider location/radiation/quality/duration/timing/severity/associated sxs/prior Treatment) Patient is a 70 y.o. female presenting with fall.  Fall This is a recurrent problem. The current episode started 1 to 2 hours ago. The problem occurs every several days. The problem has not changed since onset.Pertinent negatives include no chest pain, no headaches and no shortness of breath. Nothing aggravates the symptoms. Nothing relieves the symptoms. She has tried nothing for the symptoms.    Past Medical History  Diagnosis Date  . COPD (chronic obstructive pulmonary disease) (HCC)     nocturnal oxygen, plus daytime use with activity  . IBS (irritable bowel syndrome)   . Edema     venous insuff + cor pulmonale  . Gallstones     asymptomatic per pt (as of 09/30/2013); mild chronic elevation of Alk phos (remainder of hepatic panel wnl).  . Hernia of abdominal wall   . Hypertension   . Hyperlipidemia   . Obesity hypoventilation syndrome ([place])     "3-4 L depending on how I feel; 24/7" (09/29/2014)  . Adrenal benign tumor   . Anemia 2015    Baseline Hb 11, normocytic.  iFOB neg 10/2014.  Marland Kitchen GERD (gastroesophageal reflux disease)   . History of duodenal ulcer   . TIA (transient ischemic attack) 2012; 2014    left facial and left arm numbness since  . Chronic lower back pain     "L5-S1" (09/29/2014)  . Gout   . Anxiety and depression   . Tremor     "upper extremities; last 6-7 months" (09/28/2013)  . Multinodular goiter summer 2015    FNA 96/2/95 "Follicular lesion of undetermined significance": a follicular lesion/neoplasm can not be ruled out--Dr. Cruzita Lederer recommended repeat bx in 6-12 mo  . Chronic diastolic CHF (congestive heart failure) ([place]) 2015  . History of pneumonia "once"  . Closed right trimalleolar fracture      Surgery/fixation hardware placed.  Marland Kitchen Unspecified hypothyroidism 12/21/2013  . Type II diabetes mellitus (HCC)     hx of good control per pt report + HbA1c <7% June 2015.  Marland Kitchen History of hiatal hernia   . Migraine     "@ least monthly" (09/29/2014)  . Degenerative disc disease     Cervical and lumbar  . Osteoarthritis (arthritis due to wear and tear of joints)     "back; knees; elbows; left ankle" (09/29/2014)  . Fibromyalgia     Dx'd 1993.  This has impaired her significantly.  . Cancer of right breast ([place]) 2008    Lumpectomy + radiation+aromatase inhibitor (femara).  Regular f/u and mammos normal.  . Obesity, Class II, BMI 35-39.9   . Thrombocytopenia (HCC)     Chronic, mild.  ? Letrozole effect ?--consider getting pt f/u with oncology locally to see if letrozole can be adjusted or stopped.   Past Surgical History  Procedure Laterality Date  . Lumbar laminectomy  1991; 1996  . Orif ankle fracture bimalleolar Left 08/2011  . Breast biopsy Right 2008  . Breast lumpectomy Right 2008  . Cataract extraction w/ intraocular lens implant Right 08/2013  . Cardiac catheterization  2000's X 2  . Combined hysterectomy vaginal / oophorectomy / a&p repair / sacrospinous ligament suspension  03/1989  . Orif ankle fracture Right 02/11/2014    Procedure: OPEN  REDUCTION INTERNAL FIXATION (ORIF) ANKLE FRACTURE;  Surgeon: Alta Corning, MD;  Location: [facility];  Service: Orthopedics;  Laterality: Right;  . Transthoracic echocardiogram  03/2014    Mild LVH, EF 60-65%, grade I diast dysfxn.  Mild MR.  . Holter monitor  08/2014    48H:  NORMAL  . Back surgery    . Fracture surgery    . Vaginal hysterectomy    . Biopsy thyroid  05/2014    "sample wasn't good; will wait 6 months and repeat biopsy"  . Pfts  01/21/15    Obstructive airway dz with unclear severity and superimposed restrictive dz   Family History  Problem Relation Age of Onset  . Arthritis Mother   . Breast cancer Mother   . Hyperlipidemia  Mother   . Stroke Mother   . Hypertension Mother   . Mental illness Mother   . Arthritis Father   . Cancer Father   . Hyperlipidemia Father   . Heart disease Father   . Heart disease Maternal Grandmother   . Hyperlipidemia Maternal Grandmother   . Heart disease Paternal Grandmother   . Hypertension Paternal Grandmother   . Diabetes Paternal Grandmother   . Arthritis Paternal Grandfather    Social History  Substance Use Topics  . Smoking status: Former Smoker -- 1.00 packs/day for 36 years    Types: Cigarettes    Quit date: 09/09/2013  . Smokeless tobacco: Never Used  . Alcohol Use: No   OB History    No data available     Review of Systems  Constitutional: Negative for fever and chills.  Eyes: Negative for pain.  Respiratory: Negative for cough and shortness of breath.   Cardiovascular: Negative for chest pain.  Gastrointestinal: Negative for nausea and vomiting.  Musculoskeletal: Positive for joint swelling and neck pain.       Right shoulder, wrist and ankle pain  Neurological: Negative for headaches.  All other systems reviewed and are negative.     Allergies  Ciprofloxacin; Clindamycin/lincomycin; Fentanyl; Indocin; Keflex; Latex; Lincomycin hcl; Penicillins; and Vancomycin  Home Medications   Prior to Admission medications   Medication Sig Start Date End Date Taking? Authorizing Provider  ALPRAZolam (XANAX) 0.5 MG tablet TAKE 1 TABLET BY MOUTH TWICE DAILY AS NEEDED FOR ANXIETY OR SLEEP 03/30/15  Yes Tammi Sou, MD  atorvastatin (LIPITOR) 40 MG tablet TAKE 1 TABLET BY MOUTH DAILY 03/21/15  Yes Tammi Sou, MD  bisoprolol (ZEBETA) 5 MG tablet TAKE 1 TABLET(5 MG) BY MOUTH DAILY 02/21/15  Yes Tammi Sou, MD  cetirizine (ZYRTEC) 10 MG tablet Take 10 mg by mouth daily as needed for allergies.   Yes Historical Provider, MD  cholecalciferol (VITAMIN D) 1000 UNITS tablet Take 1,000 Units by mouth daily.   Yes Historical Provider, MD  clopidogrel  (PLAVIX) 75 MG tablet Take 1 tablet (75 mg total) by mouth daily. 06/17/14  Yes Tammi Sou, MD  ferrous sulfate 325 (65 FE) MG tablet Take 325 mg by mouth 2 (two) times daily with a meal.   Yes Historical Provider, MD  furosemide (LASIX) 40 MG tablet Take 1.5 tablets (60 mg total) by mouth 2 (two) times daily. Patient taking differently: Take 40 mg by mouth 2 (two) times daily.  04/26/15  Yes Burtis Junes, NP  HYDROcodone-acetaminophen (NORCO) 7.5-325 MG tablet Take 1 tablet by mouth every 8 (eight) hours as needed (pain).  04/05/15  Yes Historical Provider, MD  Insulin Glargine (LANTUS) 100 UNIT/ML  Solostar Pen Inject 40 Units into the skin 2 (two) times daily. 12/13/14  Yes Tammi Sou, MD  letrozole ([place]) 2.5 MG tablet TAKE 1 TABLET BY MOUTH EVERY DAY 09/27/14  Yes Tammi Sou, MD  levothyroxine (SYNTHROID, LEVOTHROID) 100 MCG tablet TAKE 1 TABLET BY MOUTH EVERY DAY BEFORE BREAKFAST 12/23/14  Yes Tammi Sou, MD  Linaclotide (LINZESS) 145 MCG CAPS capsule Take 1 capsule (145 mcg total) by mouth daily. 03/30/15  Yes Tammi Sou, MD  Magnesium Oxide 500 MG (LAX) TABS Take 1,000 mg by mouth daily.    Yes Historical Provider, MD  meclizine (ANTIVERT) 12.5 MG tablet TAKE 1 TO 2 TABLETS BY MOUTH EVERY 6 HOURS AS NEEDED FOR NAUSEA OR DIZZINESS 03/29/15  Yes Tammi Sou, MD  morphine (MS CONTIN) 30 MG 12 hr tablet Take 30 mg by mouth every 12 (twelve) hours.   Yes Historical Provider, MD  Multiple Vitamins-Minerals (MULTIVITAMIN PO) Take 1 tablet by mouth daily.   Yes Historical Provider, MD  NON FORMULARY Place 3-3.5 L into the nose continuous. Copd/emphesema   Yes Historical Provider, MD  omeprazole (PRILOSEC) 40 MG capsule TAKE 1 CAPSULE BY MOUTH TWICE DAILY 03/08/15  Yes Tammi Sou, MD  pregabalin (LYRICA) 150 MG capsule 1 tab po tid 03/30/15  Yes Tammi Sou, MD  rOPINIRole (REQUIP) 2 MG tablet TAKE 1 TABLET BY MOUTH TWICE DAILY 02/28/15  Yes Tammi Sou, MD   citalopram (CELEXA) 20 MG tablet TAKE 1 TABLET BY MOUTH EVERY DAY 03/30/15   Tammi Sou, MD  insulin aspart (NOVOLOG) 100 UNIT/ML injection 3 U SQ qAC Patient not taking: Reported on 05/10/2015 06/17/14   Tammi Sou, MD   BP 159/70 mmHg  Pulse 70  Temp(Src) 98.3 F (36.8 C) (Oral)  Resp 18  SpO2 95% Physical Exam  Constitutional: She is oriented to person, place, and time. She appears well-developed and well-nourished.  HENT:  Head: Normocephalic and atraumatic.  Neck: Normal range of motion.  Cardiovascular: Normal rate and regular rhythm.   Pulmonary/Chest: Effort normal. No stridor. No respiratory distress. She has no wheezes.  Abdominal: Soft. She exhibits no distension. There is no tenderness.  Musculoskeletal: Normal range of motion. She exhibits tenderness (right humerus). She exhibits no edema.  Ecchymosis around right wrist, right proximal humerus  Neurological: She is alert and oriented to person, place, and time. Coordination normal.  Skin: Skin is warm and dry.  Nursing note and vitals reviewed.   ED Course  Procedures (including critical care time) Labs Review Labs Reviewed  CBC WITH DIFFERENTIAL/PLATELET - Abnormal; Notable for the following:    Hemoglobin 10.0 (*)    HCT 31.8 (*)    MCH 25.6 (*)    Platelets 99 (*)    All other components within normal limits  BASIC METABOLIC PANEL - Abnormal; Notable for the following:    Chloride 93 (*)    CO2 33 (*)    BUN 44 (*)    Calcium 8.7 (*)    All other components within normal limits  URINALYSIS, ROUTINE W REFLEX MICROSCOPIC (NOT AT [facility]) - Abnormal; Notable for the following:    Hgb urine dipstick SMALL (*)    Protein, ur >300 (*)    Leukocytes, UA TRACE (*)    All other components within normal limits  BRAIN NATRIURETIC PEPTIDE - Abnormal; Notable for the following:    B Natriuretic Peptide 322.7 (*)    All other components within normal limits  URINE MICROSCOPIC-ADD ON - Abnormal; Notable for  the following:    Squamous Epithelial / LPF 0-5 (*)    All other components within normal limits  CBG MONITORING, ED    Imaging Review Dg Chest 2 View  05/10/2015  CLINICAL DATA:  Dizziness and lightheadedness. EXAM: CHEST  2 VIEW COMPARISON:  10/27/2014 . FINDINGS: Cardiomegaly with pulmonary vascular prominence and bilateral pulmonary infiltrates noted suggesting congestive heart failure with pulmonary edema. Bilateral pneumonia cannot be excluded. Right lower lobe atelectasis. Right pleural effusion cannot be excluded. No pneumothorax. IMPRESSION: 1. Cardiomegaly with diffuse bilateral pulmonary infiltrates suggesting congestive heart failure pulmonary edema. Bilateral pneumonia cannot be excluded. 2. Dense right lower lobe atelectasis. Right pleural effusion cannot be excluded . Electronically Signed   By: Marcello Moores  Register   On: 05/10/2015 08:37   Dg Pelvis 1-2 Views  05/10/2015  CLINICAL DATA:  Status post fall.  Hurts all over. EXAM: PELVIS - 1-2 VIEW COMPARISON:  None. FINDINGS: There is no evidence of pelvic fracture or diastasis. No pelvic bone lesions are seen. IMPRESSION: No acute osseous injury of the pelvis. Electronically Signed   By: Kathreen Devoid   On: 05/10/2015 08:38   Dg Forearm Right  05/10/2015  CLINICAL DATA:  Status post fall.  Pain all over. EXAM: RIGHT FOREARM - 2 VIEW COMPARISON:  None. FINDINGS: There is no evidence of fracture or other focal bone lesions. Soft tissues are unremarkable. IMPRESSION: No acute osseous injury of the right forearm. Electronically Signed   By: Kathreen Devoid   On: 05/10/2015 08:37   Dg Ankle Complete Right  05/10/2015  CLINICAL DATA:  Fall. EXAM: RIGHT ANKLE - COMPLETE 3+ VIEW COMPARISON:  04/22/2014 . FINDINGS: Diffuse soft tissue swelling. No acute bony abnormality identified. Plate and screw fixation of the malleoli are noted. Hardware intact. IMPRESSION: Diffuse soft tissue swelling. No acute abnormality. Stable changes of bilateral malleolar  open reduction internal fixation. Hardware intact. Electronically Signed   By: Marcello Moores  Register   On: 05/10/2015 08:40   Ct Head Wo Contrast  05/10/2015  CLINICAL DATA:  Fall yesterday at home with headache and posterior neck pain. Initial encounter. EXAM: CT HEAD WITHOUT CONTRAST CT CERVICAL SPINE WITHOUT CONTRAST TECHNIQUE: Multidetector CT imaging of the head and cervical spine was performed following the standard protocol without intravenous contrast. Multiplanar CT image reconstructions of the cervical spine were also generated. COMPARISON:  Head CT 09/29/2014 FINDINGS: CT HEAD FINDINGS Skull and Sinuses:Negative for fracture or destructive process. The visualized mastoids, middle ears, and imaged paranasal sinuses are clear. Visualized orbits: Right cataract resection Brain: No evidence of acute infarction, hemorrhage, hydrocephalus, or mass lesion/mass effect. No extra-axial collection. Cortical atrophy at the vertex with sulcal widening, stable from 2015. Low-density below the left putamen may reflect a dilated perivascular space. There is a remote small vessel infarct in the peripheral left cerebellum. CT CERVICAL SPINE FINDINGS Motion degraded but overall diagnostic. Negative for acute fracture or subluxation. No prevertebral edema. No gross cervical canal hematoma. No significant osseous canal or foraminal stenosis. Multinodular goiter with greater enlargement on the right, appearance stable from 2015. IMPRESSION: No evidence of intracranial or cervical spine injury. Electronically Signed   By: Monte Fantasia M.D.   On: 05/10/2015 08:04   Ct Cervical Spine Wo Contrast  05/10/2015  CLINICAL DATA:  Fall yesterday at home with headache and posterior neck pain. Initial encounter. EXAM: CT HEAD WITHOUT CONTRAST CT CERVICAL SPINE WITHOUT CONTRAST TECHNIQUE: Multidetector CT imaging of the head and  cervical spine was performed following the standard protocol without intravenous contrast. Multiplanar CT  image reconstructions of the cervical spine were also generated. COMPARISON:  Head CT 09/29/2014 FINDINGS: CT HEAD FINDINGS Skull and Sinuses:Negative for fracture or destructive process. The visualized mastoids, middle ears, and imaged paranasal sinuses are clear. Visualized orbits: Right cataract resection Brain: No evidence of acute infarction, hemorrhage, hydrocephalus, or mass lesion/mass effect. No extra-axial collection. Cortical atrophy at the vertex with sulcal widening, stable from 2015. Low-density below the left putamen may reflect a dilated perivascular space. There is a remote small vessel infarct in the peripheral left cerebellum. CT CERVICAL SPINE FINDINGS Motion degraded but overall diagnostic. Negative for acute fracture or subluxation. No prevertebral edema. No gross cervical canal hematoma. No significant osseous canal or foraminal stenosis. Multinodular goiter with greater enlargement on the right, appearance stable from 2015. IMPRESSION: No evidence of intracranial or cervical spine injury. Electronically Signed   By: Monte Fantasia M.D.   On: 05/10/2015 08:04   Dg Humerus Right  05/10/2015  CLINICAL DATA:  Multiple falls over the last few days. EXAM: RIGHT HUMERUS - 2+ VIEW COMPARISON:  None. FINDINGS: There is no evidence of fracture or other focal bone lesions. There are mild degenerative changes of the acromioclavicular joint. Soft tissues are unremarkable. IMPRESSION: No acute osseous injury of the right humerus. Electronically Signed   By: Kathreen Devoid   On: 05/10/2015 08:36   I have personally reviewed and evaluated these images and lab results as part of my medical decision-making.   EKG Interpretation   Date/Time:  Tuesday May 10 2015 07:20:14 EST Ventricular Rate:  70 PR Interval:  195 QRS Duration: 95 QT Interval:  403 QTC Calculation: 435 R Axis:   22 Text Interpretation:  Sinus rhythm Low voltage, precordial leads Confirmed  by Aneesha Holloran MD, Corene Cornea [phone]) on  05/10/2015 8:26:18 AM      MDM   Final diagnoses:  Constipated  Acute diastolic heart failure, NYHA class 1 (HCC)   Multiple falls over last years, decreased mobility 2/2 deconditioning and balance and weakness.   X-rays and CTs are negative. Our patient with acute worsening dyspnea. Review of her chest x-ray she does seem to have pulmonary edema on there. On reevaluation she was more tachypneic requiring 5 L of oxygen is above her baseline of 3. Had some crackles in the bases. No lower extremity edema. No distress. Seemed to be related to fluid overload so IV Lasix given, BNP added on to her previous labs. Heart failure could be related to the reason why she's had some falls recently and her overall weakness. We'll discuss with hospitalist about admission.      Merrily Pew, MD 05/11/15 5498  Merrily Pew, MD 05/11/15 [phone]

## 2015-05-10 NOTE — ED Notes (Addendum)
Pt reports multiple falls over the last few days. Pt reports feeling dizzy and lightheaded as well. Pt arrives with a heart monitor in place. Alert and oriented on arrival. Bruising noted to rt shoulder and hand.

## 2015-05-11 ENCOUNTER — Observation Stay (HOSPITAL_COMMUNITY): Payer: Medicare Other

## 2015-05-11 DIAGNOSIS — R809 Proteinuria, unspecified: Secondary | ICD-10-CM | POA: Diagnosis present

## 2015-05-11 DIAGNOSIS — Z794 Long term (current) use of insulin: Secondary | ICD-10-CM | POA: Diagnosis not present

## 2015-05-11 DIAGNOSIS — T402X5A Adverse effect of other opioids, initial encounter: Secondary | ICD-10-CM | POA: Diagnosis present

## 2015-05-11 DIAGNOSIS — Z8673 Personal history of transient ischemic attack (TIA), and cerebral infarction without residual deficits: Secondary | ICD-10-CM | POA: Diagnosis not present

## 2015-05-11 DIAGNOSIS — E042 Nontoxic multinodular goiter: Secondary | ICD-10-CM | POA: Diagnosis present

## 2015-05-11 DIAGNOSIS — Z888 Allergy status to other drugs, medicaments and biological substances status: Secondary | ICD-10-CM | POA: Diagnosis not present

## 2015-05-11 DIAGNOSIS — E039 Hypothyroidism, unspecified: Secondary | ICD-10-CM | POA: Diagnosis present

## 2015-05-11 DIAGNOSIS — Z853 Personal history of malignant neoplasm of breast: Secondary | ICD-10-CM | POA: Diagnosis not present

## 2015-05-11 DIAGNOSIS — Z91040 Latex allergy status: Secondary | ICD-10-CM | POA: Diagnosis not present

## 2015-05-11 DIAGNOSIS — Z7902 Long term (current) use of antithrombotics/antiplatelets: Secondary | ICD-10-CM | POA: Diagnosis not present

## 2015-05-11 DIAGNOSIS — E785 Hyperlipidemia, unspecified: Secondary | ICD-10-CM | POA: Diagnosis present

## 2015-05-11 DIAGNOSIS — Z881 Allergy status to other antibiotic agents status: Secondary | ICD-10-CM | POA: Diagnosis not present

## 2015-05-11 DIAGNOSIS — E662 Morbid (severe) obesity with alveolar hypoventilation: Secondary | ICD-10-CM | POA: Diagnosis present

## 2015-05-11 DIAGNOSIS — K219 Gastro-esophageal reflux disease without esophagitis: Secondary | ICD-10-CM | POA: Diagnosis present

## 2015-05-11 DIAGNOSIS — K5903 Drug induced constipation: Secondary | ICD-10-CM | POA: Diagnosis present

## 2015-05-11 DIAGNOSIS — J9621 Acute and chronic respiratory failure with hypoxia: Secondary | ICD-10-CM | POA: Diagnosis present

## 2015-05-11 DIAGNOSIS — Z87891 Personal history of nicotine dependence: Secondary | ICD-10-CM | POA: Diagnosis not present

## 2015-05-11 DIAGNOSIS — M109 Gout, unspecified: Secondary | ICD-10-CM | POA: Diagnosis present

## 2015-05-11 DIAGNOSIS — Z993 Dependence on wheelchair: Secondary | ICD-10-CM | POA: Diagnosis not present

## 2015-05-11 DIAGNOSIS — Z79899 Other long term (current) drug therapy: Secondary | ICD-10-CM | POA: Diagnosis not present

## 2015-05-11 DIAGNOSIS — W050XXA Fall from non-moving wheelchair, initial encounter: Secondary | ICD-10-CM | POA: Diagnosis present

## 2015-05-11 DIAGNOSIS — Z885 Allergy status to narcotic agent status: Secondary | ICD-10-CM | POA: Diagnosis not present

## 2015-05-11 DIAGNOSIS — F419 Anxiety disorder, unspecified: Secondary | ICD-10-CM | POA: Diagnosis present

## 2015-05-11 DIAGNOSIS — Z880 Allergy status to penicillin: Secondary | ICD-10-CM | POA: Diagnosis not present

## 2015-05-11 DIAGNOSIS — I2781 Cor pulmonale (chronic): Secondary | ICD-10-CM | POA: Diagnosis present

## 2015-05-11 DIAGNOSIS — K5900 Constipation, unspecified: Secondary | ICD-10-CM | POA: Diagnosis present

## 2015-05-11 DIAGNOSIS — I5033 Acute on chronic diastolic (congestive) heart failure: Secondary | ICD-10-CM | POA: Diagnosis not present

## 2015-05-11 DIAGNOSIS — M797 Fibromyalgia: Secondary | ICD-10-CM | POA: Diagnosis present

## 2015-05-11 DIAGNOSIS — K589 Irritable bowel syndrome without diarrhea: Secondary | ICD-10-CM | POA: Diagnosis present

## 2015-05-11 DIAGNOSIS — S82851A Displaced trimalleolar fracture of right lower leg, initial encounter for closed fracture: Secondary | ICD-10-CM | POA: Diagnosis not present

## 2015-05-11 DIAGNOSIS — G894 Chronic pain syndrome: Secondary | ICD-10-CM | POA: Diagnosis present

## 2015-05-11 DIAGNOSIS — R296 Repeated falls: Secondary | ICD-10-CM | POA: Diagnosis present

## 2015-05-11 DIAGNOSIS — I10 Essential (primary) hypertension: Secondary | ICD-10-CM | POA: Diagnosis present

## 2015-05-11 DIAGNOSIS — J449 Chronic obstructive pulmonary disease, unspecified: Secondary | ICD-10-CM | POA: Diagnosis present

## 2015-05-11 DIAGNOSIS — E119 Type 2 diabetes mellitus without complications: Secondary | ICD-10-CM | POA: Diagnosis present

## 2015-05-11 DIAGNOSIS — D696 Thrombocytopenia, unspecified: Secondary | ICD-10-CM | POA: Diagnosis present

## 2015-05-11 DIAGNOSIS — Z6841 Body Mass Index (BMI) 40.0 and over, adult: Secondary | ICD-10-CM | POA: Diagnosis not present

## 2015-05-11 DIAGNOSIS — F329 Major depressive disorder, single episode, unspecified: Secondary | ICD-10-CM | POA: Diagnosis present

## 2015-05-11 LAB — BASIC METABOLIC PANEL
ANION GAP: 7 (ref 5–15)
BUN: 39 mg/dL — ABNORMAL HIGH (ref 6–20)
CHLORIDE: 95 mmol/L — AB (ref 101–111)
CO2: 37 mmol/L — AB (ref 22–32)
Calcium: 8.7 mg/dL — ABNORMAL LOW (ref 8.9–10.3)
Creatinine, Ser: 0.9 mg/dL (ref 0.44–1.00)
GFR calc non Af Amer: >60 mL/min (ref >60)
Glucose, Bld: 102 mg/dL — ABNORMAL HIGH (ref 65–99)
Potassium: 4.1 mmol/L (ref 3.5–5.1)
Sodium: 139 mmol/L (ref 135–145)

## 2015-05-11 LAB — HEMOGLOBIN A1C
Hgb A1c MFr Bld: 6 % — ABNORMAL HIGH (ref 4.8–5.6)
Mean Plasma Glucose: 126 mg/dL

## 2015-05-11 LAB — GLUCOSE, CAPILLARY
GLUCOSE-CAPILLARY: 72 mg/dL (ref 65–99)
GLUCOSE-CAPILLARY: 108 mg/dL — AB (ref 65–99)
Glucose-Capillary: 127 mg/dL — ABNORMAL HIGH (ref 65–99)

## 2015-05-11 LAB — MRSA PCR SCREENING: MRSA BY PCR: NEGATIVE

## 2015-05-11 MED ORDER — ENOXAPARIN SODIUM 40 MG/0.4ML ~~LOC~~ SOLN
40.0000 mg | Freq: Every day | SUBCUTANEOUS | Status: DC
  Administered 2015-05-11 – 2015-05-16 (×6): 40 mg via SUBCUTANEOUS
  Filled 2015-05-11 (×6): qty 0.4

## 2015-05-11 MED ORDER — INSULIN GLARGINE 100 UNIT/ML ~~LOC~~ SOLN
20.0000 [IU] | Freq: Two times a day (BID) | SUBCUTANEOUS | Status: DC
  Administered 2015-05-11 – 2015-05-16 (×10): 20 [IU] via SUBCUTANEOUS
  Filled 2015-05-11 (×12): qty 0.2

## 2015-05-11 MED ORDER — FUROSEMIDE 10 MG/ML IJ SOLN
80.0000 mg | Freq: Four times a day (QID) | INTRAMUSCULAR | Status: DC
  Administered 2015-05-11 – 2015-05-13 (×8): 80 mg via INTRAVENOUS
  Filled 2015-05-11 (×8): qty 8

## 2015-05-11 NOTE — Progress Notes (Signed)
[facility] TEAM 1 - Stepdown/ICU TEAM PROGRESS NOTE  Brooke Mccullough [id number] DOB: 11/23/1945 DOA: 05/10/2015 PCP: Tammi Sou, MD  Admit HPI / Brief Narrative: 70 year old female with hx of COPD and obesity hypoventilation syndrome, recurrent exacerbations of grade 1 diastolic heart failure, hypertension, microalbuminuria in setting of diabetes, chronic pain syndrome with fibromyalgia, opioid-induced constipation, diabetes, hypothyroidism, chronic thrombocytopenia, chronic closed right trimalleolar fracture with splint, and morbid obesity. Patient presented to the ER after becoming dizzy and lightheaded and falling out of her wheelchair and striking her head against a dresser. She had been having issues with progressive dyspnea. She was evaluated by her Cardiologist on 1/3 at which point her Lasix was increased from 40 to 60 mg by mouth twice a day. During that evaluation patient was found to be hypoxemic with saturation 77% on 3 L pulse flow. She was transitioned over to 2 L continuous oxygen and had improved saturations greater than 90 at that time. Patient reports that after increase in Lasix dosage she has not had any significant increase in urinary output or improvement in her symptoms. She'd been having issues with palpitations and had an event monitor placed. Marland Kitchen  HPI/Subjective: Called to the room urgently by RN.  Pt c/o severe sob not improving on venturi mask.  Sats into 70s per RN report.  O2 titrated by RT.  Upon my arrival to room pt on venturi mask w/ sats 100%.  She is clearly anxious and dyspneic, but not in extremis.  She is also clearly volume overloaded.  She denies SSCP, N/V, or abdom pain.    Assessment/Plan:  Acute on chronic hypoxic respiratory failure due to Acute on chronic diastolic congestive heart failure -grossly volume overloaded on exam - increase diuretic - may require short term/intermittent BIPAP so transfer to SDU - watch Is/Os and daily weights  -ACE  inhibitor discontinued in June secondary to acute kidney injury   COPD w/ Obesity hypoventilation syndrome -no evidence of bronchospastic exacerbation at present  Fibromyalgia/Chronic pain syndrome -cont usual home meds - appears to be controlled   DM2 w/ Microalbuminuria  -Not on ACE or ARB secondary to acute kidney injury June 2016 - A1c 6.0 - follow CBG   HTN  -very poorly controlled presently, but pt also anxious - tx anxiety - diurese - follow   Opioid induced constipation -tx and follow   Multinodular goiter / Hypothyroidism -Continue Synthroid -TSH December 2016 1.27  Closed right trimalleolar fracture -Continue ankle splint  Chronic Thrombocytopenia -Current platelets near baseline but are less than 100,000 so will not give Lovenox  Morbid obesity - Body mass index is 41.68 kg/(m^2).  Code Status: FULL Family Communication: spoke w/ husband at bedside  Disposition Plan: transfer to SDU   Consultants: none   Procedures: 1/17 TTE - EF 60-65% - mild LVH - no WMA - LA mod dilated  Antibiotics: none  DVT prophylaxis: lovenox   Objective: Blood pressure 190/57, pulse 57, temperature 98.2 F (36.8 C), temperature source Oral, resp. rate 20, height 5\' 4"  (1.626 m), weight 110.2 kg (242 lb 15.2 oz), SpO2 100 %.  Intake/Output Summary (Last 24 hours) at 05/11/15 0936 Last data filed at 05/11/15 [id number]  Gross per 24 hour  Intake    240 ml  Output   1450 ml  Net  -1210 ml   Exam: General: mild resp distress w/ tachpynea and anxiety  Lungs: poor air movement th/o all fields - pronounced crackles B bases - no wheeze  Cardiovascular: tachycardic but  regular w/o M or gallup  Abdomen: Nontender, obese, soft, bowel sounds positive, no rebound, no ascites, no appreciable mass Extremities: No significant cyanosis, or clubbing; 3+ pitting edema bilateral lower extremities w/ changes of chronic venous stasis   Data Reviewed:  Basic Metabolic Panel:  Recent Labs Lab  05/10/15 0845 05/11/15 0458  NA 135 139  K 4.3 4.1  CL 93* 95*  CO2 33* 37*  GLUCOSE 92 102*  BUN 44* 39*  CREATININE 0.80 0.90  CALCIUM 8.7* 8.7*   CBC:  Recent Labs Lab 05/10/15 0845  WBC 8.6  NEUTROABS 7.5  HGB 10.0*  HCT 31.8*  MCV 81.5  PLT 99*    Liver Function Tests: No results for input(s): AST, ALT, ALKPHOS, BILITOT, PROT, ALBUMIN in the last 168 hours. No results for input(s): LIPASE, AMYLASE in the last 168 hours. No results for input(s): AMMONIA in the last 168 hours.  CBG:  Recent Labs Lab 05/10/15 0832 05/10/15 2227 05/11/15 0625  GLUCAP 82 125* 72    Studies:   Recent x-ray studies have been reviewed in detail by the Attending Physician  Scheduled Meds:  Scheduled Meds: . atorvastatin  40 mg Oral q1800  . bisoprolol  5 mg Oral Daily  . cholecalciferol  1,000 Units Oral Daily  . citalopram  20 mg Oral Daily  . clopidogrel  75 mg Oral Daily  . ferrous sulfate  325 mg Oral BID WC  . furosemide  80 mg Intravenous Q12H  . insulin aspart  0-5 Units Subcutaneous QHS  . insulin aspart  0-9 Units Subcutaneous TID WC  . insulin glargine  40 Units Subcutaneous BID  . letrozole  2.5 mg Oral Daily  . levothyroxine  100 mcg Oral QAC breakfast  . Linaclotide  145 mcg Oral Daily  . loratadine  10 mg Oral Daily  . magnesium oxide  800 mg Oral Daily  . morphine  30 mg Oral Q12H  . pantoprazole  40 mg Oral Daily  . pregabalin  150 mg Oral TID  . rOPINIRole  2 mg Oral BID  . sodium chloride  3 mL Intravenous Q12H    Time spent on care of this patient: 35 mins   Jenner Rosier T , MD   [facility] Office  [phone] Pager - Text Page per Shea Evans as per below:  On-Call/Text Page:      [email or web address]      password [email or web address]  If 7PM-7AM, please contact night-coverage [email or web address] Password [email or web address] 05/11/2015, 9:36 AM   LOS: 1 day

## 2015-05-11 NOTE — Significant Event (Signed)
Rapid Response Event Note  Overview:  Called to assist with patient with increased O2 needs and resp distress Time Called: 0945 Arrival Time: 0948 Event Type: Respiratory  Initial Focused Assessment: On arrival patient sitting up in bed - alert - warm and dry- pink - tachypnea noted RR 28-32 with mild distress - able to speak full sentences - on 55% VM - O2 sats 99% - states she feels better after "oral care" -  bil BS present distant - fine crackles throughout - edema noted in legs and ankles bilaterally - abd soft - denies CP - states she is breathing much better now - SR 72 on monitor BP 202/72 - Dr. Thereasa Solo to bedside - aware of VS.     Interventions:  Supportive care to patient and staff - no acute treatment at this time.  Will move to SDU per Dr. Thereasa Solo - spoke with [facility] - will move as transfer occurs off SDU - Dr. Thereasa Solo aware - POC discussed with Dr. Thereasa Solo- orders for diuresis- no acute tx for BP for now -becomes acutely SOB will Bipap as needed.  Handoff to [place] - to call as needed.    Event Summary: Name of Physician Notified: Dr. Thereasa Solo at  (pta rrt)    at    Outcome: Transferred (Comment) (will tx to SDU for closer monitor)  Event End Time: 1005  Quin Hoop

## 2015-05-11 NOTE — Progress Notes (Signed)
Patient O2 at 77% via Nasal cannula at 6 l. Called Respiratory therapist immediatelyand put the patient on venturi mask at 55%. O2 at 99-100%. Xanax and lasix  given. Dr. Thereasa Solo and Rapid Respose nurse notfied. Lauren (Asst. Director)  and husband at bedside. Patient will be transferring to step-down.

## 2015-05-11 NOTE — Progress Notes (Signed)
Pt a/o, takes scheduled meds for pain, at beginning of shift pt stated she was SOB, O2 97% on 6L, pt sat up in bed and PRN Xanax given, pt later then stated she felt better, pt now resting comfortably, VSS, pt stable

## 2015-05-11 NOTE — Progress Notes (Signed)
Patient will be transferring to 2H19 due  to acute SOB and patient will be on Bipap as needed. reprot given to [place].

## 2015-05-11 NOTE — Progress Notes (Signed)
PT was <88% on 6 lpm [place]- mouth breather. RT placed PT on 55% VM- sp02 94% at this time. RN aware.

## 2015-05-11 NOTE — Progress Notes (Signed)
Patient transferred to 2H19 at 2:20 pm accompanied by RN and NT.

## 2015-05-12 ENCOUNTER — Encounter: Payer: Self-pay | Admitting: Family Medicine

## 2015-05-12 DIAGNOSIS — S82851A Displaced trimalleolar fracture of right lower leg, initial encounter for closed fracture: Secondary | ICD-10-CM

## 2015-05-12 DIAGNOSIS — D696 Thrombocytopenia, unspecified: Secondary | ICD-10-CM

## 2015-05-12 DIAGNOSIS — E038 Other specified hypothyroidism: Secondary | ICD-10-CM

## 2015-05-12 DIAGNOSIS — E662 Morbid (severe) obesity with alveolar hypoventilation: Secondary | ICD-10-CM

## 2015-05-12 DIAGNOSIS — M797 Fibromyalgia: Secondary | ICD-10-CM

## 2015-05-12 LAB — COMPREHENSIVE METABOLIC PANEL
ALBUMIN: 2.8 g/dL — AB (ref 3.5–5.0)
ALT: 25 U/L (ref 14–54)
ANION GAP: 7 (ref 5–15)
AST: 22 U/L (ref 15–41)
Alkaline Phosphatase: 97 U/L (ref 38–126)
BILIRUBIN TOTAL: 0.7 mg/dL (ref 0.3–1.2)
BUN: 37 mg/dL — ABNORMAL HIGH (ref 6–20)
CHLORIDE: 98 mmol/L — AB (ref 101–111)
CO2: 39 mmol/L — ABNORMAL HIGH (ref 22–32)
Calcium: 9 mg/dL (ref 8.9–10.3)
Creatinine, Ser: 0.82 mg/dL (ref 0.44–1.00)
GFR calc Af Amer: >60 mL/min (ref >60)
GFR calc non Af Amer: >60 mL/min (ref >60)
GLUCOSE: 53 mg/dL — AB (ref 65–99)
POTASSIUM: 3.6 mmol/L (ref 3.5–5.1)
SODIUM: 144 mmol/L (ref 135–145)
TOTAL PROTEIN: 6.3 g/dL — AB (ref 6.5–8.1)

## 2015-05-12 LAB — CBC
HEMATOCRIT: 31.1 % — AB (ref 36.0–46.0)
HEMOGLOBIN: 9.5 g/dL — AB (ref 12.0–15.0)
MCH: 25.1 pg — ABNORMAL LOW (ref 26.0–34.0)
MCHC: 30.5 g/dL (ref 30.0–36.0)
MCV: 82.3 fL (ref 78.0–100.0)
Platelets: 115 10*3/uL — ABNORMAL LOW (ref 150–400)
RBC: 3.78 MIL/uL — ABNORMAL LOW (ref 3.87–5.11)
RDW: 14.3 % (ref 11.5–15.5)
WBC: 8.4 10*3/uL (ref 4.0–10.5)

## 2015-05-12 LAB — GLUCOSE, CAPILLARY
GLUCOSE-CAPILLARY: 131 mg/dL — AB (ref 65–99)
GLUCOSE-CAPILLARY: 45 mg/dL — AB (ref 65–99)
GLUCOSE-CAPILLARY: 106 mg/dL — AB (ref 65–99)
GLUCOSE-CAPILLARY: 93 mg/dL (ref 65–99)
Glucose-Capillary: 89 mg/dL (ref 65–99)
Glucose-Capillary: 162 mg/dL — ABNORMAL HIGH (ref 65–99)

## 2015-05-12 LAB — PROCALCITONIN

## 2015-05-12 MED ORDER — DEXTROSE 50 % IV SOLN
INTRAVENOUS | Status: AC
  Administered 2015-05-12: 50 mL
  Filled 2015-05-12: qty 50

## 2015-05-12 MED ORDER — HYDRALAZINE HCL 10 MG PO TABS
10.0000 mg | ORAL_TABLET | Freq: Four times a day (QID) | ORAL | Status: DC
  Administered 2015-05-12 – 2015-05-13 (×4): 10 mg via ORAL
  Filled 2015-05-12 (×4): qty 1

## 2015-05-12 MED ORDER — CETYLPYRIDINIUM CHLORIDE 0.05 % MT LIQD
7.0000 mL | Freq: Two times a day (BID) | OROMUCOSAL | Status: DC
  Administered 2015-05-13 – 2015-05-15 (×5): 7 mL via OROMUCOSAL

## 2015-05-12 NOTE — Progress Notes (Signed)
Hypoglycemic Event  CBG: 45  Treatment: D50 IV 50 mL  Symptoms: None  Follow-up CBG: [id number] CBG Result:130  Possible Reasons for Event: Unknown  Comments/MD notified:Dr. Izora Gala, Loleta Chance

## 2015-05-12 NOTE — Progress Notes (Signed)
[place] TEAM 1 - Stepdown/ICU TEAM Progress Note  Brooke Mccullough [id number] DOB: May 27, 1945 DOA: 05/10/2015 PCP: Tammi Sou, MD  Admit HPI / Brief Narrative: 70 year old WF PMHx Fibromyalgia, Chronic Pain Syndrome, Opioid Induced Constipation, Chronic Respiratory Failure on 2 L O2 via [place], Obesity Hypoventilation Syndrome, Chronic Diastolic CHF, HTN, Microalbuminuria in setting DM Type 2, Hypothyroidism, Chronic Thrombocytopenia, Chronic Closed Right Trimalleolar Fracture with splint and Morbid Obesity  Presented to the ER after becoming dizzy and lightheaded and falling out of her wheelchair and striking her head against the dresser. She has been having issues with progressive dyspnea. She was recently evaluated by her cardiologist on 1/3 at which point her Lasix was increased from 40 to 60 mg by mouth twice a day. During that evaluation patient was found to be hypoxemic with saturation 77% on 3 L pulse oxygen therapy. She was transitioned over to 2 L continuous oxygen and had improved saturations greater than 90 at that time. Patient reports that after increase in Lasix dosage she has not had any significant increase in urinary output or improvement in her symptoms. She's been having issues with palpitations recently and has an event monitor in place. Patient has noticed now she must sleep sitting up in the chair, increasing edema in her lower extremities, she has not had any constant on 2 additional symptoms such as fever or cough or abdominal pain. She did have some vomiting last night. For several days she has felt chilled. Patient reports she's had difficulty eating because of shortness of breath.  HPI/Subjective: 1/19 A/O x 4 Still feels a little short of breath but improved. Does not know base  Weight.    Assessment/Plan: Acute on chronic hypoxic respiratory failure due   -Most likely multifactorial to include acute on chronic diastolic CHF, COPD, obesity hypoventilation  syndrome -grossly volume overloaded on exam    COPD w/ Obesity hypoventilation syndrome -no evidence of bronchospastic exacerbation at present  Fibromyalgia/Chronic pain syndrome -cont usual home meds - appears to be controlled   DM2 w/ Microalbuminuria  -Not on ACE or ARB secondary to acute kidney injury June 2016 - A1c 6.0 - follow CBG   Chronic Diastolic CHF (preserved EF with dilated LA) -Bisoprolol 5mg  Daily  -Lasix 80mg  QID -Hydralazine 10mg  QID -Strict in and out since admission - 6.3 L -Daily weight                   1/19 bed weight= 101.8 kg  HTN  -very poorly controlled presently,  -See chronic diastolic CHF  Opioid induced constipation -Continue Linzess 145 g daily  Multinodular goiter / Hypothyroidism -Continue Synthroid -TSH December 2016 1.27  Closed right trimalleolar fracture -Continue ankle splint  Chronic Thrombocytopenia -Continue to monitor closely   Morbid obesity - Body mass index is 41.68 kg/(m^2).   Code Status: FULL Family Communication: no family present at time of exam Disposition Plan: SNF vs CIR    Consultants: NA  Procedure/Significant Events: 1/17 echocardiogram;- Left ventricle:  mild LVH. -60% to 65%.  Left atrium: moderately dilated. - Pericardium, extracardiac: A trivial pericardial effusion posterior to the heart.    Culture NA  Antibiotics: NA  DVT prophylaxis: Lovenox   Devices NA   LINES / TUBES:  NA    Continuous Infusions:   Objective: VITAL SIGNS: Temp: 98.6 F (37 C) (01/19 0737) Temp Source: Oral (01/19 0737) BP: 191/54 mmHg (01/19 0749) Pulse Rate: 66 (01/19 0737) SPO2; FIO2:   Intake/Output Summary (Last 24 hours) at  05/12/15 0826 Last data filed at 05/12/15 0630  Gross per 24 hour  Intake    843 ml  Output   4200 ml  Net  -3357 ml     Exam: General:  A/O x 4 Still feels a little short of breath but improved., Acute on Chronic respiratory distress Eyes: Negative headache,  double vision,negative scleral hemorrhage ENT: Negative Runny nose, negative gingival bleeding, Neck:  Negative scars, masses, torticollis, lymphadenopathy, JVD Lungs: Mild Bibasilar crackles Cardiovascular: Regular rate and rhythm without murmur gallop or rub normal S1 and S2 Abdomen:negative abdominal pain, nondistended, positive soft, bowel sounds, no rebound, no ascites, no appreciable mass Extremities: No significant cyanosis, clubbing. Positive bilateral lower extremities Edema 1-2 +  Psychiatric:  Negative depression, negative anxiety, negative fatigue, negative mania Neurologic:  Cranial nerves II through XII intact, tongue/uvula midline, all extremities muscle strength 5/5, sensation intact throughout, negative dysarthria, negative expressive aphasia, negative receptive aphasia.   Data Reviewed: Basic Metabolic Panel:  Recent Labs Lab 05/10/15 0845 05/11/15 0458 05/12/15 0506  NA 135 139 144  K 4.3 4.1 3.6  CL 93* 95* 98*  CO2 33* 37* 39*  GLUCOSE 92 102* 53*  BUN 44* 39* 37*  CREATININE 0.80 0.90 0.82  CALCIUM 8.7* 8.7* 9.0   Liver Function Tests:  Recent Labs Lab 05/12/15 0506  AST 22  ALT 25  ALKPHOS 97  BILITOT 0.7  PROT 6.3*  ALBUMIN 2.8*   No results for input(s): LIPASE, AMYLASE in the last 168 hours. No results for input(s): AMMONIA in the last 168 hours. CBC:  Recent Labs Lab 05/10/15 0845 05/12/15 0506  WBC 8.6 8.4  NEUTROABS 7.5  --   HGB 10.0* 9.5*  HCT 31.8* 31.1*  MCV 81.5 82.3  PLT 99* 115*   Cardiac Enzymes: No results for input(s): CKTOTAL, CKMB, CKMBINDEX, TROPONINI in the last 168 hours. BNP (last 3 results)  Recent Labs  08/02/14 1647 09/29/14 1155 05/10/15 0845  BNP 56.8 48.0 322.7*    ProBNP (last 3 results) No results for input(s): PROBNP in the last 8760 hours.  CBG:  Recent Labs Lab 05/10/15 2227 05/11/15 0625 05/11/15 1127 05/11/15 1650 05/11/15 2106  GLUCAP 125* 72 127* 89 108*    Recent Results  (from the past 240 hour(s))  MRSA PCR Screening     Status: None   Collection Time: 05/11/15  3:02 PM  Result Value Ref Range Status   MRSA by PCR NEGATIVE NEGATIVE Final    Comment:        The GeneXpert MRSA Assay (FDA approved for NASAL specimens only), is one component of a comprehensive MRSA colonization surveillance program. It is not intended to diagnose MRSA infection nor to guide or monitor treatment for MRSA infections.      Studies:  Recent x-ray studies have been reviewed in detail by the Attending Physician  Scheduled Meds:  Scheduled Meds: . atorvastatin  40 mg Oral q1800  . bisoprolol  5 mg Oral Daily  . cholecalciferol  1,000 Units Oral Daily  . citalopram  20 mg Oral Daily  . clopidogrel  75 mg Oral Daily  . enoxaparin (LOVENOX) injection  40 mg Subcutaneous Daily  . furosemide  80 mg Intravenous Q6H  . insulin aspart  0-5 Units Subcutaneous QHS  . insulin aspart  0-9 Units Subcutaneous TID WC  . insulin glargine  20 Units Subcutaneous BID  . letrozole  2.5 mg Oral Daily  . levothyroxine  100 mcg Oral QAC breakfast  .  Linaclotide  145 mcg Oral Daily  . loratadine  10 mg Oral Daily  . magnesium oxide  800 mg Oral Daily  . morphine  30 mg Oral Q12H  . pantoprazole  40 mg Oral Daily  . pregabalin  150 mg Oral TID  . rOPINIRole  2 mg Oral BID    Time spent on care of this patient: 40 mins   Saphronia Ozdemir, Geraldo Docker , MD  [facility] Office  [phone] Pager - [phone]  On-Call/Text Page:      [email or web address]      password [email or web address]  If 7PM-7AM, please contact night-coverage [email or web address] Password [email or web address] 05/12/2015, 8:26 AM   LOS: 2 days   Care during the described time interval was provided by me .  I have reviewed this patient's available data, including medical history, events of note, physical examination, and all test results as part of my evaluation. I have personally reviewed and interpreted all radiology studies.   Dia Crawford,  MD [phone] Pager

## 2015-05-12 NOTE — Telephone Encounter (Signed)
Please advise. Thanks.  

## 2015-05-13 LAB — GLUCOSE, CAPILLARY
GLUCOSE-CAPILLARY: 96 mg/dL (ref 65–99)
GLUCOSE-CAPILLARY: 114 mg/dL — AB (ref 65–99)
Glucose-Capillary: 122 mg/dL — ABNORMAL HIGH (ref 65–99)
Glucose-Capillary: 138 mg/dL — ABNORMAL HIGH (ref 65–99)

## 2015-05-13 LAB — BASIC METABOLIC PANEL
ANION GAP: 7 (ref 5–15)
BUN: 38 mg/dL — AB (ref 6–20)
CALCIUM: 8.6 mg/dL — AB (ref 8.9–10.3)
CO2: 40 mmol/L — ABNORMAL HIGH (ref 22–32)
CREATININE: 0.9 mg/dL (ref 0.44–1.00)
Chloride: 95 mmol/L — ABNORMAL LOW (ref 101–111)
Glucose, Bld: 105 mg/dL — ABNORMAL HIGH (ref 65–99)
POTASSIUM: 3.8 mmol/L (ref 3.5–5.1)
SODIUM: 142 mmol/L (ref 135–145)

## 2015-05-13 MED ORDER — FUROSEMIDE 10 MG/ML IJ SOLN
80.0000 mg | Freq: Three times a day (TID) | INTRAMUSCULAR | Status: DC
  Administered 2015-05-13 – 2015-05-14 (×3): 80 mg via INTRAVENOUS
  Filled 2015-05-13 (×3): qty 8

## 2015-05-13 MED ORDER — HYDRALAZINE HCL 50 MG PO TABS
50.0000 mg | ORAL_TABLET | Freq: Three times a day (TID) | ORAL | Status: DC
Start: 2015-05-13 — End: 2015-05-16
  Administered 2015-05-13 – 2015-05-16 (×9): 50 mg via ORAL
  Filled 2015-05-13 (×9): qty 1

## 2015-05-13 MED ORDER — ISOSORBIDE DINITRATE 10 MG PO TABS
10.0000 mg | ORAL_TABLET | Freq: Three times a day (TID) | ORAL | Status: DC
  Administered 2015-05-13 – 2015-05-16 (×10): 10 mg via ORAL
  Filled 2015-05-13 (×12): qty 1

## 2015-05-13 NOTE — Progress Notes (Signed)
[place] TEAM 1 - Stepdown/ICU TEAM PROGRESS NOTE  Brooke Mccullough [id number] DOB: [birth year] DOA: 05/10/2015 PCP: Tammi Sou, MD  Admit HPI / Brief Narrative: 70 year old female with hx of COPD and obesity hypoventilation syndrome, recurrent exacerbations of grade 1 diastolic heart failure, hypertension, microalbuminuria in setting of diabetes, chronic pain syndrome with fibromyalgia, opioid-induced constipation, diabetes, hypothyroidism, chronic thrombocytopenia, chronic closed right trimalleolar fracture with splint, and morbid obesity. Patient presented to the ER after becoming dizzy and lightheaded and falling out of her wheelchair and striking her head against a dresser. She had been having issues with progressive dyspnea. She was evaluated by her Cardiologist on 1/3 at which point her Lasix was increased from 40 to 60 mg by mouth twice a day. During that evaluation patient was found to be hypoxemic with saturation 77% on 3 L pulse flow. She was transitioned over to 2 L continuous oxygen and had improved saturations greater than 90 at that time. Patient reports that after increase in Lasix dosage she has not had any significant increase in urinary output or improvement in her symptoms. She'd been having issues with palpitations and had an event monitor placed. Marland Kitchen  HPI/Subjective: The patient is resting comfortably in bed.  She does not appear to be in any acute respiratory distress.  She states she feels much better in general.  She denies fevers chills shortness of breath chest pain nausea or vomiting.  Assessment/Plan:  Acute on chronic hypoxic respiratory failure due to Acute on chronic diastolic congestive heart failure -Much improved with significant volume diuresis - continue to watch Is/Os and daily weights  -net negative ~8.5 L thus far - weights do not appear to be accurate  -ACE inhibitor discontinued in June secondary to acute kidney injury   HTN  -very poorly  controlled - not anxious presently - adjust medical tx and follow  COPD w/ Obesity hypoventilation syndrome -no evidence of bronchospastic exacerbation  Fibromyalgia/Chronic pain syndrome -cont usual home meds - appears to be controlled   DM2 w/ Microalbuminuria  -Not on ACE or ARB secondary to acute kidney injury June 2016 - A1c 6.0 - CBG reasonably controlled   Opioid induced constipation -tx and follow   Multinodular goiter / Hypothyroidism -Continue Synthroid -TSH December 2016 1.27  Closed right trimalleolar fracture -Continue ankle splint  Chronic Thrombocytopenia -Current platelets near baseline - now that > 100 resume/cont lovenox for DVT prophy  Morbid obesity - Body mass index is 39.75 kg/(m^2).  Code Status: FULL Family Communication: No family present at time of exam today Disposition Plan: SDU  Consultants: none   Procedures: 1/17 TTE - EF 60-65% - mild LVH - no WMA - LA mod dilated  Antibiotics: none  DVT prophylaxis: lovenox   Objective: Blood pressure 180/63, pulse 56, temperature 97.8 F (36.6 C), temperature source Oral, resp. rate 15, height 5\' 4"  (1.626 m), weight 105.1 kg (231 lb 11.3 oz), SpO2 100 %.  Intake/Output Summary (Last 24 hours) at 05/13/15 1045 Last data filed at 05/13/15 0800  Gross per 24 hour  Intake      0 ml  Output   4350 ml  Net  -4350 ml   Exam: General: No acute respiratory distress Lungs: poor air movement th/o all fields - no focal crackles or wheeze Cardiovascular: Regular rate and rhythm without appreciable murmur or gallop Abdomen: Nontender, obese, soft, bowel sounds positive, no rebound, no ascites, no appreciable mass Extremities: No significant cyanosis, or clubbing; 1+ edema bilateral lower extremities w/ changes  of chronic venous stasis   Data Reviewed:  Basic Metabolic Panel:  Recent Labs Lab 05/10/15 0845 05/11/15 0458 05/12/15 0506 05/13/15 0224  NA 135 139 144 142  K 4.3 4.1 3.6 3.8  CL  93* 95* 98* 95*  CO2 33* 37* 39* 40*  GLUCOSE 92 102* 53* 105*  BUN 44* 39* 37* 38*  CREATININE 0.80 0.90 0.82 0.90  CALCIUM 8.7* 8.7* 9.0 8.6*   CBC:  Recent Labs Lab 05/10/15 0845 05/12/15 0506  WBC 8.6 8.4  NEUTROABS 7.5  --   HGB 10.0* 9.5*  HCT 31.8* 31.1*  MCV 81.5 82.3  PLT 99* 115*    Liver Function Tests:  Recent Labs Lab 05/12/15 0506  AST 22  ALT 25  ALKPHOS 97  BILITOT 0.7  PROT 6.3*  ALBUMIN 2.8*   CBG:  Recent Labs Lab 05/12/15 0804 05/12/15 1202 05/12/15 1818 05/12/15 2120 05/13/15 0808  GLUCAP 131* 106* 93 162* 96    Studies:   Recent x-ray studies have been reviewed in detail by the Attending Physician  Scheduled Meds:  Scheduled Meds: . antiseptic oral rinse  7 mL Mouth Rinse BID  . atorvastatin  40 mg Oral q1800  . bisoprolol  5 mg Oral Daily  . cholecalciferol  1,000 Units Oral Daily  . citalopram  20 mg Oral Daily  . clopidogrel  75 mg Oral Daily  . enoxaparin (LOVENOX) injection  40 mg Subcutaneous Daily  . furosemide  80 mg Intravenous Q6H  . hydrALAZINE  10 mg Oral 4 times per day  . insulin aspart  0-5 Units Subcutaneous QHS  . insulin aspart  0-9 Units Subcutaneous TID WC  . insulin glargine  20 Units Subcutaneous BID  . letrozole  2.5 mg Oral Daily  . levothyroxine  100 mcg Oral QAC breakfast  . Linaclotide  145 mcg Oral Daily  . loratadine  10 mg Oral Daily  . magnesium oxide  800 mg Oral Daily  . morphine  30 mg Oral Q12H  . pantoprazole  40 mg Oral Daily  . pregabalin  150 mg Oral TID  . rOPINIRole  2 mg Oral BID    Time spent on care of this patient: 35 mins   Liberta Gimpel T , MD   [facility]  [phone] Pager - Text Page per Shea Evans as per below:  On-Call/Text Page:      [email or web address]      password [email or web address]  If 7PM-7AM, please contact night-coverage [email or web address] Password [email or web address] 05/13/2015, 10:45 AM   LOS: 3 days

## 2015-05-14 LAB — COMPREHENSIVE METABOLIC PANEL
ALBUMIN: 2.6 g/dL — AB (ref 3.5–5.0)
ALT: 20 U/L (ref 14–54)
ANION GAP: 10 (ref 5–15)
AST: 21 U/L (ref 15–41)
Alkaline Phosphatase: 100 U/L (ref 38–126)
BUN: 38 mg/dL — ABNORMAL HIGH (ref 6–20)
CHLORIDE: 92 mmol/L — AB (ref 101–111)
CO2: 39 mmol/L — AB (ref 22–32)
CREATININE: 0.96 mg/dL (ref 0.44–1.00)
Calcium: 8.7 mg/dL — ABNORMAL LOW (ref 8.9–10.3)
GFR calc non Af Amer: 59 mL/min — ABNORMAL LOW (ref >60)
GLUCOSE: 125 mg/dL — AB (ref 65–99)
Potassium: 3.9 mmol/L (ref 3.5–5.1)
SODIUM: 141 mmol/L (ref 135–145)
Total Bilirubin: 0.4 mg/dL (ref 0.3–1.2)
Total Protein: 5.9 g/dL — ABNORMAL LOW (ref 6.5–8.1)

## 2015-05-14 LAB — GLUCOSE, CAPILLARY
Glucose-Capillary: 89 mg/dL (ref 65–99)
Glucose-Capillary: 131 mg/dL — ABNORMAL HIGH (ref 65–99)
Glucose-Capillary: 118 mg/dL — ABNORMAL HIGH (ref 65–99)
Glucose-Capillary: 122 mg/dL — ABNORMAL HIGH (ref 65–99)

## 2015-05-14 LAB — CBC
HCT: 29.5 % — ABNORMAL LOW (ref 36.0–46.0)
HEMOGLOBIN: 9.1 g/dL — AB (ref 12.0–15.0)
MCH: 25.6 pg — AB (ref 26.0–34.0)
MCHC: 30.8 g/dL (ref 30.0–36.0)
MCV: 82.9 fL (ref 78.0–100.0)
Platelets: 106 10*3/uL — ABNORMAL LOW (ref 150–400)
RBC: 3.56 MIL/uL — AB (ref 3.87–5.11)
RDW: 14.4 % (ref 11.5–15.5)
WBC: 6.8 10*3/uL (ref 4.0–10.5)

## 2015-05-14 MED ORDER — FUROSEMIDE 80 MG PO TABS
80.0000 mg | ORAL_TABLET | Freq: Two times a day (BID) | ORAL | Status: DC
  Administered 2015-05-14 – 2015-05-16 (×4): 80 mg via ORAL
  Filled 2015-05-14 (×4): qty 1

## 2015-05-14 NOTE — Progress Notes (Signed)
[place] TEAM 1 - Stepdown/ICU TEAM PROGRESS NOTE  Brooke Mccullough [id number] DOB: Mar 29, 1946 DOA: 05/10/2015 PCP: Tammi Sou, MD  Admit HPI / Brief Narrative: 70 year old female with hx of COPD and obesity hypoventilation syndrome, recurrent exacerbations of grade 1 diastolic heart failure, hypertension, microalbuminuria in setting of diabetes, chronic pain syndrome with fibromyalgia, opioid-induced constipation, diabetes, hypothyroidism, chronic thrombocytopenia, chronic closed right trimalleolar fracture with splint, and morbid obesity. Patient presented to the ER after becoming dizzy and lightheaded and falling out of her wheelchair and striking her head against a dresser. She had been having issues with progressive dyspnea. She was evaluated by her Cardiologist on 1/3 at which point her Lasix was increased from 40 to 60 mg by mouth twice a day. During that evaluation patient was found to be hypoxemic with saturation 77% on 3 L pulse flow. She was transitioned over to 2 L continuous oxygen and had improved saturations greater than 90 at that time. Patient reports that after increase in Lasix dosage she has not had any significant increase in urinary output or improvement in her symptoms. She'd been having issues with palpitations and had an event monitor placed. Marland Kitchen  HPI/Subjective: The patient states she is feeling much better overall.  She denies current chest pain shortness breath fevers chills nausea or vomiting.  She has not yet worked with physical or occupational therapy.  Assessment/Plan:  Acute on chronic hypoxic respiratory failure due to Acute on Chronic diastolic congestive heart failure -Much improved with significant volume diuresis - continue to watch Is/Os and daily weights  -net negative ~11 L thus far - admit weight 110.2kg - presently 104.3kg -ACE inhibitor discontinued in June secondary to acute kidney injury -Transition to oral diuretics today and follow volume  status   HTN  -Not yet at goal but improving - follow without change in treatment plan today  COPD w/ Obesity hypoventilation syndrome -no evidence of bronchospastic exacerbation at this time  Fibromyalgia/Chronic pain syndrome -cont usual home meds - appears to be controlled   DM2 w/ Microalbuminuria  -Not on ACE or ARB secondary to acute kidney injury June 2016 - A1c 6.0 - CBG well controlled   Opioid induced constipation -Appears well managed at present  Multinodular goiter / Hypothyroidism -Continue Synthroid -TSH December 2016 1.27  Closed right trimalleolar fracture -Continue ankle splint - should not prevent the patient from working with therapy  Chronic Thrombocytopenia -Current platelets near baseline   Morbid obesity - Body mass index is 39.48 kg/(m^2).  Code Status: FULL Family Communication: No family present at time of exam today Disposition Plan: Transfer to medical bed - transition to oral diuretic - begin PT/OT - suspicious that SNF placement may be necessary for rehabilitation  Consultants: none   Procedures: 1/17 TTE - EF 60-65% - mild LVH - no WMA - LA mod dilated  Antibiotics: none  DVT prophylaxis: lovenox   Objective: Blood pressure 165/71, pulse 68, temperature 97.9 F (36.6 C), temperature source Oral, resp. rate 15, height 5\' 4"  (1.626 m), weight 104.373 kg (230 lb 1.6 oz), SpO2 95 %.  Intake/Output Summary (Last 24 hours) at 05/14/15 1000 Last data filed at 05/14/15 0600  Gross per 24 hour  Intake    720 ml  Output   3400 ml  Net  -2680 ml   Exam: General: No acute respiratory distress at rest in bed Lungs: poor air movement th/o all fields / distant breath sounds- no focal crackles or wheeze Cardiovascular: Regular rate and rhythm -  no murmur or gallop Abdomen: Nontender, obese, soft, bowel sounds positive, no rebound, no ascites, no appreciable mass Extremities: No significant cyanosis, or clubbing; 1+ edema bilateral lower  extremities persists w/ changes of chronic venous stasis   Data Reviewed:  Basic Metabolic Panel:  Recent Labs Lab 05/10/15 0845 05/11/15 0458 05/12/15 0506 05/13/15 0224 05/14/15 0302  NA 135 139 144 142 141  K 4.3 4.1 3.6 3.8 3.9  CL 93* 95* 98* 95* 92*  CO2 33* 37* 39* 40* 39*  GLUCOSE 92 102* 53* 105* 125*  BUN 44* 39* 37* 38* 38*  CREATININE 0.80 0.90 0.82 0.90 0.96  CALCIUM 8.7* 8.7* 9.0 8.6* 8.7*   CBC:  Recent Labs Lab 05/10/15 0845 05/12/15 0506 05/14/15 0302  WBC 8.6 8.4 6.8  NEUTROABS 7.5  --   --   HGB 10.0* 9.5* 9.1*  HCT 31.8* 31.1* 29.5*  MCV 81.5 82.3 82.9  PLT 99* 115* 106*    Liver Function Tests:  Recent Labs Lab 05/12/15 0506 05/14/15 0302  AST 22 21  ALT 25 20  ALKPHOS 97 100  BILITOT 0.7 0.4  PROT 6.3* 5.9*  ALBUMIN 2.8* 2.6*   CBG:  Recent Labs Lab 05/13/15 0808 05/13/15 1128 05/13/15 1638 05/13/15 2102 05/14/15 0853  GLUCAP 96 114* 122* 138* 89    Studies:   Recent x-ray studies have been reviewed in detail by the Attending Physician  Scheduled Meds:  Scheduled Meds: . antiseptic oral rinse  7 mL Mouth Rinse BID  . atorvastatin  40 mg Oral q1800  . bisoprolol  5 mg Oral Daily  . cholecalciferol  1,000 Units Oral Daily  . citalopram  20 mg Oral Daily  . clopidogrel  75 mg Oral Daily  . enoxaparin (LOVENOX) injection  40 mg Subcutaneous Daily  . furosemide  80 mg Intravenous 3 times per day  . hydrALAZINE  50 mg Oral 3 times per day  . insulin aspart  0-5 Units Subcutaneous QHS  . insulin aspart  0-9 Units Subcutaneous TID WC  . insulin glargine  20 Units Subcutaneous BID  . isosorbide dinitrate  10 mg Oral TID  . letrozole  2.5 mg Oral Daily  . levothyroxine  100 mcg Oral QAC breakfast  . Linaclotide  145 mcg Oral Daily  . loratadine  10 mg Oral Daily  . magnesium oxide  800 mg Oral Daily  . morphine  30 mg Oral Q12H  . pantoprazole  40 mg Oral Daily  . pregabalin  150 mg Oral TID  . rOPINIRole  2 mg Oral  BID    Time spent on care of this patient: 35 mins   Darlette Dubow T , MD   [facility]  [phone] Pager - Text Page per Shea Evans as per below:  On-Call/Text Page:      [email or web address]      password [email or web address]  If 7PM-7AM, please contact night-coverage [email or web address] Password [email or web address] 05/14/2015, 10:00 AM   LOS: 4 days

## 2015-05-14 NOTE — Evaluation (Addendum)
Physical Therapy Evaluation Patient Details Name: Brooke Mccullough MRN: [id number] DOB: 23-May-1945 Today's Date: 05/14/2015   History of Present Illness  70 year old female with hx of COPD and obesity hypoventilation syndrome, recurrent exacerbations of grade 1 diastolic heart failure, hypertension, microalbuminuria in setting of diabetes, chronic pain syndrome with fibromyalgia, opioid-induced constipation, diabetes, hypothyroidism, chronic thrombocytopenia, chronic closed right trimalleolar fracture with splint, and morbid obesity. Patient presented to the ER after becoming dizzy and lightheaded and falling out of her wheelchair and striking her head against a dresser. She had been having issues with progressive dyspnea. She was evaluated by her Cardiologist on 1/3 at which point her Lasix was increased from 40 to 60 mg by mouth twice a day. During that evaluation patient was found to be hypoxemic with saturation 77% on 3 L pulse flow. She was transitioned over to 2 L continuous oxygen and had improved saturations greater than 90 at that time. Patient reports that after increase in Lasix dosage she has not had any significant increase in urinary output or improvement in her symptoms. She'd been having issues with palpitations and had an event monitor placed. .  Clinical Impression  Pt admitted with above diagnosis. Pt currently with functional limitations due to the deficits listed below (see PT Problem List). Pt was able to stand and pivot to recliner from bed with min assist.  Right Ankle limits pt with pt needing to work on balance training and endurance as well.  Pt will need to go to Rehab section of [facility] at d/c prior to going back to I living.  Will follow acutely.  Pt will benefit from skilled PT to increase their independence and safety with mobility to allow discharge to the venue listed below.      Follow Up Recommendations SNF;Supervision/Assistance - 24 hour    Equipment  Recommendations  None recommended by PT    Recommendations for Other Services       Precautions / Restrictions Precautions Precautions: Fall Required Braces or Orthoses: Other Brace/Splint (right ankle brace) Restrictions Weight Bearing Restrictions: No      Mobility  Bed Mobility Overal bed mobility: Needs Assistance Bed Mobility: Supine to Sit     Supine to sit: Min guard     General bed mobility comments: assist for elevation of trunk and took incr time for pt to come to EOb.   Transfers Overall transfer level: Needs assistance Equipment used: Rolling walker (2 wheeled) Transfers: Sit to/from [facility] Sit to Stand: Min assist;+2 physical assistance Stand pivot transfers: Min assist;+2 physical assistance       General transfer comment: Pt needed a little asssit to power up.  Pt was able to take pivotal steps around to chair once up with RW.  Definite need for UE support on RW.  Pt reports right ankle pain bothering her with pivot transfer  Ambulation/Gait                Stairs            Wheelchair Mobility    Modified Rankin (Stroke Patients Only)       Balance Overall balance assessment: Needs assistance;History of Falls Sitting-balance support: No upper extremity supported;Feet supported Sitting balance-Leahy Scale: Fair     Standing balance support: Bilateral upper extremity supported;During functional activity Standing balance-Leahy Scale: Poor Standing balance comment: Relies on UE support of RW.  Pertinent Vitals/Pain Pain Assessment: No/denies pain  VSS with Hr 75 bpm, 93% on 4LO2, 158//47.  Pt has been on 3-4 LO2 at home for last 5-7 years per pt report.    Home Living Family/patient expects to be discharged to:: Private residence Living Arrangements: Spouse/significant other Available Help at Discharge: Family;Available 24 hours/day;Personal care attendant (had aide 5  days a week for 4-6 hours) Type of Home: Independent living facility [facility]) Home Access: Level entry     Home Layout: One level Home Equipment: Walker - 2 wheels;Grab bars - toilet;Grab bars - tub/shower;Shower seat - built in;Wheelchair Hotel manager - power;Bedside commode      Prior Function Level of Independence: Independent with assistive device(s);Needs assistance         Comments: pt has walk in shower with built in seat and handicap bars around toilet and shower with elevated toilet; did not walk in house, used the RW for transfers, used WC for long distances     Hand Dominance   Dominant Hand: Right    Extremity/Trunk Assessment   Upper Extremity Assessment: Defer to OT evaluation           Lower Extremity Assessment: Generalized weakness         Communication   Communication: No difficulties  Cognition Arousal/Alertness: Awake/alert Behavior During Therapy: WFL for tasks assessed/performed Overall Cognitive Status: Within Functional Limits for tasks assessed                      General Comments General comments (skin integrity, edema, etc.): bil LE edema.    Exercises General Exercises - Lower Extremity Ankle Circles/Pumps: AROM;Both;10 reps;Supine Long Arc Quad: AROM;Both;10 reps;Supine Hip Flexion/Marching: AROM;Both;10 reps;Seated      Assessment/Plan    PT Assessment Patient needs continued PT services  PT Diagnosis Generalized weakness   PT Problem List Decreased activity tolerance;Decreased balance;Decreased mobility;Decreased knowledge of use of DME;Decreased safety awareness;Decreased knowledge of precautions;Pain;Decreased strength  PT Treatment Interventions DME instruction;Gait training;Functional mobility training;Therapeutic activities;Therapeutic exercise;Balance training;Patient/family education   PT Goals (Current goals can be found in the Care Plan section) Acute Rehab PT Goals Patient Stated Goal: to  get stronger PT Goal Formulation: With patient Time For Goal Achievement: 05/28/15 Potential to Achieve Goals: Good    Frequency Min 3X/week   Barriers to discharge Decreased caregiver support (husband needs care as well)      Co-evaluation               End of Session Equipment Utilized During Treatment: Gait belt;Oxygen Activity Tolerance: Patient limited by fatigue Patient left: in chair;with call bell/phone within reach;with chair alarm set Nurse Communication: Mobility status         Time: [id number] PT Time Calculation (min) (ACUTE ONLY): 15 min   Charges:   PT Evaluation $PT Eval Moderate Complexity: 1 Procedure     PT G CodesDenice Paradise 2015/05/17, 2:39 PM [facility] Acute Rehabilitation [phone] (pager)

## 2015-05-14 NOTE — Progress Notes (Signed)
Pt transfer from 2 heart alert and oriented with 02nc at 4 liters, with continous Holter Monitor, foley cath and left AC peripheral IV line, no s/s of distress noted, will call her husband tomorrow about the transfer.

## 2015-05-15 DIAGNOSIS — I5033 Acute on chronic diastolic (congestive) heart failure: Principal | ICD-10-CM

## 2015-05-15 LAB — BASIC METABOLIC PANEL
ANION GAP: 9 (ref 5–15)
BUN: 32 mg/dL — ABNORMAL HIGH (ref 6–20)
CALCIUM: 8.7 mg/dL — AB (ref 8.9–10.3)
CHLORIDE: 91 mmol/L — AB (ref 101–111)
CO2: 41 mmol/L — AB (ref 22–32)
Creatinine, Ser: 0.85 mg/dL (ref 0.44–1.00)
GFR calc Af Amer: >60 mL/min (ref >60)
GFR calc non Af Amer: >60 mL/min (ref >60)
GLUCOSE: 130 mg/dL — AB (ref 65–99)
POTASSIUM: 4 mmol/L (ref 3.5–5.1)
Sodium: 141 mmol/L (ref 135–145)

## 2015-05-15 LAB — GLUCOSE, CAPILLARY
GLUCOSE-CAPILLARY: 90 mg/dL (ref 65–99)
GLUCOSE-CAPILLARY: 165 mg/dL — AB (ref 65–99)
GLUCOSE-CAPILLARY: 111 mg/dL — AB (ref 65–99)
Glucose-Capillary: 157 mg/dL — ABNORMAL HIGH (ref 65–99)

## 2015-05-15 LAB — MAGNESIUM: Magnesium: 2 mg/dL (ref 1.7–2.4)

## 2015-05-15 NOTE — Clinical Social Work Note (Signed)
Clinical Social Work Assessment  Patient Details  Name: Brooke Mccullough MRN: [id number] Date of Birth: 1945-04-25  Date of referral:  05/15/15               Reason for consult:  Facility Placement                Permission sought to share information with:  Facility Sport and exercise psychologist, Family Supports Permission granted to share information::  Yes, Verbal Permission Granted  Name::     husband  Agency::  [facility]  Relationship::     Contact Information:     Housing/Transportation Living arrangements for the past 2 months:  [place] of Information:  Patient Patient Interpreter Needed:  None Criminal Activity/Legal Involvement Pertinent to Current Situation/Hospitalization:  No - Comment as needed Significant Relationships:  Spouse Lives with:  Spouse Do you feel safe going back to the place where you live?  Yes Need for family participation in patient care:  No (Coment)  Care giving concerns:  None, at this time.   Social Worker assessment / plan:  Pt lives at [facility] with her husband.  She is wanting to go to rehab at the facility's SNF, [facility].  She stated that the SNF is aware that she will need placement and that she has been there before.  Employment status:  Retired Forensic scientist:  Medicare PT Recommendations:    Information / Referral to community resources:     Patient/Family's Response to care:  Pt seemed pleased with the care she's currently receiving.  Patient/Family's Understanding of and Emotional Response to Diagnosis, Current Treatment, and Prognosis:  Pt is agreeable to SNF.  Emotional Assessment Appearance:    Attitude/Demeanor/Rapport:    Affect (typically observed):    Orientation:    Alcohol / Substance use:    Psych involvement (Current and /or in the community):     Discharge Needs  Concerns to be addressed:    Readmission within the last 30 days:    Current  discharge risk:    Barriers to Discharge:      Matilde Bash, [place] 05/15/2015, 4:24 PM

## 2015-05-15 NOTE — Progress Notes (Signed)
[place] TEAM 1 - Stepdown/ICU TEAM PROGRESS NOTE  Brooke Mccullough [id number] DOB: 1946/03/05 DOA: 05/10/2015 PCP: Tammi Sou, MD  Admit HPI / Brief Narrative: 70 year old female with hx of COPD and obesity hypoventilation syndrome, recurrent exacerbations of grade 1 diastolic heart failure, hypertension, microalbuminuria in setting of diabetes, chronic pain syndrome with fibromyalgia, opioid-induced constipation, diabetes, hypothyroidism, chronic thrombocytopenia, chronic closed right trimalleolar fracture with splint, and morbid obesity. Patient presented to the ER after becoming dizzy and lightheaded and falling out of her wheelchair and striking her head against a dresser. She had been having issues with progressive dyspnea. She was evaluated by her Cardiologist on 1/3 at which point her Lasix was increased from 40 to 60 mg by mouth twice a day. During that evaluation patient was found to be hypoxemic with saturation 77% on 3 L pulse flow. She was transitioned over to 2 L continuous oxygen and had improved saturations greater than 90 at that time. Patient reports that after increase in Lasix dosage she has not had any significant increase in urinary output or improvement in her symptoms. She'd been having issues with palpitations and had an event monitor placed. Marland Kitchen  HPI/Subjective: The patient states she is feeling much better overall.  She denies current chest pain shortness breath fevers chills nausea or vomiting.  She has not yet worked with physical or occupational therapy.  Assessment/Plan:  Acute on chronic hypoxic respiratory failure due to Acute on Chronic diastolic congestive heart failure -Much improved with significant volume diuresis - continue to watch Is/Os and daily weights  -net negative ~13 L thus far  -ACE inhibitor discontinued in June secondary to acute kidney injury -Observe on oral diuretics today.  COPD w/ Obesity hypoventilation syndrome -no evidence of  bronchospastic exacerbation at this time  Fibromyalgia/Chronic pain syndrome -cont usual home meds - appears to be controlled   DM2 w/ Microalbuminuria  -Not on ACE or ARB secondary to acute kidney injury June 2016 - A1c 6.0 - CBG well controlled   Opioid induced constipation -Appears well managed at present  Multinodular goiter / Hypothyroidism -Continue Synthroid -TSH December 2016 1.27  Closed right trimalleolar fracture -Continue ankle splint - should not prevent the patient from working with therapy  Chronic Thrombocytopenia -Current platelets near baseline   Morbid obesity - Body mass index is 40.71 kg/(m^2).  Code Status: FULL Family Communication: Husband and son at bedside Disposition Plan: SNF placement in a.m.  Consultants: none   Procedures: 1/17 TTE - EF 60-65% - mild LVH - no WMA - LA mod dilated  Antibiotics: none  DVT prophylaxis: lovenox   Objective: Blood pressure 173/54, pulse 60, temperature 97.8 F (36.6 C), temperature source Oral, resp. rate 18, height 5\' 4"  (1.626 m), weight 107.639 kg (237 lb 4.8 oz), SpO2 99 %.  Intake/Output Summary (Last 24 hours) at 05/15/15 1600 Last data filed at 05/15/15 1147  Gross per 24 hour  Intake    960 ml  Output   2350 ml  Net  -1390 ml   Exam: General: No acute respiratory distress at rest in bed Lungs: poor air movement th/o all fields / distant breath sounds- no focal crackles or wheeze Cardiovascular: Regular rate and rhythm - no murmur or gallop Abdomen: Nontender, obese, soft, bowel sounds positive, no rebound, no ascites, no appreciable mass Extremities: No significant cyanosis, or clubbing; 1+ edema bilateral lower extremities persists w/ changes of chronic venous stasis   Data Reviewed:  Basic Metabolic Panel:  Recent Labs Lab 05/11/15  [id number] 05/12/15 0506 05/13/15 0224 05/14/15 0302 05/15/15 0443  NA 139 144 142 141 141  K 4.1 3.6 3.8 3.9 4.0  CL 95* 98* 95* 92* 91*  CO2 37* 39* 40*  39* 41*  GLUCOSE 102* 53* 105* 125* 130*  BUN 39* 37* 38* 38* 32*  CREATININE 0.90 0.82 0.90 0.96 0.85  CALCIUM 8.7* 9.0 8.6* 8.7* 8.7*  MG  --   --   --   --  2.0   CBC:  Recent Labs Lab 05/10/15 0845 05/12/15 0506 05/14/15 0302  WBC 8.6 8.4 6.8  NEUTROABS 7.5  --   --   HGB 10.0* 9.5* 9.1*  HCT 31.8* 31.1* 29.5*  MCV 81.5 82.3 82.9  PLT 99* 115* 106*    Liver Function Tests:  Recent Labs Lab 05/12/15 0506 05/14/15 0302  AST 22 21  ALT 25 20  ALKPHOS 97 100  BILITOT 0.7 0.4  PROT 6.3* 5.9*  ALBUMIN 2.8* 2.6*   CBG:  Recent Labs Lab 05/14/15 1248 05/14/15 1708 05/14/15 2134 05/15/15 0734 05/15/15 1126  GLUCAP 131* 118* 122* 90 165*    Scheduled Meds:  Scheduled Meds: . antiseptic oral rinse  7 mL Mouth Rinse BID  . atorvastatin  40 mg Oral q1800  . bisoprolol  5 mg Oral Daily  . cholecalciferol  1,000 Units Oral Daily  . citalopram  20 mg Oral Daily  . clopidogrel  75 mg Oral Daily  . enoxaparin (LOVENOX) injection  40 mg Subcutaneous Daily  . furosemide  80 mg Oral BID  . hydrALAZINE  50 mg Oral 3 times per day  . insulin aspart  0-5 Units Subcutaneous QHS  . insulin aspart  0-9 Units Subcutaneous TID WC  . insulin glargine  20 Units Subcutaneous BID  . isosorbide dinitrate  10 mg Oral TID  . letrozole  2.5 mg Oral Daily  . levothyroxine  100 mcg Oral QAC breakfast  . Linaclotide  145 mcg Oral Daily  . loratadine  10 mg Oral Daily  . magnesium oxide  800 mg Oral Daily  . morphine  30 mg Oral Q12H  . pantoprazole  40 mg Oral Daily  . pregabalin  150 mg Oral TID  . rOPINIRole  2 mg Oral BID    Time spent on care of this patient: 25 mins   ELGERGAWY, DAWOOD , MD  [phone]  [facility] Office  [phone] Pager - Text Page per Amion as per below:  On-Call/Text Page:      [email or web address]      password [email or web address]  If 7PM-7AM, please contact night-coverage [email or web address] Password [email or web address] 05/15/2015, 4:00 PM   LOS: 5 days

## 2015-05-15 NOTE — NC FL2 (Signed)
[place] LEVEL OF CARE SCREENING TOOL     IDENTIFICATION  Patient Name: Brooke Mccullough Birthdate: 1946/03/30 Sex: female Admission Date (Current Location): 05/10/2015  [facility] and [place] Number:  Herbalist and Address:  [facility], [place] [address], [place], [place] [address]      Provider Number: [id number]  Attending Physician Name and Address:  Albertine Patricia, MD  Relative Name and Phone Number:       Current Level of Care: SNF Recommended Level of Care: Nursing Facility Prior Approval Number:    Date Approved/Denied:   PASRR Number:    Discharge Plan: SNF    Current Diagnoses: Patient Active Problem List   Diagnosis Date Noted  . Acute on chronic respiratory failure with hypoxia ([place]) 05/10/2015  . Therapeutic opioid induced constipation 05/10/2015  . Acute diastolic heart failure, NYHA class 1 ([place]) 05/10/2015  . Impaired mobility and ADLs 02/04/2015  . Osteoarthritis (arthritis due to wear and tear of joints)   . Obesity hypoventilation syndrome ([place])   . Morbid obesity ([place]) 11/13/2014  . Dyspnea 11/13/2014  . Type II diabetes mellitus with complication ([place]) [id number]  . Chronic respiratory failure with hypercapnia ([place]) 09/30/2014  . Weakness 09/29/2014  . Diabetes mellitus type 2, insulin dependent ([place])   . Ankle fracture, right   . Acute on chronic diastolic congestive heart failure, NYHA class 1 ([place])   . Thrombocytopenia (chronic)   . Absolute anemia   . COPD GOLD IV criteria but only if use FEV1/VC = 65%  02/12/2014  . Closed right trimalleolar fracture 02/11/2014  . Preventative health care 01/19/2014  . HTN (hypertension), benign 01/19/2014  . Hypothyroidism 12/21/2013  . Microalbuminuria due to type 2 diabetes mellitus ([place]) 12/16/2013  . Peripheral edema 11/15/2013  . Eustachian tube dysfunction 11/15/2013  . Chronic renal insufficiency, stage III (moderate)   . Osteoarthritis of  right knee 09/30/2013  . Elevated transaminase level 09/30/2013  . Chronic pain syndrome 09/30/2013  . Multinodular goiter (nontoxic) 09/30/2013  . Fibromyalgia 09/28/2013    Orientation RESPIRATION BLADDER Height & Weight    Self, Time, Situation, Place  Normal, O2 (3L) Incontinent      BEHAVIORAL SYMPTOMS/MOOD NEUROLOGICAL BOWEL NUTRITION STATUS      Continent  (heart)  AMBULATORY STATUS COMMUNICATION OF NEEDS Skin   Limited Assist Verbally Normal                       Personal Care Assistance Level of Assistance  Bathing, Dressing Bathing Assistance: Limited assistance   Dressing Assistance: Limited assistance     Functional Limitations Info             SPECIAL CARE FACTORS FREQUENCY                       Contractures      Additional Factors Info  Insulin Sliding Scale, Allergies       Insulin Sliding Scale Info: 4x per day       Current Medications (05/15/2015):  This is the current hospital active medication list Current Facility-Administered Medications  Medication Dose Route Frequency Provider Last Rate Last Dose  . acetaminophen (TYLENOL) tablet 650 mg  650 mg Oral Q4H PRN Samella Parr, NP      . ALPRAZolam Duanne Moron) tablet 0.5 mg  0.5 mg Oral BID PRN Samella Parr, NP   0.5 mg at 05/14/15 2145  . antiseptic  oral rinse (CPC / CETYLPYRIDINIUM CHLORIDE 0.05%) solution 7 mL  7 mL Mouth Rinse BID Allie Bossier, MD   7 mL at 05/15/15 1000  . atorvastatin (LIPITOR) tablet 40 mg  40 mg Oral q1800 Samella Parr, NP   40 mg at 05/14/15 1750  . bisoprolol (ZEBETA) tablet 5 mg  5 mg Oral Daily Samella Parr, NP   5 mg at 05/15/15 1006  . cholecalciferol (VITAMIN D) tablet 1,000 Units  1,000 Units Oral Daily Samella Parr, NP   1,000 Units at 05/15/15 1007  . citalopram (CELEXA) tablet 20 mg  20 mg Oral Daily Samella Parr, NP   20 mg at 05/15/15 1007  . clopidogrel (PLAVIX) tablet 75 mg  75 mg Oral Daily Samella Parr, NP   75 mg at  05/15/15 1006  . enoxaparin (LOVENOX) injection 40 mg  40 mg Subcutaneous Daily Cherene Altes, MD   40 mg at 05/15/15 1005  . furosemide (LASIX) tablet 80 mg  80 mg Oral BID Cherene Altes, MD   80 mg at 05/15/15 1004  . hydrALAZINE (APRESOLINE) tablet 50 mg  50 mg Oral 3 times per day Cherene Altes, MD   50 mg at 05/15/15 1400  . HYDROcodone-acetaminophen (NORCO) 7.5-325 MG per tablet 1 tablet  1 tablet Oral Q8H PRN Samella Parr, NP   1 tablet at 05/15/15 1340  . insulin aspart (novoLOG) injection 0-5 Units  0-5 Units Subcutaneous QHS Samella Parr, NP   0 Units at 05/10/15 2200  . insulin aspart (novoLOG) injection 0-9 Units  0-9 Units Subcutaneous TID WC Samella Parr, NP   2 Units at 05/15/15 1341  . insulin glargine (LANTUS) injection 20 Units  20 Units Subcutaneous BID Cherene Altes, MD   20 Units at 05/15/15 1029  . isosorbide dinitrate (ISORDIL) tablet 10 mg  10 mg Oral TID Cherene Altes, MD   10 mg at 05/15/15 1029  . letrozole [facility]) tablet 2.5 mg  2.5 mg Oral Daily Samella Parr, NP   2.5 mg at 05/15/15 1028  . levothyroxine (SYNTHROID, LEVOTHROID) tablet 100 mcg  100 mcg Oral QAC breakfast Samella Parr, NP   100 mcg at 05/15/15 [id number]  . Linaclotide (LINZESS) capsule 145 mcg  145 mcg Oral Daily Samella Parr, NP   145 mcg at 05/15/15 1028  . loratadine (CLARITIN) tablet 10 mg  10 mg Oral Daily Samella Parr, NP   10 mg at 05/15/15 1005  . magnesium oxide (MAG-OX) tablet 800 mg  800 mg Oral Daily Samella Parr, NP   800 mg at 05/15/15 1007  . meclizine (ANTIVERT) tablet 12.5 mg  12.5 mg Oral TID PRN Samella Parr, NP      . morphine (MS CONTIN) 12 hr tablet 30 mg  30 mg Oral Q12H Samella Parr, NP   30 mg at 05/15/15 1005  . ondansetron (ZOFRAN) injection 4 mg  4 mg Intravenous Q6H PRN Samella Parr, NP   4 mg at 05/15/15 1115  . pantoprazole (PROTONIX) EC tablet 40 mg  40 mg Oral Daily Samella Parr, NP   40 mg at 05/15/15 1005  . pregabalin  (LYRICA) capsule 150 mg  150 mg Oral TID Samella Parr, NP   150 mg at 05/15/15 1004  . rOPINIRole (REQUIP) tablet 2 mg  2 mg Oral BID Samella Parr, NP   2 mg at 05/15/15 1004  Discharge Medications: Please see discharge summary for a list of discharge medications.  Relevant Imaging Results:  Relevant Lab Results:   Additional Information    Moshe Cipro Berneice Heinrich, LCSW

## 2015-05-16 LAB — GLUCOSE, CAPILLARY
GLUCOSE-CAPILLARY: 78 mg/dL (ref 65–99)
GLUCOSE-CAPILLARY: 140 mg/dL — AB (ref 65–99)

## 2015-05-16 MED ORDER — FUROSEMIDE 40 MG PO TABS
60.0000 mg | ORAL_TABLET | Freq: Two times a day (BID) | ORAL | Status: DC
Start: 2015-05-16 — End: 2015-05-16

## 2015-05-16 MED ORDER — INSULIN GLARGINE 100 UNIT/ML ~~LOC~~ SOLN: 20.0000 [IU] | Freq: Two times a day (BID) | SUBCUTANEOUS | Status: AC

## 2015-05-16 MED ORDER — FUROSEMIDE 40 MG PO TABS: 60.0000 mg | ORAL_TABLET | Freq: Two times a day (BID) | ORAL | Status: AC

## 2015-05-16 MED ORDER — INSULIN ASPART 100 UNIT/ML ~~LOC~~ SOLN: 0.0000 [IU] | Freq: Three times a day (TID) | SUBCUTANEOUS | Status: AC

## 2015-05-16 MED ORDER — MORPHINE SULFATE ER 30 MG PO TBCR: 30.0000 mg | EXTENDED_RELEASE_TABLET | Freq: Two times a day (BID) | ORAL | Status: AC

## 2015-05-16 MED ORDER — ALPRAZOLAM 0.5 MG PO TABS: ORAL_TABLET | ORAL | Status: AC

## 2015-05-16 MED ORDER — HYDROCODONE-ACETAMINOPHEN 7.5-325 MG PO TABS: 1.0000 | ORAL_TABLET | Freq: Three times a day (TID) | ORAL | Status: AC | PRN

## 2015-05-16 MED ORDER — ACETAMINOPHEN 325 MG PO TABS: 650.0000 mg | ORAL_TABLET | ORAL | Status: AC | PRN

## 2015-05-16 NOTE — Discharge Instructions (Signed)
Follow with Primary MD MCGOWEN,PHILIP H, MD in 7 days   Get CBC, CMP, 2 view Chest X ray checked  by Primary MD next visit.  - Please weight  yourself daily   Activity: As tolerated with Full fall precautions use walker/cane & assistance as needed   Disposition SNF   Diet: Heart Healthy , low-salt with fluid restriction 1500 mL per day , with feeding assistance and aspiration precautions.  For Heart failure patients - Check your Weight same time everyday, if you gain over 2 pounds, or you develop in leg swelling, experience more shortness of breath or chest pain, call your Primary MD immediately. Follow Cardiac Low Salt Diet and 1.5 lit/day fluid restriction.   On your next visit with your primary care physician please Get Medicines reviewed and adjusted.   Please request your Prim.MD to go over all Hospital Tests and Procedure/Radiological results at the follow up, please get all Hospital records sent to your Prim MD by signing hospital release before you go home.   If you experience worsening of your admission symptoms, develop shortness of breath, life threatening emergency, suicidal or homicidal thoughts you must seek medical attention immediately by calling 911 or calling your MD immediately  if symptoms less severe.  You Must read complete instructions/literature along with all the possible adverse reactions/side effects for all the Medicines you take and that have been prescribed to you. Take any new Medicines after you have completely understood and accpet all the possible adverse reactions/side effects.   Do not drive, operating heavy machinery, perform activities at heights, swimming or participation in water activities or provide baby sitting services if your were admitted for syncope or siezures until you have seen by Primary MD or a Neurologist and advised to do so again.  Do not drive when taking Pain medications.    Do not take more than prescribed Pain, Sleep and  Anxiety Medications  Special Instructions: If you have smoked or chewed Tobacco  in the last 2 yrs please stop smoking, stop any regular Alcohol  and or any Recreational drug use.  Wear Seat belts while driving.   Please note  You were cared for by a hospitalist during your hospital stay. If you have any questions about your discharge medications or the care you received while you were in the hospital after you are discharged, you can call the unit and asked to speak with the hospitalist on call if the hospitalist that took care of you is not available. Once you are discharged, your primary care physician will handle any further medical issues. Please note that NO REFILLS for any discharge medications will be authorized once you are discharged, as it is imperative that you return to your primary care physician (or establish a relationship with a primary care physician if you do not have one) for your aftercare needs so that they can reassess your need for medications and monitor your lab values.

## 2015-05-16 NOTE — Care Management Important Message (Signed)
Important Message  Patient Details  Name: Brooke Mccullough MRN: [id number] Date of Birth: July 30, 1945   Medicare Important Message Given:  Yes    Nathen May 05/16/2015, 4:25 PM

## 2015-05-16 NOTE — Progress Notes (Signed)
Report called to [place].

## 2015-05-16 NOTE — Discharge Summary (Signed)
Brooke Mccullough, is a 70 y.o. female  DOB 06/17/1945  MRN [id number].  Admission date:  05/10/2015  Admitting Physician  Waldemar Dickens, MD  Discharge Date:  05/16/2015   Primary MD  Tammi Sou, MD  Recommendations for primary care physician for things to follow:  - Please check BMP in 3 days, monitor for urine status and adjust diuresis as needed.   Admission Diagnosis  Constipated [K59.00]   Discharge Diagnosis  Constipated [K59.00]    Principal Problem:   Acute on chronic diastolic congestive heart failure, NYHA class 1 (HCC) Active Problems:   Fibromyalgia   Chronic pain syndrome   Multinodular goiter (nontoxic)   Microalbuminuria due to type 2 diabetes mellitus (HCC)   Hypothyroidism   HTN (hypertension), benign   Closed right trimalleolar fracture   COPD GOLD IV criteria but only if use FEV1/VC = 65%    Thrombocytopenia (chronic)   Diabetes mellitus type 2, insulin dependent (HCC)   Morbid obesity (HCC)   Obesity hypoventilation syndrome (HCC)   Acute on chronic respiratory failure with hypoxia (HCC)   Therapeutic opioid induced constipation   Acute diastolic heart failure, NYHA class 1 ([place])      Past Medical History  Diagnosis Date  . COPD (chronic obstructive pulmonary disease) (HCC)     nocturnal oxygen, plus daytime use with activity  . IBS (irritable bowel syndrome)   . Edema     venous insuff + cor pulmonale  . Gallstones     asymptomatic per pt (70 as of 09/30/2013); mild chronic elevation of Alk phos (remainder of hepatic panel wnl).  . Hernia of abdominal wall   . Hypertension   . Hyperlipidemia   . Obesity hypoventilation syndrome ([place])     "3-4 L depending on how I feel; 24/7" (09/29/2014)  . Adrenal benign tumor   . Anemia 2015    Baseline Hb 11, normocytic.  iFOB neg 10/2014.  Marland Kitchen GERD (gastroesophageal reflux disease)   . History of duodenal ulcer   . TIA  (transient ischemic attack) 2012; 2014    left facial and left arm numbness since  . Chronic lower back pain     "L5-S1" (09/29/2014)  . Gout   . Anxiety and depression   . Tremor     "upper extremities; last 6-7 months" (09/28/2013)  . Multinodular goiter summer 2015    FNA 71/6/38 "Follicular lesion of undetermined significance": a follicular lesion/neoplasm can not be ruled out--Dr. Cruzita Lederer recommended repeat bx in 6-12 mo  . Chronic diastolic CHF (congestive heart failure) ([place]) 2015  . History of pneumonia "once"  . Closed right trimalleolar fracture     Surgery/fixation hardware placed.  Marland Kitchen Unspecified hypothyroidism 12/21/2013  . Type II diabetes mellitus (HCC)     hx of good control per pt report + HbA1c <7% June 2015.  Marland Kitchen History of hiatal hernia   . Migraine     "@ least monthly" (09/29/2014)  . Degenerative disc disease     Cervical and lumbar  . Osteoarthritis (  arthritis due to wear and tear of joints)     "back; knees; elbows; left ankle" (09/29/2014)  . Fibromyalgia     Dx'd 1993.  This has impaired her significantly.  . Cancer of right breast ([place]) 2008    Lumpectomy + radiation+aromatase inhibitor (f[facility]ara).  Regular f/u and mammos normal.  . Obesity, Class II, BMI 35-39.9   . Thrombocytopenia (HCC)     Chronic, mild.  ? Letrozole effect ?--consider getting pt f/u with oncology locally to see if letrozole can be adjusted or stopped.    Past Surgical History  Procedure Laterality Date  . Lumbar laminectomy  1991; 1996  . Orif ankle fracture bimalleolar Left 08/2011  . Breast biopsy Right 2008  . Breast lumpectomy Right 2008  . Cataract extraction w/ intraocular lens implant Right 08/2013  . Cardiac catheterization  2000's X 2  . Combined hysterectomy vaginal / oophorectomy / a&p repair / sacrospinous ligament suspension  03/1989  . Orif ankle fracture Right 02/11/2014    Procedure: OPEN REDUCTION INTERNAL FIXATION (ORIF) ANKLE FRACTURE;  Surgeon: Alta Corning, MD;   Location: [facility];  Service: Orthopedics;  Laterality: Right;  . Transthoracic echocardiogram  03/2014    Mild LVH, EF 60-65%, grade I diast dysfxn.  Mild MR.  . Holter monitor  08/2014    48H:  NORMAL  . Back surgery    . Fracture surgery    . Vaginal hysterectomy    . Biopsy thyroid  05/2014    "sample wasn't good; will wait 6 months and repeat biopsy"  . Pfts  01/21/15    Obstructive airway dz with unclear severity and superimposed restrictive dz       History of present illness and  Hospital Course:     Kindly see H&P for history of present illness and admission details, please review complete Labs, Consult reports and Test reports for all details in brief  HPI  from the history and physical done on the day of admission 05/10/2015   This is a 70 year old female patient with multiple medical problems including but not limited to: Chronic respiratory failure in setting of COPD and obesity hypoventilation syndrome, chronic recurrent grade 1 diastolic heart failure, hypertension, microalbuminuria in setting of diabetes with current normal GFR, chronic pain syndrome with fibromyalgia, opioid-induced constipation, diabetes on insulin, hypothyroidism, chronic, thrombocytopenia, chronic closed right trimalleolar fracture with splint and morbid obesity. Patient presented to the ER after becoming dizzy and lightheaded and falling out of her wheelchair and striking her head against the dresser. She has been having issues with progressive dyspnea. She was recently evaluated by her cardiologist on 1/3 at which point her Lasix was increased from 40 to 60 mg by mouth twice a day. During that evaluation patient was found to be hypoxemic with saturation 77% on 3 L pulse oxygen therapy. She was transitioned over to 2 L continuous oxygen and had improved saturations greater than 90 at that time. Patient reports that after increase in Lasix dosage she has not had any significant increase in urinary output or  improvement in her symptoms. She's been having issues with palpitations recently and has an event monitor in place. Patient has noticed now she must sleep sitting up in the chair, increasing edema in her lower extremities, she has not had any constant on 2 additional symptoms such as fever or cough or abdominal pain. She did have some vomiting last night. For several days she has felt chilled. Patient reports she's had difficulty eating  because of shortness of breath.  Hospital Course  70 year old female with hx of COPD and obesity hypoventilation syndrome, recurrent exacerbations of grade 1 diastolic heart failure, hypertension, microalbuminuria in setting of diabetes, chronic pain syndrome with fibromyalgia, opioid-induced constipation, diabetes, hypothyroidism, chronic thrombocytopenia, chronic closed right trimalleolar fracture with splint, and morbid obesity. Patient presented to the ER after becoming dizzy and lightheaded and falling out of her wheelchair and striking her head against a dresser. She had been having issues with progressive dyspnea. She was evaluated by her Cardiologist on 1/3 at which point her Lasix was increased from 40 to 60 mg by mouth twice a day. During that evaluation patient was found to be hypoxemic with saturation 77% on 3 L pulse flow. She was transitioned over to 2 L continuous oxygen and had improved saturations greater than 90 at that time. Patient reports that after increase in Lasix dosage she has not had any significant increase in urinary output or improvement in her symptoms. She'd been having issues with palpitations and had an event monitor placed.   Acute on chronic hypoxic respiratory failure due to Acute on Chronic diastolic congestive heart failure -Much improved with significant volume diuresis - continue to watch Is/Os and daily weights  -net negative ~134.5 L during hospital stay -ACE inhibitor discontinued in June secondary to acute kidney  injury -Initially on IV diuresis, then transitioned to by mouth Lasix 80 mg twice a day with diuresis 1.6 L on that, currently appears to be euvolemic, so will transition back on her home dose 60 mg of Lasix twice a day, instructed to weight herself on a daily basis, if she notices any weight gain or leg swelling to take extra dose of Lasix for that day.  COPD w/ Obesity hypoventilation syndrome -no evidence of bronchospastic exacerbation at this time  Fibromyalgia/Chronic pain syndrome -cont usual home meds - appears to be controlled   DM2 w/ Microalbuminuria  -Not on ACE or ARB secondary to acute kidney injury June 2016 - A1c 6.0 - CBG well controlled  - home Insulin regimen has been changed on discharge, her CBGs very well controlled on Lantus 20 units twice a day , and insulin sliding scale, which we will continue discharge   Opioid induced constipation -Appears well managed at present  Multinodular goiter / Hypothyroidism -Continue Synthroid -TSH December 2016 1.27  Closed right trimalleolar fracture -Continue ankle splint - should not prevent the patient from working with therapy  Chronic Thrombocytopenia -Current platelets near baseline   Morbid obesity - Body mass index is 40.71 kg/(m^2).   Discharge Condition:  stable   Follow UP  Follow-up Information    Schedule an appointment as soon as possible for a visit with Tammi Sou, MD.   Specialty:  Family Medicine   Contact information:   [address] [place] [address] [phone]         Discharge Instructions  and  Discharge Medications     Discharge Instructions    Discharge instructions    Complete by:  As directed   Follow with Primary MD MCGOWEN,PHILIP H, MD in 7 days   Get CBC, CMP, 2 view Chest X ray checked  by Primary MD next visit.  - Please weight  yourself daily   Activity: As tolerated with Full fall precautions use walker/cane & assistance as needed   Disposition  SNF   Diet: Heart Healthy , low-salt with fluid restriction 1500 mL per day , with feeding assistance and  aspiration precautions.  For Heart failure patients - Check your Weight same time everyday, if you gain over 2 pounds, or you develop in leg swelling, experience more shortness of breath or chest pain, call your Primary MD immediately. Follow Cardiac Low Salt Diet and 1.5 lit/day fluid restriction.   On your next visit with your primary care physician please Get Medicines reviewed and adjusted.   Please request your Prim.MD to go over all Hospital Tests and Procedure/Radiological results at the follow up, please get all Hospital records sent to your Prim MD by signing hospital release before you go home.   If you experience worsening of your admission symptoms, develop shortness of breath, life threatening emergency, suicidal or homicidal thoughts you must seek medical attention immediately by calling 911 or calling your MD immediately  if symptoms less severe.  You Must read complete instructions/literature along with all the possible adverse reactions/side effects for all the Medicines you take and that have been prescribed to you. Take any new Medicines after you have completely understood and accpet all the possible adverse reactions/side effects.   Do not drive, operating heavy machinery, perform activities at heights, swimming or participation in water activities or provide baby sitting services if your were admitted for syncope or siezures until you have seen by Primary MD or a Neurologist and advised to do so again.  Do not drive when taking Pain medications.    Do not take more than prescribed Pain, Sleep and Anxiety Medications  Special Instructions: If you have smoked or chewed Tobacco  in the last 2 yrs please stop smoking, stop any regular Alcohol  and or any Recreational drug use.  Wear Seat belts while driving.   Please note  You were cared for by a hospitalist  during your hospital stay. If you have any questions about your discharge medications or the care you received while you were in the hospital after you are discharged, you can call the unit and asked to speak with the hospitalist on call if the hospitalist that took care of you is not available. Once you are discharged, your primary care physician will handle any further medical issues. Please note that NO REFILLS for any discharge medications will be authorized once you are discharged, as it is imperative that you return to your primary care physician (or establish a relationship with a primary care physician if you do not have one) for your aftercare needs so that they can reassess your need for medications and monitor your lab values.     Increase activity slowly    Complete by:  As directed             Medication List    STOP taking these medications        Insulin Glargine 100 UNIT/ML Solostar Pen  Commonly known as:  LANTUS  Replaced by:  insulin glargine 100 UNIT/ML injection      TAKE these medications        acetaminophen 325 MG tablet  Commonly known as:  TYLENOL  Take 2 tablets (650 mg total) by mouth every 4 (four) hours as needed for headache or mild pain.     ALPRAZolam 0.5 MG tablet  Commonly known as:  XANAX  TAKE 1 TABLET BY MOUTH TWICE DAILY AS NEEDED FOR ANXIETY OR SLEEP     atorvastatin 40 MG tablet  Commonly known as:  LIPITOR  TAKE 1 TABLET BY MOUTH DAILY     bisoprolol 5 MG tablet  Commonly known as:  ZEBETA  TAKE 1 TABLET(5 MG) BY MOUTH DAILY     cetirizine 10 MG tablet  Commonly known as:  ZYRTEC  Take 10 mg by mouth daily as needed for allergies.     cholecalciferol 1000 units tablet  Commonly known as:  VITAMIN D  Take 1,000 Units by mouth daily.     citalopram 20 MG tablet  Commonly known as:  CELEXA  TAKE 1 TABLET BY MOUTH EVERY DAY     clopidogrel 75 MG tablet  Commonly known as:  PLAVIX  Take 1 tablet (75 mg total) by mouth daily.      ferrous sulfate 325 (65 FE) MG tablet  Take 325 mg by mouth 2 (two) times daily with a meal.     furosemide 40 MG tablet  Commonly known as:  LASIX  Take 1.5 tablets (60 mg total) by mouth 2 (two) times daily.     HYDROcodone-acetaminophen 7.5-325 MG tablet  Commonly known as:  NORCO  Take 1 tablet by mouth every 8 (eight) hours as needed (pain).     insulin aspart 100 UNIT/ML injection  Commonly known as:  novoLOG  Inject 0-9 Units into the skin 3 (three) times daily with meals.     insulin glargine 100 UNIT/ML injection  Commonly known as:  LANTUS  Inject 0.2 mLs (20 Units total) into the skin 2 (two) times daily.     letrozole 2.5 MG tablet  Commonly known as:  FEMARA  TAKE 1 TABLET BY MOUTH EVERY DAY     levothyroxine 100 MCG tablet  Commonly known as:  SYNTHROID, LEVOTHROID  TAKE 1 TABLET BY MOUTH EVERY DAY BEFORE BREAKFAST     Linaclotide 145 MCG Caps capsule  Commonly known as:  LINZESS  Take 1 capsule (145 mcg total) by mouth daily.     Magnesium Oxide 500 MG (LAX) Tabs  Take 1,000 mg by mouth daily.     meclizine 12.5 MG tablet  Commonly known as:  ANTIVERT  TAKE 1 TO 2 TABLETS BY MOUTH EVERY 6 HOURS AS NEEDED FOR NAUSEA OR DIZZINESS     morphine 30 MG 12 hr tablet  Commonly known as:  MS CONTIN  Take 1 tablet (30 mg total) by mouth every 12 (twelve) hours.     MULTIVITAMIN PO  Take 1 tablet by mouth daily.     NON FORMULARY  Place 3-3.5 L into the nose continuous. Copd/emphesema     omeprazole 40 MG capsule  Commonly known as:  PRILOSEC  TAKE 1 CAPSULE BY MOUTH TWICE DAILY     pregabalin 150 MG capsule  Commonly known as:  LYRICA  1 tab po tid     rOPINIRole 2 MG tablet  Commonly known as:  REQUIP  TAKE 1 TABLET BY MOUTH TWICE DAILY          Diet and Activity recommendation: See Discharge Instructions above   Consults obtained -  none  Major procedures and Radiology Reports - PLEASE review detailed and final reports for all details, in  brief -      Dg Chest 2 View  05/10/2015  CLINICAL DATA:  Dizziness and lightheadedness. EXAM: CHEST  2 VIEW COMPARISON:  10/27/2014 . FINDINGS: Cardiomegaly with pulmonary vascular prominence and bilateral pulmonary infiltrates noted suggesting congestive heart failure with pulmonary edema. Bilateral pneumonia cannot be excluded. Right lower lobe atelectasis. Right pleural effusion cannot be excluded. No pneumothorax. IMPRESSION: 1. Cardiomegaly with diffuse bilateral pulmonary infiltrates suggesting congestive heart  failure pulmonary edema. Bilateral pneumonia cannot be excluded. 2. Dense right lower lobe atelectasis. Right pleural effusion cannot be excluded . Electronically Signed   By: Marcello Moores  Register   On: 05/10/2015 08:37   Dg Pelvis 1-2 Views  05/10/2015  CLINICAL DATA:  Status post fall.  Hurts all over. EXAM: PELVIS - 1-2 VIEW COMPARISON:  None. FINDINGS: There is no evidence of pelvic fracture or diastasis. No pelvic bone lesions are seen. IMPRESSION: No acute osseous injury of the pelvis. Electronically Signed   By: Kathreen Devoid   On: 05/10/2015 08:38   Dg Forearm Right  05/10/2015  CLINICAL DATA:  Status post fall.  Pain all over. EXAM: RIGHT FOREARM - 2 VIEW COMPARISON:  None. FINDINGS: There is no evidence of fracture or other focal bone lesions. Soft tissues are unremarkable. IMPRESSION: No acute osseous injury of the right forearm. Electronically Signed   By: Kathreen Devoid   On: 05/10/2015 08:37   Dg Ankle Complete Right  05/10/2015  CLINICAL DATA:  Fall. EXAM: RIGHT ANKLE - COMPLETE 3+ VIEW COMPARISON:  04/22/2014 . FINDINGS: Diffuse soft tissue swelling. No acute bony abnormality identified. Plate and screw fixation of the malleoli are noted. Hardware intact. IMPRESSION: Diffuse soft tissue swelling. No acute abnormality. Stable changes of bilateral malleolar open reduction internal fixation. Hardware intact. Electronically Signed   By: Marcello Moores  Register   On: 05/10/2015 08:40   Ct  Head Wo Contrast  05/10/2015  CLINICAL DATA:  Fall yesterday at home with headache and posterior neck pain. Initial encounter. EXAM: CT HEAD WITHOUT CONTRAST CT CERVICAL SPINE WITHOUT CONTRAST TECHNIQUE: Multidetector CT imaging of the head and cervical spine was performed following the standard protocol without intravenous contrast. Multiplanar CT image reconstructions of the cervical spine were also generated. COMPARISON:  Head CT 09/29/2014 FINDINGS: CT HEAD FINDINGS Skull and Sinuses:Negative for fracture or destructive process. The visualized mastoids, middle ears, and imaged paranasal sinuses are clear. Visualized orbits: Right cataract resection Brain: No evidence of acute infarction, hemorrhage, hydrocephalus, or mass lesion/mass effect. No extra-axial collection. Cortical atrophy at the vertex with sulcal widening, stable from 2015. Low-density below the left putamen may reflect a dilated perivascular space. There is a remote small vessel infarct in the peripheral left cerebellum. CT CERVICAL SPINE FINDINGS Motion degraded but overall diagnostic. Negative for acute fracture or subluxation. No prevertebral edema. No gross cervical canal hematoma. No significant osseous canal or foraminal stenosis. Multinodular goiter with greater enlargement on the right, appearance stable from 2015. IMPRESSION: No evidence of intracranial or cervical spine injury. Electronically Signed   By: Monte Fantasia M.D.   On: 05/10/2015 08:04   Ct Cervical Spine Wo Contrast  05/10/2015  CLINICAL DATA:  Fall yesterday at home with headache and posterior neck pain. Initial encounter. EXAM: CT HEAD WITHOUT CONTRAST CT CERVICAL SPINE WITHOUT CONTRAST TECHNIQUE: Multidetector CT imaging of the head and cervical spine was performed following the standard protocol without intravenous contrast. Multiplanar CT image reconstructions of the cervical spine were also generated. COMPARISON:  Head CT 09/29/2014 FINDINGS: CT HEAD FINDINGS  Skull and Sinuses:Negative for fracture or destructive process. The visualized mastoids, middle ears, and imaged paranasal sinuses are clear. Visualized orbits: Right cataract resection Brain: No evidence of acute infarction, hemorrhage, hydrocephalus, or mass lesion/mass effect. No extra-axial collection. Cortical atrophy at the vertex with sulcal widening, stable from 2015. Low-density below the left putamen may reflect a dilated perivascular space. There is a remote small vessel infarct in the peripheral left cerebellum.  CT CERVICAL SPINE FINDINGS Motion degraded but overall diagnostic. Negative for acute fracture or subluxation. No prevertebral edema. No gross cervical canal hematoma. No significant osseous canal or foraminal stenosis. Multinodular goiter with greater enlargement on the right, appearance stable from 2015. IMPRESSION: No evidence of intracranial or cervical spine injury. Electronically Signed   By: Monte Fantasia M.D.   On: 05/10/2015 08:04   Dg Chest Port 1 View  05/11/2015  CLINICAL DATA:  Heart failure.  Shortness of breath. EXAM: PORTABLE CHEST 1 VIEW COMPARISON:  05/10/2015 . FINDINGS: Cardiomegaly with mild pulmonary vascular prominence and interstitial prominence with small right pleural effusion. Low lung volumes with basilar atelectasis. IMPRESSION: 1. Congestive heart failure with pulmonary interstitial edema and small right pleural effusion again noted. No significant interim change. 2. Low lung volumes with bibasilar atelectasis, right side greater than left . Electronically Signed   By: Marcello Moores  Register   On: 05/11/2015 08:02   Dg Abd Portable 1v  05/10/2015  CLINICAL DATA:  Multiple falls over the past few days. Dizziness. Initial encounter. EXAM: PORTABLE ABDOMEN - 1 VIEW COMPARISON:  None. FINDINGS: The bowel gas pattern is nonobstructive. No abnormal abdominal calcification or focal bony abnormality is identified. Surgical clips in the pelvis are noted. IMPRESSION: No  acute abnormality. Electronically Signed   By: Inge Rise M.D.   On: 05/10/2015 12:58   Dg Humerus Right  05/10/2015  CLINICAL DATA:  Multiple falls over the last few days. EXAM: RIGHT HUMERUS - 2+ VIEW COMPARISON:  None. FINDINGS: There is no evidence of fracture or other focal bone lesions. There are mild degenerative changes of the acromioclavicular joint. Soft tissues are unremarkable. IMPRESSION: No acute osseous injury of the right humerus. Electronically Signed   By: Kathreen Devoid   On: 05/10/2015 08:36    Micro Results     Recent Results (from the past 240 hour(s))  MRSA PCR Screening     Status: None   Collection Time: 05/11/15  3:02 PM  Result Value Ref Range Status   MRSA by PCR NEGATIVE NEGATIVE Final    Comment:        The GeneXpert MRSA Assay (FDA approved for NASAL specimens only), is one component of a comprehensive MRSA colonization surveillance program. It is not intended to diagnose MRSA infection nor to guide or monitor treatment for MRSA infections.        Today   Subjective:   Brooke Mccullough today has no headache,no chest abdominal pain,no new weakness tingling or numbness, feels much better wants to go home today.   Objective:   Blood pressure 155/41, pulse 57, temperature 97.7 F (36.5 C), temperature source Oral, resp. rate 16, height _0  (1.626 m), weight 107.004 kg (235 lb 14.4 oz), SpO2 97 %.   Intake/Output Summary (Last 24 hours) at 05/16/15 1131 Last data filed at 05/16/15 1106  Gross per 24 hour  Intake   1560 ml  Output   3850 ml  Net  -2290 ml    Exam  General: No acute respiratory distress at rest in bed Lungs: poor air movement th/o all fields / distant breath sounds- no focal crackles or wheeze Cardiovascular: Regular rate and rhythm - no murmur or gallop Abdomen: Nontender, obese, soft, bowel sounds positive, no rebound, no ascites, no appreciable mass Extremities: No significant cyanosis, or clubbing; no edema  bilateral lower extremities , changes of chronic venous stasis    Data Review   CBC w Diff: Lab Results  Component Value  Date   WBC 6.8 05/14/2015   HGB 9.1* 05/14/2015   HCT 29.5* 05/14/2015   PLT 106* 05/14/2015   LYMPHOPCT 8 05/10/2015   MONOPCT 4 05/10/2015   EOSPCT 1 05/10/2015   BASOPCT 0 05/10/2015    CMP: Lab Results  Component Value Date   NA 141 05/15/2015   K 4.0 05/15/2015   CL 91* 05/15/2015   CO2 41* 05/15/2015   BUN 32* 05/15/2015   CREATININE 0.85 05/15/2015   CREATININE 0.82 04/26/2015   PROT 5.9* 05/14/2015   ALBUMIN 2.6* 05/14/2015   BILITOT 0.4 05/14/2015   ALKPHOS 100 05/14/2015   AST 21 05/14/2015   ALT 20 05/14/2015  .   Total Time in preparing paper work, data evaluation and todays exam - 35 minutes  Laycee Fitzsimmons M.D on 05/16/2015 at 11:31 AM  [facility]   Office  [phone]

## 2015-05-16 NOTE — Progress Notes (Signed)
Pt to be d/c today to [facility].   Pt and family agreeable. Confirmed plans with facility.  Plan transfer via EMS.    [facility]  [phone]

## 2015-05-16 NOTE — Progress Notes (Signed)
OT Cancellation Note  Patient Details Name: Brooke Mccullough MRN: [id number] DOB: 01-13-1946   Cancelled Treatment:    Reason Eval/Treat Not Completed: Other (comment) Pt is Medicare and current D/C plan is SNF. No apparent immediate acute care OT needs, therefore will defer OT to SNF. If OT eval is needed please call Acute Rehab Dept. at [phone] or text page OT at [phone].    Almon Register [id number] 05/16/2015, 7:07 AM

## 2015-05-18 IMAGING — CR DG KNEE COMPLETE 4+V*R*
3 series · 3 of 3 positions shown · non-contrast
Comparison: None.

CLINICAL DATA: Fell, pain with soft tissue swelling

EXAM:
RIGHT KNEE - COMPLETE 4+ VIEW

[t knee oblique right (1 of 2)]
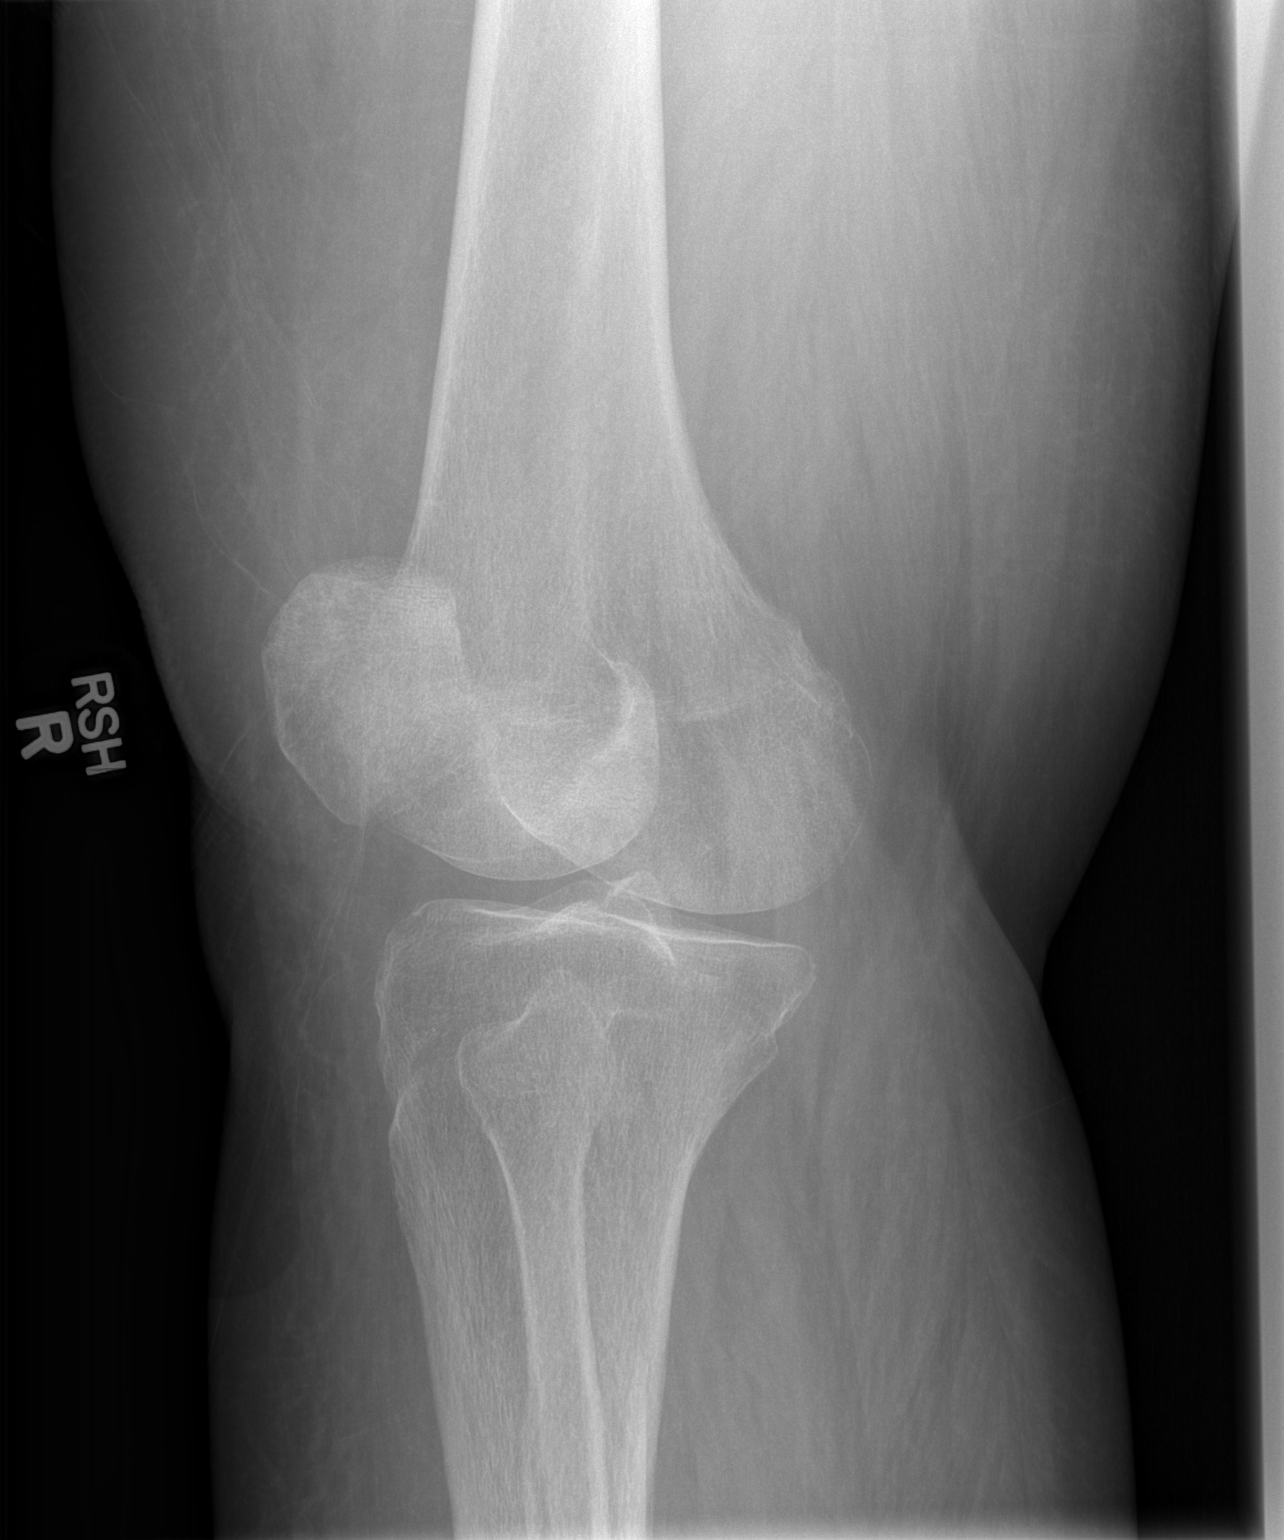

[t knee ap right]
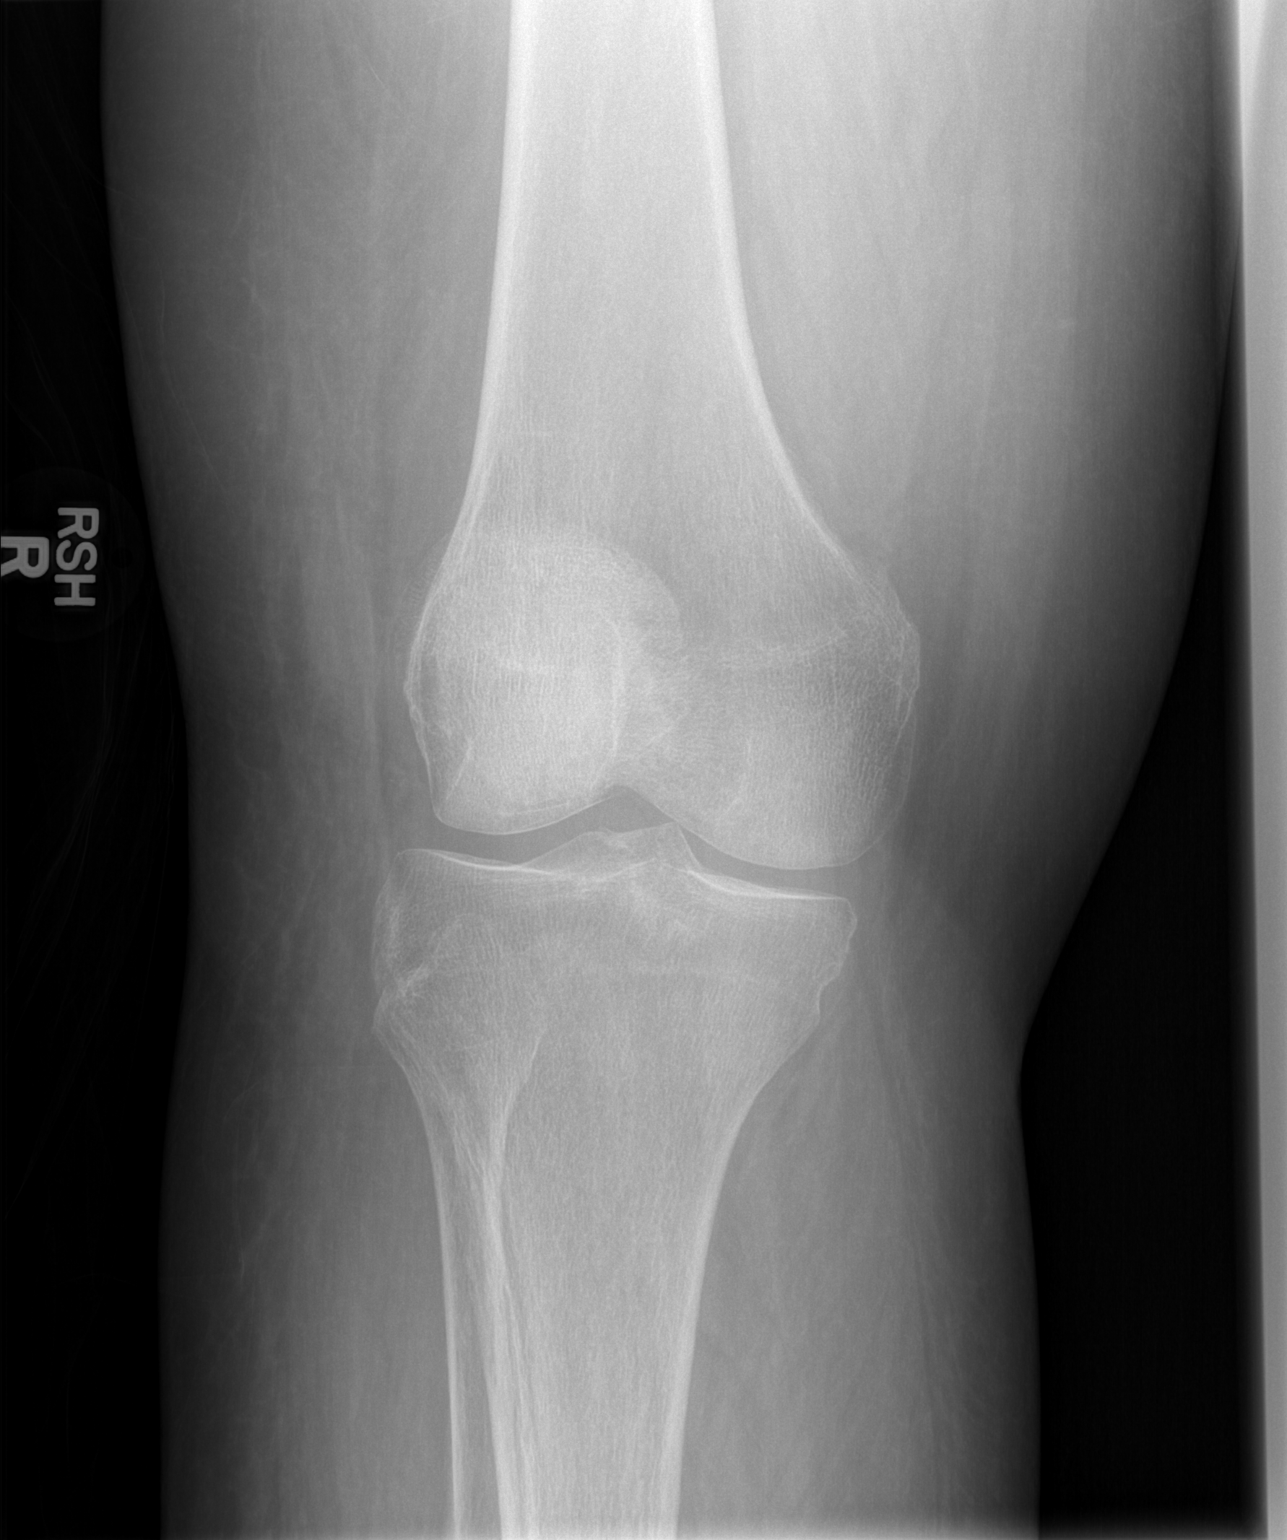

[t knee oblique right (2 of 2)]
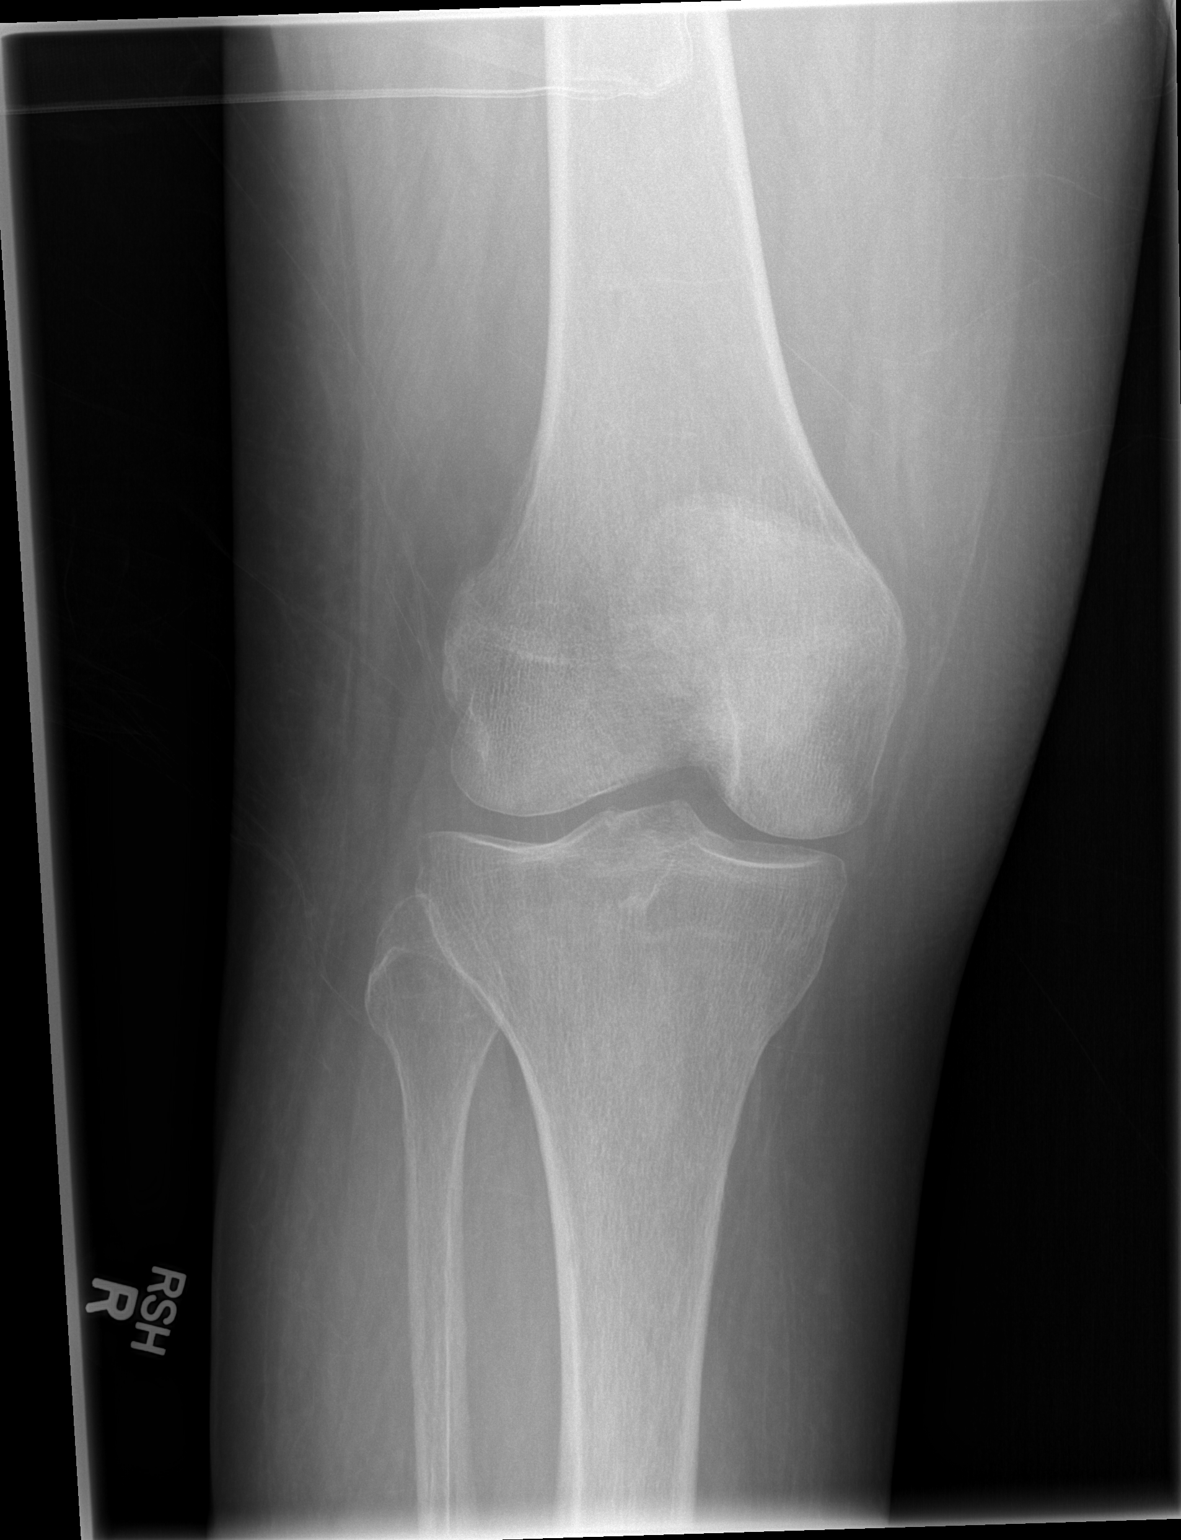

[3 of 3 positions shown; findings below may reference images not displayed]

FINDINGS: There is no fracture or effusion. No dislocation is evident. There
is mild soft tissue swelling. Osteopenia is noted.
IMPRESSION: Negative for fracture or effusion.

## 2015-05-18 IMAGING — RF DG ANKLE COMPLETE 3+V*R*
1 series · 3 of 3 positions shown · non-contrast
Comparison: Earlier today.

CLINICAL DATA: Operative fixation of a right ankle trimalleolar
fracture.

EXAM:
DG C-ARM 61-120 MIN; RIGHT ANKLE - COMPLETE 3+ VIEW

[Series 1: run · 3 of 3 slices shown]
[im 1/3]
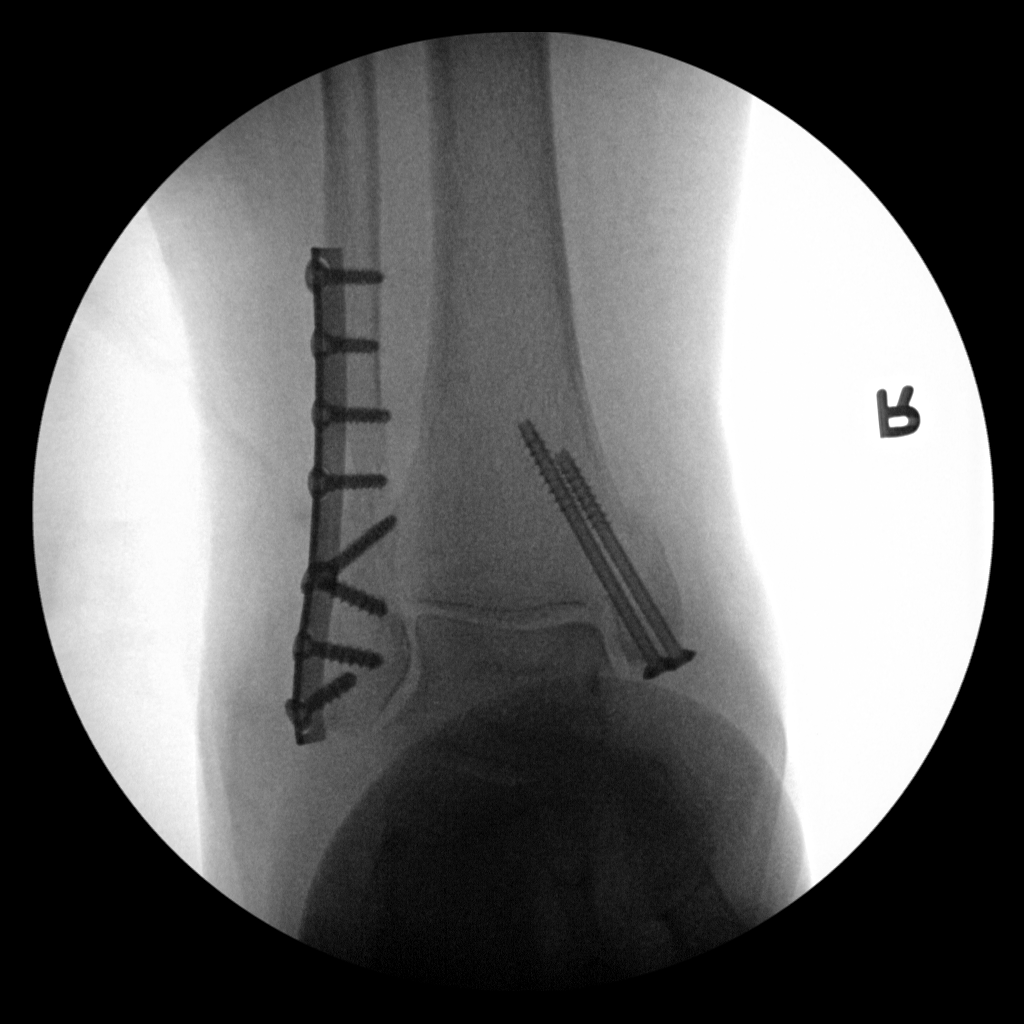
[im 2/3]
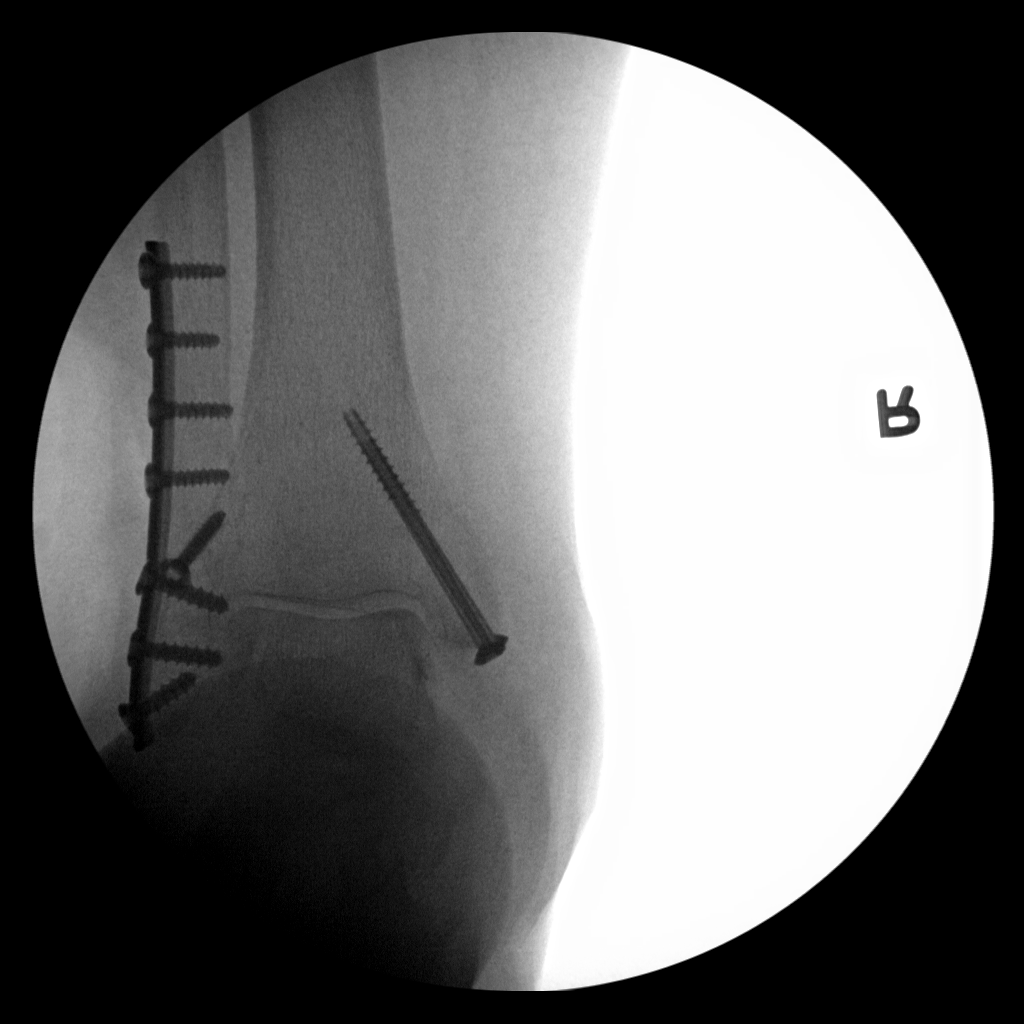
[im 3/3]
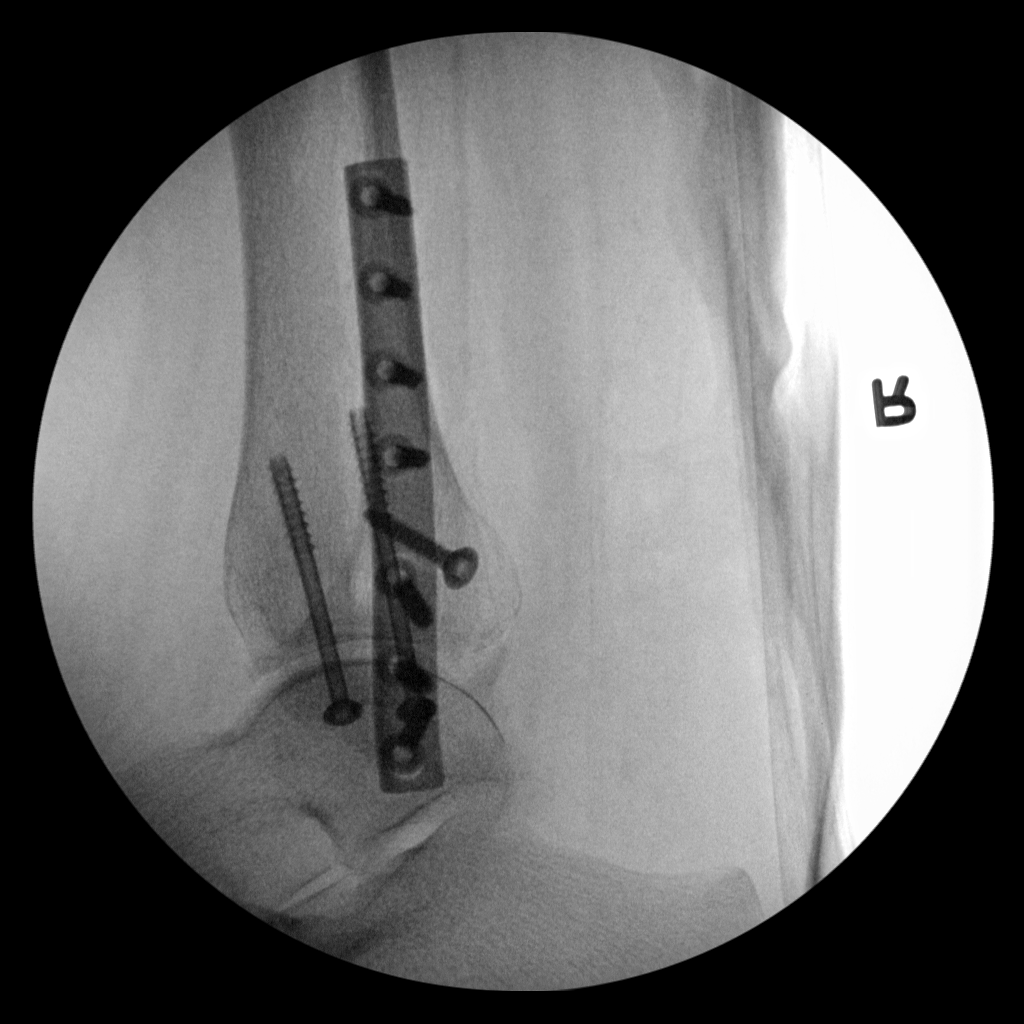

[3 of 3 positions shown; findings below may reference images not displayed]

FINDINGS: Three C arm views of the right ankle demonstrate interval screw and
plate fixation of the previously seen lateral malleolus fracture and
screw fixation of the previously seen medial malleolus fracture.
Anatomic position and alignment of the lateral malleolus fracture
fragments and mild medial displacement of the medial fragment of the
medial malleolus fracture. Previously noted posterior malleolus
fracture with mild proximal displacement of the posterior fragment.
IMPRESSION: Trimalleolar fracture with hardware fixation of the medial and
lateral malleoli, as described above.

## 2015-05-23 ENCOUNTER — Ambulatory Visit: Payer: Medicare Other | Admitting: Internal Medicine

## 2015-06-17 ENCOUNTER — Telehealth: Payer: Self-pay | Admitting: Family Medicine

## 2015-06-17 NOTE — Telephone Encounter (Signed)
FYI

## 2015-06-17 NOTE — Progress Notes (Signed)
HPI: FU diastolic CHF; Holter 5/15 showed sinus. Lower ext dopplers 6/16 showed no DVT. CTA 6/16 showed no pulmonary embolus; thyroid nodule and ultrasound recommmended. Echocardiogram January 2017 showed normal LV function and moderate left atrial enlargement. Monitor January 2017 showed sinus bradycardia. Patient admitted in January 2017 with congestive heart failure. She was diuresed with improvement. Since last seen,   Current Outpatient Prescriptions  Medication Sig Dispense Refill  . acetaminophen (TYLENOL) 325 MG tablet Take 2 tablets (650 mg total) by mouth every 4 (four) hours as needed for headache or mild pain.    Marland Kitchen ALPRAZolam (XANAX) 0.5 MG tablet TAKE 1 TABLET BY MOUTH TWICE DAILY AS NEEDED FOR ANXIETY OR SLEEP 30 tablet 0  . atorvastatin (LIPITOR) 40 MG tablet TAKE 1 TABLET BY MOUTH DAILY 30 tablet 11  . bisoprolol (ZEBETA) 5 MG tablet TAKE 1 TABLET(5 MG) BY MOUTH DAILY 30 tablet 11  . cetirizine (ZYRTEC) 10 MG tablet Take 10 mg by mouth daily as needed for allergies.    . cholecalciferol (VITAMIN D) 1000 UNITS tablet Take 1,000 Units by mouth daily.    . citalopram (CELEXA) 20 MG tablet TAKE 1 TABLET BY MOUTH EVERY DAY 30 tablet 11  . clopidogrel (PLAVIX) 75 MG tablet Take 1 tablet (75 mg total) by mouth daily. 90 tablet 3  . ferrous sulfate 325 (65 FE) MG tablet Take 325 mg by mouth 2 (two) times daily with a meal.    . furosemide (LASIX) 40 MG tablet Take 1.5 tablets (60 mg total) by mouth 2 (two) times daily. 90 tablet 6  . HYDROcodone-acetaminophen (NORCO) 7.5-325 MG tablet Take 1 tablet by mouth every 8 (eight) hours as needed (pain). 20 tablet 0  . insulin aspart (NOVOLOG) 100 UNIT/ML injection Inject 0-9 Units into the skin 3 (three) times daily with meals. 10 mL 11  . insulin glargine (LANTUS) 100 UNIT/ML injection Inject 0.2 mLs (20 Units total) into the skin 2 (two) times daily. 10 mL 11  . letrozole (FEMARA) 2.5 MG tablet TAKE 1 TABLET BY MOUTH EVERY DAY 90 tablet  3  . levothyroxine (SYNTHROID, LEVOTHROID) 100 MCG tablet TAKE 1 TABLET BY MOUTH EVERY DAY BEFORE BREAKFAST 90 tablet 3  . Linaclotide (LINZESS) 145 MCG CAPS capsule Take 1 capsule (145 mcg total) by mouth daily. 30 capsule 6  . Magnesium Oxide 500 MG (LAX) TABS Take 1,000 mg by mouth daily.     . meclizine (ANTIVERT) 12.5 MG tablet TAKE 1 TO 2 TABLETS BY MOUTH EVERY 6 HOURS AS NEEDED FOR NAUSEA OR DIZZINESS 60 tablet 6  . morphine (MS CONTIN) 30 MG 12 hr tablet Take 1 tablet (30 mg total) by mouth every 12 (twelve) hours. 20 tablet 0  . Multiple Vitamins-Minerals (MULTIVITAMIN PO) Take 1 tablet by mouth daily.    . NON FORMULARY Place 3-3.5 L into the nose continuous. Copd/emphesema    . omeprazole (PRILOSEC) 40 MG capsule TAKE 1 CAPSULE BY MOUTH TWICE DAILY 60 capsule 11  . pregabalin (LYRICA) 150 MG capsule 1 tab po tid 90 capsule 5  . rOPINIRole (REQUIP) 2 MG tablet TAKE 1 TABLET BY MOUTH TWICE DAILY 60 tablet 11   No current facility-administered medications for this visit.     Past Medical History  Diagnosis Date  . COPD (chronic obstructive pulmonary disease) (HCC)     nocturnal oxygen, plus daytime use with activity  . IBS (irritable bowel syndrome)   . Edema     venous insuff +  cor pulmonale  . Gallstones     asymptomatic per pt (as of 09/30/2013); mild chronic elevation of Alk phos (remainder of hepatic panel wnl).  . Hernia of abdominal wall   . Hypertension   . Hyperlipidemia   . Obesity hypoventilation syndrome ([place])     "3-4 L depending on how I feel; 24/7" (09/29/2014)  . Adrenal benign tumor   . Anemia 2015    Baseline Hb 11, normocytic.  iFOB neg 10/2014.  Marland Kitchen GERD (gastroesophageal reflux disease)   . History of duodenal ulcer   . TIA (transient ischemic attack) 2012; 2014    left facial and left arm numbness since  . Chronic lower back pain     "L5-S1" (09/29/2014)  . Gout   . Anxiety and depression   . Tremor     "upper extremities; last 6-7 months" (09/28/2013)    . Multinodular goiter summer 2015    FNA 57/8/46 "Follicular lesion of undetermined significance": a follicular lesion/neoplasm can not be ruled out--Dr. Cruzita Lederer recommended repeat bx in 6-12 mo  . Chronic diastolic CHF (congestive heart failure) ([place]) 2015  . History of pneumonia "once"  . Closed right trimalleolar fracture     Surgery/fixation hardware placed.  Marland Kitchen Unspecified hypothyroidism 12/21/2013  . Type II diabetes mellitus (HCC)     hx of good control per pt report + HbA1c <7% June 2015.  Marland Kitchen History of hiatal hernia   . Migraine     "@ least monthly" (09/29/2014)  . Degenerative disc disease     Cervical and lumbar  . Osteoarthritis (arthritis due to wear and tear of joints)     "back; knees; elbows; left ankle" (09/29/2014)  . Fibromyalgia     Dx'd 1993.  This has impaired her significantly.  . Cancer of right breast ([place]) 2008    Lumpectomy + radiation+aromatase inhibitor (femara).  Regular f/u and mammos normal.  . Obesity, Class II, BMI 35-39.9   . Thrombocytopenia (HCC)     Chronic, mild.  ? Letrozole effect ?--consider getting pt f/u with oncology locally to see if letrozole can be adjusted or stopped.    Past Surgical History  Procedure Laterality Date  . Lumbar laminectomy  1991; 1996  . Orif ankle fracture bimalleolar Left 08/2011  . Breast biopsy Right 2008  . Breast lumpectomy Right 2008  . Cataract extraction w/ intraocular lens implant Right 08/2013  . Cardiac catheterization  2000's X 2  . Combined hysterectomy vaginal / oophorectomy / a&p repair / sacrospinous ligament suspension  03/1989  . Orif ankle fracture Right 02/11/2014    Procedure: OPEN REDUCTION INTERNAL FIXATION (ORIF) ANKLE FRACTURE;  Surgeon: Alta Corning, MD;  Location: [facility];  Service: Orthopedics;  Laterality: Right;  . Transthoracic echocardiogram  03/2014    Mild LVH, EF 60-65%, grade I diast dysfxn.  Mild MR.  . Holter monitor  08/2014    48H:  NORMAL  . Back surgery    . Fracture surgery     . Vaginal hysterectomy    . Biopsy thyroid  05/2014    "sample wasn't good; will wait 6 months and repeat biopsy"  . Pfts  01/21/15    Obstructive airway dz with unclear severity and superimposed restrictive dz    Social History   Social History  . Marital Status: Married    Spouse Name: N/A  . Number of Children: N/A  . Years of Education: N/A   Occupational History  . Retired    Science writer  History Main Topics  . Smoking status: Former Smoker -- 1.00 packs/day for 36 years    Types: Cigarettes    Quit date: 09/09/2013  . Smokeless tobacco: Never Used  . Alcohol Use: No  . Drug Use: No  . Sexual Activity: Not Currently   Other Topics Concern  . Not on file   Social History Narrative   Married, 2 children (one son and one daughter).   Relocated from [place] 08/2013 to be closer to children.   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   Educ: HS and 2 yrs business college.   Tobacco: 36 pack-yr hx, quit 2015.   Alcohol: none.  No drugs.   Ambulated with walker prior to right ankle trimalleolar fracture and subsequent surgery 01/2014.          Family History  Problem Relation Age of Onset  . Arthritis Mother   . Breast cancer Mother   . Hyperlipidemia Mother   . Stroke Mother   . Hypertension Mother   . Mental illness Mother   . Arthritis Father   . Cancer Father   . Hyperlipidemia Father   . Heart disease Father   . Heart disease Maternal Grandmother   . Hyperlipidemia Maternal Grandmother   . Heart disease Paternal Grandmother   . Hypertension Paternal Grandmother   . Diabetes Paternal Grandmother   . Arthritis Paternal Grandfather     ROS: no fevers or chills, productive cough, hemoptysis, dysphasia, odynophagia, melena, hematochezia, dysuria, hematuria, rash, seizure activity, orthopnea, PND, pedal edema, claudication. Remaining systems are negative.  Physical Exam: Well-developed well-nourished in no acute distress.  Skin is warm  and dry.  HEENT is normal.  Neck is supple.  Chest is clear to auscultation with normal expansion.  Cardiovascular exam is regular rate and rhythm.  Abdominal exam nontender or distended. No masses palpated. Extremities show no edema. neuro grossly intact  ECG     This encounter was created in error - please disregard.

## 2015-06-17 NOTE — Telephone Encounter (Signed)
Ivin Booty calling to advise Dr. Anitra Lauth that his pt is now in the care of [facility].

## 2015-06-17 NOTE — Telephone Encounter (Signed)
Noted  

## 2015-06-20 ENCOUNTER — Emergency Department (HOSPITAL_COMMUNITY): Payer: Medicare Other

## 2015-06-20 ENCOUNTER — Inpatient Hospital Stay (HOSPITAL_COMMUNITY)
Admission: EM | Admit: 2015-06-20 | Discharge: 2015-07-23 | DRG: 208 | Disposition: E | Payer: Medicare Other | Attending: Internal Medicine | Admitting: Internal Medicine

## 2015-06-20 ENCOUNTER — Encounter (HOSPITAL_COMMUNITY): Payer: Self-pay

## 2015-06-20 ENCOUNTER — Encounter: Payer: Medicare Other | Admitting: Cardiology

## 2015-06-20 DIAGNOSIS — Z87891 Personal history of nicotine dependence: Secondary | ICD-10-CM | POA: Diagnosis not present

## 2015-06-20 DIAGNOSIS — Z794 Long term (current) use of insulin: Secondary | ICD-10-CM | POA: Diagnosis not present

## 2015-06-20 DIAGNOSIS — Z853 Personal history of malignant neoplasm of breast: Secondary | ICD-10-CM | POA: Diagnosis not present

## 2015-06-20 DIAGNOSIS — K589 Irritable bowel syndrome without diarrhea: Secondary | ICD-10-CM | POA: Diagnosis present

## 2015-06-20 DIAGNOSIS — I5033 Acute on chronic diastolic (congestive) heart failure: Secondary | ICD-10-CM | POA: Diagnosis present

## 2015-06-20 DIAGNOSIS — J90 Pleural effusion, not elsewhere classified: Secondary | ICD-10-CM | POA: Insufficient documentation

## 2015-06-20 DIAGNOSIS — K219 Gastro-esophageal reflux disease without esophagitis: Secondary | ICD-10-CM | POA: Diagnosis present

## 2015-06-20 DIAGNOSIS — Z6841 Body Mass Index (BMI) 40.0 and over, adult: Secondary | ICD-10-CM | POA: Diagnosis not present

## 2015-06-20 DIAGNOSIS — I5030 Unspecified diastolic (congestive) heart failure: Secondary | ICD-10-CM | POA: Diagnosis not present

## 2015-06-20 DIAGNOSIS — J810 Acute pulmonary edema: Secondary | ICD-10-CM | POA: Diagnosis not present

## 2015-06-20 DIAGNOSIS — Z515 Encounter for palliative care: Secondary | ICD-10-CM | POA: Diagnosis present

## 2015-06-20 DIAGNOSIS — E871 Hypo-osmolality and hyponatremia: Secondary | ICD-10-CM | POA: Diagnosis present

## 2015-06-20 DIAGNOSIS — Z66 Do not resuscitate: Secondary | ICD-10-CM

## 2015-06-20 DIAGNOSIS — Z7902 Long term (current) use of antithrombotics/antiplatelets: Secondary | ICD-10-CM | POA: Diagnosis not present

## 2015-06-20 DIAGNOSIS — J9622 Acute and chronic respiratory failure with hypercapnia: Secondary | ICD-10-CM | POA: Diagnosis present

## 2015-06-20 DIAGNOSIS — D649 Anemia, unspecified: Secondary | ICD-10-CM | POA: Diagnosis present

## 2015-06-20 DIAGNOSIS — J9690 Respiratory failure, unspecified, unspecified whether with hypoxia or hypercapnia: Secondary | ICD-10-CM | POA: Diagnosis present

## 2015-06-20 DIAGNOSIS — N179 Acute kidney failure, unspecified: Secondary | ICD-10-CM | POA: Diagnosis present

## 2015-06-20 DIAGNOSIS — E872 Acidosis: Secondary | ICD-10-CM | POA: Diagnosis present

## 2015-06-20 DIAGNOSIS — L89302 Pressure ulcer of unspecified buttock, stage 2: Secondary | ICD-10-CM | POA: Diagnosis present

## 2015-06-20 DIAGNOSIS — J189 Pneumonia, unspecified organism: Secondary | ICD-10-CM | POA: Diagnosis present

## 2015-06-20 DIAGNOSIS — J9621 Acute and chronic respiratory failure with hypoxia: Principal | ICD-10-CM | POA: Diagnosis present

## 2015-06-20 DIAGNOSIS — M797 Fibromyalgia: Secondary | ICD-10-CM | POA: Diagnosis present

## 2015-06-20 DIAGNOSIS — Z79899 Other long term (current) drug therapy: Secondary | ICD-10-CM

## 2015-06-20 DIAGNOSIS — F419 Anxiety disorder, unspecified: Secondary | ICD-10-CM | POA: Diagnosis present

## 2015-06-20 DIAGNOSIS — I5031 Acute diastolic (congestive) heart failure: Secondary | ICD-10-CM | POA: Diagnosis not present

## 2015-06-20 DIAGNOSIS — M109 Gout, unspecified: Secondary | ICD-10-CM | POA: Diagnosis present

## 2015-06-20 DIAGNOSIS — Z8249 Family history of ischemic heart disease and other diseases of the circulatory system: Secondary | ICD-10-CM | POA: Diagnosis not present

## 2015-06-20 DIAGNOSIS — Z961 Presence of intraocular lens: Secondary | ICD-10-CM | POA: Diagnosis present

## 2015-06-20 DIAGNOSIS — Z8673 Personal history of transient ischemic attack (TIA), and cerebral infarction without residual deficits: Secondary | ICD-10-CM

## 2015-06-20 DIAGNOSIS — Z885 Allergy status to narcotic agent status: Secondary | ICD-10-CM | POA: Diagnosis not present

## 2015-06-20 DIAGNOSIS — J449 Chronic obstructive pulmonary disease, unspecified: Secondary | ICD-10-CM | POA: Diagnosis present

## 2015-06-20 DIAGNOSIS — Z8711 Personal history of peptic ulcer disease: Secondary | ICD-10-CM

## 2015-06-20 DIAGNOSIS — D696 Thrombocytopenia, unspecified: Secondary | ICD-10-CM | POA: Diagnosis present

## 2015-06-20 DIAGNOSIS — G43909 Migraine, unspecified, not intractable, without status migrainosus: Secondary | ICD-10-CM | POA: Diagnosis present

## 2015-06-20 DIAGNOSIS — I161 Hypertensive emergency: Secondary | ICD-10-CM | POA: Diagnosis present

## 2015-06-20 DIAGNOSIS — J9600 Acute respiratory failure, unspecified whether with hypoxia or hypercapnia: Secondary | ICD-10-CM | POA: Diagnosis not present

## 2015-06-20 DIAGNOSIS — F329 Major depressive disorder, single episode, unspecified: Secondary | ICD-10-CM | POA: Diagnosis present

## 2015-06-20 DIAGNOSIS — Z91040 Latex allergy status: Secondary | ICD-10-CM | POA: Diagnosis not present

## 2015-06-20 DIAGNOSIS — G9340 Encephalopathy, unspecified: Secondary | ICD-10-CM | POA: Diagnosis present

## 2015-06-20 DIAGNOSIS — Z9841 Cataract extraction status, right eye: Secondary | ICD-10-CM | POA: Diagnosis not present

## 2015-06-20 DIAGNOSIS — Z888 Allergy status to other drugs, medicaments and biological substances status: Secondary | ICD-10-CM

## 2015-06-20 DIAGNOSIS — E785 Hyperlipidemia, unspecified: Secondary | ICD-10-CM | POA: Diagnosis present

## 2015-06-20 DIAGNOSIS — M545 Low back pain: Secondary | ICD-10-CM | POA: Diagnosis present

## 2015-06-20 DIAGNOSIS — K5900 Constipation, unspecified: Secondary | ICD-10-CM | POA: Diagnosis present

## 2015-06-20 DIAGNOSIS — Z79891 Long term (current) use of opiate analgesic: Secondary | ICD-10-CM

## 2015-06-20 DIAGNOSIS — Z8701 Personal history of pneumonia (recurrent): Secondary | ICD-10-CM

## 2015-06-20 DIAGNOSIS — Z881 Allergy status to other antibiotic agents status: Secondary | ICD-10-CM | POA: Diagnosis not present

## 2015-06-20 DIAGNOSIS — Z880 Allergy status to penicillin: Secondary | ICD-10-CM

## 2015-06-20 DIAGNOSIS — R451 Restlessness and agitation: Secondary | ICD-10-CM | POA: Diagnosis not present

## 2015-06-20 DIAGNOSIS — Z90710 Acquired absence of both cervix and uterus: Secondary | ICD-10-CM | POA: Diagnosis not present

## 2015-06-20 DIAGNOSIS — G8929 Other chronic pain: Secondary | ICD-10-CM | POA: Diagnosis present

## 2015-06-20 DIAGNOSIS — E039 Hypothyroidism, unspecified: Secondary | ICD-10-CM | POA: Diagnosis present

## 2015-06-20 DIAGNOSIS — J948 Other specified pleural conditions: Secondary | ICD-10-CM | POA: Diagnosis not present

## 2015-06-20 DIAGNOSIS — L8990 Pressure ulcer of unspecified site, unspecified stage: Secondary | ICD-10-CM | POA: Diagnosis not present

## 2015-06-20 DIAGNOSIS — M1990 Unspecified osteoarthritis, unspecified site: Secondary | ICD-10-CM | POA: Diagnosis present

## 2015-06-20 DIAGNOSIS — J811 Chronic pulmonary edema: Secondary | ICD-10-CM

## 2015-06-20 DIAGNOSIS — Z9981 Dependence on supplemental oxygen: Secondary | ICD-10-CM | POA: Diagnosis not present

## 2015-06-20 DIAGNOSIS — E662 Morbid (severe) obesity with alveolar hypoventilation: Secondary | ICD-10-CM | POA: Diagnosis present

## 2015-06-20 DIAGNOSIS — E119 Type 2 diabetes mellitus without complications: Secondary | ICD-10-CM | POA: Diagnosis present

## 2015-06-20 DIAGNOSIS — R0602 Shortness of breath: Secondary | ICD-10-CM | POA: Diagnosis present

## 2015-06-20 LAB — I-STAT ARTERIAL BLOOD GAS, ED
ACID-BASE EXCESS: 3 mmol/L — AB (ref 0.0–2.0)
Acid-Base Excess: 2 mmol/L (ref 0.0–2.0)
Bicarbonate: 33.5 mEq/L — ABNORMAL HIGH (ref 20.0–24.0)
Bicarbonate: 31.6 mEq/L — ABNORMAL HIGH (ref 20.0–24.0)
O2 SAT: 68 %
O2 Saturation: 98 %
PCO2 ART: 96.7 mmHg — AB (ref 35.0–45.0)
PH ART: 7.15 — AB (ref 7.350–7.450)
PH ART: 7.278 — AB (ref 7.350–7.450)
TCO2: 36 mmol/L (ref 0–100)
TCO2: 34 mmol/L (ref 0–100)
pCO2 arterial: 67.5 mmHg (ref 35.0–45.0)
pO2, Arterial: 49 mmHg — ABNORMAL LOW (ref 80.0–100.0)
pO2, Arterial: 128 mmHg — ABNORMAL HIGH (ref 80.0–100.0)

## 2015-06-20 LAB — URINALYSIS, ROUTINE W REFLEX MICROSCOPIC
BILIRUBIN URINE: NEGATIVE
Glucose, UA: NEGATIVE mg/dL
Ketones, ur: NEGATIVE mg/dL
LEUKOCYTES UA: NEGATIVE
Nitrite: NEGATIVE
PH: 5 (ref 5.0–8.0)
Protein, ur: 100 mg/dL — AB
Specific Gravity, Urine: 1.011 (ref 1.005–1.030)

## 2015-06-20 LAB — MRSA PCR SCREENING: MRSA BY PCR: NEGATIVE

## 2015-06-20 LAB — BRAIN NATRIURETIC PEPTIDE: B NATRIURETIC PEPTIDE 5: 170.2 pg/mL — AB (ref 0.0–100.0)

## 2015-06-20 LAB — BLOOD GAS, ARTERIAL
Acid-Base Excess: 6.1 mmol/L — ABNORMAL HIGH (ref 0.0–2.0)
BICARBONATE: 30.6 meq/L — AB (ref 20.0–24.0)
Drawn by: 281201
FIO2: 0.6
LHR: 30 {breaths}/min
O2 Saturation: 96.9 %
PEEP: 5 cmH2O
PO2 ART: 94.6 mmHg (ref 80.0–100.0)
Patient temperature: 102.2
TCO2: 32.1 mmol/L (ref 0–100)
VT: 420 mL
pCO2 arterial: 53.6 mmHg — ABNORMAL HIGH (ref 35.0–45.0)
pH, Arterial: 7.386 (ref 7.350–7.450)

## 2015-06-20 LAB — URINE MICROSCOPIC-ADD ON

## 2015-06-20 LAB — COMPREHENSIVE METABOLIC PANEL
ALBUMIN: 3 g/dL — AB (ref 3.5–5.0)
ALT: 14 U/L (ref 14–54)
ANION GAP: 10 (ref 5–15)
AST: 24 U/L (ref 15–41)
Alkaline Phosphatase: 110 U/L (ref 38–126)
BUN: 42 mg/dL — ABNORMAL HIGH (ref 6–20)
CALCIUM: 8.8 mg/dL — AB (ref 8.9–10.3)
CO2: 32 mmol/L (ref 22–32)
Chloride: 88 mmol/L — ABNORMAL LOW (ref 101–111)
Creatinine, Ser: 1.12 mg/dL — ABNORMAL HIGH (ref 0.44–1.00)
GFR calc non Af Amer: 49 mL/min — ABNORMAL LOW (ref >60)
GFR, EST AFRICAN AMERICAN: 56 mL/min — AB (ref >60)
GLUCOSE: 167 mg/dL — AB (ref 65–99)
POTASSIUM: 3.8 mmol/L (ref 3.5–5.1)
SODIUM: 130 mmol/L — AB (ref 135–145)
Total Bilirubin: 0.5 mg/dL (ref 0.3–1.2)
Total Protein: 6.8 g/dL (ref 6.5–8.1)

## 2015-06-20 LAB — CBC WITH DIFFERENTIAL/PLATELET
BASOS ABS: 0 10*3/uL (ref 0.0–0.1)
Basophils Relative: 0 %
EOS ABS: 0 10*3/uL (ref 0.0–0.7)
Eosinophils Relative: 0 %
HEMATOCRIT: 36.5 % (ref 36.0–46.0)
Hemoglobin: 11.6 g/dL — ABNORMAL LOW (ref 12.0–15.0)
Lymphocytes Relative: 9 %
Lymphs Abs: 1.3 10*3/uL (ref 0.7–4.0)
MCH: 25.3 pg — ABNORMAL LOW (ref 26.0–34.0)
MCHC: 31.8 g/dL (ref 30.0–36.0)
MCV: 79.7 fL (ref 78.0–100.0)
MONO ABS: 0.6 10*3/uL (ref 0.1–1.0)
Monocytes Relative: 4 %
Neutro Abs: 11.9 10*3/uL — ABNORMAL HIGH (ref 1.7–7.7)
Neutrophils Relative %: 87 %
PLATELETS: 148 10*3/uL — AB (ref 150–400)
RBC: 4.58 MIL/uL (ref 3.87–5.11)
RDW: 13.7 % (ref 11.5–15.5)
WBC: 13.8 10*3/uL — ABNORMAL HIGH (ref 4.0–10.5)

## 2015-06-20 LAB — GLUCOSE, CAPILLARY
GLUCOSE-CAPILLARY: 104 mg/dL — AB (ref 65–99)
GLUCOSE-CAPILLARY: 101 mg/dL — AB (ref 65–99)
GLUCOSE-CAPILLARY: 119 mg/dL — AB (ref 65–99)

## 2015-06-20 LAB — BASIC METABOLIC PANEL
ANION GAP: 13 (ref 5–15)
BUN: 47 mg/dL — ABNORMAL HIGH (ref 6–20)
CO2: 29 mmol/L (ref 22–32)
Calcium: 8.7 mg/dL — ABNORMAL LOW (ref 8.9–10.3)
Chloride: 90 mmol/L — ABNORMAL LOW (ref 101–111)
Creatinine, Ser: 1.38 mg/dL — ABNORMAL HIGH (ref 0.44–1.00)
GFR, EST AFRICAN AMERICAN: 44 mL/min — AB (ref >60)
GFR, EST NON AFRICAN AMERICAN: 38 mL/min — AB (ref >60)
GLUCOSE: 106 mg/dL — AB (ref 65–99)
POTASSIUM: 4.5 mmol/L (ref 3.5–5.1)
SODIUM: 132 mmol/L — AB (ref 135–145)

## 2015-06-20 LAB — I-STAT CG4 LACTIC ACID, ED: LACTIC ACID, VENOUS: 1.96 mmol/L (ref 0.5–2.0)

## 2015-06-20 LAB — CBG MONITORING, ED: Glucose-Capillary: 124 mg/dL — ABNORMAL HIGH (ref 65–99)

## 2015-06-20 LAB — TROPONIN I: Troponin I: 0.06 ng/mL — ABNORMAL HIGH (ref <0.031)

## 2015-06-20 MED ORDER — MIDAZOLAM HCL 2 MG/2ML IJ SOLN
INTRAMUSCULAR | Status: AC
  Administered 2015-06-20: 1 mg via INTRAVENOUS
  Filled 2015-06-20: qty 2

## 2015-06-20 MED ORDER — ONDANSETRON HCL 4 MG/2ML IJ SOLN
4.0000 mg | Freq: Four times a day (QID) | INTRAMUSCULAR | Status: DC | PRN
  Administered 2015-06-22 (×2): 4 mg via INTRAVENOUS
  Filled 2015-06-20 (×2): qty 2

## 2015-06-20 MED ORDER — MIDAZOLAM HCL 2 MG/2ML IJ SOLN
INTRAMUSCULAR | Status: AC
  Filled 2015-06-20: qty 2

## 2015-06-20 MED ORDER — FUROSEMIDE 10 MG/ML IJ SOLN
40.0000 mg | Freq: Two times a day (BID) | INTRAMUSCULAR | Status: AC
  Administered 2015-06-20 – 2015-06-22 (×4): 40 mg via INTRAVENOUS
  Filled 2015-06-20 (×4): qty 4

## 2015-06-20 MED ORDER — MIDAZOLAM HCL 2 MG/2ML IJ SOLN
1.0000 mg | INTRAMUSCULAR | Status: DC | PRN
  Administered 2015-06-20 – 2015-06-22 (×9): 1 mg via INTRAVENOUS
  Filled 2015-06-20 (×6): qty 2

## 2015-06-20 MED ORDER — ANTISEPTIC ORAL RINSE SOLUTION (CORINZ): 7.0000 mL | Freq: Four times a day (QID) | OROMUCOSAL | Status: DC

## 2015-06-20 MED ORDER — ALBUTEROL SULFATE (2.5 MG/3ML) 0.083% IN NEBU: 2.5000 mg | INHALATION_SOLUTION | RESPIRATORY_TRACT | Status: DC | PRN

## 2015-06-20 MED ORDER — MORPHINE SULFATE (PF) 2 MG/ML IV SOLN
1.0000 mg | INTRAVENOUS | Status: DC | PRN
  Administered 2015-06-20 (×2): 1 mg via INTRAVENOUS
  Administered 2015-06-20 – 2015-06-25 (×22): 2 mg via INTRAVENOUS
  Filled 2015-06-20 (×23): qty 1

## 2015-06-20 MED ORDER — POTASSIUM CHLORIDE 20 MEQ/15ML (10%) PO SOLN
40.0000 meq | Freq: Once | ORAL | Status: AC
  Administered 2015-06-20: 40 meq
  Filled 2015-06-20: qty 30

## 2015-06-20 MED ORDER — CHLORHEXIDINE GLUCONATE 0.12% ORAL RINSE (MEDLINE KIT)
15.0000 mL | Freq: Two times a day (BID) | OROMUCOSAL | Status: DC
  Administered 2015-06-20 – 2015-06-22 (×5): 15 mL via OROMUCOSAL

## 2015-06-20 MED ORDER — SODIUM CHLORIDE 0.9 % IV SOLN
1.0000 g | Freq: Two times a day (BID) | INTRAVENOUS | Status: DC
  Administered 2015-06-20 – 2015-06-22 (×4): 1 g via INTRAVENOUS
  Filled 2015-06-20 (×5): qty 1

## 2015-06-20 MED ORDER — INSULIN ASPART 100 UNIT/ML ~~LOC~~ SOLN
0.0000 [IU] | SUBCUTANEOUS | Status: DC
  Administered 2015-06-21: 3 [IU] via SUBCUTANEOUS
  Administered 2015-06-21: 2 [IU] via SUBCUTANEOUS
  Administered 2015-06-21: 3 [IU] via SUBCUTANEOUS
  Administered 2015-06-22: 5 [IU] via SUBCUTANEOUS
  Administered 2015-06-22: 3 [IU] via SUBCUTANEOUS
  Administered 2015-06-22: 2 [IU] via SUBCUTANEOUS
  Administered 2015-06-22 (×4): 3 [IU] via SUBCUTANEOUS
  Administered 2015-06-23: 5 [IU] via SUBCUTANEOUS
  Administered 2015-06-23: 2 [IU] via SUBCUTANEOUS
  Administered 2015-06-23: 3 [IU] via SUBCUTANEOUS
  Administered 2015-06-23: 2 [IU] via SUBCUTANEOUS
  Administered 2015-06-23: 5 [IU] via SUBCUTANEOUS
  Administered 2015-06-23: 2 [IU] via SUBCUTANEOUS
  Administered 2015-06-24: 3 [IU] via SUBCUTANEOUS
  Administered 2015-06-24 – 2015-06-25 (×7): 2 [IU] via SUBCUTANEOUS
  Administered 2015-06-25 – 2015-06-26 (×2): 3 [IU] via SUBCUTANEOUS
  Administered 2015-06-26: 2 [IU] via SUBCUTANEOUS
  Administered 2015-06-26: 3 [IU] via SUBCUTANEOUS

## 2015-06-20 MED ORDER — LEVOTHYROXINE SODIUM 100 MCG PO TABS
100.0000 ug | ORAL_TABLET | Freq: Every day | ORAL | Status: DC
  Administered 2015-06-21 – 2015-06-25 (×4): 100 ug
  Filled 2015-06-20 (×4): qty 1

## 2015-06-20 MED ORDER — MORPHINE SULFATE (PF) 2 MG/ML IV SOLN
INTRAVENOUS | Status: AC
  Filled 2015-06-20: qty 1

## 2015-06-20 MED ORDER — ANTISEPTIC ORAL RINSE SOLUTION (CORINZ)
7.0000 mL | Freq: Four times a day (QID) | OROMUCOSAL | Status: DC
  Administered 2015-06-20 – 2015-06-22 (×10): 7 mL via OROMUCOSAL

## 2015-06-20 MED ORDER — FUROSEMIDE 10 MG/ML IJ SOLN
80.0000 mg | Freq: Once | INTRAMUSCULAR | Status: AC
  Administered 2015-06-20: 80 mg via INTRAVENOUS
  Filled 2015-06-20: qty 8

## 2015-06-20 MED ORDER — NITROGLYCERIN IN D5W 200-5 MCG/ML-% IV SOLN
5.0000 ug/min | Freq: Once | INTRAVENOUS | Status: AC
  Administered 2015-06-20: 5 ug/min via INTRAVENOUS
  Filled 2015-06-20: qty 250

## 2015-06-20 MED ORDER — SODIUM CHLORIDE 0.9 % IV SOLN
INTRAVENOUS | Status: DC
  Administered 2015-06-20 – 2015-06-23 (×2): via INTRAVENOUS

## 2015-06-20 MED ORDER — PROPOFOL 1000 MG/100ML IV EMUL
5.0000 ug/kg/min | Freq: Once | INTRAVENOUS | Status: AC
  Administered 2015-06-20: 15 ug/kg/min via INTRAVENOUS
  Filled 2015-06-20: qty 100

## 2015-06-20 MED ORDER — ACETAMINOPHEN 160 MG/5ML PO SOLN
650.0000 mg | Freq: Four times a day (QID) | ORAL | Status: DC | PRN
  Administered 2015-06-20: 650 mg
  Filled 2015-06-20: qty 20.3

## 2015-06-20 MED ORDER — SUCCINYLCHOLINE CHLORIDE 20 MG/ML IJ SOLN
100.0000 mg | Freq: Once | INTRAMUSCULAR | Status: AC
  Administered 2015-06-20: 100 mg via INTRAVENOUS
  Filled 2015-06-20: qty 5

## 2015-06-20 MED ORDER — MIDAZOLAM HCL 2 MG/2ML IJ SOLN
1.0000 mg | INTRAMUSCULAR | Status: AC | PRN
Start: 2015-06-20 — End: 2015-06-21
  Administered 2015-06-20 – 2015-06-21 (×2): 1 mg via INTRAVENOUS
  Filled 2015-06-20 (×3): qty 2

## 2015-06-20 MED ORDER — HEPARIN SODIUM (PORCINE) 5000 UNIT/ML IJ SOLN
5000.0000 [IU] | Freq: Three times a day (TID) | INTRAMUSCULAR | Status: DC
  Administered 2015-06-20 – 2015-06-26 (×19): 5000 [IU] via SUBCUTANEOUS
  Filled 2015-06-20 (×18): qty 1

## 2015-06-20 MED ORDER — MORPHINE SULFATE (PF) 2 MG/ML IV SOLN
INTRAVENOUS | Status: AC
  Administered 2015-06-20: 1 mg via INTRAVENOUS
  Filled 2015-06-20: qty 1

## 2015-06-20 MED ORDER — PANTOPRAZOLE SODIUM 40 MG IV SOLR
40.0000 mg | Freq: Every day | INTRAVENOUS | Status: DC
  Administered 2015-06-20 – 2015-06-24 (×5): 40 mg via INTRAVENOUS
  Filled 2015-06-20 (×5): qty 40

## 2015-06-20 MED ORDER — LINEZOLID 600 MG/300ML IV SOLN
600.0000 mg | Freq: Two times a day (BID) | INTRAVENOUS | Status: DC
  Administered 2015-06-20 – 2015-06-22 (×4): 600 mg via INTRAVENOUS
  Filled 2015-06-20 (×5): qty 300

## 2015-06-20 MED ORDER — CLOPIDOGREL BISULFATE 75 MG PO TABS
75.0000 mg | ORAL_TABLET | Freq: Every day | ORAL | Status: DC
  Administered 2015-06-20 – 2015-06-26 (×6): 75 mg via ORAL
  Filled 2015-06-20 (×6): qty 1

## 2015-06-20 MED ORDER — ETOMIDATE 2 MG/ML IV SOLN
20.0000 mg | Freq: Once | INTRAVENOUS | Status: AC
  Administered 2015-06-20: 20 mg via INTRAVENOUS

## 2015-06-20 MED ORDER — FERROUS SULFATE 325 (65 FE) MG PO TABS
325.0000 mg | ORAL_TABLET | Freq: Two times a day (BID) | ORAL | Status: DC
  Administered 2015-06-20 – 2015-06-26 (×8): 325 mg via ORAL
  Filled 2015-06-20 (×8): qty 1

## 2015-06-20 NOTE — Progress Notes (Signed)
eLink Physician-Brief Progress Note Patient Name: Brooke Mccullough DOB: 1946-04-01 MRN: [id number]   Date of Service  05/31/2015  HPI/Events of Note  Patient febrile to 102.2 F. Blood and urine Cultures are pending. Patient with multiple antibiotic drug allergies.  eICU Interventions  Will order: 1. Zyvox 600 mg IV now and Q 24 hours.  2. Meropenem IV per pharmacy consult.  3. Tracheal Aspirate Culture now.      Intervention Category Major Interventions: Infection - evaluation and management  Sommer,Steven Eugene 06/17/2015, 5:19 PM

## 2015-06-20 NOTE — Progress Notes (Signed)
ANTIBIOTIC CONSULT NOTE - INITIAL  Pharmacy Consult for meropenem Indication: r/o sepsis  Allergies  Allergen Reactions  . Ciprofloxacin Itching and Other (See Comments)    Unspecified--noted in old PCP's records.   . Clindamycin/Lincomycin Rash  . Fentanyl Rash    Duragesic Patch   . Indocin [Indomethacin] Rash  . Keflex [Cephalexin] Rash  . Latex Itching and Other (See Comments)    Unspecified: noted in old PCP's records  . Lincomycin Hcl Rash  . Penicillins Rash    No other information available at this time  . Vancomycin Rash    Patient Measurements: Height: _0  (165.1 cm) Weight: 234 lb 2.1 oz (106.2 kg) IBW/kg (Calculated) : 57  Vital Signs: Temp: 102.2 F (39 C) (02/27 1700) Temp Source: Core (Comment) (02/27 1145) BP: 160/52 mmHg (02/27 1700) Pulse Rate: 77 (02/27 1700) Intake/Output from previous day:   Intake/Output from this shift: Total I/O In: 139.7 [I.V.:79.7; NG/GT:60] Out: 875 [Urine:875]  Labs:  Recent Labs  06/13/2015 0550 05/31/2015 1617  WBC 13.8*  --   HGB 11.6*  --   PLT 148*  --   CREATININE 1.12* 1.38*   Estimated Creatinine Clearance: 45.9 mL/min (by C-G formula based on Cr of 1.38). No results for input(s): VANCOTROUGH, VANCOPEAK, VANCORANDOM, GENTTROUGH, GENTPEAK, GENTRANDOM, TOBRATROUGH, TOBRAPEAK, TOBRARND, AMIKACINPEAK, AMIKACINTROU, AMIKACIN in the last 72 hours.   Microbiology: Recent Results (from the past 720 hour(s))  MRSA PCR Screening     Status: None   Collection Time: 06/09/2015 11:56 AM  Result Value Ref Range Status   MRSA by PCR NEGATIVE NEGATIVE Final    Comment:        The GeneXpert MRSA Assay (FDA approved for NASAL specimens only), is one component of a comprehensive MRSA colonization surveillance program. It is not intended to diagnose MRSA infection nor to guide or monitor treatment for MRSA infections.     Medical History: Past Medical History  Diagnosis Date  . COPD (chronic obstructive  pulmonary disease) (HCC)     nocturnal oxygen, plus daytime use with activity  . IBS (irritable bowel syndrome)   . Edema     venous insuff + cor pulmonale  . Gallstones     asymptomatic per pt (as of 09/30/2013); mild chronic elevation of Alk phos (remainder of hepatic panel wnl).  . Hernia of abdominal wall   . Hypertension   . Hyperlipidemia   . Obesity hypoventilation syndrome ([place])     "3-4 L depending on how I feel; 24/7" (09/29/2014)  . Adrenal benign tumor   . Anemia 2015    Baseline Hb 11, normocytic.  iFOB neg 10/2014.  Marland Kitchen GERD (gastroesophageal reflux disease)   . History of duodenal ulcer   . TIA (transient ischemic attack) 2012; 2014    left facial and left arm numbness since  . Chronic lower back pain     "L5-S1" (09/29/2014)  . Gout   . Anxiety and depression   . Tremor     "upper extremities; last 6-7 months" (09/28/2013)  . Multinodular goiter summer 2015    FNA 47/8/29 "Follicular lesion of undetermined significance": a follicular lesion/neoplasm can not be ruled out--Dr. Cruzita Lederer recommended repeat bx in 6-12 mo  . Chronic diastolic CHF (congestive heart failure) ([place]) 2015  . History of pneumonia "once"  . Closed right trimalleolar fracture     Surgery/fixation hardware placed.  Marland Kitchen Unspecified hypothyroidism 12/21/2013  . Type II diabetes mellitus (HCC)     hx of good control per  pt report + HbA1c <7% June 2015.  Marland Kitchen History of hiatal hernia   . Migraine     "@ least monthly" (09/29/2014)  . Degenerative disc disease     Cervical and lumbar  . Osteoarthritis (arthritis due to wear and tear of joints)     "back; knees; elbows; left ankle" (09/29/2014)  . Fibromyalgia     Dx'd 1993.  This has impaired her significantly.  . Cancer of right breast ([place]) 2008    Lumpectomy + radiation+aromatase inhibitor (femara).  Regular f/u and mammos normal.  . Obesity, Class II, BMI 35-39.9   . Thrombocytopenia (HCC)     Chronic, mild.  ? Letrozole effect ?--consider getting pt f/u  with oncology locally to see if letrozole can be adjusted or stopped.    Assessment: 70 YO female with multiple co-morbid illness admitted with Respiratory distress. Now spiking fever and leukocytosis. Pt is allergic to multiple antibiotics. She is started on Zyvox, pharmacy is consulted to start meropenem. Scr 1.38, est. crcl ~ 45 ml/min.   2/27 Urine 2/27 Blood x 2  Plan:  - Meropenem 1g IV Q 12 hrs - Monitor renal function and f/u cultures   Brooke Mccullough, PharmD, BCPS  Clinical Pharmacist  Pager: [phone]   05/30/2015,5:50 PM

## 2015-06-20 NOTE — H&P (Signed)
PULMONARY / CRITICAL CARE MEDICINE   Name: Brooke Mccullough MRN: [id number] DOB: 24-Oct-1945    ADMISSION DATE:  05/26/2015 CONSULTATION DATE: 06/17/2015  REFERRING MD:  Titus Mould  CHIEF COMPLAINT:  Respiratory distress  HISTORY OF PRESENT ILLNESS:  Brooke Mccullough is a 70 yo white female with multiple co-morbid illness such as COPD, HTN, IBS, hyperlipidemia, obesity , bening adrenal tumor, TIA in 2012 and 14, chronic lower back pain , anxiety , depression ,CHF, goiter,holter monitoring. She lives in the rehab type assisted living facility with her husband. Patient uses home oxygen.   On 2/27her O2 sats  Were down to 60's- 70's . EMS reports that she was tripoding and in severe respiratory distress. She was placed on CPAP and her O2 sats improved to 90%.  She was transferred to [facility] ED on 05/25/2015. She vomitted upon arrival to ED, CPAP was removed and was placed on the NRB. She recived 4mg  of Zofran and albuterol in the ED.  She was in acute respiratory distress, was somnolent, but answers all questions appropriately.  She was however confused at times and there fore was intubated on 05/31/2015  On arrival to ED Her Na-130, K3.8, bun-42, Cr- 1.12, WBC -13.8, Platelets- 148. Troponin-0.06    PAST MEDICAL HISTORY :  She  has a past medical history of COPD (chronic obstructive pulmonary disease) ([place]); IBS (irritable bowel syndrome); Edema; Gallstones; Hernia of abdominal wall; Hypertension; Hyperlipidemia; Obesity hypoventilation syndrome ([place]); Adrenal benign tumor; Anemia (2015); GERD (gastroesophageal reflux disease); History of duodenal ulcer; TIA (transient ischemic attack) (2012; 2014); Chronic lower back pain; Gout; Anxiety and depression; Tremor; Multinodular goiter (summer 2015); Chronic diastolic CHF (congestive heart failure) ([place]) (2015); History of pneumonia ("once"); Closed right trimalleolar fracture; Unspecified hypothyroidism (12/21/2013); Type II diabetes mellitus ([place]); History of  hiatal hernia; Migraine; Degenerative disc disease; Osteoarthritis (arthritis due to wear and tear of joints); Fibromyalgia; Cancer of right breast ([place]) (2008); Obesity, Class II, BMI 35-39.9; and Thrombocytopenia ([place]).  PAST SURGICAL HISTORY: She  has past surgical history that includes Lumbar laminectomy (1991; 1996); ORIF ankle fracture bimalleolar (Left, 08/2011); Breast biopsy (Right, 2008); Breast lumpectomy (Right, 2008); Cataract extraction w/ intraocular lens implant (Right, 08/2013); Cardiac catheterization (2000's X 2); Combined hysterectomy vaginal / oophorectomy / A&P repair / sacrospinous ligament suspension (03/1989); ORIF ankle fracture (Right, 02/11/2014); transthoracic echocardiogram (03/2014); HOLTER MONITOR (08/2014); Back surgery; Fracture surgery; Vaginal hysterectomy; Biopsy thyroid (05/2014); and PFTs (01/21/15).  Allergies  Allergen Reactions  . Ciprofloxacin Other (See Comments) and Itching    Unspecified--noted in old PCP's records. Unspecified--noted in old PCP's records.  . Clindamycin/Lincomycin Rash  . Fentanyl Rash    Duragesic Patch Duragesic Patch  . Indocin [Indomethacin] Rash  . Keflex [Cephalexin] Rash  . Latex Other (See Comments) and Itching    Unspecified: noted in old PCP's records. Unspecified: noted in old PCP's records.  . Lincomycin Hcl Rash  . Penicillins Rash  . Vancomycin Rash    No current facility-administered medications on file prior to encounter.   Current Outpatient Prescriptions on File Prior to Encounter  Medication Sig  . acetaminophen (TYLENOL) 325 MG tablet Take 2 tablets (650 mg total) by mouth every 4 (four) hours as needed for headache or mild pain.  Marland Kitchen ALPRAZolam (XANAX) 0.5 MG tablet TAKE 1 TABLET BY MOUTH TWICE DAILY AS NEEDED FOR ANXIETY OR SLEEP  . atorvastatin (LIPITOR) 40 MG tablet TAKE 1 TABLET BY MOUTH DAILY  . bisoprolol (ZEBETA) 5 MG tablet TAKE 1 TABLET(5 MG) BY MOUTH  DAILY  . cetirizine (ZYRTEC) 10 MG tablet Take  10 mg by mouth daily as needed for allergies.  . cholecalciferol (VITAMIN D) 1000 UNITS tablet Take 1,000 Units by mouth daily.  . citalopram (CELEXA) 20 MG tablet TAKE 1 TABLET BY MOUTH EVERY DAY  . clopidogrel (PLAVIX) 75 MG tablet Take 1 tablet (75 mg total) by mouth daily.  . ferrous sulfate 325 (65 FE) MG tablet Take 325 mg by mouth 2 (two) times daily with a meal.  . furosemide (LASIX) 40 MG tablet Take 1.5 tablets (60 mg total) by mouth 2 (two) times daily.  Marland Kitchen HYDROcodone-acetaminophen (NORCO) 7.5-325 MG tablet Take 1 tablet by mouth every 8 (eight) hours as needed (pain).  . insulin aspart (NOVOLOG) 100 UNIT/ML injection Inject 0-9 Units into the skin 3 (three) times daily with meals.  . insulin glargine (LANTUS) 100 UNIT/ML injection Inject 0.2 mLs (20 Units total) into the skin 2 (two) times daily.  Marland Kitchen letrozole (FEMARA) 2.5 MG tablet TAKE 1 TABLET BY MOUTH EVERY DAY  . levothyroxine (SYNTHROID, LEVOTHROID) 100 MCG tablet TAKE 1 TABLET BY MOUTH EVERY DAY BEFORE BREAKFAST  . Linaclotide (LINZESS) 145 MCG CAPS capsule Take 1 capsule (145 mcg total) by mouth daily.  . Magnesium Oxide 500 MG (LAX) TABS Take 1,000 mg by mouth daily.   . meclizine (ANTIVERT) 12.5 MG tablet TAKE 1 TO 2 TABLETS BY MOUTH EVERY 6 HOURS AS NEEDED FOR NAUSEA OR DIZZINESS  . morphine (MS CONTIN) 30 MG 12 hr tablet Take 1 tablet (30 mg total) by mouth every 12 (twelve) hours.  . Multiple Vitamins-Minerals (MULTIVITAMIN PO) Take 1 tablet by mouth daily.  . NON FORMULARY Place 3-3.5 L into the nose continuous. Copd/emphesema  . omeprazole (PRILOSEC) 40 MG capsule TAKE 1 CAPSULE BY MOUTH TWICE DAILY  . pregabalin (LYRICA) 150 MG capsule 1 tab po tid  . rOPINIRole (REQUIP) 2 MG tablet TAKE 1 TABLET BY MOUTH TWICE DAILY    FAMILY HISTORY:  Her indicated that her mother is deceased. She indicated that her father is deceased. She indicated that her brother is alive. She indicated that her maternal grandmother is deceased.  She indicated that her maternal grandfather is deceased. She indicated that her paternal grandmother is deceased. She indicated that her paternal grandfather is deceased. She indicated that her daughter is alive. She indicated that her son is alive.   SOCIAL HISTORY: She  reports that she quit smoking about 21 months ago. Her smoking use included Cigarettes. She has a 36 pack-year smoking history. She has never used smokeless tobacco. She reports that she does not drink alcohol or use illicit drugs.  REVIEW OF SYSTEMS:   Unable to obtain  SUBJECTIVE:   patient was found to be intubated and is on vent support with family at the bedside.  VITAL SIGNS: BP 111/50 mmHg  Pulse 71  Temp(Src) 99.5 F (37.5 C) (Rectal)  Resp 21  Ht 5\' 3"  (1.6 m)  Wt 235 lb 14.3 oz (107 kg)  BMI 41.80 kg/m2  SpO2 97%  HEMODYNAMICS:    VENTILATOR SETTINGS: Vent Mode:  [-] PRVC FiO2 (%):  [100 %] 100 % Set Rate:  [20 bmp-24 bmp] 24 bmp Vt Set:  [420 mL] 420 mL PEEP:  [5 cmH20] 5 cmH20 Plateau Pressure:  [24 cmH20-30 cmH20] 24 cmH20  INTAKE / OUTPUT:    PHYSICAL EXAMINATION: General:  Morbidly obese , white female Neuro:  Atraumatic, normocephalic, white sclera HEENT: PERRLA, NO discharge, mid septum Cardiovascular:  ,  S1S2, Regular, no MRG noted Lungs: Bibasilar crackles, diminished on left lower, noo use of accessory muscle. Abdomen:  Obese, proturbent. nontender  Skin: Intact, no rash, ecchymosis noted  LABS:  BMET  Recent Labs Lab 06/19/2015 0550  NA 130*  K 3.8  CL 88*  CO2 32  BUN 42*  CREATININE 1.12*  GLUCOSE 167*    Electrolytes  Recent Labs Lab 06/15/2015 0550  CALCIUM 8.8*    CBC  Recent Labs Lab 05/28/2015 0550  WBC 13.8*  HGB 11.6*  HCT 36.5  PLT 148*    Coag's No results for input(s): APTT, INR in the last 168 hours.  Sepsis Markers  Recent Labs Lab 06/06/2015 0608  LATICACIDVEN 1.96    ABG  Recent Labs Lab 05/26/2015 0620  PHART 7.150*  PCO2ART  96.7*  PO2ART 49.0*    Liver Enzymes  Recent Labs Lab 06/10/2015 0550  AST 24  ALT 14  ALKPHOS 110  BILITOT 0.5  ALBUMIN 3.0*    Cardiac Enzymes  Recent Labs Lab 06/17/2015 0550  TROPONINI 0.06*    Glucose No results for input(s): GLUCAP in the last 168 hours.  Imaging Dg Chest Portable 1 View  06/05/2015  CLINICAL DATA:  ETT placement EXAM: PORTABLE CHEST 1 VIEW COMPARISON:  06/05/2015 FINDINGS: NG tube in stomach. The lung volumes and bibasilar atelectasis. There is perihilar airspace disease greater on the RIGHT which is not changed prior. No pneumothorax. IMPRESSION: 1. Endotracheal tube 1.5 cm from carina.  NG tube in stomach. 2. No Change in asymmetric airspace disease greater on the RIGHT. 3. Bilateral effusions, greater on the RIGHT Electronically Signed   By: Suzy Bouchard M.D.   On: 06/07/2015 07:12   Dg Chest Port 1 View  05/27/2015  CLINICAL DATA:  Acute onset of shortness of breath. Initial encounter. EXAM: PORTABLE CHEST 1 VIEW COMPARISON:  Chest radiograph performed 05/11/2015 FINDINGS: A moderate right-sided pleural effusion is noted. Bilateral airspace opacification, worse on the right, may reflect asymmetric pulmonary edema or pneumonia. No pneumothorax is seen. The cardiomediastinal silhouette is mildly enlarged. No acute osseous abnormalities are identified. IMPRESSION: Moderate right-sided pleural effusion noted. Bilateral airspace opacification, worse on the right, may reflect asymmetric pulmonary edema or pneumonia. Mild cardiomegaly. Electronically Signed   By: Garald Balding M.D.   On: 06/16/2015 06:21     STUDIES:  None  CULTURES: UA> 2/27 negatiVE  ANTIBIOTICS: none  SIGNIFICANT EVENTS: ED admit>2/27 Intubated>2/27  LINES/TUBES: none  DISCUSSION: 9 year white female with multiple co-morbid illness was brought to the [facility] on 06/07/2015  With  Respiratory distress due to  Exacerbation of CHF and Hypertension and pulmonary edema. ASSESSMENT /  PLAN:  PULMONARY A: Acute on chronic respiratory failure. COPD  P:   Intubated on Vent. DNR status discussed with family. Routine ABG Routine CXR Wean as tolerated continue Morphine Held profol due to hypotension   CARDIOVASCULAR A:  CHF Hypertension Hyperlipidemia Obesity Hypoventilation syndrome P:  Diurese as tolerated will monitor Normotensive now but will  reqire her home meds home meds  Continue plavix Trend troponin Strict I/O  RENAL A:    Acute Kidney Injury Hyponatremia Mild thrombocytopenia P:   Trend chemistry Replace potassium, receiving lasix  GASTROINTESTINAL A:   Hx IBS GERD P:  NPO now  Hold home med  protonix  Start tube feedings tomorrow     HEMATOLOGIC A:    Mild Anaemia P:   trend cbc  INFECTIOUS A:   Leukocytosis P:   Stress respose,  trend wbc  Heparin s/q for dvt prophylaxis  T  ENDOCRINE A:   Hypothyroidism DM P:   Continue synthroid. SSI coverage Hold Lantus now   NEUROLOGIC A:   Anxiety Depression Acute encephalopathy? Migraine P:   Hold Alprazolam RASS goal: 0- -1 Wean sedation as tolerated with plans to extubate Takes MS contin-30 mg twice at home. Will increase Morphine as needed.   FAMILY  - Updates:  Family was updated by Dr. Titus Mould  - Inter-disciplinary family meet or Palliative Care meeting due by: 06/24/15   Bincy Varughese,AG-ACNP Pulmonary & Critical Care Pulmonary and [place] Pager: [phone]  05/25/2015, 9:05 AM   STAFF NOTE: Linwood Dibbles, MD FACP have personally reviewed patient's available data, including medical history, events of note, physical examination and test results as part of my evaluation. I have discussed with resident/NP and other care providers such as pharmacist, RN and RRT. In addition, I personally evaluated patient and elicited key findings of:  Crackles on examination, acute resp failure, from terminal diastlic  dysfxn, likely exacerbated by HTN, now on vent, acidosis, increase rate, repeat abg, adjust ET out 1 cm, lasix to neg balance, extensive d/w husband, DNR , no escalation, vent x 48 hrs max, dc NTg as sys low, dc prop may be need, WUA in am , chem in am , may need hospice to see if survivable, no evidence infection, monitor temp, and pcx rin am, no role empiric abx at this stage, i updated husband extensive, see additional note The patient is critically ill with multiple organ systems failure and requires high complexity decision making for assessment and support, frequent evaluation and titration of therapies, application of advanced monitoring technologies and extensive interpretation of multiple databases.   Critical Care Time devoted to patient care services described in this note is35 Minutes. This time reflects time of care of this signee: Merrie Roof, MD FACP. This critical care time does not reflect procedure time, or teaching time or supervisory time of PA/NP/Med student/Med Resident etc but could involve care discussion time. Rest per NP/medical resident whose note is outlined above and that I agree with   Lavon Paganini. Titus Mould, MD, [place] Pgr: [place] [facility] Critical [facility] 06/19/2015 11:16 AM

## 2015-06-20 NOTE — ED Notes (Addendum)
Per [facility], pt here for CHF and resp distress. Pt from rehab type assisted living with husband. Is on home oxygen sating 60-70's. Placed on CPAP and sats increased to 90's. Vomited upon arrival to ED while still in truck and removed from CPAP and placed on NRB. 20g to LW. Given 4 mg of zofran. Lung sounds initially diminished and rales. Given duoneb and 5 mg albuterol

## 2015-06-20 NOTE — Progress Notes (Signed)
ETT retracted 1 cm from 25 at lip to 24 cm at lip and RR increased to 24 BPM per Dr. Titus Mould verbal order.

## 2015-06-20 NOTE — Progress Notes (Signed)
Increased RR to 30 BPM, and decreased FiO2 to 0.60 per Dr. Titus Mould verbal orders.

## 2015-06-20 NOTE — ED Notes (Signed)
Dr. Betsey Holiday at bedside updated pts husband.

## 2015-06-20 NOTE — ED Notes (Signed)
Critical Care MD at bedside.  

## 2015-06-20 NOTE — ED Notes (Signed)
Admitting MD paged regarding temperature no recommendations at this time.

## 2015-06-20 NOTE — Progress Notes (Signed)
I have had extensive discussions with family husband. We discussed patients current circumstances and organ failures. We also discussed patient's prior wishes under circumstances such as this. Family has decided to NOT perform resuscitation if arrest but to continue current medical support for now. Would allow 48 hr treatment then dni and extubation likely. Comfort if fails  Lavon Paganini. Titus Mould, MD, [place] Pgr: [place] [facility]

## 2015-06-20 NOTE — ED Notes (Signed)
Attempted report 

## 2015-06-20 NOTE — ED Provider Notes (Signed)
CSN: [id number]     Arrival date & time 05/29/2015  0544 History   First MD Initiated Contact with Patient 06/21/2015 [phone]     Chief Complaint  Patient presents with  . Respiratory Distress     (Consider location/radiation/quality/duration/timing/severity/associated sxs/prior Treatment) HPI Comments: Patient presents to the emergency department for evaluation of shortness of breath. Patient does have a history of congestive heart failure. Patient brought to the ER by ambulance from assisted living. Has been called EMS around 4 or 5 AM because of shortness of breath. EMS report that the patient was on her supplemental oxygen but her saturation was 60% when they arrived. She was tripoding and in severe distress. Patient was administered albuterol and Atrovent, placed on CPAP. Patient reportedly vomited a large volume intravenous CPAP mask during transport. At arrival, patient is somnolent, but does answer most questions appropriately. She does, however, seemed confused at times and therefore Level V Caveat.   Past Medical History  Diagnosis Date  . COPD (chronic obstructive pulmonary disease) (HCC)     nocturnal oxygen, plus daytime use with activity  . IBS (irritable bowel syndrome)   . Edema     venous insuff + cor pulmonale  . Gallstones     asymptomatic per pt (as of 09/30/2013); mild chronic elevation of Alk phos (remainder of hepatic panel wnl).  . Hernia of abdominal wall   . Hypertension   . Hyperlipidemia   . Obesity hypoventilation syndrome ([place])     "3-4 L depending on how I feel; 24/7" (09/29/2014)  . Adrenal benign tumor   . Anemia 2015    Baseline Hb 11, normocytic.  iFOB neg 10/2014.  Marland Kitchen GERD (gastroesophageal reflux disease)   . History of duodenal ulcer   . TIA (transient ischemic attack) 2012; 2014    left facial and left arm numbness since  . Chronic lower back pain     "L5-S1" (09/29/2014)  . Gout   . Anxiety and depression   . Tremor     "upper extremities; last 6-7  months" (09/28/2013)  . Multinodular goiter summer 2015    FNA 88/2/80 "Follicular lesion of undetermined significance": a follicular lesion/neoplasm can not be ruled out--Dr. Cruzita Lederer recommended repeat bx in 6-12 mo  . Chronic diastolic CHF (congestive heart failure) ([place]) 2015  . History of pneumonia "once"  . Closed right trimalleolar fracture     Surgery/fixation hardware placed.  Marland Kitchen Unspecified hypothyroidism 12/21/2013  . Type II diabetes mellitus (HCC)     hx of good control per pt report + HbA1c <7% June 2015.  Marland Kitchen History of hiatal hernia   . Migraine     "@ least monthly" (09/29/2014)  . Degenerative disc disease     Cervical and lumbar  . Osteoarthritis (arthritis due to wear and tear of joints)     "back; knees; elbows; left ankle" (09/29/2014)  . Fibromyalgia     Dx'd 1993.  This has impaired her significantly.  . Cancer of right breast ([place]) 2008    Lumpectomy + radiation+aromatase inhibitor (femara).  Regular f/u and mammos normal.  . Obesity, Class II, BMI 35-39.9   . Thrombocytopenia (HCC)     Chronic, mild.  ? Letrozole effect ?--consider getting pt f/u with oncology locally to see if letrozole can be adjusted or stopped.   Past Surgical History  Procedure Laterality Date  . Lumbar laminectomy  1991; 1996  . Orif ankle fracture bimalleolar Left 08/2011  . Breast biopsy Right 2008  .  Breast lumpectomy Right 2008  . Cataract extraction w/ intraocular lens implant Right 08/2013  . Cardiac catheterization  2000's X 2  . Combined hysterectomy vaginal / oophorectomy / a&p repair / sacrospinous ligament suspension  03/1989  . Orif ankle fracture Right 02/11/2014    Procedure: OPEN REDUCTION INTERNAL FIXATION (ORIF) ANKLE FRACTURE;  Surgeon: Alta Corning, MD;  Location: [facility];  Service: Orthopedics;  Laterality: Right;  . Transthoracic echocardiogram  03/2014    Mild LVH, EF 60-65%, grade I diast dysfxn.  Mild MR.  . Holter monitor  08/2014    48H:  NORMAL  . Back surgery    .  Fracture surgery    . Vaginal hysterectomy    . Biopsy thyroid  05/2014    "sample wasn't good; will wait 6 months and repeat biopsy"  . Pfts  01/21/15    Obstructive airway dz with unclear severity and superimposed restrictive dz   Family History  Problem Relation Age of Onset  . Arthritis Mother   . Breast cancer Mother   . Hyperlipidemia Mother   . Stroke Mother   . Hypertension Mother   . Mental illness Mother   . Arthritis Father   . Cancer Father   . Hyperlipidemia Father   . Heart disease Father   . Heart disease Maternal Grandmother   . Hyperlipidemia Maternal Grandmother   . Heart disease Paternal Grandmother   . Hypertension Paternal Grandmother   . Diabetes Paternal Grandmother   . Arthritis Paternal Grandfather    Social History  Substance Use Topics  . Smoking status: Former Smoker -- 1.00 packs/day for 36 years    Types: Cigarettes    Quit date: 09/09/2013  . Smokeless tobacco: Never Used  . Alcohol Use: No   OB History    No data available     Review of Systems  Unable to perform ROS: Acuity of condition      Allergies  Ciprofloxacin; Clindamycin/lincomycin; Fentanyl; Indocin; Keflex; Latex; Lincomycin hcl; Penicillins; and Vancomycin  Home Medications   Prior to Admission medications   Medication Sig Start Date End Date Taking? Authorizing Provider  acetaminophen (TYLENOL) 325 MG tablet Take 2 tablets (650 mg total) by mouth every 4 (four) hours as needed for headache or mild pain. 05/16/15   Silver Huguenin Elgergawy, MD  ALPRAZolam (XANAX) 0.5 MG tablet TAKE 1 TABLET BY MOUTH TWICE DAILY AS NEEDED FOR ANXIETY OR SLEEP 05/16/15   Silver Huguenin Elgergawy, MD  atorvastatin (LIPITOR) 40 MG tablet TAKE 1 TABLET BY MOUTH DAILY 03/21/15   Tammi Sou, MD  bisoprolol (ZEBETA) 5 MG tablet TAKE 1 TABLET(5 MG) BY MOUTH DAILY 02/21/15   Tammi Sou, MD  cetirizine (ZYRTEC) 10 MG tablet Take 10 mg by mouth daily as needed for allergies.    Historical Provider, MD   cholecalciferol (VITAMIN D) 1000 UNITS tablet Take 1,000 Units by mouth daily.    Historical Provider, MD  citalopram (CELEXA) 20 MG tablet TAKE 1 TABLET BY MOUTH EVERY DAY 03/30/15   Tammi Sou, MD  clopidogrel (PLAVIX) 75 MG tablet Take 1 tablet (75 mg total) by mouth daily. 06/17/14   Tammi Sou, MD  ferrous sulfate 325 (65 FE) MG tablet Take 325 mg by mouth 2 (two) times daily with a meal.    Historical Provider, MD  furosemide (LASIX) 40 MG tablet Take 1.5 tablets (60 mg total) by mouth 2 (two) times daily. 05/16/15   Albertine Patricia, MD  HYDROcodone-acetaminophen (  NORCO) 7.5-325 MG tablet Take 1 tablet by mouth every 8 (eight) hours as needed (pain). 05/16/15   Silver Huguenin Elgergawy, MD  insulin aspart (NOVOLOG) 100 UNIT/ML injection Inject 0-9 Units into the skin 3 (three) times daily with meals. 05/16/15   Silver Huguenin Elgergawy, MD  insulin glargine (LANTUS) 100 UNIT/ML injection Inject 0.2 mLs (20 Units total) into the skin 2 (two) times daily. 05/16/15   Silver Huguenin Elgergawy, MD  letrozole (FEMARA) 2.5 MG tablet TAKE 1 TABLET BY MOUTH EVERY DAY 09/27/14   Tammi Sou, MD  levothyroxine (SYNTHROID, LEVOTHROID) 100 MCG tablet TAKE 1 TABLET BY MOUTH EVERY DAY BEFORE BREAKFAST 12/23/14   Tammi Sou, MD  Linaclotide [facility]) 145 MCG CAPS capsule Take 1 capsule (145 mcg total) by mouth daily. 03/30/15   Tammi Sou, MD  Magnesium Oxide 500 MG (LAX) TABS Take 1,000 mg by mouth daily.     Historical Provider, MD  meclizine (ANTIVERT) 12.5 MG tablet TAKE 1 TO 2 TABLETS BY MOUTH EVERY 6 HOURS AS NEEDED FOR NAUSEA OR DIZZINESS 03/29/15   Tammi Sou, MD  morphine (MS CONTIN) 30 MG 12 hr tablet Take 1 tablet (30 mg total) by mouth every 12 (twelve) hours. 05/16/15   Silver Huguenin Elgergawy, MD  Multiple Vitamins-Minerals (MULTIVITAMIN PO) Take 1 tablet by mouth daily.    Historical Provider, MD  NON FORMULARY Place 3-3.5 L into the nose continuous. Copd/emphesema    Historical Provider, MD   omeprazole (PRILOSEC) 40 MG capsule TAKE 1 CAPSULE BY MOUTH TWICE DAILY 03/08/15   Tammi Sou, MD  pregabalin (LYRICA) 150 MG capsule 1 tab po tid 03/30/15   Tammi Sou, MD  rOPINIRole (REQUIP) 2 MG tablet TAKE 1 TABLET BY MOUTH TWICE DAILY 02/28/15   Tammi Sou, MD   BP 114/43 mmHg  Pulse 73  Temp(Src) 99.7 F (37.6 C) (Rectal)  Resp 18  Ht 5' 3"  (1.6 m)  Wt 235 lb 14.3 oz (107 kg)  BMI 41.80 kg/m2  SpO2 96% Physical Exam  Constitutional: She appears well-developed and well-nourished. No distress.  HENT:  Head: Normocephalic and atraumatic.  Right Ear: Hearing normal.  Left Ear: Hearing normal.  Nose: Nose normal.  Mouth/Throat: Oropharynx is clear and moist and mucous membranes are normal.  Eyes: Conjunctivae and EOM are normal. Pupils are equal, round, and reactive to light.  Neck: Normal range of motion. Neck supple.  Cardiovascular: Regular rhythm, S1 normal and S2 normal.  Tachycardia present.  Exam reveals no gallop and no friction rub.   No murmur heard. Pulmonary/Chest: Accessory muscle usage present. She is in respiratory distress. She has decreased breath sounds (decreased throughout, but absent at right base). She has rhonchi. She has rales. She exhibits no tenderness.  Abdominal: Soft. Normal appearance and bowel sounds are normal. There is no hepatosplenomegaly. There is no tenderness. There is no rebound, no guarding, no tenderness at McBurney's point and negative Murphy's sign. No hernia.  Musculoskeletal: Normal range of motion.  Neurological: She is alert. She has normal strength. She is disoriented. No cranial nerve deficit or sensory deficit. Coordination normal. GCS eye subscore is 4. GCS verbal subscore is 4. GCS motor subscore is 6.  Skin: Skin is warm, dry and intact. No rash noted. No cyanosis.  Nursing note and vitals reviewed.   ED Course  Procedures (including critical care time) Labs Review Labs Reviewed  CBC WITH  DIFFERENTIAL/PLATELET - Abnormal; Notable for the following:    WBC  13.8 (*)    Hemoglobin 11.6 (*)    MCH 25.3 (*)    Platelets 148 (*)    Neutro Abs 11.9 (*)    All other components within normal limits  I-STAT ARTERIAL BLOOD GAS, ED - Abnormal; Notable for the following:    pH, Arterial 7.150 (*)    pCO2 arterial 96.7 (*)    pO2, Arterial 49.0 (*)    Bicarbonate 33.5 (*)    All other components within normal limits  URINE CULTURE  CULTURE, BLOOD (ROUTINE X 2)  CULTURE, BLOOD (ROUTINE X 2)  COMPREHENSIVE METABOLIC PANEL  TROPONIN I  BRAIN NATRIURETIC PEPTIDE  URINALYSIS, ROUTINE W REFLEX MICROSCOPIC (NOT AT [facility])  I-STAT CG4 LACTIC ACID, ED    Imaging Review Dg Chest Portable 1 View  06/05/2015  CLINICAL DATA:  ETT placement EXAM: PORTABLE CHEST 1 VIEW COMPARISON:  06/12/2015 FINDINGS: NG tube in stomach. The lung volumes and bibasilar atelectasis. There is perihilar airspace disease greater on the RIGHT which is not changed prior. No pneumothorax. IMPRESSION: 1. Endotracheal tube 1.5 cm from carina.  NG tube in stomach. 2. No Change in asymmetric airspace disease greater on the RIGHT. 3. Bilateral effusions, greater on the RIGHT Electronically Signed   By: Suzy Bouchard M.D.   On: 06/18/2015 07:12   Dg Chest Port 1 View  06/17/2015  CLINICAL DATA:  Acute onset of shortness of breath. Initial encounter. EXAM: PORTABLE CHEST 1 VIEW COMPARISON:  Chest radiograph performed 05/11/2015 FINDINGS: A moderate right-sided pleural effusion is noted. Bilateral airspace opacification, worse on the right, may reflect asymmetric pulmonary edema or pneumonia. No pneumothorax is seen. The cardiomediastinal silhouette is mildly enlarged. No acute osseous abnormalities are identified. IMPRESSION: Moderate right-sided pleural effusion noted. Bilateral airspace opacification, worse on the right, may reflect asymmetric pulmonary edema or pneumonia. Mild cardiomegaly. Electronically Signed   By: Garald Balding M.D.   On: 06/07/2015 06:21   I have personally reviewed and evaluated these images and lab results as part of my medical decision-making.   EKG Interpretation   Date/Time:  Monday June 20 2015 05:52:14 EST Ventricular Rate:  98 PR Interval:  230 QRS Duration: 105 QT Interval:  333 QTC Calculation: 425 R Axis:   12 Text Interpretation:  Sinus rhythm Prolonged PR interval Anteroseptal  infarct, old Nonspecific repol abnormality, lateral leads Confirmed by  Kawhi Diebold  MD, Konstantinos Cordoba [phone]) on 06/06/2015 6:02:33 AM      MDM   Final diagnoses:  Acute pulmonary edema (HCC)  Pleural effusion on right  Acute on chronic respiratory failure with hypoxia and hypercapnia [facility])  Hypertensive emergency    Patient presents to the emergency department for evaluation of shortness of breath. Patient has a history of heart failure. She was hospitalized last month for congestive heart failure exacerbation. It appears that her Lasix was increased to 60 mg twice daily at that time. She has been taking this dose since discharge. Patient presented to the ER in severe distress. Patient had been treated with bronchodilator therapy and CPAP during transport. Unfortunately she vomited during transport and appears to have aspirated.  Chest x-ray shows pleural effusion with evidence of pulmonary edema. Patient initially indicated to the paramedic that she did not want to be intubated, but she is acutely confused. Records from her hospitalization last month reflect full code. Additionally, home visiting nurse saw her last night. Her nurse did call to the ER this morning and tells me that she confirmed that the patient last night  that she wants to be intubated if necessary, she is a full code. Patient was therefore intubated emergently. She had evidence of severe hypercarbia and hypoxia. This has improved with intubation.  Patient was extremely hypertensive initially. She has been placed on a  nitroglycerin drip and her blood pressure is improving.  Discussed with Dr. Corrie Dandy, critical care. She will be admitted to the ICU.  CRITICAL CARE Performed by: Orpah Greek   Total critical care time: 35 minutes  Critical care time was exclusive of separately billable procedures and treating other patients.  Critical care was necessary to treat or prevent imminent or life-threatening deterioration.  Critical care was time spent personally by me on the following activities: development of treatment plan with patient and/or surrogate as well as nursing, discussions with consultants, evaluation of patient's response to treatment, examination of patient, obtaining history from patient or surrogate, ordering and performing treatments and interventions, ordering and review of laboratory studies, ordering and review of radiographic studies, pulse oximetry and re-evaluation of patient's condition.     Orpah Greek, MD 06/09/2015 [phone]

## 2015-06-20 NOTE — ED Notes (Signed)
Family at bedside. Dr. Betsey Holiday at the bedside.

## 2015-06-20 NOTE — ED Provider Notes (Signed)
INTUBATION Performed by: Faustino Congress, Dr. Betsey Holiday  Required items: required blood products, implants, devices, and special equipment available Patient identity confirmed: provided demographic data and hospital-assigned identification number Time out: Immediately prior to procedure a "time out" was called to verify the correct patient, procedure, equipment, support staff and site/side marked as required.  Indications: respiratory failure  Intubation method: Glidescope Laryngoscopy   Preoxygenation: BVM  Sedatives: Etomidate Paralytic: Succinylcholine  Tube Size: 7 cuffed  Post-procedure assessment: chest rise and ETCO2 monitor Breath sounds: equal and absent over the epigastrium Tube secured with: ETT holder Chest x-ray interpreted by radiologist and me.  Chest x-ray findings: endotracheal tube in appropriate position  Patient tolerated the procedure well with no immediate complications.    Carlisle Cater, PA-C 05/28/2015 [place], MD 06/11/2015 [phone]

## 2015-06-20 NOTE — ED Notes (Signed)
Dr[facility] Betsey Holiday [facility] pt

## 2015-06-21 ENCOUNTER — Inpatient Hospital Stay (HOSPITAL_COMMUNITY): Payer: Medicare Other

## 2015-06-21 DIAGNOSIS — I5030 Unspecified diastolic (congestive) heart failure: Secondary | ICD-10-CM | POA: Insufficient documentation

## 2015-06-21 DIAGNOSIS — J9600 Acute respiratory failure, unspecified whether with hypoxia or hypercapnia: Secondary | ICD-10-CM

## 2015-06-21 LAB — CBC
HCT: 28.7 % — ABNORMAL LOW (ref 36.0–46.0)
HEMOGLOBIN: 9.1 g/dL — AB (ref 12.0–15.0)
MCH: 24 pg — AB (ref 26.0–34.0)
MCHC: 31.7 g/dL (ref 30.0–36.0)
MCV: 75.7 fL — AB (ref 78.0–100.0)
PLATELETS: 98 10*3/uL — AB (ref 150–400)
RBC: 3.79 MIL/uL — AB (ref 3.87–5.11)
RDW: 14.2 % (ref 11.5–15.5)
WBC: 7 10*3/uL (ref 4.0–10.5)

## 2015-06-21 LAB — BASIC METABOLIC PANEL
ANION GAP: 13 (ref 5–15)
BUN: 46 mg/dL — AB (ref 6–20)
CHLORIDE: 93 mmol/L — AB (ref 101–111)
CO2: 32 mmol/L (ref 22–32)
Calcium: 8.8 mg/dL — ABNORMAL LOW (ref 8.9–10.3)
Creatinine, Ser: 1.2 mg/dL — ABNORMAL HIGH (ref 0.44–1.00)
GFR calc Af Amer: 52 mL/min — ABNORMAL LOW (ref >60)
GFR, EST NON AFRICAN AMERICAN: 45 mL/min — AB (ref >60)
Glucose, Bld: 108 mg/dL — ABNORMAL HIGH (ref 65–99)
POTASSIUM: 4 mmol/L (ref 3.5–5.1)
Sodium: 138 mmol/L (ref 135–145)

## 2015-06-21 LAB — CBC WITH DIFFERENTIAL/PLATELET
Basophils Absolute: 0 10*3/uL (ref 0.0–0.1)
Basophils Relative: 0 %
Eosinophils Absolute: 0 10*3/uL (ref 0.0–0.7)
Eosinophils Relative: 0 %
HEMATOCRIT: 29.3 % — AB (ref 36.0–46.0)
HEMOGLOBIN: 9.2 g/dL — AB (ref 12.0–15.0)
LYMPHS ABS: 0.5 10*3/uL — AB (ref 0.7–4.0)
LYMPHS PCT: 5 %
MCH: 24.3 pg — AB (ref 26.0–34.0)
MCHC: 31.4 g/dL (ref 30.0–36.0)
MCV: 77.3 fL — AB (ref 78.0–100.0)
MONOS PCT: 3 %
Monocytes Absolute: 0.2 10*3/uL (ref 0.1–1.0)
NEUTROS ABS: 8.5 10*3/uL — AB (ref 1.7–7.7)
Neutrophils Relative %: 92 %
Platelets: 107 10*3/uL — ABNORMAL LOW (ref 150–400)
RBC: 3.79 MIL/uL — AB (ref 3.87–5.11)
RDW: 14.5 % (ref 11.5–15.5)
WBC: 9.2 10*3/uL (ref 4.0–10.5)

## 2015-06-21 LAB — GLUCOSE, CAPILLARY
Glucose-Capillary: 104 mg/dL — ABNORMAL HIGH (ref 65–99)
Glucose-Capillary: 121 mg/dL — ABNORMAL HIGH (ref 65–99)
Glucose-Capillary: 161 mg/dL — ABNORMAL HIGH (ref 65–99)
Glucose-Capillary: 158 mg/dL — ABNORMAL HIGH (ref 65–99)

## 2015-06-21 LAB — URINE CULTURE: Culture: NO GROWTH

## 2015-06-21 MED ORDER — VITAL HIGH PROTEIN PO LIQD
1000.0000 mL | ORAL | Status: DC
  Administered 2015-06-21: 1000 mL

## 2015-06-21 MED ORDER — ROPINIROLE HCL 1 MG PO TABS
2.0000 mg | ORAL_TABLET | Freq: Two times a day (BID) | ORAL | Status: DC
  Administered 2015-06-21 – 2015-06-26 (×9): 2 mg via ORAL
  Filled 2015-06-21 (×10): qty 2

## 2015-06-21 MED ORDER — HYDRALAZINE HCL 20 MG/ML IJ SOLN
20.0000 mg | INTRAMUSCULAR | Status: DC | PRN
  Administered 2015-06-21 – 2015-06-26 (×15): 20 mg via INTRAVENOUS
  Filled 2015-06-21 (×17): qty 1

## 2015-06-21 MED ORDER — HYDRALAZINE HCL 20 MG/ML IJ SOLN
10.0000 mg | INTRAMUSCULAR | Status: DC | PRN
  Administered 2015-06-21 (×2): 10 mg via INTRAVENOUS
  Filled 2015-06-21 (×2): qty 1

## 2015-06-21 MED ORDER — METOPROLOL TARTRATE 50 MG PO TABS
50.0000 mg | ORAL_TABLET | Freq: Two times a day (BID) | ORAL | Status: DC
  Administered 2015-06-21 – 2015-06-22 (×3): 50 mg via ORAL
  Filled 2015-06-21 (×3): qty 1

## 2015-06-21 MED ORDER — VITAL HIGH PROTEIN PO LIQD
1000.0000 mL | ORAL | Status: DC
  Administered 2015-06-22: 1000 mL
  Filled 2015-06-21 (×2): qty 1000

## 2015-06-21 MED ORDER — SENNOSIDES 8.8 MG/5ML PO SYRP
5.0000 mL | ORAL_SOLUTION | Freq: Two times a day (BID) | ORAL | Status: DC
  Administered 2015-06-21 – 2015-06-26 (×4): 5 mL via ORAL
  Filled 2015-06-21 (×14): qty 5

## 2015-06-21 MED ORDER — LACTULOSE 10 GM/15ML PO SOLN
20.0000 g | Freq: Two times a day (BID) | ORAL | Status: AC
  Administered 2015-06-21 – 2015-06-22 (×2): 20 g via ORAL
  Filled 2015-06-21 (×2): qty 30

## 2015-06-21 MED ORDER — FENTANYL CITRATE (PF) 100 MCG/2ML IJ SOLN
25.0000 ug | Freq: Three times a day (TID) | INTRAMUSCULAR | Status: DC
  Administered 2015-06-21 – 2015-06-26 (×12): 25 ug via INTRAVENOUS
  Filled 2015-06-21 (×12): qty 2

## 2015-06-21 MED ORDER — HYDROCODONE-ACETAMINOPHEN 7.5-325 MG/15ML PO SOLN
10.0000 mL | ORAL | Status: DC | PRN
  Administered 2015-06-22: 10 mL via ORAL
  Filled 2015-06-21: qty 15

## 2015-06-21 NOTE — Progress Notes (Signed)
Patient Blood pressure increasing throughout the day and into early this morning. Called Elink to get PRN order of Hydralazine. Will continue to monitor patient. Gildardo Cranker, RN

## 2015-06-21 NOTE — H&P (Addendum)
PULMONARY / CRITICAL CARE MEDICINE   Name: Brooke Mccullough MRN: [id number] DOB: [birth year]    ADMISSION DATE:  06/05/2015 CONSULTATION DATE: 06/07/2015  REFERRING MD:  Titus Mould  CHIEF COMPLAINT:  Respiratory distress  HISTORY OF PRESENT ILLNESS:  Brooke Mccullough is a 70 yo white female with multiple co-morbid illness such as COPD, HTN, IBS, hyperlipidemia, obesity , bening adrenal tumor, TIA in 2012 and 14, chronic lower back pain , anxiety , depression ,CHF, goiter,holter monitoring. She lives in the rehab type assisted living facility  Admitted resp failure, diastolic HF.  SUBJECTIVE:  Had some HTN Neg 3 liters  VITAL SIGNS: BP 190/53 mmHg  Pulse 103  Temp(Src) 100.4 F (38 C) (Core (Comment))  Resp 29  Ht 5\' 5"  (1.651 m)  Wt 107 kg (235 lb 14.3 oz)  BMI 39.25 kg/m2  SpO2 95%  HEMODYNAMICS:    VENTILATOR SETTINGS: Vent Mode:  [-] PSV;CPAP FiO2 (%):  [40 %-60 %] 40 % Set Rate:  [30 bmp] 30 bmp Vt Set:  [420 mL] 420 mL PEEP:  [5 cmH20] 5 cmH20 Pressure Support:  [5 cmH20-10 cmH20] 10 cmH20 Plateau Pressure:  [24 cmH20-26 cmH20] 26 cmH20  INTAKE / OUTPUT: I/O last 3 completed shifts: In: 649.7 [I.V.:189.7; NG/GT:60; IV Piggyback:400] Out: [id number] [Urine:3375; Emesis/NG output:275]  PHYSICAL EXAMINATION: General:  Morbidly obese , white female Neuro:  Atraumatic, normocephalic, white sclera HEENT: PERRLA,, ett Cardiovascular:  , S1S2, Regular, no MRG noted Lungs: Bibasilar crackles improved significantly Abdomen:  Obese, proturbent. nontender  Skin: Intact, no rash, ecchymosis noted  LABS:  BMET  Recent Labs Lab 05/27/2015 0550 06/18/2015 1617 06/21/15 0415  NA 130* 132* 138  K 3.8 4.5 4.0  CL 88* 90* 93*  CO2 32 29 32  BUN 42* 47* 46*  CREATININE 1.12* 1.38* 1.20*  GLUCOSE 167* 106* 108*    Electrolytes  Recent Labs Lab 05/29/2015 0550 05/28/2015 1617 06/21/15 0415  CALCIUM 8.8* 8.7* 8.8*    CBC  Recent Labs Lab 05/27/2015 0550  06/21/15 0415  WBC 13.8* 7.0  HGB 11.6* 9.1*  HCT 36.5 28.7*  PLT 148* 98*    Coag's No results for input(s): APTT, INR in the last 168 hours.  Sepsis Markers  Recent Labs Lab 06/16/2015 0608  LATICACIDVEN 1.96    ABG  Recent Labs Lab 06/15/2015 0620 05/26/2015 1008 06/01/2015 1341  PHART 7.150* 7.278* 7.386  PCO2ART 96.7* 67.5* 53.6*  PO2ART 49.0* 128.0* 94.6    Liver Enzymes  Recent Labs Lab 06/09/2015 0550  AST 24  ALT 14  ALKPHOS 110  BILITOT 0.5  ALBUMIN 3.0*    Cardiac Enzymes  Recent Labs Lab 06/02/2015 0550  TROPONINI 0.06*    Glucose  Recent Labs Lab 05/25/2015 1057 05/29/2015 1208 06/03/2015 1633 05/31/2015 2252 06/21/15 0410 06/21/15 0823  GLUCAP 124* 104* 101* 119* 104* 121*    Imaging Dg Chest Port 1 View  06/21/2015  CLINICAL DATA:  Intubation. EXAM: PORTABLE CHEST 1 VIEW COMPARISON:  06/05/2015. FINDINGS: Endotracheal tube and NG tube in stable position. Cardiomegaly with bilateral pulmonary infiltrates/edema slightly improved from prior exam. Small right pleural effusion. No pneumothorax. No left pleural effusion noted on today's exam . IMPRESSION: 1. Lines and tubes in stable position. 2. Cardiomegaly with bilateral pulmonary infiltrates/ edema, right side greater left, improved from prior exam . Interim clearing of left pleural effusion. Interim improvement of small right pleural effusion . Electronically Signed   By: Marcello Moores  Register   On: 06/21/2015 07:09     STUDIES:  None  CULTURES: UA> 2/27 negatiVE  ANTIBIOTICS: none  SIGNIFICANT EVENTS: ED admit>2/27 Intubated>2/27  LINES/TUBES: ett 2/27>>>  DISCUSSION: 13 year white female with multiple co-morbid illness was brought to the [facility] on 06/12/2015  With  Respiratory distress due to  Exacerbation of CHF and Hypertension and pulmonary edema. ASSESSMENT / PLAN:  PULMONARY A: Acute on chronic respiratory failure. COPD R/o PNA rt base P:   DNI Neg balance as tolerated consider  abx with appearance rt base , rpeat pcx rin am  ABG reviewed, keep same MV Wean sbt okay today, goal cpap 5 ps 5  CARDIOVASCULAR A:  CHF Hypertension likely cauised this pulm edema in setting diastolic HF Hyperlipidemia Obesity Hypoventilation syndrome P:  Diurese  Tele See renal Add BB home Increase prn hydral  RENAL A:    Acute Kidney Injury Hyponatremia resolved Mild thrombocytopenia P:   bmet in am  kvo Maintain lasix further  GASTROINTESTINAL A:   Hx IBS GERD P:   Hold home med  protonix  Start tube feedings   HEMATOLOGIC A:    Mild Anaemia Drop in plat, has been that low in january P:   trend cbc plat in am  Sub q hep okay tomaintain  INFECTIOUS A:   Leukocytosis Rt base , r/o asp PNA P: linazolid , mero, likley narrow in am  Follow sputum, pcxr ina m   ENDOCRINE A:   Hypothyroidism DM, glu controlled P:   Continue synthroid. SSI coverage  NEUROLOGIC A:   Anxiety Depression Acute encephalopathy? Migraine P:   Hold Alprazolam RASS goal: 0- -1 Versed prn fent schedule to avoid WD    FAMILY  - Updates:  Family was updated bedside 2/28  - Inter-disciplinary family meet or Palliative Care meeting due by: 06/24/15  Ccm time 30 min   Lavon Paganini. Titus Mould, MD, Lydia Pgr: [facility] 06/21/2015 10:34 AM

## 2015-06-21 NOTE — Clinical Social Work Note (Signed)
Clinical Social Work Assessment  Patient Details  Name: Anona Bree MRN: [id number] Date of Birth: Oct 02, 1945  Date of referral:  06/21/15               Reason for consult:  Facility Placement                Permission sought to share information with:  Family Supports, Customer service manager Permission granted to share information::  Yes, Verbal Permission Granted  Name::     Tamana Wissmann (Husband) [phone]  Agency::  [facility]  Relationship::     Contact Information:     Housing/Transportation Living arrangements for the past 2 months:  [place], Charity fundraiser of Information:  Spouse Patient Interpreter Needed:  None Criminal Activity/Legal Involvement Pertinent to Current Situation/Hospitalization:  No - Comment as needed Significant Relationships:  Spouse Lives with:  Spouse Do you feel safe going back to the place where you live?  Yes Need for family participation in patient care:  Yes (Comment)  Care giving concerns:  No care giving concerns identified at this time   Facilities manager / plan:  CSW engaged with Patient at her bedside alongside Patient's husband. Patient is currently intubated. CSW introduced self, role of CSW, and began discussion of discharge planning. Patient recently returned to her independent living facility [facility] after spending about 5 weeks at the facilities SNF [facility] Patient's husband reports that Patient may need to go to skilled nursing facility to regain her mobility and strength prior to returning to their independent living facility. Patient's husband reports that the ultimate goal is for Patient to return to prior level of independence after skilled nursing facility placement. CSW will facilitate transport back to [facility] when Patient is medically cleared for discharge. Patient and family agreeable to plan.    Employment status:  Retired Forensic scientist:  Medicare PT Recommendations:  Not assessed at this time Information / Referral to community resources:  [place]  Patient/Family's Response to care:  Patient's husband reports sincere appreciation for Patient's current care. Patient's husband reports that the ultimate goal is for Patient to return to prior level of independence after skilled nursing facility placement.   Patient/Family's Understanding of and Emotional Response to Diagnosis, Current Treatment, and Prognosis:  Patient's husband verbalizes understanding of Patient's current diagnosis, treatment, and prognosis. Patient is agreeable to skilled nursing facility at this time.   Emotional Assessment Appearance:  Appears stated age Attitude/Demeanor/Rapport:  Intubated (Following Commands or Not Following Commands) Affect (typically observed):  Appropriate Orientation:  Oriented to Self, Oriented to  Time, Oriented to Place Alcohol / Substance use:  Not Applicable Psych involvement (Current and /or in the community):  No (Comment)  Discharge Needs  Concerns to be addressed:  No discharge needs identified Readmission within the last 30 days:  Yes Current discharge risk:  None Barriers to Discharge:  Continued Medical Work up   [employer], LCSW 06/21/2015, 1:43 PM

## 2015-06-21 NOTE — Progress Notes (Signed)
eLink Physician-Brief Progress Note Patient Name: Brooke Mccullough DOB: [birth year] MRN: [id number]   Date of Service  06/21/2015  HPI/Events of Note  RN called for BP 170/100. Comfortable. Just received lasix at [place]  eICU Interventions  Prn hydralazine ordered.      Intervention Category Intermediate Interventions: Hypertension - evaluation and management  [place] North 06/21/2015, 3:13 AM

## 2015-06-21 NOTE — Progress Notes (Signed)
Initial Nutrition Assessment  DOCUMENTATION CODES:   Obesity unspecified  INTERVENTION:  -Begin permitted hypocaloric feedings with Vital High Protein @ 34mL/hr via OGT, increase by 10 every 8 hours to goal rate of 76mL/hr. -Tubefeeding regimen provides 1300 calories, 114g protein, 1087cc free H2O  NUTRITION DIAGNOSIS:   Inadequate oral intake related to inability to eat as evidenced by NPO status.  GOAL:   Provide needs based on ASPEN/SCCM guidelines  MONITOR:   Vent status, Labs, Weight trends, TF tolerance, I & O's, Skin  REASON FOR ASSESSMENT:   Ventilator, Consult Enteral/tube feeding initiation and management  ASSESSMENT:   Brooke Mccullough is a 70 yo white female with multiple co-morbid illness such as COPD, HTN, IBS, hyperlipidemia, obesity , bening adrenal tumor, TIA in 2012 and 14, chronic lower back pain , anxiety , depression ,CHF, goiter,holter monitoring. She lives in the rehab type assisted living facility Admitted resp failure, diastolic HF  Patient is currently intubated on ventilator support MV: 11.1 L/min Temp (24hrs), Avg:100.5 F (38.1 C), Min:98.8 F (37.1 C), Max:102.2 F (39 C)  Propofol: None  Spoke with Brooke Mccullough at bedside. He endorses pt has had a poor appetite for approximately 1 year now. States that she doesn't eat much, but when her appetite "comes on" she eats a lot. Claims her most recent BM was this past Saturday, but stated she has had issues with constipation for her entire life. Would go 2-3 weeks without a BM at sometimes. Has bedside comode at home because she can't walk.  Stated she spent 10 months of last year in the hospital and was in rehab for 5 weeks prior to this admission. She came home for 2 days PTA. Denies weight loss. Endorses she vomited 3 times the night of the ED visit.  Unable to complete Nutrition-Focused physical exam at this time.   Labs: CBGs 104-161 Medications: Apresoline PRN, Versed PRN, Fentanyl  drip Diet Order:  Diet NPO time specified  Skin:  Reviewed, no issues  Last BM:  PTA  Height:   Ht Readings from Last 1 Encounters:  06/19/2015 5\' 5"  (1.651 m)    Weight:   Wt Readings from Last 1 Encounters:  06/21/15 235 lb 14.3 oz (107 kg)    Ideal Body Weight:  56.81 kg  BMI:  Body mass index is 39.25 kg/(m^2).  Estimated Nutritional Needs:   Kcal:  [id number]  Protein:  114grams  Fluid:  Per MD  EDUCATION NEEDS:   No education needs identified at this time  Brooke Mccullough. Brooke Paske, MS, RD LDN After Hours/Weekend Pager [phone]

## 2015-06-21 NOTE — Care Management Note (Signed)
Case Management Note  Patient Details  Name: Brooke Mccullough MRN: [id number] Date of Birth: February 21, 1946  Subjective/Objective:     Adm w resp failure, vent               Action/Plan:lives w husb in alf per chart. sw ref placed   Expected Discharge Date:                  Expected Discharge Plan:     In-House Referral:     Discharge planning Services     Post Acute Care Choice:    Choice offered to:     DME Arranged:    DME Agency:     HH Arranged:    [place] Agency:     Status of Service:     Medicare Important Message Given:    Date Medicare IM Given:    Medicare IM give by:    Date Additional Medicare IM Given:    Additional Medicare Important Message give by:     If discussed at [place] of Stay Meetings, dates discussed:    Additional Comments: ur review done  Lacretia Leigh, RN 06/21/2015, 7:49 AM

## 2015-06-21 NOTE — Progress Notes (Signed)
eLink Physician-Brief Progress Note Patient Name: Brooke Mccullough DOB: 1945-05-17 MRN: [id number]   Date of Service  06/21/2015  HPI/Events of Note  C/o pain and constipation. Was on home norco and MS contin.   eICU Interventions  Will add norco and bowel regimen.      Intervention Category Intermediate Interventions: Abdominal pain - evaluation and management Minor Interventions: Other:  Laverle Hobby 06/21/2015, 6:50 PM

## 2015-06-22 ENCOUNTER — Inpatient Hospital Stay (HOSPITAL_COMMUNITY): Payer: Medicare Other

## 2015-06-22 DIAGNOSIS — J948 Other specified pleural conditions: Secondary | ICD-10-CM

## 2015-06-22 DIAGNOSIS — I5031 Acute diastolic (congestive) heart failure: Secondary | ICD-10-CM

## 2015-06-22 DIAGNOSIS — L8990 Pressure ulcer of unspecified site, unspecified stage: Secondary | ICD-10-CM

## 2015-06-22 DIAGNOSIS — J9621 Acute and chronic respiratory failure with hypoxia: Principal | ICD-10-CM

## 2015-06-22 DIAGNOSIS — J9622 Acute and chronic respiratory failure with hypercapnia: Secondary | ICD-10-CM

## 2015-06-22 LAB — BASIC METABOLIC PANEL
ANION GAP: 13 (ref 5–15)
BUN: 43 mg/dL — AB (ref 6–20)
CO2: 31 mmol/L (ref 22–32)
Calcium: 9 mg/dL (ref 8.9–10.3)
Chloride: 98 mmol/L — ABNORMAL LOW (ref 101–111)
Creatinine, Ser: 0.91 mg/dL (ref 0.44–1.00)
Glucose, Bld: 200 mg/dL — ABNORMAL HIGH (ref 65–99)
POTASSIUM: 3.2 mmol/L — AB (ref 3.5–5.1)
SODIUM: 142 mmol/L (ref 135–145)

## 2015-06-22 LAB — MAGNESIUM: MAGNESIUM: 1.9 mg/dL (ref 1.7–2.4)

## 2015-06-22 LAB — GLUCOSE, CAPILLARY
Glucose-Capillary: 137 mg/dL — ABNORMAL HIGH (ref 65–99)
Glucose-Capillary: 155 mg/dL — ABNORMAL HIGH (ref 65–99)
Glucose-Capillary: 162 mg/dL — ABNORMAL HIGH (ref 65–99)
Glucose-Capillary: 163 mg/dL — ABNORMAL HIGH (ref 65–99)
Glucose-Capillary: 169 mg/dL — ABNORMAL HIGH (ref 65–99)
Glucose-Capillary: 178 mg/dL — ABNORMAL HIGH (ref 65–99)
Glucose-Capillary: 188 mg/dL — ABNORMAL HIGH (ref 65–99)
Glucose-Capillary: 205 mg/dL — ABNORMAL HIGH (ref 65–99)

## 2015-06-22 LAB — PROCALCITONIN: Procalcitonin: 5.55 ng/mL

## 2015-06-22 LAB — PHOSPHORUS: PHOSPHORUS: 3 mg/dL (ref 2.5–4.6)

## 2015-06-22 MED ORDER — MIDAZOLAM HCL 2 MG/2ML IJ SOLN
1.0000 mg | INTRAMUSCULAR | Status: DC | PRN
  Administered 2015-06-22 – 2015-06-24 (×14): 1 mg via INTRAVENOUS
  Filled 2015-06-22 (×15): qty 2

## 2015-06-22 MED ORDER — FENTANYL CITRATE (PF) 100 MCG/2ML IJ SOLN
100.0000 ug | INTRAMUSCULAR | Status: DC | PRN
  Administered 2015-06-22 – 2015-06-25 (×14): 100 ug via INTRAVENOUS
  Filled 2015-06-22 (×13): qty 2

## 2015-06-22 MED ORDER — DEXTROSE 5 % IV SOLN
1.0000 g | INTRAVENOUS | Status: AC
  Administered 2015-06-22 – 2015-06-24 (×3): 1 g via INTRAVENOUS
  Filled 2015-06-22 (×3): qty 10

## 2015-06-22 MED ORDER — HYDRALAZINE HCL 25 MG PO TABS
25.0000 mg | ORAL_TABLET | Freq: Three times a day (TID) | ORAL | Status: DC
  Administered 2015-06-22 – 2015-06-23 (×4): 25 mg
  Filled 2015-06-22 (×3): qty 1

## 2015-06-22 MED ORDER — FENTANYL CITRATE (PF) 100 MCG/2ML IJ SOLN
INTRAMUSCULAR | Status: AC
  Administered 2015-06-22: 100 ug via INTRAVENOUS
  Filled 2015-06-22: qty 2

## 2015-06-22 MED ORDER — DEXMEDETOMIDINE HCL IN NACL 400 MCG/100ML IV SOLN
0.0000 ug/kg/h | INTRAVENOUS | Status: DC
  Administered 2015-06-22: 0.3 ug/kg/h via INTRAVENOUS
  Administered 2015-06-22: 1.2 ug/kg/h via INTRAVENOUS
  Administered 2015-06-23: 0.5 ug/kg/h via INTRAVENOUS
  Administered 2015-06-23 (×2): 1.2 ug/kg/h via INTRAVENOUS
  Administered 2015-06-24: 0.5 ug/kg/h via INTRAVENOUS
  Administered 2015-06-24: 1 ug/kg/h via INTRAVENOUS
  Administered 2015-06-24: 0.5 ug/kg/h via INTRAVENOUS
  Administered 2015-06-25: 0.7 ug/kg/h via INTRAVENOUS
  Filled 2015-06-22 (×2): qty 100
  Filled 2015-06-22: qty 50
  Filled 2015-06-22 (×6): qty 100

## 2015-06-22 MED ORDER — MAGNESIUM SULFATE 50 % IJ SOLN
2.0000 g | Freq: Once | INTRAVENOUS | Status: DC
  Filled 2015-06-22: qty 4

## 2015-06-22 MED ORDER — POTASSIUM CHLORIDE 20 MEQ/15ML (10%) PO SOLN
40.0000 meq | ORAL | Status: AC
  Administered 2015-06-22 (×2): 40 meq
  Filled 2015-06-22 (×2): qty 30

## 2015-06-22 MED ORDER — MAGNESIUM SULFATE 2 GM/50ML IV SOLN
2.0000 g | Freq: Once | INTRAVENOUS | Status: AC
  Administered 2015-06-22: 2 g via INTRAVENOUS
  Filled 2015-06-22: qty 50

## 2015-06-22 NOTE — Progress Notes (Signed)
eLink Physician-Brief Progress Note Patient Name: Brooke Mccullough DOB: 08/08/1945 MRN: [id number]   Date of Service  06/22/2015  HPI/Events of Note  Patient trying to pull out ETT.   eICU Interventions  Will increase Versed to 1 mg IV Q 1 hour PRN.     Intervention Category Minor Interventions: Agitation / anxiety - evaluation and management  Carlson Belland Eugene 06/22/2015, 12:52 AM

## 2015-06-22 NOTE — Progress Notes (Signed)
Increased PS to 15 d/t RR 40's on 10 PS.  Pt slightly agitated, RN at bedside and aware.

## 2015-06-22 NOTE — Progress Notes (Signed)
MD had placed pt back on PS of 5 to try to wean again, RN called states RR in 25s. Pt placed back on full vent support.

## 2015-06-22 NOTE — H&P (Signed)
PULMONARY / CRITICAL CARE MEDICINE   Name: Brooke Mccullough MRN: [id number] DOB: [birth year]    ADMISSION DATE:  06/11/2015 CONSULTATION DATE: 06/18/2015  REFERRING MD:  Titus Mould  CHIEF COMPLAINT:  Respiratory distress  HISTORY OF PRESENT ILLNESS:  Brooke Mccullough is a 70 yo white female with multiple co-morbid illness such as COPD, HTN, IBS, hyperlipidemia, obesity , bening adrenal tumor, TIA in 2012 and 14, chronic lower back pain , anxiety , depression ,CHF, goiter,holter monitoring. She lives in the rehab type assisted living facility  Admitted resp failure, diastolic HF.  SUBJECTIVE:  Neg 1.2 liters, wide awake, weaning  VITAL SIGNS: BP 156/145 mmHg  Pulse 60  Temp(Src) 100.4 F (38 C) (Oral)  Resp 28  Ht 5\' 5"  (1.651 m)  Wt 76.6 kg (168 lb 14 oz)  BMI 28.10 kg/m2  SpO2 97%  HEMODYNAMICS:    VENTILATOR SETTINGS: Vent Mode:  [-] PSV;CPAP FiO2 (%):  [40 %] 40 % Set Rate:  [30 bmp] 30 bmp Vt Set:  [420 mL] 420 mL PEEP:  [5 cmH20] 5 cmH20 Pressure Support:  [10 cmH20-15 cmH20] 15 cmH20 Plateau Pressure:  [22 cmH20-27 cmH20] 22 cmH20  INTAKE / OUTPUT: I/O last 3 completed shifts: In: [id number] [I.V.:90; NG/GT:695; IV Piggyback:700] Out: [id number] [Urine:4350; Emesis/NG output:30]  PHYSICAL EXAMINATION: General:  Morbidly obese , white female Neuro:  Awake, FC well HEENT: PERRLA,, ett Cardiovascular:  , S1S2, Regular, no MRG noted Lungs: improved BS, coarse Abdomen:  Obese, BS wnl,. nontender  Skin: Intact, no rash, ecchymosis noted  LABS:  BMET  Recent Labs Lab 05/29/2015 1617 06/21/15 0415 06/22/15 0320  NA 132* 138 142  K 4.5 4.0 3.2*  CL 90* 93* 98*  CO2 29 32 31  BUN 47* 46* 43*  CREATININE 1.38* 1.20* 0.91  GLUCOSE 106* 108* 200*    Electrolytes  Recent Labs Lab 06/05/2015 1617 06/21/15 0415 06/22/15 0320  CALCIUM 8.7* 8.8* 9.0  MG  --   --  1.9  PHOS  --   --  3.0    CBC  Recent Labs Lab 05/28/2015 0550 06/21/15 0415 06/21/15 1410   WBC 13.8* 7.0 9.2  HGB 11.6* 9.1* 9.2*  HCT 36.5 28.7* 29.3*  PLT 148* 98* 107*    Coag's No results for input(s): APTT, INR in the last 168 hours.  Sepsis Markers  Recent Labs Lab 06/07/2015 0608  LATICACIDVEN 1.96    ABG  Recent Labs Lab 05/27/2015 0620 06/03/2015 1008 05/27/2015 1341  PHART 7.150* 7.278* 7.386  PCO2ART 96.7* 67.5* 53.6*  PO2ART 49.0* 128.0* 94.6    Liver Enzymes  Recent Labs Lab 06/09/2015 0550  AST 24  ALT 14  ALKPHOS 110  BILITOT 0.5  ALBUMIN 3.0*    Cardiac Enzymes  Recent Labs Lab 06/10/2015 0550  TROPONINI 0.06*    Glucose  Recent Labs Lab 06/21/15 1252 06/21/15 1657 06/21/15 2117 06/22/15 0113 06/22/15 0355 06/22/15 0732  GLUCAP 161* 158* 137* 169* 205* 162*    Imaging Dg Chest Port 1 View  06/22/2015  CLINICAL DATA:  Heart failure. EXAM: PORTABLE CHEST 1 VIEW COMPARISON:  06/21/2015. FINDINGS: Endotracheal tube, NG tube in stable position. Cardiomegaly. Low lung volumes with basilar atelectasis. Left lower lobe infiltrate consistent with pneumonia and/or asymmetric pulmonary edema. No pleural effusion pneumothorax. IMPRESSION: 1. Lines and tubes in stable position. 2. Low lung volumes with basilar atelectasis. Developing left lower lobe infiltrate consistent with pneumonia and/or asymmetric pulmonary edema. Electronically Signed   By: Marcello Moores  Register   On:  06/22/2015 07:14    STUDIES:  None  CULTURES: UA> 2/27 negatiVE  ANTIBIOTICS: none  SIGNIFICANT EVENTS: ED admit>2/27 Intubated>2/27  LINES/TUBES: ett 2/27>>>  DISCUSSION: 15 year white female with multiple co-morbid illness was brought to the [facility] on 06/11/2015  With  Respiratory distress due to  Exacerbation of CHF and Hypertension and pulmonary edema. ASSESSMENT / PLAN:  PULMONARY A: Acute on chronic respiratory failure. COPD R/o PNA rt base P:   DNI Neg balance as tolerated Mental status supports extubation, DNI Wean PS 15 noted, reduce with RT and me  in room, re assess with talking to pt about goals  / anxiety control Control afterload  CARDIOVASCULAR A:  CHF Hypertension likely cauised this pulm edema in setting diastolic HF Hyperlipidemia Obesity Hypoventilation syndrome P:  Diurese  Tele BB maxed, HR 70 Add hydral oral on top prn hydral  RENAL A:    Acute Kidney Injury Hypok, mag P:   bmet in am  kvo Maintain lasix further same dose Mag, k supp  GASTROINTESTINAL A:   Hx IBS GERD P:   protonix tube feedings Lactulose for poopy needs   HEMATOLOGIC A:    Mild Anaemia Drop in plat, has been that low in january P:  Plat rise with lasix Sub q hep  INFECTIOUS A:   Leukocytosis Rt base , r/o asp PNA P: Remains culture neg, more impressed now for atx than infiltrate rt base - narrow Get pct to dc all together  ENDOCRINE A:   Hypothyroidism DM, glu controlled P:   Continue synthroid. SSI coverage  NEUROLOGIC A:   Anxiety Depression Acute encephalopathy resolved Migraine P:   Hold Alprazolam RASS goal: 0- -1 Versed prn fent schedule to avoid WD narcs Narco added   FAMILY   - Inter-disciplinary family meet or Palliative Care meeting due by: 06/24/15  Ccm time 30 min   Lavon Paganini. Titus Mould, MD, [place] Pgr: [place] 06/22/2015 9:31 AM

## 2015-06-22 NOTE — Progress Notes (Signed)
eLink Physician-Brief Progress Note Patient Name: Brooke Mccullough DOB: 04-22-1946 MRN: [id number]   Date of Service  06/22/2015  HPI/Events of Note  Pt agitated despite prn sedation. Will start precedex and follow.   eICU Interventions       Intervention Category Minor Interventions: Agitation / anxiety - evaluation and management  Laker Thompson S. 06/22/2015, 9:05 PM

## 2015-06-22 DEATH — deceased

## 2015-06-23 DIAGNOSIS — I5030 Unspecified diastolic (congestive) heart failure: Secondary | ICD-10-CM

## 2015-06-23 DIAGNOSIS — J810 Acute pulmonary edema: Secondary | ICD-10-CM

## 2015-06-23 LAB — GLUCOSE, CAPILLARY
GLUCOSE-CAPILLARY: 202 mg/dL — AB (ref 65–99)
GLUCOSE-CAPILLARY: 177 mg/dL — AB (ref 65–99)
Glucose-Capillary: 205 mg/dL — ABNORMAL HIGH (ref 65–99)
Glucose-Capillary: 131 mg/dL — ABNORMAL HIGH (ref 65–99)

## 2015-06-23 LAB — BASIC METABOLIC PANEL
ANION GAP: 11 (ref 5–15)
BUN: 48 mg/dL — ABNORMAL HIGH (ref 6–20)
CHLORIDE: 107 mmol/L (ref 101–111)
CO2: 30 mmol/L (ref 22–32)
Calcium: 9 mg/dL (ref 8.9–10.3)
Creatinine, Ser: 0.89 mg/dL (ref 0.44–1.00)
GFR calc Af Amer: >60 mL/min (ref >60)
GFR calc non Af Amer: >60 mL/min (ref >60)
GLUCOSE: 204 mg/dL — AB (ref 65–99)
POTASSIUM: 3.4 mmol/L — AB (ref 3.5–5.1)
Sodium: 148 mmol/L — ABNORMAL HIGH (ref 135–145)

## 2015-06-23 LAB — CULTURE, RESPIRATORY

## 2015-06-23 LAB — CULTURE, RESPIRATORY W GRAM STAIN: Culture: NORMAL

## 2015-06-23 LAB — PROCALCITONIN: Procalcitonin: 3.43 ng/mL

## 2015-06-23 MED ORDER — CHLORHEXIDINE GLUCONATE 0.12 % MT SOLN: 15.0000 mL | Freq: Two times a day (BID) | OROMUCOSAL | Status: DC

## 2015-06-23 MED ORDER — HYDRALAZINE HCL 50 MG PO TABS
50.0000 mg | ORAL_TABLET | Freq: Three times a day (TID) | ORAL | Status: DC
  Administered 2015-06-23 – 2015-06-24 (×2): 50 mg
  Filled 2015-06-23 (×3): qty 1

## 2015-06-23 MED ORDER — ALPRAZOLAM 0.25 MG PO TABS
0.2500 mg | ORAL_TABLET | Freq: Two times a day (BID) | ORAL | Status: DC | PRN
  Administered 2015-06-23 (×2): 0.25 mg via ORAL
  Filled 2015-06-23 (×2): qty 1

## 2015-06-23 MED ORDER — POTASSIUM CHLORIDE 10 MEQ/100ML IV SOLN
10.0000 meq | INTRAVENOUS | Status: AC
  Administered 2015-06-23 – 2015-06-24 (×4): 10 meq via INTRAVENOUS
  Filled 2015-06-23 (×4): qty 100

## 2015-06-23 MED ORDER — POTASSIUM CHLORIDE 10 MEQ/50ML IV SOLN: 10.0000 meq | INTRAVENOUS | Status: DC

## 2015-06-23 MED ORDER — ANTISEPTIC ORAL RINSE SOLUTION (CORINZ)
7.0000 mL | OROMUCOSAL | Status: DC
  Administered 2015-06-23 (×3): 7 mL via OROMUCOSAL

## 2015-06-23 MED ORDER — CHLORHEXIDINE GLUCONATE 0.12 % MT SOLN
15.0000 mL | Freq: Two times a day (BID) | OROMUCOSAL | Status: DC
  Administered 2015-06-23 (×2): 15 mL via OROMUCOSAL
  Filled 2015-06-23: qty 15

## 2015-06-23 MED ORDER — CETYLPYRIDINIUM CHLORIDE 0.05 % MT LIQD
7.0000 mL | Freq: Two times a day (BID) | OROMUCOSAL | Status: DC
  Administered 2015-06-23 (×2): 7 mL via OROMUCOSAL

## 2015-06-23 MED ORDER — FUROSEMIDE 10 MG/ML IJ SOLN
40.0000 mg | Freq: Two times a day (BID) | INTRAMUSCULAR | Status: DC
  Administered 2015-06-23 (×2): 40 mg via INTRAVENOUS
  Filled 2015-06-23 (×2): qty 4

## 2015-06-23 NOTE — Progress Notes (Signed)
[facility] Progress Note Patient Name: Brooke Mccullough DOB: 03-14-1946 MRN: [id number]   Date of Service  06/23/2015  HPI/Events of Note   Recent Labs Lab 06/12/2015 0550 06/13/2015 1617 06/21/15 0415 06/22/15 0320 06/23/15 1104  NA 130* 132* 138 142 148*  K 3.8 4.5 4.0 3.2* 3.4*  CL 88* 90* 93* 98* 107  CO2 32 29 32 31 30  GLUCOSE 167* 106* 108* 200* 204*  BUN 42* 47* 46* 43* 48*  CREATININE 1.12* 1.38* 1.20* 0.91 0.89  CALCIUM 8.8* 8.7* 8.8* 9.0 9.0  MG  --   --   --  1.9  --   PHOS  --   --   --  3.0  --      eICU Interventions  - lasix held - potassium ordered     Intervention Category Intermediate Interventions: Diagnostic test evaluation  BYRUM,ROBERT S. 06/23/2015, 9:31 PM

## 2015-06-23 NOTE — Procedures (Signed)
Extubation Procedure Note  Patient Details:   Name: Emelee Kieper DOB: [birth year] MRN: [id number]   Airway Documentation:   + cuff leak test prior to extubation.  Evaluation  O2 sats: stable throughout Complications: No apparent complications Patient did tolerate procedure well. Bilateral Breath Sounds: Clear, Diminished Suctioning: Oral Yes, pt able to speak.  No stridor noted.  No distress noted.    Lenna Sciara 06/23/2015, 11:41 AM

## 2015-06-23 NOTE — H&P (Signed)
PULMONARY / CRITICAL CARE MEDICINE   Name: Brooke Mccullough MRN: [id number] DOB: 18-Apr-1946    ADMISSION DATE:  06/19/2015 CONSULTATION DATE: 06/06/2015  REFERRING MD:  Titus Mould  CHIEF COMPLAINT:  Respiratory distress  HISTORY OF PRESENT ILLNESS:  Brooke Mccullough is a 70 yo white female with multiple co-morbid illness such as COPD, HTN, IBS, hyperlipidemia, obesity , bening adrenal tumor, TIA in 2012 and 14, chronic lower back pain , anxiety , depression ,CHF, goiter,holter monitoring. She lives in the rehab type assisted living facility  Admitted resp failure, diastolic HF.  SUBJECTIVE:  Agitation, precedex started, 360 cc pos  VITAL SIGNS: BP 174/51 mmHg  Pulse 54  Temp(Src) 100.6 F (38.1 C) (Core (Comment))  Resp 30  Ht '5\' 5"'$  (1.651 m)  Wt 100.7 kg (222 lb 0.1 oz)  BMI 36.94 kg/m2  SpO2 96%  HEMODYNAMICS:    VENTILATOR SETTINGS: Vent Mode:  [-] PRVC FiO2 (%):  [40 %] 40 % Set Rate:  [30 bmp] 30 bmp Vt Set:  [420 mL] 420 mL PEEP:  [5 cmH20] 5 cmH20 Pressure Support:  [15 cmH20] 15 cmH20 Plateau Pressure:  [19 cmH20-25 cmH20] 24 cmH20  INTAKE / OUTPUT: I/O last 3 completed shifts: In: 2882.2 [I.V.:157.2; Other:110; NG/GT:1915; IV Piggyback:700] Out: 4580 [Urine:3275]  PHYSICAL EXAMINATION: General:  Morbidly obese , white female, calmer Neuro:  Awake, calm, FC well HEENT: PERRLA,, ett Cardiovascular:  , S1S2, Regular, no MRG noted Lungs:anterior clear, reduced bases Abdomen:  Obese, BS wnl,. nontender  Skin: Intact, no rash, ecchymosis unchanged  LABS:  BMET  Recent Labs Lab 06/19/2015 1617 06/21/15 0415 06/22/15 0320  NA 132* 138 142  K 4.5 4.0 3.2*  CL 90* 93* 98*  CO2 29 32 31  BUN 47* 46* 43*  CREATININE 1.38* 1.20* 0.91  GLUCOSE 106* 108* 200*    Electrolytes  Recent Labs Lab 06/08/2015 1617 06/21/15 0415 06/22/15 0320  CALCIUM 8.7* 8.8* 9.0  MG  --   --  1.9  PHOS  --   --  3.0    CBC  Recent Labs Lab 06/02/2015 0550  06/21/15 0415 06/21/15 1410  WBC 13.8* 7.0 9.2  HGB 11.6* 9.1* 9.2*  HCT 36.5 28.7* 29.3*  PLT 148* 98* 107*    Coag's No results for input(s): APTT, INR in the last 168 hours.  Sepsis Markers  Recent Labs Lab 05/27/2015 0608 06/22/15 0958 06/23/15 0315  LATICACIDVEN 1.96  --   --   PROCALCITON  --  5.55 3.43    ABG  Recent Labs Lab 06/15/2015 0620 06/13/2015 1008 06/06/2015 1341  PHART 7.150* 7.278* 7.386  PCO2ART 96.7* 67.5* 53.6*  PO2ART 49.0* 128.0* 94.6    Liver Enzymes  Recent Labs Lab 06/01/2015 0550  AST 24  ALT 14  ALKPHOS 110  BILITOT 0.5  ALBUMIN 3.0*    Cardiac Enzymes  Recent Labs Lab 06/21/2015 0550  TROPONINI 0.06*    Glucose  Recent Labs Lab 06/22/15 0732 06/22/15 1201 06/22/15 1630 06/22/15 2029 06/22/15 2333 06/23/15 0404  GLUCAP 162* 188* 163* 155* 178* 202*    Imaging No results found.  STUDIES:  None  CULTURES: UA> 2/27 negatiVE  ANTIBIOTICS: none  SIGNIFICANT EVENTS: ED admit>2/27 Intubated>2/27  LINES/TUBES: ett 2/27>>>  DISCUSSION: 15 year white female with multiple co-morbid illness was brought to the [facility] on 06/07/2015  With  Respiratory distress due to  Exacerbation of CHF and Hypertension and pulmonary edema.  ASSESSMENT / PLAN:  PULMONARY A: Acute on chronic respiratory failure. COPD  R/o PNA rt base P:   DNI Await chem for renal fxn , but would favor still neg balance pcxr in am  Wean aggressive  cpap 5 ps 5, goal 1 hr If extubated and fails then comfort   CARDIOVASCULAR A:  CHF Hypertension likely cauised this pulm edema in setting diastolic HF Hyperlipidemia Obesity Hypoventilation syndrome P:  Diurese continued Tele BB dc as brady from precedex hydral oral prn hydral   RENAL A:    Acute Kidney Injury - resolved P:   bmet now and in am  kvo Maintain lasix restart 40 bid  GASTROINTESTINAL A:   Hx IBS GERD P:  protonix tube feedings Lactulose successful   HEMATOLOGIC A:     Mild Anaemia Drop in plat, has been that low in january P:  Cbc in am  Sub q hep  INFECTIOUS A:   Leukocytosis Rt base , r/o asp PNA P: Pct noted and downward Limit abx to 5 days, ceftriaxone  ENDOCRINE A:   Hypothyroidism DM, glu controlled P:   Continue synthroid. SSI coverage NICE goals met  NEUROLOGIC A:   Anxiety Depression Acute encephalopathy resolved Migraine P:   Add home xanax, lower dose RASS goal: 0 Versed prn fent schedule to avoid WD narcs precedex with WUA   FAMILY   - Inter-disciplinary family meet or Palliative Care meeting due by: 06/24/15  Ccm time 30 min   Lavon Paganini. Titus Mould, MD, [place] Pgr: [place] [facility] 06/23/2015 7:47 AM

## 2015-06-23 NOTE — Progress Notes (Signed)
Spoke with [place]. Pt is agitated with elevated HR(110s) and RR(30s). Advised to continue low dose precedex until pt able to take PO.

## 2015-06-24 ENCOUNTER — Inpatient Hospital Stay (HOSPITAL_COMMUNITY): Payer: Medicare Other

## 2015-06-24 DIAGNOSIS — J449 Chronic obstructive pulmonary disease, unspecified: Secondary | ICD-10-CM

## 2015-06-24 LAB — BASIC METABOLIC PANEL
ANION GAP: 11 (ref 5–15)
BUN: 53 mg/dL — ABNORMAL HIGH (ref 6–20)
CALCIUM: 8.8 mg/dL — AB (ref 8.9–10.3)
CO2: 30 mmol/L (ref 22–32)
CREATININE: 0.92 mg/dL (ref 0.44–1.00)
Chloride: 105 mmol/L (ref 101–111)
Glucose, Bld: 144 mg/dL — ABNORMAL HIGH (ref 65–99)
Potassium: 3.9 mmol/L (ref 3.5–5.1)
Sodium: 146 mmol/L — ABNORMAL HIGH (ref 135–145)

## 2015-06-24 LAB — GLUCOSE, CAPILLARY
GLUCOSE-CAPILLARY: 115 mg/dL — AB (ref 65–99)
GLUCOSE-CAPILLARY: 134 mg/dL — AB (ref 65–99)
GLUCOSE-CAPILLARY: 134 mg/dL — AB (ref 65–99)
Glucose-Capillary: 107 mg/dL — ABNORMAL HIGH (ref 65–99)
Glucose-Capillary: 124 mg/dL — ABNORMAL HIGH (ref 65–99)
Glucose-Capillary: 132 mg/dL — ABNORMAL HIGH (ref 65–99)
Glucose-Capillary: 143 mg/dL — ABNORMAL HIGH (ref 65–99)
Glucose-Capillary: 167 mg/dL — ABNORMAL HIGH (ref 65–99)

## 2015-06-24 LAB — CBC WITH DIFFERENTIAL/PLATELET
Basophils Absolute: 0 10*3/uL (ref 0.0–0.1)
Basophils Relative: 0 %
EOS PCT: 0 %
Eosinophils Absolute: 0 10*3/uL (ref 0.0–0.7)
HEMATOCRIT: 26.2 % — AB (ref 36.0–46.0)
Hemoglobin: 8.2 g/dL — ABNORMAL LOW (ref 12.0–15.0)
LYMPHS ABS: 0.9 10*3/uL (ref 0.7–4.0)
LYMPHS PCT: 13 %
MCH: 25.6 pg — AB (ref 26.0–34.0)
MCHC: 31.3 g/dL (ref 30.0–36.0)
MCV: 81.9 fL (ref 78.0–100.0)
Monocytes Absolute: 0.5 10*3/uL (ref 0.1–1.0)
Monocytes Relative: 8 %
NEUTROS ABS: 5.4 10*3/uL (ref 1.7–7.7)
NEUTROS PCT: 79 %
PLATELETS: 102 10*3/uL — AB (ref 150–400)
RBC: 3.2 MIL/uL — AB (ref 3.87–5.11)
RDW: 15.3 % (ref 11.5–15.5)
WBC: 6.9 10*3/uL (ref 4.0–10.5)

## 2015-06-24 LAB — MAGNESIUM: Magnesium: 2.1 mg/dL (ref 1.7–2.4)

## 2015-06-24 LAB — PHOSPHORUS: Phosphorus: 4.2 mg/dL (ref 2.5–4.6)

## 2015-06-24 LAB — PROCALCITONIN: PROCALCITONIN: 1.41 ng/mL

## 2015-06-24 MED ORDER — HYDRALAZINE HCL 20 MG/ML IJ SOLN
20.0000 mg | Freq: Once | INTRAMUSCULAR | Status: AC
  Administered 2015-06-24: 20 mg via INTRAVENOUS

## 2015-06-24 MED ORDER — CHLORHEXIDINE GLUCONATE 0.12 % MT SOLN
15.0000 mL | Freq: Two times a day (BID) | OROMUCOSAL | Status: DC
  Administered 2015-06-24 – 2015-06-25 (×3): 15 mL via OROMUCOSAL
  Filled 2015-06-24 (×2): qty 15

## 2015-06-24 MED ORDER — POTASSIUM CHLORIDE 10 MEQ/100ML IV SOLN
10.0000 meq | INTRAVENOUS | Status: AC
  Administered 2015-06-24 (×2): 10 meq via INTRAVENOUS
  Filled 2015-06-24 (×2): qty 100

## 2015-06-24 MED ORDER — CETYLPYRIDINIUM CHLORIDE 0.05 % MT LIQD
7.0000 mL | Freq: Two times a day (BID) | OROMUCOSAL | Status: DC
  Administered 2015-06-24 (×2): 7 mL via OROMUCOSAL

## 2015-06-24 MED ORDER — CLONIDINE HCL 0.1 MG/24HR TD PTWK
0.1000 mg | MEDICATED_PATCH | TRANSDERMAL | Status: DC
  Administered 2015-06-24: 0.1 mg via TRANSDERMAL
  Filled 2015-06-24: qty 1

## 2015-06-24 MED ORDER — CLONIDINE HCL 0.2 MG/24HR TD PTWK: 0.2000 mg | MEDICATED_PATCH | TRANSDERMAL | Status: DC

## 2015-06-24 MED ORDER — NITROGLYCERIN 2 % TD OINT
0.5000 [in_us] | TOPICAL_OINTMENT | Freq: Four times a day (QID) | TRANSDERMAL | Status: DC
  Administered 2015-06-24 – 2015-06-26 (×6): 0.5 [in_us] via TOPICAL
  Filled 2015-06-24 (×4): qty 0.5
  Filled 2015-06-24: qty 1
  Filled 2015-06-24 (×2): qty 0.5

## 2015-06-24 MED ORDER — FUROSEMIDE 10 MG/ML IJ SOLN
80.0000 mg | Freq: Two times a day (BID) | INTRAMUSCULAR | Status: AC
  Administered 2015-06-24 – 2015-06-25 (×4): 80 mg via INTRAVENOUS
  Filled 2015-06-24 (×4): qty 8

## 2015-06-24 NOTE — Progress Notes (Signed)
Pt complains of 10/10 pain despite PRN doses of medication given on schedule. Catha Gosselin, MD verbal order to give PRN Morphine now to see how the patient responds. Will administer dose and monitor continue to monitor patient and response to pain intervention.

## 2015-06-24 NOTE — Progress Notes (Signed)
Pt became severely anxious stating she "couldn't breathe". RR in 40s HR in 140s with ectopy. Versed administered and precedex restarted. Remained with pt until vital signs improved and pt is at ease. Pt continues on Bipap. Will continue to monitor closely.

## 2015-06-24 NOTE — Evaluation (Signed)
SLP Cancellation Note  Patient Details Name: Brooke Mccullough MRN: [id number] DOB: [birth year]   Cancelled treatment:        Pt on BiPAP and not medically ready to begin PO intake. SLP will f/u next date.   Titus Mould 06/24/2015, 9:37 AM

## 2015-06-24 NOTE — Progress Notes (Signed)
Attempted pt off Bipap. Pt unable to tolerate, became anxious with decrease in o2 sats to 84. Bipap resumed.

## 2015-06-24 NOTE — Progress Notes (Signed)
Pt calling out into hall for pain medicine. Upon entering room, pt was diaphoretic with labored breathing and elevated HR(130s with ectopy). PRN medication administered with little changes. Dr. Titus Mould made aware and new order for hydralazine administered and Bipap applied. Remained with pt until calmed down. Vital signs improved. Attempted to call family with no response. Will continue to monitor closely.

## 2015-06-24 NOTE — Progress Notes (Signed)
eLink Physician-Brief Progress Note Patient Name: Brooke Mccullough DOB: 10-19-1945 MRN: [id number]   Date of Service  06/24/2015  HPI/Events of Note    eICU Interventions  Increased clonidine patch to 0.2 Added NTG paste 0.5in     Intervention Category Intermediate Interventions: Hypertension - evaluation and management  Derinda Bartus S. 06/24/2015, 4:43 PM

## 2015-06-24 NOTE — H&P (Signed)
PULMONARY / CRITICAL CARE MEDICINE   Name: Brooke Mccullough MRN: [id number] DOB: 02/17/1946    ADMISSION DATE:  06/03/2015 CONSULTATION DATE: 06/06/2015  REFERRING MD:  Feinstein  CHIEF COMPLAINT:  Respiratory distress  HISTORY OF PRESENT ILLNESS:  Brooke Mccullough is a 69 yo white female with multiple co-morbid illness such as COPD, HTN, IBS, hyperlipidemia, obesity , bening adrenal tumor, TIA in 2012 and 14, chronic lower back pain , anxiety , depression ,CHF, goiter,holter monitoring. She lives in the rehab type assisted living facility  Admitted resp failure, diastolic HF.  SUBJECTIVE:  Extubated 3/2, this am now distress  VITAL SIGNS: BP 184/60 mmHg  Pulse 55  Temp(Src) 99.3 F (37.4 C) (Core (Comment))  Resp 27  Ht 5' 5" (1.651 m)  Wt 130 kg (286 lb 9.6 oz)  BMI 47.69 kg/m2  SpO2 95%  HEMODYNAMICS:    VENTILATOR SETTINGS: Vent Mode:  [-]  FiO2 (%):  [50 %] 50 %  INTAKE / OUTPUT: I/O last 3 completed shifts: In: 3046.8 [P.O.:720; I.V.:738.9; Other:140; NG/GT:997.8; IV Piggyback:450] Out: 1940 [Urine:1940]  PHYSICAL EXAMINATION: General:  Morbidly obese , white female, some increase wob Neuro:  Awake, calmer HEENT: PERRLA, jvd Cardiovascular:  , S1S2, Regular, no MRG noted Lungs:anterior crackles Abdomen:  Obese, BS wnl,. nontender  Skin: Intact, no rash, ecchymosis unchanged  LABS:  BMET  Recent Labs Lab 06/22/15 0320 06/23/15 1104 06/24/15 0604  NA 142 148* 146*  K 3.2* 3.4* 3.9  CL 98* 107 105  CO2 31 30 30  BUN 43* 48* 53*  CREATININE 0.91 0.89 0.92  GLUCOSE 200* 204* 144*    Electrolytes  Recent Labs Lab 06/22/15 0320 06/23/15 1104 06/24/15 0604  CALCIUM 9.0 9.0 8.8*  MG 1.9  --  2.1  PHOS 3.0  --  4.2    CBC  Recent Labs Lab 06/21/15 0415 06/21/15 1410 06/24/15 0604  WBC 7.0 9.2 6.9  HGB 9.1* 9.2* 8.2*  HCT 28.7* 29.3* 26.2*  PLT 98* 107* 102*    Coag's No results for input(s): APTT, INR in the last 168  hours.  Sepsis Markers  Recent Labs Lab 06/07/2015 0608 06/22/15 0958 06/23/15 0315 06/24/15 0604  LATICACIDVEN 1.96  --   --   --   PROCALCITON  --  5.55 3.43 1.41    ABG  Recent Labs Lab 06/16/2015 0620 06/01/2015 1008 05/25/2015 1341  PHART 7.150* 7.278* 7.386  PCO2ART 96.7* 67.5* 53.6*  PO2ART 49.0* 128.0* 94.6    Liver Enzymes  Recent Labs Lab 06/10/2015 0550  AST 24  ALT 14  ALKPHOS 110  BILITOT 0.5  ALBUMIN 3.0*    Cardiac Enzymes  Recent Labs Lab 06/02/2015 0550  TROPONINI 0.06*    Glucose  Recent Labs Lab 06/23/15 0758 06/23/15 1121 06/23/15 1557 06/23/15 2356 06/24/15 0435 06/24/15 0804  GLUCAP 205* 177* 131* 134* 107* 167*    Imaging Dg Chest Port 1 View  06/24/2015  CLINICAL DATA:  Pulmonary edema. EXAM: PORTABLE CHEST 1 VIEW COMPARISON:  06/22/2015. FINDINGS: Interim extubation removal of NG tube. Cardiomegaly with pulmonary vascular prominence. Progressive bilateral interstitial prominence. Findings consistent congestive heart failure. Low lung volumes with basilar atelectasis. Small right pleural effusion cannot be excluded. No pneumothorax . IMPRESSION: 1. Interim extubation removal of NG tube. 2. Cardiomegaly with progressive pulmonary interstitial prominence. Small right pleural effusion. Findings consistent with congestive heart failure. 3. Low lung volumes with mild bibasilar atelectasis . Electronically Signed   By: Thomas  Register   On: 06/24/2015 07:15      STUDIES:  None  CULTURES: UA> 2/27 negatiVE  ANTIBIOTICS: none  SIGNIFICANT EVENTS: ED admit>2/27 Intubated>2/27  LINES/TUBES: ett 2/27>>>3/2  DISCUSSION: 7 year white female with multiple co-morbid illness was brought to the [facility] on 06/14/2015  With  Respiratory distress due to  Exacerbation of CHF and Hypertension and pulmonary edema.  ASSESSMENT / PLAN:  PULMONARY A: Acute on chronic respiratory failure. COPD R/o PNA rt base P:   DNI Start BIPAP stat, family  called in  Morphine to limit WOB  / comfort while we continued support Lasix now Will discuss full comfort with family and pt now Treat BP, which would increase edema, terat aggressive  CARDIOVASCULAR A:  CHF Hypertension likely cauised this pulm edema in setting diastolic HF Hyperlipidemia Obesity Hypoventilation syndrome P:  Diurese re add Tele hydral oral maintain as able prn hydral, escalate As nPO, add clonidine patch, is brady mild  RENAL A:    Acute Kidney Injury - resolved Mild hypernatremia P:   bmet am kvo Lasix emergent k supp  GASTROINTESTINAL A:   Hx IBS GERD P:  protonix NPO distress   HEMATOLOGIC A:    Mild Anaemia Drop in plat, has been that low in january P:  Sub q hep  INFECTIOUS A:   Leukocytosis Rt base , r/o asp PNA PCT down P: ceftriaxone to stop date  ENDOCRINE A:   Hypothyroidism DM, glu controlled P:   Continue synthroid. SSI coverage NICE goals met  NEUROLOGIC A:   Anxiety Depression Acute encephalopathy resolved Migraine P:   Prn versed while on nIMV RASS goal: 0 Versed prn fent schedule to avoid WD narcs precedex okay for now, she is NOT a great responder, may dc   FAMILY  Husband on way - Inter-disciplinary family meet or Palliative Care meeting due by: 06/24/15  Ccm time 30 min   Lavon Paganini. Titus Mould, MD, [place] Pgr: [facility] 06/24/2015 8:47 AM

## 2015-06-24 NOTE — Progress Notes (Signed)
Prior to admission, Patient had recently returned to her independent living facility [facility]) after spending about 5 weeks at the facility's SNF [facility]). Patient and husband expressed earlier this week that they would like Patient to return to [facility] upon discharge. This morning, Patient became diaphoretic with labored breathing and elevated HR. BiPap started stat. Patient is a DNR/DNI. Family has been called in to discuss full comfort care. CSW will continue to follow for goals of care and disposition.   Holly Bodily, LCSW [facility] Clinical Social Worker [phone]

## 2015-06-25 LAB — CULTURE, BLOOD (ROUTINE X 2)
Culture: NO GROWTH
Culture: NO GROWTH

## 2015-06-25 LAB — GLUCOSE, CAPILLARY
Glucose-Capillary: 141 mg/dL — ABNORMAL HIGH (ref 65–99)
Glucose-Capillary: 144 mg/dL — ABNORMAL HIGH (ref 65–99)
Glucose-Capillary: 176 mg/dL — ABNORMAL HIGH (ref 65–99)
Glucose-Capillary: 127 mg/dL — ABNORMAL HIGH (ref 65–99)
Glucose-Capillary: 133 mg/dL — ABNORMAL HIGH (ref 65–99)

## 2015-06-25 LAB — BASIC METABOLIC PANEL
ANION GAP: 13 (ref 5–15)
BUN: 51 mg/dL — ABNORMAL HIGH (ref 6–20)
CALCIUM: 9 mg/dL (ref 8.9–10.3)
CHLORIDE: 109 mmol/L (ref 101–111)
CO2: 29 mmol/L (ref 22–32)
Creatinine, Ser: 1.02 mg/dL — ABNORMAL HIGH (ref 0.44–1.00)
GFR calc Af Amer: >60 mL/min (ref >60)
GFR calc non Af Amer: 54 mL/min — ABNORMAL LOW (ref >60)
GLUCOSE: 156 mg/dL — AB (ref 65–99)
Potassium: 3.8 mmol/L (ref 3.5–5.1)
Sodium: 151 mmol/L — ABNORMAL HIGH (ref 135–145)

## 2015-06-25 MED ORDER — CLONIDINE HCL 0.3 MG/24HR TD PTWK: 0.3000 mg | MEDICATED_PATCH | TRANSDERMAL | Status: DC

## 2015-06-25 MED ORDER — PANTOPRAZOLE SODIUM 40 MG PO TBEC
40.0000 mg | DELAYED_RELEASE_TABLET | Freq: Every day | ORAL | Status: DC
  Administered 2015-06-25: 40 mg via ORAL
  Filled 2015-06-25: qty 1

## 2015-06-25 MED ORDER — ACETAMINOPHEN 650 MG RE SUPP
650.0000 mg | Freq: Four times a day (QID) | RECTAL | Status: DC | PRN
  Administered 2015-06-25: 650 mg via RECTAL
  Filled 2015-06-25: qty 1

## 2015-06-25 MED ORDER — MORPHINE BOLUS VIA INFUSION
2.0000 mg | INTRAVENOUS | Status: DC | PRN
  Administered 2015-06-25 – 2015-06-26 (×4): 2 mg via INTRAVENOUS
  Filled 2015-06-25 (×5): qty 2

## 2015-06-25 MED ORDER — LEVOTHYROXINE SODIUM 100 MCG PO TABS
100.0000 ug | ORAL_TABLET | Freq: Every day | ORAL | Status: DC
  Administered 2015-06-26: 100 ug via ORAL
  Filled 2015-06-25: qty 1

## 2015-06-25 MED ORDER — MORPHINE SULFATE (PF) 4 MG/ML IV SOLN: 4.0000 mg | INTRAVENOUS | Status: DC | PRN

## 2015-06-25 MED ORDER — ALPRAZOLAM 0.5 MG PO TABS
0.5000 mg | ORAL_TABLET | Freq: Two times a day (BID) | ORAL | Status: DC | PRN
  Administered 2015-06-25 – 2015-06-26 (×3): 0.5 mg via ORAL
  Filled 2015-06-25 (×3): qty 1

## 2015-06-25 MED ORDER — MORPHINE SULFATE (PF) 4 MG/ML IV SOLN
4.0000 mg | INTRAVENOUS | Status: DC | PRN
  Administered 2015-06-25 (×6): 4 mg via INTRAVENOUS
  Filled 2015-06-25 (×6): qty 1

## 2015-06-25 MED ORDER — DEXTROSE 5 % IV SOLN
INTRAVENOUS | Status: DC
  Administered 2015-06-25: 35 mL via INTRAVENOUS

## 2015-06-25 MED ORDER — HYDROMORPHONE HCL 1 MG/ML IJ SOLN
2.0000 mg | INTRAMUSCULAR | Status: DC | PRN
  Administered 2015-06-25: 1 mg via INTRAVENOUS
  Administered 2015-06-27: 2 mg via INTRAVENOUS
  Filled 2015-06-25 (×3): qty 2

## 2015-06-25 MED ORDER — SODIUM CHLORIDE 0.9 % IV SOLN
2.0000 mg/h | INTRAVENOUS | Status: DC
  Administered 2015-06-25 – 2015-06-26 (×4): 2 mg/h via INTRAVENOUS
  Filled 2015-06-25: qty 10

## 2015-06-25 NOTE — H&P (Signed)
PULMONARY / CRITICAL CARE MEDICINE   Name: Brooke Mccullough MRN: [id number] DOB: October 11, 1945    ADMISSION DATE:  06/06/2015 CONSULTATION DATE: 05/31/2015  REFERRING MD:  Titus Mould  CHIEF COMPLAINT:  Respiratory distress  HISTORY OF PRESENT ILLNESS:  Brooke Mccullough is a 70 yo white female with multiple co-morbid illness such as COPD, HTN, IBS, hyperlipidemia, obesity , bening adrenal tumor, TIA in 2012 and 14, chronic lower back pain , anxiety , depression ,CHF, goiter,holter monitoring. She lives in the rehab type assisted living facility  Admitted resp failure, diastolic HF.  SUBJECTIVE:  Remains on BIPAP Neg 2.8 liters  VITAL SIGNS: BP 173/51 mmHg  Pulse 57  Temp(Src) 99.5 F (37.5 C) (Core (Comment))  Resp 11  Ht _0  (1.651 m)  Wt 98.5 kg (217 lb 2.5 oz)  BMI 36.14 kg/m2  SpO2 99%  HEMODYNAMICS:    VENTILATOR SETTINGS: Vent Mode:  [-]  FiO2 (%):  [40 %] 40 %  INTAKE / OUTPUT: I/O last 3 completed shifts: In: 1992.7 [P.O.:720; I.V.:582.7; Other:40; IV Piggyback:650] Out: 3400 [VUDTH:4388]  PHYSICAL EXAMINATION: General:  Morbidly obese , white female, on NIMV Neuro:  Awake, calmer, can respond well HEENT: PERRLA, jvd about same Cardiovascular:  , S1S2, Regular, no MRG noted Lungs: coarse bases  Abdomen:  Obese, BS wnl,. nontender  Skin: Intact, no rash, ecchymosis unchanged  LABS:  BMET  Recent Labs Lab 06/23/15 1104 06/24/15 0604 06/25/15 0425  NA 148* 146* 151*  K 3.4* 3.9 3.8  CL 107 105 109  CO2 _1 BUN 48* 53* 51*  CREATININE 0.89 0.92 1.02*  GLUCOSE 204* 144* 156*    Electrolytes  Recent Labs Lab 06/22/15 0320 06/23/15 1104 06/24/15 0604 06/25/15 0425  CALCIUM 9.0 9.0 8.8* 9.0  MG 1.9  --  2.1  --   PHOS 3.0  --  4.2  --     CBC  Recent Labs Lab 06/21/15 0415 06/21/15 1410 06/24/15 0604  WBC 7.0 9.2 6.9  HGB 9.1* 9.2* 8.2*  HCT 28.7* 29.3* 26.2*  PLT 98* 107* 102*    Coag's No results for input(s): APTT,  INR in the last 168 hours.  Sepsis Markers  Recent Labs Lab 06/15/2015 0608 06/22/15 0958 06/23/15 0315 06/24/15 0604  LATICACIDVEN 1.96  --   --   --   PROCALCITON  --  5.55 3.43 1.41    ABG  Recent Labs Lab 06/14/2015 0620 06/16/2015 1008 06/02/2015 1341  PHART 7.150* 7.278* 7.386  PCO2ART 96.7* 67.5* 53.6*  PO2ART 49.0* 128.0* 94.6    Liver Enzymes  Recent Labs Lab 05/27/2015 0550  AST 24  ALT 14  ALKPHOS 110  BILITOT 0.5  ALBUMIN 3.0*    Cardiac Enzymes  Recent Labs Lab 06/12/2015 0550  TROPONINI 0.06*    Glucose  Recent Labs Lab 06/24/15 0804 06/24/15 1120 06/24/15 1611 06/24/15 2014 06/24/15 2345 06/25/15 0322  GLUCAP 167* 134* 143* 115* 132* 141*    Imaging No results found.  STUDIES:  None  CULTURES: UA> 2/27 negatiVE  ANTIBIOTICS: none  SIGNIFICANT EVENTS: ED admit>2/27 Intubated>2/27  LINES/TUBES: ett 2/27>>>3/2  DISCUSSION: 28 year white female with multiple co-morbid illness was brought to the [facility] on 06/12/2015  With  Respiratory distress due to  Exacerbation of CHF and Hypertension and pulmonary edema.  ASSESSMENT / PLAN:  PULMONARY A: Acute on chronic respiratory failure. COPD R/o PNA rt base P:   DNI Control BP aggressive, see cvs NIMV has been manitained, will discuss with  pt her wishes to dc this permanent  NIMV is a bridge short term Lasix maintain,she tolerated well Upright Maintain oral HTn control meds  CARDIOVASCULAR A:  CHF Hypertension likely cauised this pulm edema in setting diastolic HF Hyperlipidemia Obesity Hypoventilation syndrome P:  Diurese as renal tolerates, maintain Tele prn hydral, escalate back to oral control a must INcrease clonidine patch NTG  RENAL A:    Acute Kidney Injury - resolved Hypernatremia P:   bmet am kvo Lasix maintain with free water  GASTROINTESTINAL A:   Hx IBS GERD P:  protonix If off NIMV, and no distress, add diet back   HEMATOLOGIC A:    Mild  Anaemia Drop in plat, has been that low in january P:  Sub q hep  INFECTIOUS A:   Leukocytosis Rt base , r/o asp PNA PCT down P: ceftriaxone to stop date,allow to dc  ENDOCRINE A:   Hypothyroidism DM, glu controlled P:   Continue synthroid. SSI coverage NICE goals met daily  NEUROLOGIC A:   Anxiety Depression Acute encephalopathy resolved Migraine Chronic pain increased P:   Prn versed while on nIMV RASS goal: 0 Versed prn fent schedule to avoid WD narcs Consider morphine infusion or more aggressive dilauded precedex is causing brady and not allowing HTN control, dc and NO restart please Increase ativan   FAMILY  Husband and son here, updated - Inter-disciplinary family meet or Palliative Care meeting due by: 06/24/15  Ccm time 35 min   Lavon Paganini. Titus Mould, MD, [place] Pgr: [place] [facility] 06/25/2015 8:01 AM    Update family They wish comfort approach , morhine and drip if needed  Dc nimv no restart and assess her resp status To floor [facility]  Lavon Paganini. Titus Mould, MD, [place] Pgr: [place] [facility]

## 2015-06-25 NOTE — Progress Notes (Signed)
Discontinued all ICU standing orders on transfer to 6N for comfort care.

## 2015-06-26 LAB — PHOSPHORUS: PHOSPHORUS: 3.6 mg/dL (ref 2.5–4.6)

## 2015-06-26 LAB — BASIC METABOLIC PANEL
ANION GAP: 9 (ref 5–15)
BUN: 34 mg/dL — AB (ref 6–20)
CHLORIDE: 107 mmol/L (ref 101–111)
CO2: 31 mmol/L (ref 22–32)
Calcium: 8.6 mg/dL — ABNORMAL LOW (ref 8.9–10.3)
Creatinine, Ser: 0.85 mg/dL (ref 0.44–1.00)
Glucose, Bld: 154 mg/dL — ABNORMAL HIGH (ref 65–99)
POTASSIUM: 3.3 mmol/L — AB (ref 3.5–5.1)
SODIUM: 147 mmol/L — AB (ref 135–145)

## 2015-06-26 LAB — MAGNESIUM: MAGNESIUM: 1.8 mg/dL (ref 1.7–2.4)

## 2015-06-26 LAB — GLUCOSE, CAPILLARY
GLUCOSE-CAPILLARY: 128 mg/dL — AB (ref 65–99)
GLUCOSE-CAPILLARY: 163 mg/dL — AB (ref 65–99)
GLUCOSE-CAPILLARY: 166 mg/dL — AB (ref 65–99)

## 2015-06-26 MED ORDER — MORPHINE BOLUS VIA INFUSION
5.0000 mg | INTRAVENOUS | Status: DC | PRN
  Administered 2015-06-26: 15 mg via INTRAVENOUS
  Administered 2015-06-27: 20 mg via INTRAVENOUS
  Filled 2015-06-26 (×3): qty 20

## 2015-06-26 MED ORDER — GLYCOPYRROLATE 0.2 MG/ML IJ SOLN
0.2000 mg | INTRAMUSCULAR | Status: DC | PRN
  Administered 2015-06-26 – 2015-06-27 (×3): 0.2 mg via INTRAVENOUS
  Filled 2015-06-26 (×3): qty 1

## 2015-06-26 MED ORDER — MIDAZOLAM HCL 2 MG/2ML IJ SOLN
1.0000 mg | INTRAMUSCULAR | Status: DC | PRN
  Administered 2015-06-26: 1 mg via INTRAVENOUS

## 2015-06-26 MED ORDER — MORPHINE SULFATE 25 MG/ML IV SOLN
10.0000 mg/h | INTRAVENOUS | Status: DC
  Administered 2015-06-26 (×6): 10 mg/h via INTRAVENOUS
  Administered 2015-06-27: 15 mg/h via INTRAVENOUS
  Filled 2015-06-26 (×2): qty 10

## 2015-06-26 NOTE — Progress Notes (Signed)
eLink Physician-Brief Progress Note Patient Name: Brooke Mccullough DOB: 10-Mar-1946 MRN: [id number]   Date of Service  06/26/2015  HPI/Events of Note  Oral secretions.  eICU Interventions  Will order Robinul 0.2 mg IV Q 4 hours PRN oral secretions.      Intervention Category Intermediate Interventions: Other:  Lysle Dingwall 06/26/2015, 11:27 PM

## 2015-06-26 NOTE — Progress Notes (Signed)
PULMONARY / CRITICAL CARE MEDICINE   Name: Brooke Mccullough MRN: [id number] DOB: 1945/08/19    ADMISSION DATE:  06/04/2015 CONSULTATION DATE: 05/31/2015  REFERRING MD:  Titus Mould  CHIEF COMPLAINT:  Respiratory distress  HISTORY OF PRESENT ILLNESS:  Brooke Mccullough is a 70 yo white female with multiple co-morbid illness such as COPD, HTN, IBS, hyperlipidemia, obesity , bening adrenal tumor, TIA in 2012 and 14, chronic lower back pain , anxiety , depression ,CHF, goiter,holter monitoring. She lives in the rehab type assisted living facility  Admitted resp failure, diastolic HF.  Morphine drip started, weaker, increased rr  VITAL SIGNS: BP 168/52 mmHg  Pulse 86  Temp(Src) 99 F (37.2 C) (Oral)  Resp 19  Ht 5' 5"  (1.651 m)  Wt 99.5 kg (219 lb 5.7 oz)  BMI 36.50 kg/m2  SpO2 95%  HEMODYNAMICS:    VENTILATOR SETTINGS:    INTAKE / OUTPUT: I/O last 3 completed shifts: In: 1386.1 [P.O.:480; I.V.:906.1] Out: 4425 [XBMWU:1324]  PHYSICAL EXAMINATION: General:  Morbidly obese  Some distress, complaining pain still Neuro:  Lethargic weak HEENT: jvd Cardiovascular:  , S1S2, Regular, no MRG noted Lungs: coarse bases increased Abdomen:  Obese, BS wnl,. nontender  Skin: Intact, no rash, ecchymosis unchanged  LABS:  BMET  Recent Labs Lab 06/24/15 0604 06/25/15 0425 06/26/15 0536  NA 146* 151* 147*  K 3.9 3.8 3.3*  CL 105 109 107  CO2 30 29 31   BUN 53* 51* 34*  CREATININE 0.92 1.02* 0.85  GLUCOSE 144* 156* 154*    Electrolytes  Recent Labs Lab 06/22/15 0320  06/24/15 0604 06/25/15 0425 06/26/15 0536  CALCIUM 9.0  < > 8.8* 9.0 8.6*  MG 1.9  --  2.1  --  1.8  PHOS 3.0  --  4.2  --  3.6  < > = values in this interval not displayed.  CBC  Recent Labs Lab 06/21/15 0415 06/21/15 1410 06/24/15 0604  WBC 7.0 9.2 6.9  HGB 9.1* 9.2* 8.2*  HCT 28.7* 29.3* 26.2*  PLT 98* 107* 102*    Coag's No results for input(s): APTT, INR in the last 168  hours.  Sepsis Markers  Recent Labs Lab 06/15/2015 0608 06/22/15 0958 06/23/15 0315 06/24/15 0604  LATICACIDVEN 1.96  --   --   --   PROCALCITON  --  5.55 3.43 1.41    ABG  Recent Labs Lab 06/07/2015 0620 06/19/2015 1008 06/02/2015 1341  PHART 7.150* 7.278* 7.386  PCO2ART 96.7* 67.5* 53.6*  PO2ART 49.0* 128.0* 94.6    Liver Enzymes  Recent Labs Lab 06/09/2015 0550  AST 24  ALT 14  ALKPHOS 110  BILITOT 0.5  ALBUMIN 3.0*    Cardiac Enzymes  Recent Labs Lab 05/27/2015 0550  TROPONINI 0.06*    Glucose  Recent Labs Lab 06/25/15 1220 06/25/15 1602 06/25/15 2002 06/26/15 0005 06/26/15 0413 06/26/15 0817  GLUCAP 176* 127* 133* 166* 128* 163*    Imaging No results found.  STUDIES:  None  CULTURES: UA> 2/27 negatiVE  ANTIBIOTICS: none  SIGNIFICANT EVENTS: ED admit>2/27 Intubated>2/27  LINES/TUBES: ett 2/27>>>3/2  DISCUSSION: 39 year white female with multiple co-morbid illness was brought to the [facility] on 05/30/2015  With  Respiratory distress due to  Exacerbation of CHF and Hypertension and pulmonary edema.  ASSESSMENT / PLAN A: Acute on chronic respiratory failure. COPD R/o PNA rt base Comfort care Anxiety Depression Acute encephalopathy resolved Migraine Chronic pain increased P:   She is failing Her RR is worse I met with entire  family, she will not survive hospital stay Full comfort indicated and they agree Dc all meds, labs except morphine, xanax, prn versed which can help her slepp according to family rr goal 12-18 and pain free goals, d/w RN   Lavon Paganini. Titus Mould, MD, [place] Pgr: [place] [facility]

## 2015-06-27 MED ORDER — SCOPOLAMINE 1 MG/3DAYS TD PT72
1.0000 | MEDICATED_PATCH | TRANSDERMAL | Status: DC
  Administered 2015-06-27: 1.5 mg via TRANSDERMAL
  Filled 2015-06-27: qty 1

## 2015-06-27 MED ORDER — GLYCOPYRROLATE 0.2 MG/ML IJ SOLN: 0.4000 mg | INTRAMUSCULAR | Status: DC | PRN

## 2015-06-27 MED ORDER — GLYCOPYRROLATE 0.2 MG/ML IJ SOLN
0.2000 mg | INTRAMUSCULAR | Status: DC | PRN
  Administered 2015-06-27: 0.2 mg via INTRAVENOUS
  Filled 2015-06-27: qty 1

## 2015-06-27 MED ORDER — ATROPINE SULFATE 1 % OP SOLN
2.0000 [drp] | Freq: Three times a day (TID) | OPHTHALMIC | Status: DC
  Administered 2015-06-27: 2 [drp] via SUBLINGUAL
  Filled 2015-06-27: qty 5

## 2015-06-29 ENCOUNTER — Telehealth: Payer: Self-pay

## 2015-06-29 ENCOUNTER — Ambulatory Visit: Payer: Medicare Other | Admitting: Family Medicine

## 2015-06-29 NOTE — Telephone Encounter (Signed)
On 06/29/2015 I received a death certificate from [place] and [facility]. (faxed). The death certificate is for cremation. The patient is a patient of Doctor Titus Mould. The death certificate will be taken to Pulmonary Unit for signature. On July 30, 2015 I received the death certificate back from Doctor Halford Chessman who signed the death certificate for Doctor Titus Mould. I got the death certificate ready and called the funeral home to let them know I faxed the death certificate over per their request.

## 2015-07-01 ENCOUNTER — Telehealth: Payer: Self-pay

## 2015-07-01 NOTE — Telephone Encounter (Signed)
On 2015/07/05 I received a death certificate from [place] and [facility] (original). The death certificate is for cremation. The patient is a patient of Doctor Sood. The death certificate will be taken to Pulmonary Unit @ [facility] this pm for signature. On July 05, 2015 I received the death certificate back from Doctor [place]. I got the death certificate ready and mailed the death certificate to the [facility] per their request.

## 2015-07-12 ENCOUNTER — Ambulatory Visit: Payer: Medicare Other | Admitting: Internal Medicine

## 2015-07-23 NOTE — Progress Notes (Signed)
Time of Death 1640.  Verified by Ignacia Palma, RN, and Karren Burly, RN Director.  Titus Mould, MD notified.

## 2015-07-23 NOTE — Discharge Summary (Addendum)
Brooke Mccullough, DECKARD NO.:  [id number]  MEDICAL RECORD NO.:  [id number]  LOCATION:  [id number]                        FACILITY:  [facility]  PHYSICIAN:  Raylene Miyamoto, MD DATE OF BIRTH:  [birth year]  DATE OF ADMISSION:  06/01/2015 DATE OF DISCHARGE:  07/26/15                              DISCHARGE SUMMARY   DEATH SUMMARY  This is a 70 year old, white female, with multiple comorbidities including COPD, hypertension, hyperlipidemia, obesity, benign adrenal tumors, TIAs, chronic lower back pain, anxiety, depression, CHF diastolic in nature which seem to be terminal with a recent admission with diastolic heart failure leading to an evaluation by hospice who was admitted to the hospital on June 20, 2015, in respiratory distress requiring intubation and aggressive diuresis.  The patient underwent aggressive diuresis with hypertension control and then eventually was activated, and after extubation given the nature of her terminal diastolic heart failure, she did have a recurrent pulmonary congestion and edema.  The patient suffered from chronic pain and had declining status over last couple of months, and after continue discussions with the husband and the wonderful family, the daughter [place] arrived as well as son and we decided for full comfort care.  Comfort care was provided, and the patient expired.  FINAL DIAGNOSES UPON DEATH: 1. Terminal acute on chronic diastolic heart failure. 2. Acute respiratory failure. 3. Rule out pneumonia of the right base. 4. Hypertension, contributing to her diastolic heart failure. 5. Acute kidney injury. 6. Irritable bowel syndrome. 7. Anemia, thrombocytopenia. 8. Leukocytosis. 9. Comfort care, palliation. 10. Pressure Ulcer 06/21/15 Stage II. Date first assessed: 06/21/15. Time first assessed:  0200. Location: Buttocks. Location orientation: Medial. Stage 2 - Partial thickness loss of dermis presenting as a shallow open   ulcer with a red, pink wound bed without slough. Present on Admission: Yes."   11. Pneumonia rt lower Lobe   Raylene Miyamoto, MD     DJF/MEDQ  D:  07/19/2015  T:  07/20/2015  Job:  [id number]

## 2015-07-23 NOTE — Care Management Note (Addendum)
Case Management Note  Patient Details  Name: Brooke Mccullough MRN: [id number] Date of Birth: [birth year]  Subjective/Objective:                    Action/Plan:  Await MD assessment this am , possible GIP, residual hospice , UR updated  Expected Discharge Date:                  Expected Discharge Plan:     In-House Referral:     Discharge planning Services     Post Acute Care Choice:    Choice offered to:     DME Arranged:    DME Agency:     HH Arranged:    HH Agency:     Status of Service:  In process, will continue to follow  Medicare Important Message Given:    Date Medicare IM Given:    Medicare IM give by:    Date Additional Medicare IM Given:    Additional Medicare Important Message give by:     If discussed at [place] of Stay Meetings, dates discussed:    Additional Comments:  Marilu Favre, RN 07/10/2015, 10:31 AM

## 2015-07-23 NOTE — Progress Notes (Signed)
Nutrition Brief Note  Chart reviewed. Pt now transitioning to comfort care.  No further nutrition interventions warranted at this time.  Please re-consult as needed.   Sheketa Ende A. Keyera Hattabaugh, RD, LDN, CDE Pager: [phone] After hours Pager: [phone]  

## 2015-07-23 NOTE — Progress Notes (Signed)
Chaplain presented to the patient's room to provide spiritual care support and complete a spiritual care consult.  The patient's family was present at the time of this visit, introduction as Chaplain and family introducted selves with relationship they have with the patient, who at the time of this visit was unresponsive. Chaplain offered words of encouragement, and prayer of peace  And healing were extended to the family. They were informed that Chaplains are available throughout the day if needed please inform the staff. They were appreciative of the spiritual  care support. Chaplain Yaakov Guthrie [phone]

## 2015-07-23 NOTE — Progress Notes (Signed)
266mL Morphine Infusion wasted in sink at [facility] [employer] [facility] with Elon Jester, Agricultural consultant.

## 2015-07-23 NOTE — Progress Notes (Addendum)
PULMONARY / CRITICAL CARE MEDICINE   Name: Brooke Mccullough MRN: [id number] DOB: 09/19/1945    ADMISSION DATE:  06/05/2015 CONSULTATION DATE: 06/14/2015  REFERRING MD:  Titus Mould  CHIEF COMPLAINT:  Respiratory distress  HISTORY OF PRESENT ILLNESS:  Brooke Mccullough is a 69 yo white female with multiple co-morbid illness such as COPD, HTN, IBS, hyperlipidemia, obesity , bening adrenal tumor, TIA in 2012 and 14, chronic lower back pain , anxiety , depression ,CHF, goiter,holter monitoring. She lives in the rehab type assisted living facility  Admitted resp failure, diastolic HF.  INTERVAL: family feels comfort being achieved adequately. Pooling oral secretions despite robinul and scopolamine    VITAL SIGNS: BP 122/39 mmHg  Pulse 99  Temp(Src) 100.7 F (38.2 C) (Oral)  Resp 17  Ht 5\' 5"  (1.651 m)  Wt 99.5 kg (219 lb 5.7 oz)  BMI 36.50 kg/m2  SpO2 93%  HEMODYNAMICS:    VENTILATOR SETTINGS:    INTAKE / OUTPUT: I/O last 3 completed shifts: In: 665 [I.V.:665] Out: 2350 [Urine:2350]  PHYSICAL EXAMINATION: General:  Morbidly obese  No distress, gurgling from pooled secretions. Neuro:  unresponsive HEENT: JVD Cardiovascular:  , S1S2, Regular, no MRG noted Lungs: coarse bases increased Abdomen:  Obese, BS wnl,. nontender  Skin: Intact, no rash, ecchymosis unchanged  LABS:  BMET  Recent Labs Lab 06/24/15 0604 06/25/15 0425 06/26/15 0536  NA 146* 151* 147*  K 3.9 3.8 3.3*  CL 105 109 107  CO2 30 29 31   BUN 53* 51* 34*  CREATININE 0.92 1.02* 0.85  GLUCOSE 144* 156* 154*    Electrolytes  Recent Labs Lab 06/22/15 0320  06/24/15 0604 06/25/15 0425 06/26/15 0536  CALCIUM 9.0  < > 8.8* 9.0 8.6*  MG 1.9  --  2.1  --  1.8  PHOS 3.0  --  4.2  --  3.6  < > = values in this interval not displayed.  CBC  Recent Labs Lab 06/21/15 0415 06/21/15 1410 06/24/15 0604  WBC 7.0 9.2 6.9  HGB 9.1* 9.2* 8.2*  HCT 28.7* 29.3* 26.2*  PLT 98* 107* 102*     Coag's No results for input(s): APTT, INR in the last 168 hours.  Sepsis Markers  Recent Labs Lab 06/22/15 0958 06/23/15 0315 06/24/15 0604  PROCALCITON 5.55 3.43 1.41    ABG No results for input(s): PHART, PCO2ART, PO2ART in the last 168 hours.  Liver Enzymes No results for input(s): AST, ALT, ALKPHOS, BILITOT, ALBUMIN in the last 168 hours.  Cardiac Enzymes No results for input(s): TROPONINI, PROBNP in the last 168 hours.  Glucose  Recent Labs Lab 06/25/15 1220 06/25/15 1602 06/25/15 2002 06/26/15 0005 06/26/15 0413 06/26/15 0817  GLUCAP 176* 127* 133* 166* 128* 163*    Imaging No results found.  STUDIES:  None  CULTURES: UA> 2/27 negatiVE  ANTIBIOTICS: none  SIGNIFICANT EVENTS: ED admit>2/27 Intubated>2/27  LINES/TUBES: ett 2/27>>>3/2  DISCUSSION: 75 year white female with multiple co-morbid illness was brought to the [facility] on 05/27/2015  With  Respiratory distress due to  Exacerbation of CHF and Hypertension and pulmonary edema.  ASSESSMENT / PLAN A: Acute on chronic respiratory failure. COPD R/o PNA rt base Comfort care Anxiety Depression Acute encephalopathy resolved Migraine Chronic pain increased  P:   Continue full comfort per family discussion with DF 3/5 Morphine gtt, xanax, prn versed which can help her sleep according to family Continue Robinul, add scopolamine transdermal, atropine sublingual rr goal 12-18 and pain free goals Good candidate GIP  Georgann Housekeeper,  AGACNP-BC [place] [facility] Pager [phone] or [phone]  July 26, 2015 2:54 PM   STAFF NOTE: Linwood Dibbles, MD FACP have personally reviewed patient's available data, including medical history, events of note, physical examination and test results as part of my evaluation. I have discussed with resident/NP and other care providers such as pharmacist, RN and RRT. In addition, I personally evaluated patient and elicited key findings of:  ronchi, no urine , mild increase distress, severe secretions in upper airway, she has agonal breathing pattern, would increase and bolus morphine, adding agents to thin secretions, dc O2 will prolong suffering, updated daughter , son and husband, wonderful loving family at [place]. Titus Mould, MD, [place] Pgr: [facility] July 26, 2015 3:36 PM

## 2015-07-23 DEATH — deceased
